# Patient Record
Sex: Male | Born: 1970
Health system: Southern US, Community
[De-identification: ages and names within clinical notes are randomized; demographics above are authoritative.]

## PROBLEM LIST (undated history)

## (undated) ENCOUNTER — Emergency Department (HOSPITAL_COMMUNITY): Admission: EM | Payer: No Typology Code available for payment source | Source: Home / Self Care

## (undated) DIAGNOSIS — F1920 Other psychoactive substance dependence, uncomplicated: Secondary | ICD-10-CM

## (undated) DIAGNOSIS — I10 Essential (primary) hypertension: Secondary | ICD-10-CM

## (undated) DIAGNOSIS — Z8719 Personal history of other diseases of the digestive system: Secondary | ICD-10-CM

## (undated) DIAGNOSIS — K219 Gastro-esophageal reflux disease without esophagitis: Secondary | ICD-10-CM

## (undated) HISTORY — PX: HIP ARTHROPLASTY: SHX981

## (undated) HISTORY — PX: APPENDECTOMY: SHX54

## (undated) SURGERY — CLOSED REDUCTION, HIP
Anesthesia: General | Laterality: Right

---

## 2002-03-02 ENCOUNTER — Emergency Department (HOSPITAL_COMMUNITY): Admission: EM | Admit: 2002-03-02 | Discharge: 2002-03-02 | Payer: Self-pay | Admitting: Emergency Medicine

## 2002-03-30 ENCOUNTER — Emergency Department (HOSPITAL_COMMUNITY): Admission: EM | Admit: 2002-03-30 | Discharge: 2002-03-30 | Payer: Self-pay | Admitting: Emergency Medicine

## 2003-01-08 ENCOUNTER — Inpatient Hospital Stay (HOSPITAL_COMMUNITY): Admission: EM | Admit: 2003-01-08 | Discharge: 2003-01-12 | Payer: Self-pay

## 2003-01-14 ENCOUNTER — Emergency Department (HOSPITAL_COMMUNITY): Admission: EM | Admit: 2003-01-14 | Discharge: 2003-01-14 | Payer: Self-pay

## 2003-01-15 ENCOUNTER — Emergency Department (HOSPITAL_COMMUNITY): Admission: EM | Admit: 2003-01-15 | Discharge: 2003-01-15 | Payer: Self-pay | Admitting: *Deleted

## 2003-01-16 ENCOUNTER — Emergency Department (HOSPITAL_COMMUNITY): Admission: EM | Admit: 2003-01-16 | Discharge: 2003-01-16 | Payer: Self-pay | Admitting: Emergency Medicine

## 2003-01-22 ENCOUNTER — Emergency Department (HOSPITAL_COMMUNITY): Admission: EM | Admit: 2003-01-22 | Discharge: 2003-01-22 | Payer: Self-pay | Admitting: Emergency Medicine

## 2003-02-07 ENCOUNTER — Emergency Department (HOSPITAL_COMMUNITY): Admission: EM | Admit: 2003-02-07 | Discharge: 2003-02-07 | Payer: Self-pay | Admitting: Emergency Medicine

## 2003-03-10 HISTORY — PX: JOINT REPLACEMENT: SHX530

## 2003-03-29 ENCOUNTER — Emergency Department (HOSPITAL_COMMUNITY): Admission: EM | Admit: 2003-03-29 | Discharge: 2003-03-29 | Payer: Self-pay

## 2003-04-01 ENCOUNTER — Emergency Department (HOSPITAL_COMMUNITY): Admission: EM | Admit: 2003-04-01 | Discharge: 2003-04-01 | Payer: Self-pay | Admitting: *Deleted

## 2003-04-07 ENCOUNTER — Emergency Department (HOSPITAL_COMMUNITY): Admission: EM | Admit: 2003-04-07 | Discharge: 2003-04-07 | Payer: Self-pay | Admitting: Emergency Medicine

## 2003-12-05 ENCOUNTER — Inpatient Hospital Stay (HOSPITAL_COMMUNITY): Admission: RE | Admit: 2003-12-05 | Discharge: 2003-12-08 | Payer: Self-pay | Admitting: Orthopedic Surgery

## 2004-01-01 ENCOUNTER — Emergency Department (HOSPITAL_COMMUNITY): Admission: EM | Admit: 2004-01-01 | Discharge: 2004-01-01 | Payer: Self-pay | Admitting: Emergency Medicine

## 2004-01-04 ENCOUNTER — Emergency Department (HOSPITAL_COMMUNITY): Admission: EM | Admit: 2004-01-04 | Discharge: 2004-01-04 | Payer: Self-pay | Admitting: Family Medicine

## 2004-01-08 ENCOUNTER — Emergency Department (HOSPITAL_COMMUNITY): Admission: EM | Admit: 2004-01-08 | Discharge: 2004-01-08 | Payer: Self-pay | Admitting: Emergency Medicine

## 2004-01-15 ENCOUNTER — Ambulatory Visit: Payer: Self-pay | Admitting: Sports Medicine

## 2004-02-12 ENCOUNTER — Ambulatory Visit: Payer: Self-pay | Admitting: Family Medicine

## 2004-02-19 ENCOUNTER — Ambulatory Visit: Payer: Self-pay | Admitting: Sports Medicine

## 2004-03-14 ENCOUNTER — Encounter: Admission: RE | Admit: 2004-03-14 | Discharge: 2004-03-14 | Payer: Self-pay | Admitting: Orthopedic Surgery

## 2004-05-11 ENCOUNTER — Emergency Department (HOSPITAL_COMMUNITY): Admission: EM | Admit: 2004-05-11 | Discharge: 2004-05-11 | Payer: Self-pay | Admitting: Family Medicine

## 2004-05-28 ENCOUNTER — Ambulatory Visit: Payer: Self-pay | Admitting: Sports Medicine

## 2004-06-04 ENCOUNTER — Encounter: Admission: RE | Admit: 2004-06-04 | Discharge: 2004-06-04 | Payer: Self-pay | Admitting: Orthopedic Surgery

## 2004-06-17 ENCOUNTER — Encounter
Admission: RE | Admit: 2004-06-17 | Discharge: 2004-09-15 | Payer: Self-pay | Admitting: Physical Medicine & Rehabilitation

## 2004-06-18 ENCOUNTER — Ambulatory Visit: Payer: Self-pay | Admitting: Physical Medicine & Rehabilitation

## 2004-08-14 ENCOUNTER — Ambulatory Visit: Payer: Self-pay | Admitting: Physical Medicine & Rehabilitation

## 2004-08-20 ENCOUNTER — Ambulatory Visit: Payer: Self-pay | Admitting: Sports Medicine

## 2004-09-03 ENCOUNTER — Encounter: Admission: RE | Admit: 2004-09-03 | Discharge: 2004-09-03 | Payer: Self-pay | Admitting: Orthopedic Surgery

## 2004-09-16 ENCOUNTER — Ambulatory Visit: Payer: Self-pay | Admitting: Physical Medicine & Rehabilitation

## 2004-09-16 ENCOUNTER — Encounter
Admission: RE | Admit: 2004-09-16 | Discharge: 2004-12-15 | Payer: Self-pay | Admitting: Physical Medicine & Rehabilitation

## 2004-09-30 ENCOUNTER — Inpatient Hospital Stay (HOSPITAL_COMMUNITY): Admission: RE | Admit: 2004-09-30 | Discharge: 2004-10-02 | Payer: Self-pay | Admitting: Orthopedic Surgery

## 2004-10-19 ENCOUNTER — Encounter: Admission: RE | Admit: 2004-10-19 | Discharge: 2004-10-19 | Payer: Self-pay | Admitting: Orthopedic Surgery

## 2004-10-30 ENCOUNTER — Ambulatory Visit: Payer: Self-pay | Admitting: Physical Medicine & Rehabilitation

## 2004-12-18 ENCOUNTER — Encounter
Admission: RE | Admit: 2004-12-18 | Discharge: 2005-03-18 | Payer: Self-pay | Admitting: Physical Medicine & Rehabilitation

## 2004-12-18 ENCOUNTER — Ambulatory Visit: Payer: Self-pay | Admitting: Physical Medicine & Rehabilitation

## 2005-03-19 ENCOUNTER — Ambulatory Visit: Payer: Self-pay | Admitting: Physical Medicine & Rehabilitation

## 2005-03-19 ENCOUNTER — Encounter
Admission: RE | Admit: 2005-03-19 | Discharge: 2005-06-17 | Payer: Self-pay | Admitting: Physical Medicine & Rehabilitation

## 2005-06-09 ENCOUNTER — Encounter
Admission: RE | Admit: 2005-06-09 | Discharge: 2005-09-07 | Payer: Self-pay | Admitting: Physical Medicine & Rehabilitation

## 2005-06-17 ENCOUNTER — Ambulatory Visit: Payer: Self-pay | Admitting: Family Medicine

## 2005-06-17 ENCOUNTER — Emergency Department (HOSPITAL_COMMUNITY): Admission: EM | Admit: 2005-06-17 | Discharge: 2005-06-17 | Payer: Self-pay | Admitting: Emergency Medicine

## 2005-06-23 ENCOUNTER — Ambulatory Visit: Payer: Self-pay | Admitting: Physical Medicine & Rehabilitation

## 2005-06-23 ENCOUNTER — Ambulatory Visit: Payer: Self-pay | Admitting: Sports Medicine

## 2005-09-18 ENCOUNTER — Ambulatory Visit: Payer: Self-pay | Admitting: Physical Medicine & Rehabilitation

## 2005-09-18 ENCOUNTER — Encounter
Admission: RE | Admit: 2005-09-18 | Discharge: 2005-12-17 | Payer: Self-pay | Admitting: Physical Medicine & Rehabilitation

## 2005-12-11 ENCOUNTER — Ambulatory Visit: Payer: Self-pay | Admitting: Physical Medicine & Rehabilitation

## 2006-03-05 ENCOUNTER — Encounter
Admission: RE | Admit: 2006-03-05 | Discharge: 2006-06-03 | Payer: Self-pay | Admitting: Physical Medicine & Rehabilitation

## 2006-03-05 ENCOUNTER — Ambulatory Visit: Payer: Self-pay | Admitting: Physical Medicine & Rehabilitation

## 2006-05-06 DIAGNOSIS — K219 Gastro-esophageal reflux disease without esophagitis: Secondary | ICD-10-CM | POA: Insufficient documentation

## 2006-05-07 ENCOUNTER — Ambulatory Visit: Payer: Self-pay | Admitting: Family Medicine

## 2006-05-27 ENCOUNTER — Ambulatory Visit: Payer: Self-pay | Admitting: Physical Medicine & Rehabilitation

## 2006-05-27 ENCOUNTER — Encounter
Admission: RE | Admit: 2006-05-27 | Discharge: 2006-08-25 | Payer: Self-pay | Admitting: Physical Medicine & Rehabilitation

## 2006-07-28 ENCOUNTER — Telehealth: Payer: Self-pay | Admitting: *Deleted

## 2006-08-11 ENCOUNTER — Observation Stay (HOSPITAL_COMMUNITY): Admission: EM | Admit: 2006-08-11 | Discharge: 2006-08-12 | Payer: Self-pay | Admitting: Emergency Medicine

## 2006-10-27 ENCOUNTER — Encounter
Admission: RE | Admit: 2006-10-27 | Discharge: 2006-12-07 | Payer: Self-pay | Admitting: Physical Medicine & Rehabilitation

## 2006-10-27 ENCOUNTER — Ambulatory Visit: Payer: Self-pay | Admitting: Physical Medicine & Rehabilitation

## 2007-02-01 ENCOUNTER — Inpatient Hospital Stay (HOSPITAL_COMMUNITY): Admission: EM | Admit: 2007-02-01 | Discharge: 2007-02-01 | Payer: Self-pay | Admitting: Emergency Medicine

## 2007-03-16 ENCOUNTER — Ambulatory Visit: Payer: Self-pay | Admitting: Physical Medicine & Rehabilitation

## 2007-03-16 ENCOUNTER — Encounter
Admission: RE | Admit: 2007-03-16 | Discharge: 2007-05-23 | Payer: Self-pay | Admitting: Physical Medicine & Rehabilitation

## 2007-03-26 ENCOUNTER — Encounter (INDEPENDENT_AMBULATORY_CARE_PROVIDER_SITE_OTHER): Payer: Self-pay | Admitting: Family Medicine

## 2007-04-20 ENCOUNTER — Telehealth (INDEPENDENT_AMBULATORY_CARE_PROVIDER_SITE_OTHER): Payer: Self-pay | Admitting: Family Medicine

## 2007-04-21 ENCOUNTER — Telehealth: Payer: Self-pay | Admitting: *Deleted

## 2007-04-27 ENCOUNTER — Encounter: Payer: Self-pay | Admitting: *Deleted

## 2007-04-29 ENCOUNTER — Inpatient Hospital Stay (HOSPITAL_COMMUNITY): Admission: EM | Admit: 2007-04-29 | Discharge: 2007-05-07 | Payer: Self-pay | Admitting: Emergency Medicine

## 2007-04-30 ENCOUNTER — Ambulatory Visit: Payer: Self-pay | Admitting: Infectious Disease

## 2007-07-12 ENCOUNTER — Encounter
Admission: RE | Admit: 2007-07-12 | Discharge: 2007-07-15 | Payer: Self-pay | Admitting: Physical Medicine & Rehabilitation

## 2007-07-15 ENCOUNTER — Ambulatory Visit: Payer: Self-pay | Admitting: Physical Medicine & Rehabilitation

## 2007-10-06 ENCOUNTER — Encounter
Admission: RE | Admit: 2007-10-06 | Discharge: 2007-10-10 | Payer: Self-pay | Admitting: Physical Medicine & Rehabilitation

## 2007-10-10 ENCOUNTER — Ambulatory Visit: Payer: Self-pay | Admitting: Physical Medicine & Rehabilitation

## 2007-12-29 ENCOUNTER — Encounter
Admission: RE | Admit: 2007-12-29 | Discharge: 2008-01-02 | Payer: Self-pay | Admitting: Physical Medicine & Rehabilitation

## 2008-01-02 ENCOUNTER — Ambulatory Visit: Payer: Self-pay | Admitting: Physical Medicine & Rehabilitation

## 2008-04-03 ENCOUNTER — Encounter
Admission: RE | Admit: 2008-04-03 | Discharge: 2008-04-18 | Payer: Self-pay | Admitting: Physical Medicine & Rehabilitation

## 2008-04-17 ENCOUNTER — Encounter
Admission: RE | Admit: 2008-04-17 | Discharge: 2008-04-20 | Payer: Self-pay | Admitting: Physical Medicine & Rehabilitation

## 2008-04-20 ENCOUNTER — Ambulatory Visit: Payer: Self-pay | Admitting: Physical Medicine & Rehabilitation

## 2008-07-22 ENCOUNTER — Emergency Department (HOSPITAL_COMMUNITY): Admission: EM | Admit: 2008-07-22 | Discharge: 2008-07-22 | Payer: Self-pay | Admitting: Emergency Medicine

## 2009-07-26 ENCOUNTER — Emergency Department (HOSPITAL_COMMUNITY): Admission: EM | Admit: 2009-07-26 | Discharge: 2009-07-26 | Payer: Self-pay | Admitting: Emergency Medicine

## 2010-04-01 ENCOUNTER — Ambulatory Visit (HOSPITAL_COMMUNITY)
Admission: RE | Admit: 2010-04-01 | Discharge: 2010-04-01 | Payer: Medicare Other | Source: Home / Self Care | Attending: Pulmonary Disease | Admitting: Pulmonary Disease

## 2010-04-08 NOTE — Progress Notes (Signed)
Summary: AFTER HOURS - Requesting BP med refill   Medications Added CATAPRES-TTS-3 0.3 MG/24HR PTWK (CLONIDINE HCL) 1 tablet a day for blood pressure - MUST MAKE AND KEEP APPT WITH DR. Jennette Kettle FOR REFILLS.       Prescriptions: CATAPRES-TTS-3 0.3 MG/24HR PTWK (CLONIDINE HCL) 1 tablet a day for blood pressure - MUST MAKE AND KEEP APPT WITH DR. Jennette Kettle FOR REFILLS.  #5 x 0   Entered and Authorized by:   Drue Dun MD   Signed by:   Drue Dun MD on 04/20/2007   Method used:   Electronically sent to ...       [facility] [address] [phone]*       [id number]       [place], [place]  [address]       Ph: [phone]       Fax: [phone]   RxID:   [id number]    Phone Note Call from Patient Call back at Home Phone [phone]   Caller: Patient Summary of Call: Patient calls at 5:30 pm requesting refill on his clonidine.   States he called the office ealier today, but it was not filled and he has been out since yesterday (do not see documentation of this call in chart).  In review of chart, see that patient has not been seen for at least 1 year.  His medication was refilled once after hours already by Dr. Sandria Manly on 1/17 at which time he was instructed he must make an appointment with Dr. Jennette Kettle prior to any further refills.  Patient acts surprised by this at first and insists he has been seen, then after I informed him I have his chart in front of me, he admits that he remembers Dr. Sandria Manly doing this, and cannot recall his last appt or a future scheduled appt.  He has not yet made an appointment according to [facility].  He is very demanding and persistant and states that he can feel his BP increasing already as he has not had any medication since yesterday.  Given potential for rebound HTN, advised patient I will given him 5 pills, but that he MUST call the office tomorrow and make and appointment with Dr. Jennette Kettle as well has have her approve additional refills beyond the 5  pills I am giving.  Also advised the patient that it is practice policy that we do not give refills after hours and that it is inappropriate for him to continue calling after hours to make such requests.  Patient agrees to call the office during regular hours tomorrow to schedule an appt and request further refills from Dr. Jennette Kettle.  Will forward this message to her for review. Initial call taken by: Drue Dun MD,  April 20, 2007 5:51 PM    New/Updated Medications: CATAPRES-TTS-3 0.3 MG/24HR PTWK (CLONIDINE HCL) 1 tablet a day for blood pressure - MUST MAKE AND KEEP APPT WITH DR. Jennette Kettle FOR REFILLS.   Appended Document: AFTER HOURS - Requesting BP med refill  Patient called back to resident lounge number stating he needs the pills rather than the patch.  Called pharmacy to clarify patient may have tablets instead of patch, but only #5 with NO REFILLS.
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
## 2010-04-08 NOTE — Progress Notes (Signed)
Summary: Referral to new doctor  Phone Note Call from Patient Call back at Home Phone [phone]   Summary of Call: pt is wanting a referral to switch to another dr - pt states he isn't being helped the way he would like to be Initial call taken by: Haydee Salter,  Jul 28, 2006 11:58 AM  Follow-up for Phone Call        called pt. pt was very angry. does not like dr.bassett, because she has not helped him at all. he has problems sleeping. needs to [place] a different doctor, [place] [address]. pt has medicaid. i told pt, that i can not just refer him to a different doctor. if he wants his records, he needs to sign a release of information and we will fax his records. pt was very loud and i told him that i don't appreciate him yelling at me. i told him, that i would fwd. his request to our head nurse Follow-up by: Arlyss Repress CMA,,  Jul 28, 2006 3:26 PM  Additional Follow-up for Phone Call Additional follow up Details #1::        Spoke with patient and explained that Dr. Cleophas Dunker would be leaving in July therefore he would be automatically reassigend to another MD.  Patient agreed to stay at [facility].  He then called back the next day and left a message that he had been in contact with a MD in [place] who has agreed to prescribe him the medicaiton that Dr. Cleophas Dunker was refusing to write refills for (Xanax).  I called him back and told him this was okay with [place] and that we would photocopy his medical record for him to take to his new MD.  I asked him to call me back to let me know when he would be dropping by to pick it up. I have not yet heard back from him. Additional Follow-up by: Dennison Nancy RN,  Aug 01, 2006 8:15 PM

## 2010-04-08 NOTE — Miscellaneous (Signed)
Summary: OV/DNKA  Clinical Lists Changes 

## 2010-04-08 NOTE — Miscellaneous (Signed)
Summary: out of meds  Medications Added CATAPRES-TTS-3 0.3 MG/24HR PTWK (CLONIDINE HCL) 1 tablet a day for blood, must make appt with PCP Dr. Jennette Kettle prior to any further refills.       Clinical Lists Changes  Medications: Added new medication of CATAPRES-TTS-3 0.3 MG/24HR PTWK (CLONIDINE HCL) 1 tablet a day for blood, must make appt with PCP Dr. Jennette Kettle prior to any further refills. - Signed Rx of CATAPRES-TTS-3 0.3 MG/24HR PTWK (CLONIDINE HCL) 1 tablet a day for blood, must make appt with PCP Dr. Jennette Kettle prior to any further refills.;  #30 x 0;  Signed;  Entered by: Wilhemina Bonito  MD;  Authorized by: Wilhemina Bonito  MD;  Method used: Electronic   Pt called stating that his Blood pressure medication as well as other medications are now missing.  Pt states he came home from work, and noticed that his top drawer where he keeps his meds, was open, he is missing $500, his Catapress 0.3mg , and other medications that he can't remember by name.  Pt is calling for a refill of his BP medication because he knows that he needs to take it every day.  Explained to pt that he needs to make an appt to see his new pcp, (Dr. Jennette Kettle), prior to any further refills.  Pt was formerly a pt of Dr. Hoy Register and has not been seen since she was at the practice.  Pt agrees with the plan.  [facility] Prescriptions: CATAPRES-TTS-3 0.3 MG/24HR PTWK (CLONIDINE HCL) 1 tablet a day for blood, must make appt with PCP Dr. Jennette Kettle prior to any further refills.  #30 x 0   Entered and Authorized by:   Wilhemina Bonito  MD   Signed by:   Wilhemina Bonito  MD on 03/26/2007   Method used:   Electronically sent to ...       [facility] [address] [phone]*       [address]       [place], [place]  [address]       Ph: [phone]       Fax: [phone]   RxID:   [id number]

## 2010-04-09 ENCOUNTER — Encounter: Payer: Self-pay | Admitting: Pulmonary Disease

## 2010-04-27 ENCOUNTER — Encounter: Payer: Self-pay | Admitting: *Deleted

## 2010-05-04 ENCOUNTER — Emergency Department (HOSPITAL_COMMUNITY)
Admission: EM | Admit: 2010-05-04 | Discharge: 2010-05-04 | Disposition: A | Discharge status: Home / Self Care | Payer: Medicare HMO | Attending: Emergency Medicine | Admitting: Emergency Medicine

## 2010-05-04 ENCOUNTER — Emergency Department (HOSPITAL_COMMUNITY): Payer: Medicare HMO

## 2010-05-04 DIAGNOSIS — IMO0002 Reserved for concepts with insufficient information to code with codable children: Secondary | ICD-10-CM | POA: Insufficient documentation

## 2010-05-04 DIAGNOSIS — Z96649 Presence of unspecified artificial hip joint: Secondary | ICD-10-CM | POA: Insufficient documentation

## 2010-05-04 DIAGNOSIS — S20229A Contusion of unspecified back wall of thorax, initial encounter: Secondary | ICD-10-CM | POA: Insufficient documentation

## 2010-05-04 DIAGNOSIS — Z96659 Presence of unspecified artificial knee joint: Secondary | ICD-10-CM | POA: Insufficient documentation

## 2010-05-04 DIAGNOSIS — Y92009 Unspecified place in unspecified non-institutional (private) residence as the place of occurrence of the external cause: Secondary | ICD-10-CM | POA: Insufficient documentation

## 2010-05-04 DIAGNOSIS — I10 Essential (primary) hypertension: Secondary | ICD-10-CM | POA: Insufficient documentation

## 2010-05-04 DIAGNOSIS — S6390XA Sprain of unspecified part of unspecified wrist and hand, initial encounter: Secondary | ICD-10-CM | POA: Insufficient documentation

## 2010-05-04 DIAGNOSIS — M533 Sacrococcygeal disorders, not elsewhere classified: Secondary | ICD-10-CM | POA: Insufficient documentation

## 2010-05-04 DIAGNOSIS — W108XXA Fall (on) (from) other stairs and steps, initial encounter: Secondary | ICD-10-CM | POA: Insufficient documentation

## 2010-05-14 ENCOUNTER — Emergency Department (HOSPITAL_COMMUNITY)
Admission: EM | Admit: 2010-05-14 | Discharge: 2010-05-14 | Disposition: A | Discharge status: Home / Self Care | Payer: Medicare Other | Attending: Emergency Medicine | Admitting: Emergency Medicine

## 2010-05-14 ENCOUNTER — Emergency Department (HOSPITAL_COMMUNITY): Payer: Medicare Other

## 2010-05-14 DIAGNOSIS — S82009A Unspecified fracture of unspecified patella, initial encounter for closed fracture: Secondary | ICD-10-CM | POA: Insufficient documentation

## 2010-05-14 DIAGNOSIS — I10 Essential (primary) hypertension: Secondary | ICD-10-CM | POA: Insufficient documentation

## 2010-05-14 DIAGNOSIS — M25569 Pain in unspecified knee: Secondary | ICD-10-CM | POA: Insufficient documentation

## 2010-05-19 ENCOUNTER — Other Ambulatory Visit: Payer: Self-pay | Admitting: Family Medicine

## 2010-05-19 ENCOUNTER — Ambulatory Visit
Admission: RE | Admit: 2010-05-19 | Discharge: 2010-05-19 | Disposition: A | Discharge status: Home / Self Care | Payer: Medicare Other | Source: Ambulatory Visit | Attending: Family Medicine | Admitting: Family Medicine

## 2010-05-19 DIAGNOSIS — R609 Edema, unspecified: Secondary | ICD-10-CM
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
## 2010-07-22 NOTE — Op Note (Signed)
NAME:  Todd [name], Todd [name] NO.:  [id number]   MEDICAL RECORD NO.:  [id number]          PATIENT TYPE:  OBV   LOCATION:  0114                         FACILITY:  [facility]   PHYSICIAN:  Mark C. Ophelia Charter, M.D.    DATE OF BIRTH:  January 03, 1971   DATE OF PROCEDURE:  08/11/2006  DATE OF DISCHARGE:                               OPERATIVE REPORT   PRE AND POSTOPERATIVE DIAGNOSIS:  Status post right total hip  arthroplasty revision dislocation.   PROCEDURE:  Closed reduction under anesthesia.   SURGEON:  Annell Greening, MD.   This 40 year old male was sitting on a toilet clipping his toenails had  his knee flexed to his chest, internally rotated, he was trying to clip  small toenails and suddenly felt sharp pain in his hip when he  dislocated.  This hip was maximally flexed with knee to chest internally  rotated.  X-rays in the emergency room demonstrated closed hip  dislocation.  He has a long stem with a lateral strut graft with cables.  No evidence of subsidence or fracture.   PROCEDURE:  At induction of general anesthesia and succinylcholine with  the patient being masked, I got up on the table, put with his leg in 90-  90 position, pulled, did not feel any particular type of clunk.  Internal, external rotation gently was performed.  The leg was then  brought out and distracted some and no clunk was noted.  Leg was brought  all the way out to full extension.  It was noted that the leg lengths  were equal.  Hip easily internally externally rotated.  There was no  shortening.  It was apparent that it probably easily slid in as it was  being placed in 90-90 position.  He was flexed up 90 with some external  rotation a little posterior pressure, hip appeared stable at this point.  It was brought back out to full extension and an x-ray was taken.  X-ray  showed the hip was reduced.  He is placed in knee immobilizer which he  will keep on at all times x 6 weeks unless otherwise instructed  by Dr.  August Saucer.  He will be discharged home office follow-up one week with Dr.  August Saucer.      Mark C. Ophelia Charter, M.D.  Electronically Signed     MCY/MEDQ  D:  08/11/2006  T:  08/12/2006  Job:  [id number]

## 2010-07-22 NOTE — Op Note (Signed)
NAME:  Todd [name], Todd [name] NO.:  [id number]   MEDICAL RECORD NO.:  [id number]          PATIENT TYPE:  INP   LOCATION:  0098                         FACILITY:  [facility]   PHYSICIAN:  Mark C. Ophelia Charter, M.D.    DATE OF BIRTH:  1970/12/22   DATE OF PROCEDURE:  02/01/2007  DATE OF DISCHARGE:  02/01/2007                               OPERATIVE REPORT   PREOPERATIVE DIAGNOSES:  Right closed total hip arthroplasty  dislocation.   POSTOPERATIVE DIAGNOSES:  Right closed total hip arthroplasty  dislocation.   PROCEDURE:  Closed reduction under anesthesia.   SURGEON:  Annell Greening, MD.   ANESTHESIA:  General mask.   ESTIMATED BLOOD LOSS:  None.   This 40 year old male who had total hip arthroplasty done by Dr. Turner Daniels  later had distal bone graft done by Dr. August Saucer and ___dislocated_______  in the past when he was clipping his toenails,  spray this ankle and has  his leg up on a table, stuck a mirror down so he could look at the  lateral ecchymosis of his ankle with flexion at the hip and some  adduction and dislocated the hip once again.  He was n.p.o. since last  night.   PROCEDURE:  After induction of general anesthesia with a muscle  relaxant, the patient is being positioned__________  and with hip in the  90/90 position, patient spine, counter traction applied to the pelvis,  gentle distraction and the hip popped back into position.  There was  stability at 90/90. The hip was brought out into extension, knee  immobilizer applied and a portable x-ray confirmed that the hip was  located.  The patient was never intubated. He was taken to the recovery  room. He will have ankle ASO applied to his ankle for as ankle sprain  and office follow-up in 1 week. The patient was not admitted.      Mark C. Ophelia Charter, M.D.  Electronically Signed     MCY/MEDQ  D:  02/01/2007  T:  02/02/2007  Job:  [id number]

## 2010-07-22 NOTE — Assessment & Plan Note (Signed)
HISTORY OF PRESENT ILLNESS:  [name] returns to clinic today for  followup evaluation.  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  He did suffer a recent hip  dislocation February 01, 2007.  That was placed back into the socket by  Dr. Ophelia Charter in the emergency room.  His primary orthopaedic surgeon Dr.  August Saucer is still considering repeat surgery to try to avoid these repeated  hip dislocations.  He has had two in 2008 with the last one being noted  above.   The patient reports that when he did have his hip dislocation he was  placed on Percocet 10/325 and was using up to four per day.  He reports  that was helping more like his hydrocodone was when he was initially  starting the hydrocodone.  He felt that he gets less benefit from the  hydrocodone the more that he takes it.  He would like to try the  Percocet in place of the hydrocodone if at all possible.   MEDICATIONS:  1. Hydrocodone 10/325 one tablet q.i.d. p.r.n.  2. Toprol XL 25 mg daily.  3. Ultram 50 mg 1-2 tablets p.o. t.i.d. p.r.n. (used occasionally.)  4. Prevacid p.r.n.   REVIEW OF SYSTEMS:  Noncontributory.   PHYSICAL EXAMINATION:  GENERAL APPEARANCE:  Well-appearing, fit, adult  male in mild acute discomfort.  VITAL SIGNS:  Blood pressure 140/64, pulse 61, respiratory rate 18, O2  saturation 98% on room air.  MUSCULOSKELETAL:  He has 5/5 strength throughout the bilateral upper  extremities and 4+/5 strength in the right lower extremity and 5-/5  strength in the left lower extremity.   IMPRESSION:  1. Persistent right thigh pain, status post mid femur fracture with      subsequent bone grafting.  2. History of right total hip replacement with removal of metal      shavings from the right knee August 2006.  3. Repeated hip dislocations per patient.   In the office today, we did decide to switch him to the Percocet.  He  has had a recent refill on the hydrocodone and will  continue to use that  five times per day until he runs out approximately April 08, 2007.  He  then can get the Percocet filled 10/325 one tablet q.i.d. p.r.n. in  place of the hydrocodone since he will be out at that point.  No refills  on the Ultram is necessary in the office today.  Will plan on seeing the  patient in follow up in approximately three to four months time with  refills prior to that appointment as necessary.  He and the surgeon  continue to discuss possible repeat hip surgery.           ______________________________  Ellwood Dense, M.D.     DC/MedQ  D:  03/17/2007 11:47:34  T:  03/17/2007 12:24:06  Job #:  [id number]
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
## 2010-07-22 NOTE — Assessment & Plan Note (Signed)
Todd [name] returns to the clinic today for followup evaluation.  He  reports that he continues to get good benefit from the Ultram and  Percocet used rotating throughout the day.  He does need refills on each  of those as of January 16, 2008.  He continues to do a lot work out of  town up in [place] and in [place] on a periodic basis.  He is back in  town for today's appointment.   MEDICATIONS:  1. Percocet 10/325 one tablet q.i.d. p.r.n.  2. Toprol-XL 50 mg b.i.d.  3. Ultram 50 mg 1-2 tablets p.o. t.i.d. p.r.n.   REVIEW OF SYSTEMS:  Positive for high blood sugar.   PHYSICAL EXAMINATION:  GENERAL:  Well-appearing, middle-aged adult male  in mild-to-no acute discomfort.  VITAL SIGNS:  Blood pressure 136/88 with a pulse of 67, respiratory rate  18, and O2 saturation 100% on room air.  EXTREMITIES:  He ambulates without any assistive device.  He has 5-/5  strength throughout the bilateral upper and lower extremities.   IMPRESSION:  1. Persistent right thigh pain, status post right femur fracture with      subsequent bone grafting.  2. History of right total hip replacement with removal of metal      shavings from the right knee in August 2006.  3. Repeat hip dislocation per the patient.   In the office today, we did refill the patient's Ultram and Percocet as  of January 16, 2008.  We will plan on seeing the patient in followup in  approximately 3 months' time with refills prior to that appointment as  necessary.           ______________________________  Ellwood Dense, M.D.     DC/MedQ  D:  01/02/2008 12:24:34  T:  01/02/2008 22:58:07  Job #:  [id number]
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
## 2010-07-22 NOTE — Assessment & Plan Note (Signed)
Todd [name] returns to clinic today for followup evaluation.  He reports  that he is getting good relief from the Percocet and Ultram use  periodically.  He does need a refill on his Percocet, but has a refill  on the Ultram still remaining.  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  He is unable to work with just one secondary to the economic decline.   MEDICATIONS:  1. Percocet 10/325 one tablet q.i.d. p.r.n.  2. Toprol-XL 50 mg b.i.d.  3. Ultram 50 mg 1-2 tablets p.o. t.i.d. p.r.n.  4. Prevacid p.r.n.   REVIEW OF SYSTEMS:  Positive for low blood sugar.   PHYSICAL EXAMINATION:  GENERAL:  Well-appearing, middle-aged adult male  in mild acute discomfort.  VITAL SIGNS:  Blood pressure 149/80 with a pulse of 67, respiratory rate  22, and O2 saturation 97% on room air.  MUSCULOSKELETAL:  He ambulates without any assist device.  He has 5-/5  strength throughout the bilateral upper and lower extremities.   IMPRESSION:  1. Persistent right thigh pain, status post right femur fracture with      subsequent bone grafting.  2. History of right total hip replacement with removal of metal      shavings from the right knee in August 2006.  3. Repeated hip disk location per the patient.   In the office today, we did refill the patient's Percocet as of October 23, 2007.  No refill on the Ultram is necessary at this time.  We will  plan on seeing him in followup at approximately 3-4 months' time with  refills prior to that appointment as necessary.           ______________________________  Ellwood Dense, M.D.     DC/MedQ  D:  10/10/2007 14:05:16  T:  10/11/2007 05:48:30  Job #:  [id number]
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
## 2010-07-25 NOTE — Assessment & Plan Note (Signed)
INTERVAL HISTORY:  Mr. Kittel returns to clinic today for followup  evaluation.  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  That requires him to get up on a  ladder and be above the ground relying on the strength of his legs.  He has  noted that when he is doing that work along with some general labor that he  occasionally has to do at the work site that he has some increased pain in  the right leg.  He has been restarted on the hydrocodone and is generally  taking approximately three tablets per day.  He is not noting much  improvement with the Ultram that he had been using previously when he was  not working outside the home.  He would like to stop the Ultram and consider  an increase on this hydrocodone.   The patient has been released by Dr. August Saucer with followup on an as-needed  basis.  The patient continues to use his bone stimulator on his right leg  approximately an hour or two in the morning and an hour or two in the  evening.   MEDICATIONS:  1.  Hydrocodone 10/325 one tablet t.i.d. p.r.n. (approximately three per      day).  2.  Toprol XL 25 mg daily.  3.  Ultram 50 mg one to three tablets p.o. t.i.d. p.r.n.   REVIEW OF SYSTEMS:  Noncontributory.   PHYSICAL EXAMINATION:  Well-appearing adult male in mild acute discomfort.  Blood pressure 140/78 with a pulse of 75, respiratory rate 16, and O2  saturation 98% on room air.  He has 5/5 strength throughout the bilateral  upper extremities and 5-/5 strength throughout the right lower extremity.  Left lower extremity strength was 5/5.   IMPRESSION:  1.  Persistent right thigh pain, status post mid femur fracture and      subsequent bone grafting.  2.  History of recent right total hip replacement without difficulty.  3.  Status post removal of metal shavings from the right knee August 2006.   In the office today, we did not refill his Ultram as he does not feel he is  getting any benefit.  We have allowed him to go up on hydrocodone to one  tablet q.i.d.  He has sufficient supply at the present as he just had that  refilled on March 12, 2005.  We will plan on refilling the hydrocodone at  a q.i.d. dosing as he gets closer to the end of that prescription.  We will  plan on seeing the patient in followup in approximately 3 months' time with  refill of medication prior to that appointment as necessary.           ______________________________  Ellwood Dense, M.D.     DC/MedQ  D:  03/20/2005 10:10:06  T:  03/20/2005 21:43:25  Job #:  [id number]

## 2010-07-25 NOTE — Op Note (Signed)
NAME:  JONCARLOS, ATKISON NO.:  [id number]   MEDICAL RECORD NO.:  [id number]          PATIENT TYPE:  INP   LOCATION:  5012                         FACILITY:  [facility]   PHYSICIAN:  Burnard Bunting, M.D.    DATE OF BIRTH:  08/02/1970   DATE OF PROCEDURE:  09/30/2004  DATE OF DISCHARGE:                                 OPERATIVE REPORT   PREOPERATIVE DIAGNOSIS:  Right femoral shaft fracture nonunion.   POSTOPERATIVE DIAGNOSIS:  Right femoral shaft fracture nonunion.   PROCEDURE:  Right femoral shaft nonunion takedown with Symphony bone  grafting and cable grafting.   SURGEON:  Burnard Bunting, MD.   ASSISTANT:  Vanita Panda. Magnus Ivan, MD.   ESTIMATED BLOOD LOSS:  250.   DRAINS:  Hemovac x1.   CULTURES OBTAINED:  One at the nonunion site.   PROCEDURE IN DETAIL:  The patient was brought to the operating room where a  general endotracheal anesthesia was induced.  The patient was placed in the  lateral decubitus position with the left axilla and the left peroneal never  well padded.  The right leg, femur, hip, and foot were prepped with DuraPrep  solution and draped in a sterile manner, and Ioban was used to cover the  operative field.  A lateral approach to the femur was utilized, and the skin  and subcutaneous tissues were sharply divided, the fascia lata was divided,  the vastus lateralis was then mobilized anteriorly, and using a combination  of the electrocautery and blunt dissection, the muscle was elevated off of  the nonunion site.  One bleeding vein in the crossing of the intermuscular  septum required vein ligation.  This was ligated across the fascia with  clips.  Using an osteotome and curettes, the fibrous tissue within the  nonunion site was debrided.  Bleeding bony surfaces were obtained.  Micromotion was detected at the nonunion site.  At this time, the incision  was thoroughly irrigated, the Symphony bone graft was placed at the nonunion  site, the  femoral cortical strut graft was placed with two cables proximal  and two cables distal to the nonunion site.  The cables were tightened.  Fluoroscopic guidance was used to confirm good placement of the cable and  grafting.  At this time, after placement of the bone graft, a platelet-rich  plasma was sprayed on the bone site.  A Hemovac drain was placed but not  connected.  The fascia lata was then reapproximated using a #1 Vicryl  suture.  The skin was reapproximated using interrupted inverted 0 Vicryl  suture and 2-0 Vicryl sutures and skin staples, and a pervious dressing was  placed.  The patient tolerated the procedure well without any immediate  complication.      GSD/MEDQ  D:  09/30/2004  T:  09/30/2004  Job:  [id number]
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
## 2010-07-25 NOTE — Assessment & Plan Note (Signed)
HISTORY OF PRESENT ILLNESS:  [name] returns to the clinic today for  follow up evaluation.  He reports that overall he has been doing reasonably  well.  He was seen by Dr. August Saucer just this week and released back to work as  of December 17, 2004.  He has actually returned to work as of this morning.  He reports that Dr. August Saucer has told him that the bone has healed completely on  the lateral aspect of his right femur, but there is still some sign of the  old fracture present from the medial aspect.  He has asked the patient to  use a bone stimulator on that area twice a day.  The patient has also had  metal shavings removed from his right knee approximately November 03, 2004.  He is still having some pain there and was told that there is nothing else  that the Orthopedist can do for that.  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   The patient has been using his Ultram 50 mg two tablets three times per day.  He did not need a refill in the office today.  He has run out of his  hydrocodone and asked about a refill on that to be used on an as needed  basis.   MEDICATIONS:  1.  Hydrocodone 10/325 one tablet q.i.d. p.r.n. (not using at present).  2.  Toprol XL 25 mg daily.  3.  Ultram 50 mg two tablet p.o. t.i.d. p.r.n.   REVIEW OF SYSTEMS:  Noncontributory.   PHYSICAL EXAMINATION:  GENERAL APPEARANCE:  Well-appearing, fit, adult male  in mild acute discomfort involving his right knee.  VITAL SIGNS:  Blood pressure 136/81, pulse 61, respirations 16, O2  saturation 96% on room air.  EXTREMITIES:  He has 5-/5 strength throughout the bilateral upper and lower  extremities.  Bulk and tone were normal.  Reflexes were 2+ and symmetrical.   On examination of his right knee, he had mild crepitus, though improved with  range of motion.   IMPRESSION:  1.  Persistent right thigh pain, status post mid femur fracture and      subsequent bone grafting.  2.  History of recent  right total hip replacement without difficulty.  3.  Status post removal of metal shavings from right knee joint November 03, 2004.   PLAN:  In the office today, we did refill his hydrocodone at 10/325 one  tablet p.o. t.i.d. p.r.n.  He has sufficient Ultram at this time.  We will  plan on seeing him in follow up in approximately three months time.  He has  been told that he could follow up with Dr. August Saucer on an as needed basis at  this point.           ______________________________  Ellwood Dense, M.D.     DC/MedQ  D:  12/19/2004 10:14:15  T:  12/19/2004 11:39:59  Job #:  [id number]

## 2010-07-25 NOTE — Assessment & Plan Note (Signed)
HISTORY OF PRESENT ILLNESS:  Todd [name] returns to the clinic today for  follow up evaluation.  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  He reports that his employer allows him  to make adjustments secondary to his persistent knee pain.  He is due for  follow up CT scan with Dr. August Saucer and follow up office visit in early July.   The patient has been using his Hydrocodone approximately 0-3 tablets per  day.  He tries to use the Tramadol when he is working so that he is not  lethargic.  He used the Tramadol usually 6 tablets per day.  He needs  refills on both in the office today.   MEDICATIONS:  1.  Hydrocodone 10/325 one tablet t.i.d. p.r.n.  2.  Ultram 50 mg 1-2 tablets p.o. t.i.d. p.r.n.  3.  Toprol XL 25 mg daily.   PHYSICAL EXAMINATION:  GENERAL APPEARANCE:  Well-appearing, fit, adult male  in mild acute discomfort.  VITAL SIGNS:  Blood pressure 162/95 with pulse 79, respirations 16, O2  saturation 97% on room air.  NEUROLOGICAL:  There is 5-/5 strength throughout the left lower extremity  and 4/5 strength throughout the right lower extremity.  Upper extremity  strength was 5/5 throughout.  Bulk and tone were normal.  Reflexes 2+ and  symmetric.   IMPRESSION:  1.  Persistent chronic right thigh pain status post slow healing mid femur      fracture.  2.  History of recent right total hip replacement without difficulty.   PLAN:  In the office today, we did refill the patient's hydrocodone and  Ultram medications.  We have asked for a repeat urine drug screen on him in  the office today as his urine drug screen from Jul 18, 2004, came back  positive for marijuana.  He reports that he has quit marijuana at this  point.  We will see what the urine drug screen looks like today.  We will  plan on seeing him in follow up in the second week of July 2006.       DC/MedQ  D:  08/15/2004 11:46:35  T:  08/15/2004 15:25:01  Job #:  [id number]
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
## 2010-07-25 NOTE — Assessment & Plan Note (Signed)
MEDICAL RECORD NUMBER:  [id number]   DATE OF BIRTH:  Sep 24, 1970   DATE OF EVALUATION:  Jul 18, 2004   INTERVAL HISTORY:  Todd [name] returns to clinic today for followup  evaluation.  He is a 40 year old adult male referred to this office by Dr.  August Saucer for evaluation and treatment of chronic right thigh pain after a mid-  femur fracture.  The patient reports that he is doing much better recently.  He continues to use his bone stimulator twice a day for approximately 20  minutes each and that has helped substantially.  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  He is using his  tramadol 50 mg one to two tablets p.o. t.i.d. p.r.n. and also uses  hydrocodone 10/325 one tablet t.i.d. p.r.n.  He reports that he would like  to decrease that down to twice a day on the hydrocodone and see how he  responds.  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  He is due for followup with Dr. August Saucer in July 2006.  He  presently is using only a single point cane for ambulation.   MEDICATIONS:  1.  Hydrocodone 10/325 one tablet t.i.d. p.r.n.  2.  Ultram 50 mg one to two tablets p.o. t.i.d. p.r.n.  3.  Toprol XL 25 mg once daily.   PHYSICAL EXAMINATION:  Well-appearing fit adult male in mild to no acute  discomfort.  Blood pressure 164/93 with a pulse of 97, respiratory rate 16  and O2 saturation 98% on room air.  He has 5/5 strength throughout the  bilateral upper extremities.  Right lower extremity strength was generally  4+/5.  He does have a limp on the right side during ambulation with or  without the single point cane.   IMPRESSION:  1.  Persistent chronic right thigh pain, status post slow healing mid-femur      fracture.  2.  History of recent right total hip  replacement without difficulty.   In the office today, we did refill his hydrocodone 10/325 but decreased it  down to one tablet p.o. b.i.d. p.r.n. a total of (#60) were prescribed.  No  refill on the tramadol is necessary at this time.  He will call in for a  refill there.  We will plan on seeing the patient in followup in  approximately 4-5 weeks' time.      DC/MedQ  D:  07/18/2004 14:12:04  T:  07/19/2004 22:55:28  Job #:  [id number]
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
## 2010-07-25 NOTE — Assessment & Plan Note (Signed)
REASON FOR VISIT:  Mr. Primm returns to the clinic today for followup  evaluation.  He has switched jobs.  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  He is very busy  between these two jobs.  He continues to get relief from his hydrocodone  use, approximately 4 tablets per day.  He does need a refill on that in the  office today.  He has a sufficient supply of Ultram at this time and will  call in for a refill.   MEDICATIONS:  1. Hydrocodone 10/325 one tablet q.i.d. p.r.n. (approximately #4 per day).  2. Toprol XL 25 mg daily.  3. Ultram 50 mg one to two tablets p.o. t.i.d. p.r.n.   REVIEW OF SYSTEMS:  Noncontributory.   PHYSICAL EXAMINATION:  GENERAL APPEARANCE:  Well-appearing, fit, well-  nourished adult male in mild to no acute discomfort.  VITAL SIGNS:  Blood pressure 143/77, pulse 63, respiratory rate 17 and  oxygen saturation 97% on room air.  MUSCULOSKELETAL:  He has 5/5 strength throughout the bilateral upper and  lower extremities.  He ambulates without any assistive devices.  He  complains of most of the pain being in his right thigh and mid femur region.   IMPRESSION:  1. Persistent right thigh pain, status post mid femur fracture with      subsequent bone grafting.  2. History of recent right total hip replacement.  3. Status post removal of metal shavings from the right knee, August 2006.   PLAN:  In the office today we did refill the patient's hydrocodone.  He will  be calling in for a refill on his Ultram.  We will plan on seeing him in  followup in approximately 3 months time with refills of medications prior to  that appointment as necessary.           ______________________________  Ellwood Dense, M.D.     DC/MedQ  D:  12/14/2005 10:15:38  T:  12/15/2005 10:05:35  Job #:  [id number]

## 2010-07-25 NOTE — Assessment & Plan Note (Signed)
DATE OF EVALUATION:  September 17, 2004.   MEDICAL RECORD NUMBER:  [id number].   [name] returns to the clinic today for followup evaluation.  He is  reporting that he has been back to see Dr. August Saucer last week.  Apparently a CT  scan showed no substantial improvement despite using the bone stimulator for  his right lower extremity.  He and Dr. August Saucer apparently have come to the  conclusion that there has been some healing over of the prior fracture, but  no fusion.  The patient reports that Dr. August Saucer is interested in going back in  and doing bone graft surgery possibly in late July or early August of 2006.  The patient reports that he does take his hydrocodone approximately twice to  three times per day.  He is also working both a full-time and part-time job  at the present time.  He needs a refill of both his hydrocodone and his  Ultram at this point.   MEDICATIONS:  1.  Hydrocodone 10/325 mg one tablet t.i.d. p.r.n.  2.  Ultram 50 mg one to two tablets p.o. t.i.d. p.r.n.  3.  Toprol XL 25 mg daily.   PHYSICAL EXAMINATION:  GENERAL APPEARANCE:  A well-appearing, fit, adult  male in mild acute discomfort.  VITAL SIGNS:  Blood pressure 135/87 with a pulse of 81, respiratory rate 16  and O2 saturation 98% on room air.  EXTREMITIES:  He has 5-/5 strength throughout the left lower extremity and  4/5 throughout the right lower extremity.  Upper extremity strength was 5/5.   IMPRESSION:  1.  Persistent chronic right thigh pain, status post slow healing mid femur      fracture.  2.  History of recent right total hip replacement without difficulty.   In the office today, we did refill the patient's Ultram and hydrocodone.  We  plan on seeing the patient in early September of 2006 after his anticipated  surgery in early August of 2006.  We have asked the patient to have Dr. August Saucer  contact us if other pain medicines are ordered by him after his planned  surgery.       DC/MedQ  D:  09/17/2004  13:09:02  T:  09/17/2004 16:05:34  Job #:  [id number]

## 2010-07-25 NOTE — Assessment & Plan Note (Signed)
[name] returns to the clinic today for follow up evaluation. He reports  that he has had recent bone grafting with Dr. August Saucer, September 30, 2004. He was to  stay on crutches for 1 month and that makes today his 10-month anniversary.  He has stopped crutches today. He also reports that Dr. August Saucer plans an  arthroscopic surgery for his right knee for a possible bone chip removal.  That is scheduled for the next month or so.   At the time of his surgery the patient was treated with a morphine drip and  then placed on OxyContin by Dr. August Saucer. He was on that for about 2 weeks and  then has resumed taking his Hydrocodone which is the medication that we have  him on. Dr. August Saucer apparently gave him a total of #40 tablets as of October 22, 2004, and the patient has been taking one tablet x4 daily. He has been using  that medication but has not been using the Ultram and actually his prior  prescription for the Hydrocodone was x3 per day. There is an increase from  the x3 to x4 daily but he is not using any Ultram at this point.  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 He is not sure  when he will be able to return to that given that he has an upcoming knee  surgery for the chip as mentioned above.   MEDICATIONS:  1.  Hydrocodone 10/325 one tablet q.i.d. p.r.n.  2.  Toprol XL 25 mg daily.   REVIEW OF SYSTEMS:  Noncontributory.   PHYSICAL EXAMINATION:  Well-appearing, fit adult male in mild acute  discomfort. He has a well-healed scar on his right lateral thigh where  recent bone grafting was done. Blood pressure was 129/102 with a pulse of  80, respiratory rate 16 and O2 saturation 99% on room air.   He has 5 minus/5 strength throughout the left lower extremity and 4/5  strength throughout the right lower extremity. Upper extremity strength was  5/5.   IMPRESSION:  1.  Persistent right thigh pain, status post  mid femur fracture and      subsequent bone grafting.  2.  History of recent right total hip replacement without difficulty.   In the office today we did refill his Hydrocodone 10/325 one tablet p.o.  q.i.d. p.r.n. a  total of #120 with no refills. Will plan on seeing him in follow up in  approximately 4 to 6 weeks after his planned arthroscopic surgery in the  next month or so.           ______________________________  Ellwood Dense, M.D.     DC/MedQ  D:  10/31/2004 14:43:18  T:  10/31/2004 20:55:05  Job #:  [id number]

## 2010-07-25 NOTE — Discharge Summary (Signed)
NAME:  Todd [name], Todd [name] NO.:  [id number]   MEDICAL RECORD NO.:  [id number]          PATIENT TYPE:  INP   LOCATION:  1304                         FACILITY:  [facility]   PHYSICIAN:  Michaelyn Barter, M.D. DATE OF BIRTH:  November 16, 1970   DATE OF ADMISSION:  04/29/2007  DATE OF DISCHARGE:  05/07/2007                               DISCHARGE SUMMARY   PRIMARY CARE DOCTOR:  Unassigned.   FINAL DIAGNOSES:  1. Stevens-Johnson syndrome.  2. Hypertension.  3. Constipation.   CONSULTATIONS:  1. Dermatology.  2. Infectious disease, Dr. Paulette Blanch Dam.  3. Dermatology, Dr. Donzetta Starch.   HISTORY OF PRESENT ILLNESS:  Todd [name] is a 40 year old gentleman who  had recently been switched from his usual antihypertensive medication of  Toprol XL about four months previously.  He had recently been taking  clonidine and approximately a week ago he ran out of his clonidine.  Approximately four days later he woke up with a swollen tongue and a  progressing rash involving both of his lower extremities and his  genitals.  [facility].  His clonidine was  discontinued.  He was started on a Medrol tapering Dosepak.  His  symptoms, however, worsened and he decided to come to the hospital for  further evaluation.   PAST MEDICAL HISTORY:  Please see that dictated by Dr. Isidor Holts.   HOSPITAL COURSE:  1. Stevens-Johnson syndrome:  IV Solu-Medrol was provided to the      patient as well as Pepcid.  Because of the pain, the patient was      placed on a Dilaudid PCA pump.  Dermatology was consulted.  Dr.      Yetta Barre saw the patient.  He indicated that the patient was suffering      from erythema multiform likely secondary to clonidine.  He also      indicated that the patient had appeared to respond well to the IV      steroids and stated that a relatively small surface area of      involvement was noted.  In this situation, patients typically do      best with a  slow prednisone taper over four weeks.  After Solu-      Medrol was discontinued, he recommended that the topical care      continue with regards to the patient's lips in addition to Vaseline      gauze.  The skin lesions appeared to be resolving, therefore he      indicated that no additional specific care needed to take place.      Infectious disease was also consulted.  Dr. Daiva Eves saw the      patient.  He also indicated that the patient's condition appeared      to be improving.  He recommended that no prophylactic antibiotics      be given but stated that he would have a low threshold to start      them if he gets worse.  No further recommendations from infectious  disease were given.   Over the course of his hospitalization, the patient indicated that he  was feeling significantly better.  The areas of skin involvement over  his lips and his lower extremities became less painful and the skin  began to clear.  Likewise, the patient's genitals, in particular his  penis, also show some improvement with regard to skin lesions and by the  day of discharge, the patient indicated that he felt significantly  better.  He requested be discharged home.   On the date of discharge, the patient's temperature was 98.1, his heart  rate 62, respirations 18, blood pressure 136/85, O2 saturations 96% on 2  liters.   DISCHARGE MEDICATIONS:  The patient's medications at the time of  discharge consisted of:  1. Pepcid 20 mg q.12 hours.  2. Senokot-S two tablets b.i.d.  3. Percocet 10/325 mg p.o. q.6 hours p.r.n.  4. Toprol XL 50 mg p.o. daily.  5. Prednisone.  The patient was given a tapering dose of prednisone.      Michaelyn Barter, M.D.  Electronically Signed     OR/MEDQ  D:  06/09/2007  T:  06/09/2007  Job:  [id number]

## 2010-07-25 NOTE — Assessment & Plan Note (Signed)
Todd [name] returns to clinic today for followup evaluation.  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  He does need a refill on his  hydrocodone and Ultram in the office in today.   MEDICATIONS:  1. Hydrocodone 10/325 one tablet q.i.d. p.r.n.  2. Toprol XL 25 mg daily.  3. Ultram 50 mg one to two tablets p.o. t.i.d. p.r.n.  4. Prevacid p.r.n.   REVIEW OF SYSTEMS:  Noncontributory.   PHYSICAL EXAMINATION:  Well-appearing thin adult male in mild to no  acute discomfort.  Blood pressure 131/77 with a pulse of 67, respiratory  rate 16, and O2 saturation 97% on room air.  He has 5/5 strength  throughout.  He ambulates without any assistive device.   IMPRESSION:  1. Persistent right thigh pain, status post mid femur fracture with      subsequent bone grafting.  2. History of recent right total hip replacement.  3. Status post removal of metal shavings from the right knee in August      2006.   In the office today we did refill the patient's Ultram and hydrocodone.  Will plan on seeing the patient in followup in approximately 5 months'  time with refills prior to that appointment as necessary.           ______________________________  Ellwood Dense, M.D.     DC/MedQ  D:  06/02/2006 11:14:52  T:  06/02/2006 11:41:21  Job #:  [id number]

## 2010-07-25 NOTE — Assessment & Plan Note (Signed)
DATE OF VISIT:  June 24, 2005.   SUBJECTIVE:  [name] returns to clinic today for follow up evaluation.  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  He continues to get good relief with hydrocodone and Ultram.  He tends to  use the Ultram when he is not working but tends to use the hydrocodone if he  needs increased pain relief.  He does need a refill on both of those in the  office today.   MEDICATIONS:  1.  Hydrocodone 10/325 one tablet q.i.d. p.r.n. (approximately 4 tablets per      day).  2.  Toprol XL 25 mg.  3.  Ultram 50 mg one to two tablets p.o. t.i.d. p.r.n.   REVIEW OF SYSTEMS:  Noncontributory.   PHYSICAL EXAMINATION:  GENERAL APPEARANCE:  Well-appearing, well-nourished,  adult male in no acute discomfort.  VITAL SIGNS:  Blood pressure 161/78 with pulse of 88, respiratory rate 16,  oxygen saturation 97% on room air.  MUSCULOSKELETAL:  He has 5/5 strength throughout the bilateral upper and  lower extremities.   IMPRESSION:  1.  Persistent right thigh pain, status post mid femur fracture and      subsequent bone grafting.  2.  History of recent right total hip replacement without difficulty.  3.  Status post removal of metal shavings from the right knee, August 2006.   PLAN:  In the office today we did refill the patient's Ultram and  hydrocodone.  He will continue to use those only as absolutely necessary.  He continues to be compliant with all prescription medicine use.  Will plan  to see the patient in follow up in approximately three months time.           ______________________________  Ellwood Dense, M.D.     DC/MedQ  D:  06/24/2005 14:17:33  T:  06/25/2005 10:06:47  Job #:  [id number]
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
## 2010-09-21 ENCOUNTER — Emergency Department (HOSPITAL_COMMUNITY)
Admission: EM | Admit: 2010-09-21 | Discharge: 2010-09-21 | Disposition: A | Discharge status: Home / Self Care | Payer: Medicare HMO | Attending: Emergency Medicine | Admitting: Emergency Medicine

## 2010-09-21 DIAGNOSIS — G8929 Other chronic pain: Secondary | ICD-10-CM | POA: Insufficient documentation

## 2010-09-21 DIAGNOSIS — M549 Dorsalgia, unspecified: Secondary | ICD-10-CM | POA: Insufficient documentation

## 2010-09-21 DIAGNOSIS — I10 Essential (primary) hypertension: Secondary | ICD-10-CM | POA: Insufficient documentation

## 2010-11-28 LAB — CBC
HCT: 35.2 — ABNORMAL LOW
HCT: 35.6 — ABNORMAL LOW
HCT: 37.1 — ABNORMAL LOW
HCT: 38.4 — ABNORMAL LOW
Hemoglobin: 12.2 — ABNORMAL LOW
Hemoglobin: 12.2 — ABNORMAL LOW
Hemoglobin: 12.7 — ABNORMAL LOW
Hemoglobin: 13.2
MCHC: 34.3
MCHC: 34.3
MCHC: 34.4
MCHC: 34.8
MCV: 88.6
MCV: 89.5
MCV: 89.8
MCV: 90.7
Platelets: 300
Platelets: 306
Platelets: 310
Platelets: 335
RBC: 3.96 — ABNORMAL LOW
RBC: 3.97 — ABNORMAL LOW
RBC: 4.09 — ABNORMAL LOW
RBC: 4.28
RDW: 12.8
RDW: 13
RDW: 13
RDW: 13
WBC: 10.7 — ABNORMAL HIGH
WBC: 11.5 — ABNORMAL HIGH
WBC: 5.5
WBC: 8.1

## 2010-11-28 LAB — COMPREHENSIVE METABOLIC PANEL
ALT: 15
AST: 11
Albumin: 3.7
Alkaline Phosphatase: 60
BUN: 12
CO2: 28
Calcium: 9
Chloride: 107
Creatinine, Ser: 0.8
GFR calc Af Amer: >60
GFR calc non Af Amer: >60
Glucose, Bld: 97
Potassium: 4.7
Sodium: 141
Total Bilirubin: 0.7
Total Protein: 6.8

## 2010-11-28 LAB — BASIC METABOLIC PANEL
BUN: 7
BUN: 9
CO2: 26
CO2: 28
Calcium: 8.2 — ABNORMAL LOW
Calcium: 8.8
Chloride: 102
Chloride: 98
Creatinine, Ser: 0.69
Creatinine, Ser: 0.75
GFR calc Af Amer: >60
GFR calc Af Amer: >60
GFR calc non Af Amer: >60
GFR calc non Af Amer: >60
Glucose, Bld: 145 — ABNORMAL HIGH
Glucose, Bld: 176 — ABNORMAL HIGH
Potassium: 3.6
Potassium: 4.6
Sodium: 129 — ABNORMAL LOW
Sodium: 135

## 2010-11-28 LAB — DIFFERENTIAL
Basophils Absolute: 0
Basophils Relative: 0
Eosinophils Absolute: 0
Eosinophils Relative: 1
Lymphocytes Relative: 15
Lymphs Abs: 0.8
Monocytes Absolute: 0.8
Monocytes Relative: 14 — ABNORMAL HIGH
Neutro Abs: 3.8
Neutrophils Relative %: 70

## 2010-11-28 LAB — PROTIME-INR
INR: 1.1
Prothrombin Time: 14.2

## 2010-11-28 LAB — MYCOPLASMA PNEUMONIAE ANTIBODY, IGM
Mycoplasma pneumo IgM: 113 Units/mL (ref <770)
Mycoplasma pneumo IgM: 184 Units/mL (ref <770)

## 2010-11-28 LAB — CULTURE, BLOOD (ROUTINE X 2)
Culture: NO GROWTH
Culture: NO GROWTH

## 2010-11-28 LAB — RPR: RPR Ser Ql: NONREACTIVE

## 2010-11-28 LAB — HSV(HERPES SMPLX)ABS-I+II(IGG+IGM)-BLD
Herpes Simplex Vrs I + II Ab, IgG: 0.2 IV
Herpes Simplex Vrs I&II-IgM Ab (EIA): NOT DETECTED

## 2010-11-28 LAB — APTT: aPTT: 26

## 2010-11-28 LAB — HIV ANTIBODY (ROUTINE TESTING W REFLEX): HIV: NONREACTIVE

## 2010-12-16 LAB — DIFFERENTIAL
Basophils Absolute: 0
Basophils Relative: 0
Eosinophils Absolute: 0.3
Eosinophils Relative: 7 — ABNORMAL HIGH
Lymphocytes Relative: 22
Lymphs Abs: 0.9
Monocytes Absolute: 0.7
Monocytes Relative: 16 — ABNORMAL HIGH
Neutro Abs: 2.4
Neutrophils Relative %: 56

## 2010-12-16 LAB — CBC
HCT: 32.6 — ABNORMAL LOW
Hemoglobin: 11.3 — ABNORMAL LOW
MCHC: 34.7
MCV: 88.9
Platelets: 236
RBC: 3.66 — ABNORMAL LOW
RDW: 13.5
WBC: 4.3

## 2010-12-16 LAB — BASIC METABOLIC PANEL
BUN: 18
CO2: 28
Calcium: 8.6
Chloride: 106
Creatinine, Ser: 0.53
GFR calc Af Amer: >60
GFR calc non Af Amer: >60
Glucose, Bld: 113 — ABNORMAL HIGH
Potassium: 4.3
Sodium: 137

## 2010-12-17 ENCOUNTER — Observation Stay (HOSPITAL_COMMUNITY)
Admission: EM | Admit: 2010-12-17 | Discharge: 2010-12-18 | Disposition: A | Discharge status: Home / Self Care | Payer: Medicare Other | Attending: Specialist | Admitting: Specialist

## 2010-12-17 ENCOUNTER — Emergency Department (HOSPITAL_COMMUNITY): Payer: Medicare Other

## 2010-12-17 DIAGNOSIS — Z96649 Presence of unspecified artificial hip joint: Secondary | ICD-10-CM | POA: Insufficient documentation

## 2010-12-17 DIAGNOSIS — X500XXA Overexertion from strenuous movement or load, initial encounter: Secondary | ICD-10-CM | POA: Insufficient documentation

## 2010-12-17 DIAGNOSIS — T84029A Dislocation of unspecified internal joint prosthesis, initial encounter: Principal | ICD-10-CM | POA: Insufficient documentation

## 2010-12-17 DIAGNOSIS — Y92009 Unspecified place in unspecified non-institutional (private) residence as the place of occurrence of the external cause: Secondary | ICD-10-CM | POA: Insufficient documentation

## 2010-12-17 LAB — DIFFERENTIAL
Band Neutrophils: 0 % (ref 0–10)
Basophils Absolute: 0 10*3/uL (ref 0.0–0.1)
Basophils Relative: 0 % (ref 0–1)
Blasts: 0 %
Eosinophils Absolute: 0.1 10*3/uL (ref 0.0–0.7)
Eosinophils Relative: 1 % (ref 0–5)
Lymphocytes Relative: 12 % (ref 12–46)
Lymphs Abs: 1.4 10*3/uL (ref 0.7–4.0)
Metamyelocytes Relative: 0 %
Monocytes Absolute: 0.7 10*3/uL (ref 0.1–1.0)
Monocytes Relative: 6 % (ref 3–12)
Myelocytes: 0 %
Neutro Abs: 9.6 10*3/uL — ABNORMAL HIGH (ref 1.7–7.7)
Neutrophils Relative %: 81 % — ABNORMAL HIGH (ref 43–77)
Promyelocytes Absolute: 0 %
nRBC: 0 /100 WBC

## 2010-12-17 LAB — COMPREHENSIVE METABOLIC PANEL
ALT: 30 U/L (ref 0–53)
AST: 27 U/L (ref 0–37)
Albumin: 4.1 g/dL (ref 3.5–5.2)
Alkaline Phosphatase: 83 U/L (ref 39–117)
BUN: 19 mg/dL (ref 6–23)
CO2: 30 mEq/L (ref 19–32)
Calcium: 9.5 mg/dL (ref 8.4–10.5)
Chloride: 103 mEq/L (ref 96–112)
Creatinine, Ser: 0.88 mg/dL (ref 0.50–1.35)
GFR calc Af Amer: >90 mL/min (ref >90)
GFR calc non Af Amer: >90 mL/min (ref >90)
Glucose, Bld: 107 mg/dL — ABNORMAL HIGH (ref 70–99)
Potassium: 4.1 mEq/L (ref 3.5–5.1)
Sodium: 139 mEq/L (ref 135–145)
Total Bilirubin: 0.3 mg/dL (ref 0.3–1.2)
Total Protein: 7.5 g/dL (ref 6.0–8.3)

## 2010-12-17 LAB — CBC
HCT: 40.4 % (ref 39.0–52.0)
Hemoglobin: 13.6 g/dL (ref 13.0–17.0)
MCH: 28.7 pg (ref 26.0–34.0)
MCHC: 33.7 g/dL (ref 30.0–36.0)
MCV: 85.2 fL (ref 78.0–100.0)
Platelets: 295 10*3/uL (ref 150–400)
RBC: 4.74 MIL/uL (ref 4.22–5.81)
RDW: 13.9 % (ref 11.5–15.5)
WBC: 11.8 10*3/uL — ABNORMAL HIGH (ref 4.0–10.5)

## 2010-12-19 NOTE — Op Note (Signed)
NAME:  Todd [name], LOCKRIDGE NO.:  [id number]  MEDICAL RECORD NO.:  [id number]  LOCATION:  5007                         FACILITY:  [facility]  PHYSICIAN:  Kerrin Champagne, M.D.   DATE OF BIRTH:  1971/02/16  DATE OF PROCEDURE:  12/17/2010 DATE OF DISCHARGE:                              OPERATIVE REPORT   PREOPERATIVE DIAGNOSIS:  Right total hip replacement, posterior dislocation.  POSTOPERATIVE DIAGNOSIS:  Right total hip replacement, posterior dislocation.  PROCEDURE:  Right dislocated total hip replacement, close reduction under general anesthetic.  SURGEON:  Kerrin Champagne, MD  ASSISTANT:  None.  ANESTHESIA:  General via oral esophageal obturator, Dr. Bedelia Person.  FINDINGS:  Right posterior superior hip dislocation.  SPECIMENS:  None.  ESTIMATED BLOOD LOSS:  0 mL.  COMPLICATIONS:  None.  The patient returned to the PACU in good condition.  HISTORY OF PRESENT ILLNESS:  A 40 year old male who has undergone previous relocation of the dislocated right total hip replacement x3 by Dr. August Saucer and Dr. Ophelia Charter.  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  He had injuries in 2004, underwent hip surgery by Dr. Turner Daniels in 2005.  Eventually he was the different doctor to take care of him, Dr. August Saucer assumed his care.  He has had a relocation of his right total hip arthroplasty 3 times now and returns now for 4th dislocations right total hip.  Initial dislocation occurred while he was in the [facility] and was treated at [facility]  The patient was seen this evening after having been sitting in a chair and flexing his right hip in order to place the socks on his right leg.  The patient understands the risks and benefits of the procedure.  He has signed informed consent.  Discussion regarding possible need  for open relocation if close reduction is not successful.  He has signed both the consent as well as the preoperative blood transfusion if necessary permit.  DESCRIPTION OF PROCEDURE:  The patient was seen in the preoperative holding area, had marking of the right hip with a right anterior lateral thigh with an X in my initials.  Received standard preoperative antibiotics of Ancef 1 g.  He was then transported to the OR after discussion and all the questions were answered.  The patient was transferred to the OR room #2 at [facility] was used for this procedure. Transported via stretcher and he then underwent induction of general anesthesia while on his stretcher and standard time-out protocol was carried out identifying the patient, site of the procedure, any blood loss expected, length and duration of the procedure.  With the induction of anesthesia and the placement of the obturator then after okay with anesthesia, then standing on a foot stool over the right side of this patient's stretcher with a stretcher lock then the right hip was brought into 90/90 position with abduction.  Counter traction was placed over this patient's right anterior-superior iliac spine, holding the pelvis to the stretcher and then traction was placed on  the femur with the knee flexed and the hip was able to be relocated with audible palpable clunk. The hip was then brought into extension.  Foot noted to be in correct degree of external rotation.  The leg length is slightly longer on the right side compared with the left perhaps a half inch to three quarters of an inch.  The right leg was then placed into a knee immobilizer with the posterior stays removed, well padded.  A plain AP radiograph of the pelvis was then obtained documenting a relocation of the right total hip arthroplasty in good position alignment.  The patient then was reactivated and extubated, returned to the recovery room in  satisfactory condition.  All instrument and sponge counts were correct. Postoperative care of this patient will be admitted overnight observation, discharge in the morning under 23-hour observation.  At the end of procedure, all instrument and sponge counts were correct.  This was a closed procedure.  Complications none.  The patient was returned to PACU in satisfactory condition.     Kerrin Champagne, M.D.     JEN/MEDQ  D:  12/17/2010  T:  12/18/2010  Job:  [id number]  Electronically Signed by Vira Browns M.D. on 12/19/2010 05:44:15 PM

## 2010-12-25 LAB — DIFFERENTIAL
Basophils Absolute: 0
Basophils Relative: 0
Eosinophils Absolute: 0.1
Eosinophils Relative: 2
Lymphocytes Relative: 12
Lymphs Abs: 0.7
Monocytes Absolute: 0.6
Monocytes Relative: 10
Neutro Abs: 4.4
Neutrophils Relative %: 75

## 2010-12-25 LAB — CBC
HCT: 36.3 — ABNORMAL LOW
Hemoglobin: 12.4 — ABNORMAL LOW
MCHC: 34.1
MCV: 89.7
Platelets: 287
RBC: 4.05 — ABNORMAL LOW
RDW: 13.1
WBC: 5.9

## 2010-12-25 LAB — BASIC METABOLIC PANEL
BUN: 14
CO2: 27
Calcium: 8.9
Chloride: 110
Creatinine, Ser: 0.75
GFR calc Af Amer: >60
GFR calc non Af Amer: >60
Glucose, Bld: 105 — ABNORMAL HIGH
Potassium: 4.1
Sodium: 138

## 2011-05-19 DIAGNOSIS — G4700 Insomnia, unspecified: Secondary | ICD-10-CM | POA: Diagnosis not present

## 2011-05-19 DIAGNOSIS — E785 Hyperlipidemia, unspecified: Secondary | ICD-10-CM | POA: Diagnosis not present

## 2011-05-19 DIAGNOSIS — I10 Essential (primary) hypertension: Secondary | ICD-10-CM | POA: Diagnosis not present

## 2011-05-19 DIAGNOSIS — M25559 Pain in unspecified hip: Secondary | ICD-10-CM | POA: Diagnosis not present

## 2011-08-11 DIAGNOSIS — G4700 Insomnia, unspecified: Secondary | ICD-10-CM | POA: Diagnosis not present

## 2011-08-11 DIAGNOSIS — M25559 Pain in unspecified hip: Secondary | ICD-10-CM | POA: Diagnosis not present

## 2011-08-11 DIAGNOSIS — I10 Essential (primary) hypertension: Secondary | ICD-10-CM | POA: Diagnosis not present

## 2011-08-11 DIAGNOSIS — E785 Hyperlipidemia, unspecified: Secondary | ICD-10-CM | POA: Diagnosis not present

## 2012-01-22 DIAGNOSIS — F341 Dysthymic disorder: Secondary | ICD-10-CM | POA: Diagnosis not present

## 2012-01-22 DIAGNOSIS — I10 Essential (primary) hypertension: Secondary | ICD-10-CM | POA: Diagnosis not present

## 2012-01-22 DIAGNOSIS — Z125 Encounter for screening for malignant neoplasm of prostate: Secondary | ICD-10-CM | POA: Diagnosis not present

## 2012-01-22 DIAGNOSIS — K219 Gastro-esophageal reflux disease without esophagitis: Secondary | ICD-10-CM | POA: Diagnosis not present

## 2012-01-22 DIAGNOSIS — D649 Anemia, unspecified: Secondary | ICD-10-CM | POA: Diagnosis not present

## 2012-01-22 DIAGNOSIS — E785 Hyperlipidemia, unspecified: Secondary | ICD-10-CM | POA: Diagnosis not present

## 2012-01-22 DIAGNOSIS — R5381 Other malaise: Secondary | ICD-10-CM | POA: Diagnosis not present

## 2012-01-22 DIAGNOSIS — M25559 Pain in unspecified hip: Secondary | ICD-10-CM | POA: Diagnosis not present

## 2012-01-22 DIAGNOSIS — R946 Abnormal results of thyroid function studies: Secondary | ICD-10-CM | POA: Diagnosis not present

## 2012-01-22 DIAGNOSIS — R5383 Other fatigue: Secondary | ICD-10-CM | POA: Diagnosis not present

## 2012-02-03 DIAGNOSIS — K921 Melena: Secondary | ICD-10-CM | POA: Diagnosis not present

## 2012-02-03 DIAGNOSIS — K5900 Constipation, unspecified: Secondary | ICD-10-CM | POA: Diagnosis not present

## 2012-02-03 DIAGNOSIS — D509 Iron deficiency anemia, unspecified: Secondary | ICD-10-CM | POA: Diagnosis not present

## 2012-02-15 ENCOUNTER — Encounter (HOSPITAL_COMMUNITY): Payer: Self-pay | Admitting: Pharmacy Technician

## 2012-02-15 ENCOUNTER — Encounter (HOSPITAL_COMMUNITY): Payer: Self-pay | Admitting: *Deleted

## 2012-02-15 NOTE — Pre-Procedure Instructions (Signed)
Your procedure is scheduled on:Wednesday, February 17, 2012 Report to [facility] Admitting WU:[id number] Call this number if you have problems morning of your procedure:[phone]  Follow all bowel prep instructions per your doctor's orders.  Do not eat or drink anything after midnight the night before your procedure. You may brush your teeth, rinse out your mouth, but no water, no food, no chewing gum, no mints, no candies, no chewing tobacco.     Take these medicines the morning of your procedure with A SIP OF WATER:Catapress, Metoprolol and Priolsec   Please make arrangements for a responsible person to drive you home after the procedure. You cannot go home by cab/taxi. We recommend you have someone with you at home the first 24 hours after your procedure. Driver for procedure is Marine scientist  LEAVE ALL VALUABLES, JEWELRY, BILLFOLD AT HOME.  NO DENTURES, CONTACT LENSES ALLOWED IN THE ENDOSCOPY ROOM.   YOU MAY WEAR DEODORANT, PLEASE REMOVE ALL JEWELRY, WATCHES RINGS, BODY PIERCINGS AND LEAVE AT HOME.   WOMEN: NO MAKE-UP, LOTIONS PERFUMES

## 2012-02-17 ENCOUNTER — Encounter (HOSPITAL_COMMUNITY): Payer: Self-pay | Admitting: Anesthesiology

## 2012-02-17 ENCOUNTER — Encounter (HOSPITAL_COMMUNITY)
Admission: RE | Disposition: A | Discharge status: Home / Self Care | Payer: Self-pay | Source: Ambulatory Visit | Attending: Gastroenterology

## 2012-02-17 ENCOUNTER — Ambulatory Visit (HOSPITAL_COMMUNITY): Payer: Medicare Other | Admitting: Anesthesiology

## 2012-02-17 ENCOUNTER — Encounter (HOSPITAL_COMMUNITY): Payer: Self-pay | Admitting: Gastroenterology

## 2012-02-17 ENCOUNTER — Ambulatory Visit (HOSPITAL_COMMUNITY)
Admission: RE | Admit: 2012-02-17 | Discharge: 2012-02-17 | Disposition: A | Discharge status: Home / Self Care | Payer: Medicare Other | Source: Ambulatory Visit | Attending: Gastroenterology | Admitting: Gastroenterology

## 2012-02-17 DIAGNOSIS — K449 Diaphragmatic hernia without obstruction or gangrene: Secondary | ICD-10-CM | POA: Diagnosis not present

## 2012-02-17 DIAGNOSIS — K921 Melena: Secondary | ICD-10-CM | POA: Diagnosis not present

## 2012-02-17 DIAGNOSIS — I10 Essential (primary) hypertension: Secondary | ICD-10-CM | POA: Diagnosis not present

## 2012-02-17 DIAGNOSIS — K440 Diaphragmatic hernia with obstruction, without gangrene: Secondary | ICD-10-CM | POA: Diagnosis not present

## 2012-02-17 DIAGNOSIS — K219 Gastro-esophageal reflux disease without esophagitis: Secondary | ICD-10-CM | POA: Diagnosis not present

## 2012-02-17 DIAGNOSIS — D509 Iron deficiency anemia, unspecified: Secondary | ICD-10-CM | POA: Diagnosis not present

## 2012-02-17 DIAGNOSIS — K5900 Constipation, unspecified: Secondary | ICD-10-CM | POA: Diagnosis not present

## 2012-02-17 DIAGNOSIS — K648 Other hemorrhoids: Secondary | ICD-10-CM | POA: Insufficient documentation

## 2012-02-17 DIAGNOSIS — D649 Anemia, unspecified: Secondary | ICD-10-CM | POA: Diagnosis not present

## 2012-02-17 HISTORY — PX: COLONOSCOPY WITH PROPOFOL: SHX5780

## 2012-02-17 HISTORY — DX: Gastro-esophageal reflux disease without esophagitis: K21.9

## 2012-02-17 HISTORY — DX: Essential (primary) hypertension: I10

## 2012-02-17 HISTORY — DX: Personal history of other diseases of the digestive system: Z87.19

## 2012-02-17 HISTORY — PX: ESOPHAGOGASTRODUODENOSCOPY (EGD) WITH PROPOFOL: SHX5813

## 2012-02-17 SURGERY — ESOPHAGOGASTRODUODENOSCOPY (EGD) WITH PROPOFOL
Anesthesia: Monitor Anesthesia Care

## 2012-02-17 MED ORDER — SODIUM CHLORIDE 0.9 % IV SOLN: INTRAVENOUS | Status: DC

## 2012-02-17 MED ORDER — BUTAMBEN-TETRACAINE-BENZOCAINE 2-2-14 % EX AERO
INHALATION_SPRAY | CUTANEOUS | Status: DC | PRN
  Administered 2012-02-17: 2 via TOPICAL

## 2012-02-17 MED ORDER — LACTATED RINGERS IV SOLN
INTRAVENOUS | Status: DC
  Administered 2012-02-17: 1000 mL via INTRAVENOUS

## 2012-02-17 MED ORDER — MIDAZOLAM HCL 5 MG/5ML IJ SOLN
INTRAMUSCULAR | Status: DC | PRN
  Administered 2012-02-17: 2 mg via INTRAVENOUS

## 2012-02-17 MED ORDER — PROPOFOL 10 MG/ML IV EMUL
INTRAVENOUS | Status: DC | PRN
  Administered 2012-02-17: 100 ug/kg/min via INTRAVENOUS

## 2012-02-17 MED ORDER — FENTANYL CITRATE 0.05 MG/ML IJ SOLN
INTRAMUSCULAR | Status: DC | PRN
  Administered 2012-02-17: 100 ug via INTRAVENOUS

## 2012-02-17 MED ORDER — KETAMINE HCL 10 MG/ML IJ SOLN
INTRAMUSCULAR | Status: DC | PRN
  Administered 2012-02-17: 10 mg via INTRAVENOUS
  Administered 2012-02-17: 20 mg via INTRAVENOUS

## 2012-02-17 SURGICAL SUPPLY — 24 items

## 2012-02-17 NOTE — Transfer of Care (Signed)
Immediate Anesthesia Transfer of Care Note  Patient: Todd [name]  Procedure(s) Performed: Procedure(s) (LRB) with comments: ESOPHAGOGASTRODUODENOSCOPY (EGD) WITH PROPOFOL (N/A) COLONOSCOPY WITH PROPOFOL (N/A)  Patient Location: PACU  Anesthesia Type:MAC  Level of Consciousness: awake, alert  and oriented  Airway & Oxygen Therapy: Patient Spontanous Breathing and Patient connected to nasal cannula oxygen  Post-op Assessment: Report given to PACU RN and Post -op Vital signs reviewed and stable  Post vital signs: Reviewed and stable  Complications: No apparent anesthesia complications

## 2012-02-17 NOTE — Op Note (Signed)
[facility] [address] [place], [address]   COLONOSCOPY PROCEDURE REPORT  PATIENT: Todd [name], Todd [name]  MR#: [id number] BIRTHDATE: 10/03/1970 , 41  yrs. old GENDER: Male ENDOSCOPIST: Willis Modena, MD REFERRED UU:[id number] Cliffton Asters, M.D. PROCEDURE DATE:  02/17/2012 PROCEDURE:   Colonoscopy, diagnostic ASA CLASS:   Class II INDICATIONS:iron-deficiency anemia, constipation, blood in stool. MEDICATIONS: MAC sedation, administered by CRNA  DESCRIPTION OF PROCEDURE:   After the risks benefits and alternatives of the procedure were thoroughly explained, informed consent was obtained.  A digital rectal exam revealed no abnormalities of the rectum.   The Pentax Ped Colon [id number] endoscope was introduced through the anus and advanced to the cecum, which was identified by both the appendix and ileocecal valve. No adverse events experienced.   The quality of the prep was good.  The instrument was then slowly withdrawn as the colon was fully examined.   Findings:  Digital rectal exam was normal.  Prep quality fair in ascending colon (diminutive or subtle lesions could have been missed ), but good elsewhere.  No polyps, masses, vascular ectasias, or inflammatory changes were seen.  Retroflexed views of rectum showed internal hemorrhoids, otherwise normal. Withdrawal time was about 10 minutes     .  The scope was withdrawn and the procedure completed.  ENDOSCOPIC IMPRESSION:     As above.  Suspect internal hemorrhoids are source of intermittent hematochezia.  No source of iron-deficiency anemia was identified.  RECOMMENDATIONS:     1.  Watch for potential complications of procedure. 2.  High fiber diet. 3.  Topical therapies (e.g., Preparation-H) as needed for hemorrhoidal bleeding. 4.  Restart oral iron therapy. 5.  If anemia persists despite oral iron repletion, consider CT enterography abd/pelvis versus capsule endoscopy. 6.  Follow-up with [facility] in 2-3  months.  eSigned:  Willis Modena, MD 02/17/2012 2:44 PM   cc:

## 2012-02-17 NOTE — H&P (Signed)
Patient interval history reviewed.  Patient examined again.  There has been no change from documented H/P dated 02/03/12 (scanned into chart from our office) except as documented above.  Assessment:  1.  Iron-deficiency anemia. 2.  Constipation. 3.  Blood in stool.  Plan:  1.  Upper endoscopy (EGD). 2.  Risks (bleeding, infection, bowel perforation that could require surgery, sedation-related changes in cardiopulmonary systems), benefits (identification and possible treatment of source of symptoms, exclusion of certain causes of symptoms), and alternatives (watchful waiting, radiographic imaging studies, empiric medical treatment) of upper endoscopy (EGD) were explained to patient/girlfriend in detail and he wishes to proceed. 3.  Colonoscopy. 4.  Risks (bleeding, infection, bowel perforation that could require surgery, sedation-related changes in cardiopulmonary systems), benefits (identification and possible treatment of source of symptoms, exclusion of certain causes of symptoms), and alternatives (watchful waiting, radiographic imaging studies, empiric medical treatment) of colonoscopy were explained to patient/girlfriend in detail and he wishes to proceed.

## 2012-02-17 NOTE — Anesthesia Preprocedure Evaluation (Signed)
Anesthesia Evaluation  Patient identified by MRN, date of birth, ID band Patient awake    Reviewed: Allergy & Precautions, H&P , NPO status , Patient's Chart, lab work & pertinent test results  Airway Mallampati: II TM Distance: >3 FB Neck ROM: Full    Dental No notable dental hx.    Pulmonary neg pulmonary ROS,  breath sounds clear to auscultation  Pulmonary exam normal       Cardiovascular hypertension, Pt. on medications negative cardio ROS  Rhythm:Regular Rate:Normal     Neuro/Psych negative neurological ROS  negative psych ROS   GI/Hepatic negative GI ROS, Neg liver ROS, hiatal hernia, GERD-  Medicated and Controlled,  Endo/Other  negative endocrine ROS  Renal/GU negative Renal ROS  negative genitourinary   Musculoskeletal negative musculoskeletal ROS (+)   Abdominal   Peds negative pediatric ROS (+)  Hematology negative hematology ROS (+)   Anesthesia Other Findings   Reproductive/Obstetrics negative OB ROS                           Anesthesia Physical Anesthesia Plan  ASA: II  Anesthesia Plan: MAC   Post-op Pain Management:    Induction:   Airway Management Planned: Simple Face Mask and Nasal Cannula  Additional Equipment:   Intra-op Plan:   Post-operative Plan:   Informed Consent: I have reviewed the patients History and Physical, chart, labs and discussed the procedure including the risks, benefits and alternatives for the proposed anesthesia with the patient or authorized representative who has indicated his/her understanding and acceptance.   Dental advisory given  Plan Discussed with: CRNA  Anesthesia Plan Comments:         Anesthesia Quick Evaluation

## 2012-02-17 NOTE — Op Note (Signed)
[facility] [address] [place], [address]   ENDOSCOPY PROCEDURE REPORT  PATIENT: Todd [name], Todd [name]  MR#: [id number] BIRTHDATE: 1970/03/20 , 41  yrs. old GENDER: Male ENDOSCOPIST: Willis Modena, MD REFERRED BY:  Laurann Montana, M.D. PROCEDURE DATE:  02/17/2012 PROCEDURE:  EGD w/ biopsy ASA CLASS:     Class II INDICATIONS:  iron-deficiency anemia, blood in stool. MEDICATIONS: MAC sedation, administered by CRNA and Cetacaine spray x 2 TOPICAL ANESTHETIC: Cetacaine Spray  DESCRIPTION OF PROCEDURE: After the risks benefits and alternatives of the procedure were thoroughly explained, informed consent was obtained.  The Pentax Gastroscope [id number] endoscope was introduced through the mouth and advanced to the second portion of the duodenum. Without limitations.  The instrument was slowly withdrawn as the mucosa was fully examined.    Findings:  Paraesophageal-appearing hernia noted.  Otherwise normal stomach, pylorus, and duodenum to the second portion.  Random biopsies of normal-appearing duodenum were taken with cold forceps to evaluate for celiac disease.           The scope was then withdrawn from the patient and the procedure completed.  ENDOSCOPIC IMPRESSION:     As above.  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  RECOMMENDATIONS:     1.  Watch for potential complications of procedure. 2.  Await biopsy results. 3.  Proceed with colonoscopy today.  eSigned:  Willis Modena, MD 02/17/2012 2:38 PM   CC:

## 2012-02-17 NOTE — OR Nursing (Signed)
Pt positioned for endo procedure. In LLD position, pillow placed in between legs as pt is s/p RT hip replacement.  Angelique Blonder, RN

## 2012-02-17 NOTE — Anesthesia Postprocedure Evaluation (Signed)
  Anesthesia Post-op Note  Patient: Todd [name]  Procedure(s) Performed: Procedure(s) (LRB): ESOPHAGOGASTRODUODENOSCOPY (EGD) WITH PROPOFOL (N/A) COLONOSCOPY WITH PROPOFOL (N/A)  Patient Location: PACU  Anesthesia Type: MAC  Level of Consciousness: awake and alert   Airway and Oxygen Therapy: Patient Spontanous Breathing  Post-op Pain: mild  Post-op Assessment: Post-op Vital signs reviewed, Patient's Cardiovascular Status Stable, Respiratory Function Stable, Patent Airway and No signs of Nausea or vomiting  Last Vitals:  Filed Vitals:   02/17/12 1504  BP: 124/82  Pulse:   Temp:   Resp: 13    Post-op Vital Signs: stable   Complications: No apparent anesthesia complications

## 2012-02-18 ENCOUNTER — Encounter (HOSPITAL_COMMUNITY): Payer: Self-pay | Admitting: Gastroenterology

## 2012-02-22 DIAGNOSIS — Z23 Encounter for immunization: Secondary | ICD-10-CM | POA: Diagnosis not present

## 2012-02-22 DIAGNOSIS — M25559 Pain in unspecified hip: Secondary | ICD-10-CM | POA: Diagnosis not present

## 2012-02-22 DIAGNOSIS — D509 Iron deficiency anemia, unspecified: Secondary | ICD-10-CM | POA: Diagnosis not present

## 2012-02-22 DIAGNOSIS — F411 Generalized anxiety disorder: Secondary | ICD-10-CM | POA: Diagnosis not present

## 2012-04-13 DIAGNOSIS — M25559 Pain in unspecified hip: Secondary | ICD-10-CM | POA: Diagnosis not present

## 2012-04-13 DIAGNOSIS — Z471 Aftercare following joint replacement surgery: Secondary | ICD-10-CM | POA: Diagnosis not present

## 2012-04-13 DIAGNOSIS — Z96649 Presence of unspecified artificial hip joint: Secondary | ICD-10-CM | POA: Diagnosis not present

## 2012-04-13 DIAGNOSIS — M948X9 Other specified disorders of cartilage, unspecified sites: Secondary | ICD-10-CM | POA: Diagnosis not present

## 2012-04-13 DIAGNOSIS — M1610 Unilateral primary osteoarthritis, unspecified hip: Secondary | ICD-10-CM | POA: Diagnosis not present

## 2012-04-14 DIAGNOSIS — F341 Dysthymic disorder: Secondary | ICD-10-CM | POA: Diagnosis not present

## 2012-04-14 DIAGNOSIS — M25559 Pain in unspecified hip: Secondary | ICD-10-CM | POA: Diagnosis not present

## 2012-04-14 DIAGNOSIS — D509 Iron deficiency anemia, unspecified: Secondary | ICD-10-CM | POA: Diagnosis not present

## 2012-04-14 DIAGNOSIS — G479 Sleep disorder, unspecified: Secondary | ICD-10-CM | POA: Diagnosis not present

## 2012-04-19 DIAGNOSIS — T8489XA Other specified complication of internal orthopedic prosthetic devices, implants and grafts, initial encounter: Secondary | ICD-10-CM | POA: Diagnosis not present

## 2012-04-20 DIAGNOSIS — T8489XA Other specified complication of internal orthopedic prosthetic devices, implants and grafts, initial encounter: Secondary | ICD-10-CM | POA: Diagnosis not present

## 2012-05-12 DIAGNOSIS — F341 Dysthymic disorder: Secondary | ICD-10-CM | POA: Diagnosis not present

## 2012-05-12 DIAGNOSIS — M25559 Pain in unspecified hip: Secondary | ICD-10-CM | POA: Diagnosis not present

## 2012-08-05 DIAGNOSIS — Z79899 Other long term (current) drug therapy: Secondary | ICD-10-CM | POA: Diagnosis not present

## 2012-08-05 DIAGNOSIS — G894 Chronic pain syndrome: Secondary | ICD-10-CM | POA: Diagnosis not present

## 2012-08-05 DIAGNOSIS — D72829 Elevated white blood cell count, unspecified: Secondary | ICD-10-CM | POA: Diagnosis not present

## 2012-08-05 DIAGNOSIS — I10 Essential (primary) hypertension: Secondary | ICD-10-CM | POA: Diagnosis not present

## 2012-08-05 DIAGNOSIS — F411 Generalized anxiety disorder: Secondary | ICD-10-CM | POA: Diagnosis not present

## 2012-08-05 DIAGNOSIS — D509 Iron deficiency anemia, unspecified: Secondary | ICD-10-CM | POA: Diagnosis not present

## 2012-08-05 DIAGNOSIS — Z23 Encounter for immunization: Secondary | ICD-10-CM | POA: Diagnosis not present

## 2012-08-05 DIAGNOSIS — E785 Hyperlipidemia, unspecified: Secondary | ICD-10-CM | POA: Diagnosis not present

## 2012-10-26 DIAGNOSIS — F341 Dysthymic disorder: Secondary | ICD-10-CM | POA: Diagnosis not present

## 2012-10-26 DIAGNOSIS — I10 Essential (primary) hypertension: Secondary | ICD-10-CM | POA: Diagnosis not present

## 2012-10-26 DIAGNOSIS — D509 Iron deficiency anemia, unspecified: Secondary | ICD-10-CM | POA: Diagnosis not present

## 2012-10-26 DIAGNOSIS — M25559 Pain in unspecified hip: Secondary | ICD-10-CM | POA: Diagnosis not present

## 2013-01-25 DIAGNOSIS — M25559 Pain in unspecified hip: Secondary | ICD-10-CM | POA: Diagnosis not present

## 2013-01-25 DIAGNOSIS — I10 Essential (primary) hypertension: Secondary | ICD-10-CM | POA: Diagnosis not present

## 2013-01-25 DIAGNOSIS — E785 Hyperlipidemia, unspecified: Secondary | ICD-10-CM | POA: Diagnosis not present

## 2013-01-25 DIAGNOSIS — F341 Dysthymic disorder: Secondary | ICD-10-CM | POA: Diagnosis not present

## 2013-01-25 DIAGNOSIS — D509 Iron deficiency anemia, unspecified: Secondary | ICD-10-CM | POA: Diagnosis not present

## 2013-04-26 DIAGNOSIS — D509 Iron deficiency anemia, unspecified: Secondary | ICD-10-CM | POA: Diagnosis not present

## 2013-04-26 DIAGNOSIS — E785 Hyperlipidemia, unspecified: Secondary | ICD-10-CM | POA: Diagnosis not present

## 2013-04-26 DIAGNOSIS — I10 Essential (primary) hypertension: Secondary | ICD-10-CM | POA: Diagnosis not present

## 2013-04-26 DIAGNOSIS — F411 Generalized anxiety disorder: Secondary | ICD-10-CM | POA: Diagnosis not present

## 2013-04-26 DIAGNOSIS — M25559 Pain in unspecified hip: Secondary | ICD-10-CM | POA: Diagnosis not present

## 2013-04-26 DIAGNOSIS — N529 Male erectile dysfunction, unspecified: Secondary | ICD-10-CM | POA: Diagnosis not present

## 2013-04-26 DIAGNOSIS — R7989 Other specified abnormal findings of blood chemistry: Secondary | ICD-10-CM | POA: Diagnosis not present

## 2013-07-25 DIAGNOSIS — N529 Male erectile dysfunction, unspecified: Secondary | ICD-10-CM | POA: Diagnosis not present

## 2013-07-25 DIAGNOSIS — M25559 Pain in unspecified hip: Secondary | ICD-10-CM | POA: Diagnosis not present

## 2013-07-25 DIAGNOSIS — I10 Essential (primary) hypertension: Secondary | ICD-10-CM | POA: Diagnosis not present

## 2013-07-25 DIAGNOSIS — Z96649 Presence of unspecified artificial hip joint: Secondary | ICD-10-CM | POA: Diagnosis not present

## 2013-07-25 DIAGNOSIS — Z125 Encounter for screening for malignant neoplasm of prostate: Secondary | ICD-10-CM | POA: Diagnosis not present

## 2013-07-25 DIAGNOSIS — E785 Hyperlipidemia, unspecified: Secondary | ICD-10-CM | POA: Diagnosis not present

## 2013-07-25 DIAGNOSIS — F411 Generalized anxiety disorder: Secondary | ICD-10-CM | POA: Diagnosis not present

## 2013-07-25 DIAGNOSIS — D509 Iron deficiency anemia, unspecified: Secondary | ICD-10-CM | POA: Diagnosis not present

## 2013-09-14 DIAGNOSIS — M25559 Pain in unspecified hip: Secondary | ICD-10-CM | POA: Diagnosis not present

## 2013-09-14 DIAGNOSIS — Z96649 Presence of unspecified artificial hip joint: Secondary | ICD-10-CM | POA: Diagnosis not present

## 2013-09-14 DIAGNOSIS — M6250 Muscle wasting and atrophy, not elsewhere classified, unspecified site: Secondary | ICD-10-CM | POA: Diagnosis not present

## 2013-09-14 DIAGNOSIS — M87059 Idiopathic aseptic necrosis of unspecified femur: Secondary | ICD-10-CM | POA: Diagnosis not present

## 2013-09-19 ENCOUNTER — Encounter (HOSPITAL_COMMUNITY)
Admission: EM | Disposition: A | Discharge status: Home / Self Care | Payer: Self-pay | Source: Home / Self Care | Attending: Internal Medicine

## 2013-09-19 ENCOUNTER — Inpatient Hospital Stay (HOSPITAL_COMMUNITY)
Admission: EM | Admit: 2013-09-19 | Discharge: 2013-09-21 | DRG: 378 | Disposition: A | Discharge status: Home / Self Care | Payer: Medicare Other | Attending: Internal Medicine | Admitting: Internal Medicine

## 2013-09-19 ENCOUNTER — Emergency Department (HOSPITAL_COMMUNITY): Payer: Medicare Other | Admitting: Certified Registered Nurse Anesthetist

## 2013-09-19 ENCOUNTER — Encounter (HOSPITAL_COMMUNITY): Payer: Medicare Other | Admitting: Certified Registered Nurse Anesthetist

## 2013-09-19 ENCOUNTER — Encounter (HOSPITAL_COMMUNITY): Payer: Self-pay | Admitting: Emergency Medicine

## 2013-09-19 DIAGNOSIS — D62 Acute posthemorrhagic anemia: Secondary | ICD-10-CM | POA: Diagnosis not present

## 2013-09-19 DIAGNOSIS — I10 Essential (primary) hypertension: Secondary | ICD-10-CM | POA: Diagnosis not present

## 2013-09-19 DIAGNOSIS — K449 Diaphragmatic hernia without obstruction or gangrene: Secondary | ICD-10-CM | POA: Diagnosis present

## 2013-09-19 DIAGNOSIS — K219 Gastro-esophageal reflux disease without esophagitis: Secondary | ICD-10-CM | POA: Diagnosis present

## 2013-09-19 DIAGNOSIS — I498 Other specified cardiac arrhythmias: Secondary | ICD-10-CM

## 2013-09-19 DIAGNOSIS — R000 Tachycardia, unspecified: Secondary | ICD-10-CM | POA: Diagnosis present

## 2013-09-19 DIAGNOSIS — D509 Iron deficiency anemia, unspecified: Secondary | ICD-10-CM | POA: Diagnosis present

## 2013-09-19 DIAGNOSIS — K922 Gastrointestinal hemorrhage, unspecified: Secondary | ICD-10-CM | POA: Diagnosis not present

## 2013-09-19 DIAGNOSIS — R112 Nausea with vomiting, unspecified: Secondary | ICD-10-CM | POA: Diagnosis not present

## 2013-09-19 DIAGNOSIS — E860 Dehydration: Secondary | ICD-10-CM | POA: Diagnosis not present

## 2013-09-19 DIAGNOSIS — Z96649 Presence of unspecified artificial hip joint: Secondary | ICD-10-CM | POA: Diagnosis not present

## 2013-09-19 DIAGNOSIS — K2961 Other gastritis with bleeding: Secondary | ICD-10-CM | POA: Diagnosis not present

## 2013-09-19 DIAGNOSIS — K2960 Other gastritis without bleeding: Secondary | ICD-10-CM | POA: Diagnosis not present

## 2013-09-19 DIAGNOSIS — K2970 Gastritis, unspecified, without bleeding: Secondary | ICD-10-CM | POA: Diagnosis not present

## 2013-09-19 DIAGNOSIS — K2990 Gastroduodenitis, unspecified, without bleeding: Secondary | ICD-10-CM | POA: Diagnosis not present

## 2013-09-19 DIAGNOSIS — K921 Melena: Secondary | ICD-10-CM | POA: Diagnosis not present

## 2013-09-19 DIAGNOSIS — K920 Hematemesis: Secondary | ICD-10-CM | POA: Diagnosis not present

## 2013-09-19 HISTORY — PX: ESOPHAGOGASTRODUODENOSCOPY: SHX5428

## 2013-09-19 LAB — CBC
HCT: 32.7 % — ABNORMAL LOW (ref 39.0–52.0)
Hemoglobin: 10.2 g/dL — ABNORMAL LOW (ref 13.0–17.0)
MCH: 25.5 pg — ABNORMAL LOW (ref 26.0–34.0)
MCHC: 31.2 g/dL (ref 30.0–36.0)
MCV: 81.8 fL (ref 78.0–100.0)
Platelets: 460 10*3/uL — ABNORMAL HIGH (ref 150–400)
RBC: 4 MIL/uL — ABNORMAL LOW (ref 4.22–5.81)
RDW: 16.5 % — ABNORMAL HIGH (ref 11.5–15.5)
WBC: 10.1 10*3/uL (ref 4.0–10.5)

## 2013-09-19 LAB — I-STAT CHEM 8, ED
BUN: 44 mg/dL — ABNORMAL HIGH (ref 6–23)
Calcium, Ion: 1.16 mmol/L (ref 1.12–1.23)
Chloride: 107 mEq/L (ref 96–112)
Creatinine, Ser: 0.9 mg/dL (ref 0.50–1.35)
Glucose, Bld: 133 mg/dL — ABNORMAL HIGH (ref 70–99)
HCT: 35 % — ABNORMAL LOW (ref 39.0–52.0)
Hemoglobin: 11.9 g/dL — ABNORMAL LOW (ref 13.0–17.0)
Potassium: 4.8 mEq/L (ref 3.7–5.3)
Sodium: 137 mEq/L (ref 137–147)
TCO2: 20 mmol/L (ref 0–100)

## 2013-09-19 LAB — HEMOGLOBIN AND HEMATOCRIT, BLOOD
HCT: 27.1 % — ABNORMAL LOW (ref 39.0–52.0)
HCT: 24.8 % — ABNORMAL LOW (ref 39.0–52.0)
Hemoglobin: 8.4 g/dL — ABNORMAL LOW (ref 13.0–17.0)
Hemoglobin: 7.8 g/dL — ABNORMAL LOW (ref 13.0–17.0)

## 2013-09-19 LAB — COMPREHENSIVE METABOLIC PANEL
ALT: 18 U/L (ref 0–53)
AST: 13 U/L (ref 0–37)
Albumin: 3.7 g/dL (ref 3.5–5.2)
Alkaline Phosphatase: 92 U/L (ref 39–117)
Anion gap: 17 — ABNORMAL HIGH (ref 5–15)
BUN: 45 mg/dL — ABNORMAL HIGH (ref 6–23)
CO2: 20 mEq/L (ref 19–32)
Calcium: 8.9 mg/dL (ref 8.4–10.5)
Chloride: 101 mEq/L (ref 96–112)
Creatinine, Ser: 0.72 mg/dL (ref 0.50–1.35)
GFR calc Af Amer: >90 mL/min (ref >90)
GFR calc non Af Amer: >90 mL/min (ref >90)
Glucose, Bld: 137 mg/dL — ABNORMAL HIGH (ref 70–99)
Potassium: 4.9 mEq/L (ref 3.7–5.3)
Sodium: 138 mEq/L (ref 137–147)
Total Bilirubin: 0.4 mg/dL (ref 0.3–1.2)
Total Protein: 7.1 g/dL (ref 6.0–8.3)

## 2013-09-19 LAB — POC OCCULT BLOOD, ED: Fecal Occult Bld: POSITIVE — AB

## 2013-09-19 LAB — ABO/RH: ABO/RH(D): A POS

## 2013-09-19 LAB — PROTIME-INR
INR: 0.95 (ref 0.00–1.49)
Prothrombin Time: 12.7 seconds (ref 11.6–15.2)

## 2013-09-19 LAB — PREPARE RBC (CROSSMATCH)

## 2013-09-19 SURGERY — EGD (ESOPHAGOGASTRODUODENOSCOPY)
Anesthesia: General | Laterality: Left

## 2013-09-19 MED ORDER — SODIUM CHLORIDE 0.9 % IV SOLN: INTRAVENOUS | Status: DC

## 2013-09-19 MED ORDER — ONDANSETRON HCL 4 MG/2ML IJ SOLN: 4.0000 mg | Freq: Once | INTRAMUSCULAR | Status: DC | PRN

## 2013-09-19 MED ORDER — SUCCINYLCHOLINE CHLORIDE 20 MG/ML IJ SOLN
INTRAMUSCULAR | Status: DC | PRN
Start: 2013-09-19 — End: 2013-09-19
  Administered 2013-09-19: 120 mg via INTRAVENOUS

## 2013-09-19 MED ORDER — FENTANYL CITRATE 0.05 MG/ML IJ SOLN
100.0000 ug | Freq: Once | INTRAMUSCULAR | Status: AC
Start: 2013-09-19 — End: 2013-09-19
  Administered 2013-09-19: 100 ug via INTRAVENOUS
  Filled 2013-09-19: qty 2

## 2013-09-19 MED ORDER — LIDOCAINE HCL 4 % MT SOLN
OROMUCOSAL | Status: DC | PRN
  Administered 2013-09-19: 4 mL via TOPICAL

## 2013-09-19 MED ORDER — ONDANSETRON HCL 4 MG/2ML IJ SOLN
INTRAMUSCULAR | Status: DC | PRN
  Administered 2013-09-19: 4 mg via INTRAVENOUS

## 2013-09-19 MED ORDER — HYDROCODONE-ACETAMINOPHEN 10-325 MG PO TABS
1.0000 | ORAL_TABLET | Freq: Four times a day (QID) | ORAL | Status: DC | PRN
  Administered 2013-09-19 – 2013-09-20 (×5): 1 via ORAL
  Filled 2013-09-19 (×5): qty 1

## 2013-09-19 MED ORDER — ONDANSETRON HCL 4 MG PO TABS: 4.0000 mg | ORAL_TABLET | Freq: Four times a day (QID) | ORAL | Status: DC | PRN

## 2013-09-19 MED ORDER — SODIUM CHLORIDE 0.9 % IV BOLUS (SEPSIS)
2000.0000 mL | Freq: Once | INTRAVENOUS | Status: AC
  Administered 2013-09-19: 2000 mL via INTRAVENOUS

## 2013-09-19 MED ORDER — PANTOPRAZOLE SODIUM 40 MG IV SOLR
40.0000 mg | Freq: Two times a day (BID) | INTRAVENOUS | Status: DC
  Administered 2013-09-19 – 2013-09-21 (×5): 40 mg via INTRAVENOUS
  Filled 2013-09-19 (×6): qty 40

## 2013-09-19 MED ORDER — LACTATED RINGERS IV SOLN
INTRAVENOUS | Status: DC | PRN
  Administered 2013-09-19: 13:00:00 via INTRAVENOUS

## 2013-09-19 MED ORDER — SODIUM CHLORIDE 0.9 % IV BOLUS (SEPSIS): 1000.0000 mL | Freq: Once | INTRAVENOUS | Status: DC

## 2013-09-19 MED ORDER — METOPROLOL TARTRATE 25 MG PO TABS
25.0000 mg | ORAL_TABLET | Freq: Two times a day (BID) | ORAL | Status: DC
  Administered 2013-09-19: 25 mg via ORAL
  Filled 2013-09-19 (×3): qty 1

## 2013-09-19 MED ORDER — FENTANYL CITRATE 0.05 MG/ML IJ SOLN
INTRAMUSCULAR | Status: AC
  Filled 2013-09-19: qty 4

## 2013-09-19 MED ORDER — SODIUM CHLORIDE 0.9 % IJ SOLN
3.0000 mL | Freq: Two times a day (BID) | INTRAMUSCULAR | Status: DC
  Administered 2013-09-19 – 2013-09-21 (×4): 3 mL via INTRAVENOUS

## 2013-09-19 MED ORDER — ONDANSETRON HCL 4 MG/2ML IJ SOLN: 4.0000 mg | Freq: Four times a day (QID) | INTRAMUSCULAR | Status: DC | PRN

## 2013-09-19 MED ORDER — ACETAMINOPHEN 325 MG PO TABS: 650.0000 mg | ORAL_TABLET | Freq: Four times a day (QID) | ORAL | Status: DC | PRN

## 2013-09-19 MED ORDER — PROPOFOL 10 MG/ML IV BOLUS
INTRAVENOUS | Status: DC | PRN
  Administered 2013-09-19: 200 mg via INTRAVENOUS

## 2013-09-19 MED ORDER — ALPRAZOLAM 0.5 MG PO TABS
0.5000 mg | ORAL_TABLET | Freq: Once | ORAL | Status: AC
  Administered 2013-09-20: 0.5 mg via ORAL
  Filled 2013-09-19: qty 1

## 2013-09-19 MED ORDER — DIPHENHYDRAMINE HCL 50 MG/ML IJ SOLN
INTRAMUSCULAR | Status: AC
  Filled 2013-09-19: qty 1

## 2013-09-19 MED ORDER — ONDANSETRON HCL 4 MG/2ML IJ SOLN
4.0000 mg | Freq: Once | INTRAMUSCULAR | Status: AC
  Administered 2013-09-19: 4 mg via INTRAVENOUS
  Filled 2013-09-19: qty 2

## 2013-09-19 MED ORDER — HYDROMORPHONE HCL PF 1 MG/ML IJ SOLN
0.2500 mg | INTRAMUSCULAR | Status: DC | PRN
Start: 2013-09-19 — End: 2013-09-19

## 2013-09-19 MED ORDER — PHENYLEPHRINE HCL 10 MG/ML IJ SOLN
INTRAMUSCULAR | Status: DC | PRN
  Administered 2013-09-19 (×2): 80 ug via INTRAVENOUS

## 2013-09-19 MED ORDER — PANTOPRAZOLE SODIUM 40 MG IV SOLR
40.0000 mg | Freq: Once | INTRAVENOUS | Status: AC
  Administered 2013-09-19: 40 mg via INTRAVENOUS
  Filled 2013-09-19: qty 40

## 2013-09-19 MED ORDER — MIDAZOLAM HCL 5 MG/ML IJ SOLN
INTRAMUSCULAR | Status: AC
  Filled 2013-09-19: qty 3

## 2013-09-19 MED ORDER — FENTANYL CITRATE 0.05 MG/ML IJ SOLN
INTRAMUSCULAR | Status: DC | PRN
  Administered 2013-09-19: 50 ug via INTRAVENOUS

## 2013-09-19 MED ORDER — PANTOPRAZOLE SODIUM 40 MG IV SOLR: 40.0000 mg | Freq: Two times a day (BID) | INTRAVENOUS | Status: DC

## 2013-09-19 MED ORDER — ACETAMINOPHEN 650 MG RE SUPP: 650.0000 mg | Freq: Four times a day (QID) | RECTAL | Status: DC | PRN

## 2013-09-19 NOTE — ED Provider Notes (Addendum)
CSN: [id number]     Arrival date & time 09/19/13  1124 History   First MD Initiated Contact with Patient 09/19/13 1126     No chief complaint on file.    (Consider location/radiation/quality/duration/timing/severity/associated sxs/prior Treatment) HPI 43 year old male presents with multiple episodes of vomiting black vomit and 2 episodes of black stools. Patient states he's had some upper abdominal discomfort/nausea as well. He went to his primary doctor sent him to the ER for evaluation. The patient denies any liver disease, significant alcohol use, or use of blood thinners. He had an EGD and colonoscopy in 2013 for anemia that showed a hiatal hernia and likely internal hemorrhoids. The patient states he's also having chronic right leg pain that is unchanged from normal. He was unable to take his normal 10 mg hydrocodone this morning. Rates that pain as a 6/10.  Past Medical History  Diagnosis Date  . Hypertension   . GERD (gastroesophageal reflux disease)   . H/O hiatal hernia    Past Surgical History  Procedure Laterality Date  . Appendectomy      as child  . Joint replacement  2005    Right Hip  . Esophagogastroduodenoscopy (egd) with propofol  02/17/2012    Procedure: ESOPHAGOGASTRODUODENOSCOPY (EGD) WITH PROPOFOL;  Surgeon: Willis ModenaWilliam Outlaw, MD;  Location: [facility] ENDOSCOPY;  Service: Endoscopy;  Laterality: N/A;  . Colonoscopy with propofol  02/17/2012    Procedure: COLONOSCOPY WITH PROPOFOL;  Surgeon: Willis ModenaWilliam Outlaw, MD;  Location: [facility] ENDOSCOPY;  Service: Endoscopy;  Laterality: N/A;   No family history on file. History  Substance Use Topics  . Smoking status: Never Smoker   . Smokeless tobacco: Never Used  . Alcohol Use: No    Review of Systems  Gastrointestinal: Positive for nausea, vomiting, abdominal pain and diarrhea.       Black stools  Musculoskeletal:       Chronic right leg pain  Skin: Positive for pallor.      Allergies  Review of patient's allergies  indicates no known allergies.  Home Medications   Prior to Admission medications   Medication Sig Start Date End Date Taking? Authorizing Provider  ferrous gluconate (FERGON) 325 MG tablet Take 325 mg by mouth daily with breakfast.    Historical Provider, MD  HYDROcodone-acetaminophen (NORCO) 10-325 MG per tablet Take 1 tablet by mouth every 6 (six) hours as needed. For pain    Historical Provider, MD  loratadine (CLARITIN) 10 MG tablet Take 10 mg by mouth daily.    Historical Provider, MD  metoprolol (LOPRESSOR) 50 MG tablet Take 50 mg by mouth 2 (two) times daily.    Historical Provider, MD  omeprazole (PRILOSEC) 40 MG capsule Take 40 mg by mouth daily.    Historical Provider, MD  simvastatin (ZOCOR) 40 MG tablet Take 40 mg by mouth every evening.    Historical Provider, MD   BP 128/75  Pulse 146  Temp(Src) 98.6 F (37 C) (Oral)  Resp 25  SpO2 97% Physical Exam  Nursing note and vitals reviewed. Constitutional: He is oriented to person, place, and time. He appears well-developed and well-nourished.  HENT:  Head: Normocephalic and atraumatic.  Right Ear: External ear normal.  Left Ear: External ear normal.  Nose: Nose normal.  Eyes: Right eye exhibits no discharge. Left eye exhibits no discharge.  Neck: Neck supple.  Cardiovascular: Regular rhythm, normal heart sounds and intact distal pulses.  Tachycardia present.   Pulmonary/Chest: Effort normal and breath sounds normal.  Abdominal: Soft. There is no  tenderness.  Genitourinary: Rectal exam shows no external hemorrhoid. Guaiac positive stool (black stool that is heme-positive).  Musculoskeletal: He exhibits no edema.  Neurological: He is alert and oriented to person, place, and time.  Skin: Skin is warm and dry. There is pallor.    ED Course  Procedures (including critical care time) Labs Review Labs Reviewed  CBC - Abnormal; Notable for the following:    RBC 4.00 (*)    Hemoglobin 10.2 (*)    HCT 32.7 (*)    MCH 25.5  (*)    RDW 16.5 (*)    Platelets 460 (*)    All other components within normal limits  COMPREHENSIVE METABOLIC PANEL - Abnormal; Notable for the following:    Glucose, Bld 137 (*)    BUN 45 (*)    Anion gap 17 (*)    All other components within normal limits  I-STAT CHEM 8, ED - Abnormal; Notable for the following:    BUN 44 (*)    Glucose, Bld 133 (*)    Hemoglobin 11.9 (*)    HCT 35.0 (*)    All other components within normal limits  POC OCCULT BLOOD, ED - Abnormal; Notable for the following:    Fecal Occult Bld POSITIVE (*)    All other components within normal limits  PROTIME-INR  TYPE AND SCREEN  PREPARE RBC (CROSSMATCH)    Imaging Review No results found.   EKG Interpretation   Date/Time:  Tuesday September 19 2013 12:03:38 EDT Ventricular Rate:  138 PR Interval:  127 QRS Duration: 72 QT Interval:  295 QTC Calculation: 447 R Axis:   68 Text Interpretation:  Sinus tachycardia Ventricular premature complex  Aberrant complex besides tachycardia the EKG is unchanged from 2005  Confirmed by Avrey Flanagin  MD, Kasper Mudrick ([id number]) on 09/19/2013 12:29:15 PM      Angiocath insertion Performed by: Pricilla Loveless T  Consent: Verbal consent obtained. Risks and benefits: risks, benefits and alternatives were discussed Time out: Immediately prior to procedure a "time out" was called to verify the correct patient, procedure, equipment, support staff and site/side marked as required.  Preparation: Patient was prepped and draped in the usual sterile fashion.  Vein Location: right basilic  Ultrasound Guided  Gauge: 18  Normal blood return and flush without difficulty Patient tolerance: Patient tolerated the procedure well with no immediate complications.    Angiocath insertion Performed by: Pricilla Loveless T  Consent: Verbal consent obtained. Risks and benefits: risks, benefits and alternatives were discussed Time out: Immediately prior to procedure a "time out" was called to  verify the correct patient, procedure, equipment, support staff and site/side marked as required.  Preparation: Patient was prepped and draped in the usual sterile fashion.  Vein Location: left basilic  Ultrasound Guided  Gauge: 18  Normal blood return and flush without difficulty Patient tolerance: Patient tolerated the procedure well with no immediate complications.    CRITICAL CARE Performed by: Pricilla Loveless T   Total critical care time: 30 minutes  Critical care time was exclusive of separately billable procedures and treating other patients.  Critical care was necessary to treat or prevent imminent or life-threatening deterioration.  Critical care was time spent personally by me on the following activities: development of treatment plan with patient and/or surrogate as well as nursing, discussions with consultants, evaluation of patient's response to treatment, examination of patient, obtaining history from patient or surrogate, ordering and performing treatments and interventions, ordering and review of laboratory studies, ordering and review  of radiographic studies, pulse oximetry and re-evaluation of patient's condition.  MDM   Final diagnoses:  Acute GI bleeding    Patient has no hypotension here but does have significant tachycardia with a heart rate in the 140s. He was given IV fluids and 2 ultrasound IVs were put in by myself. The patient appears quite pale and likely has a significant GI bleed. At this point his hemoglobin is 10 is likely falsely elevated based on his dehydration. However his heart rate is improving and he has no hypotension so wait on transfusion at this time. Discussed with Dr. Dulce Sellar GI who will taken to the endoscopy suite to rule out gastric ulcer. He is stable for this transfer at this time. I believe he is stable for the step down. I've contacted the hospitalist who will evaluate the patient in the endoscopy suite for admission. The patient was  also given protonix and Zofran. He was given pain medicine for his chronic pain.    Audree Camel, MD 09/19/13 1550  Audree Camel, MD 09/19/13 [phone]

## 2013-09-19 NOTE — Consult Note (Signed)
Date: 09/19/2013               Patient Name:  Todd [name] MRN: [id number]  DOB: January 07, 1971 Age / Sex: 43 y.o., male   PCP: Cala Bradford, MD         Requesting Physician: Dr. Audree Camel, MD    Consulting Reason:  GI bleed     Chief Complaint: abdominal pain, hemetemesis, melena  History of Present Illness: Todd [name] is a 43 year old man with history of GERD, paraesophageal hernia presents with abdominal pain, hematemesis, melena x 1 day. He woke up with the abdominal pain this morning. It was periumbilical, dull, cramping, constant, 4-5/10 at worst. He had emesis x 3 that was black liquid. Denies bright red blood. He had BM x 2 with black liquid, no bright red blood. Denies similar episode in past. Had pizza for dinner last night. Denies NSAID or blood thinner use. Last colonoscopy 02/17/2012 demonstrated internal hemorrhoids. Last EGD 02/17/2012 demonstrated paraesophageal hernia.  Meds: Current Facility-Administered Medications  Medication Dose Route Frequency Provider Last Rate Last Dose  . sodium chloride 0.9 % bolus 1,000 mL  1,000 mL Intravenous Once Audree Camel, MD       Current Outpatient Prescriptions  Medication Sig Dispense Refill  . ALPRAZolam (XANAX) 1 MG tablet Take 1 mg by mouth at bedtime as needed for anxiety. *may repeat in 3-4 hrs if needed*      . atorvastatin (LIPITOR) 40 MG tablet Take 40 mg by mouth daily.      . Fe Fum-FA-B Cmp-C-Zn-Mg-Mn-Cu (HEMOCYTE-PLUS PO) Take 1 capsule by mouth daily.      Marland Kitchen HYDROcodone-acetaminophen (NORCO) 10-325 MG per tablet Take 1 tablet by mouth every 6 (six) hours as needed. For pain      . loratadine (CLARITIN) 10 MG tablet Take 10 mg by mouth daily as needed for allergies.       . metoprolol (LOPRESSOR) 50 MG tablet Take 50 mg by mouth 2 (two) times daily.      Marland Kitchen omeprazole (PRILOSEC) 40 MG capsule Take 40 mg by mouth daily.        Allergies: Allergies as of 09/19/2013  . (No Known Allergies)   Past Medical  History  Diagnosis Date  . Hypertension   . GERD (gastroesophageal reflux disease)   . H/O hiatal hernia    Past Surgical History  Procedure Laterality Date  . Appendectomy      as child  . Joint replacement  2005    Right Hip  . Esophagogastroduodenoscopy (egd) with propofol  02/17/2012    Procedure: ESOPHAGOGASTRODUODENOSCOPY (EGD) WITH PROPOFOL;  Surgeon: Willis Modena, MD;  Location: [facility];  Service: Endoscopy;  Laterality: N/A;  . Colonoscopy with propofol  02/17/2012    Procedure: COLONOSCOPY WITH PROPOFOL;  Surgeon: Willis Modena, MD;  Location: [facility];  Service: Endoscopy;  Laterality: N/A;   No family history on file. History   Social History  . Marital Status: Married    Spouse Name: N/A    Number of Children: N/A  . Years of Education: N/A   Occupational History  . Not on file.   Social History Main Topics  . Smoking status: Never Smoker   . Smokeless tobacco: Never Used  . Alcohol Use: No  . Drug Use: No  . Sexual Activity:    Other Topics Concern  . Not on file   Social History Narrative  . No narrative on file    Review of Systems:  A comprehensive review of systems was negative. except chronic right leg pain and per HPI.  Physical Exam: Blood pressure 135/65, pulse 130, temperature 98.6 F (37 C), temperature source Oral, resp. rate 29, height 6\' 2"  (1.88 m), weight 260 lb (117.935 kg), SpO2 100.00%. BP 135/96  Pulse 131  Temp(Src) 98.1 F (36.7 C) (Oral)  Resp 19  Ht 6\' 2"  (1.88 m)  Wt 260 lb (117.935 kg)  BMI 33.37 kg/m2  SpO2 99%  General Appearance:    Alert, cooperative, no distress, appears stated age  HEENT:    Normocephalic, without obvious abnormality, atraumatic, EOMI, MMM, anicteric sclera  Neck:   Supple, symmetrical, trachea midline, no adenopathy  Lungs:     Clear to auscultation bilaterally, respirations unlabored  Heart:    tachycardic rate and regular rhythm, S1 and S2 normal, no murmur, rub   or gallop    Abdomen:     Soft, mild diffuse tenderness, bowel sounds active all four quadrants, no masses, no organomegaly  Extremities:   Extremities normal, atraumatic, no cyanosis or edema  Skin:   Skin color, texture, turgor normal, no rashes or lesions  Neurologic:   No gross deficits    Lab results: Basic Metabolic Panel:  Recent Labs  02/72/53 1155 09/19/13 1201  NA 138 137  K 4.9 4.8  CL 101 107  CO2 20  --   GLUCOSE 137* 133*  BUN 45* 44*  CREATININE 0.72 0.90  CALCIUM 8.9  --    Liver Function Tests:  Recent Labs  09/19/13 1155  AST 13  ALT 18  ALKPHOS 92  BILITOT 0.4  PROT 7.1  ALBUMIN 3.7   CBC:  Recent Labs  09/19/13 1155 09/19/13 1201  WBC 10.1  --   HGB 10.2* 11.9*  HCT 32.7* 35.0*  MCV 81.8  --   PLT 460*  --    Coagulation:  Recent Labs  09/19/13 1155  LABPROT 12.7  INR 0.95   Misc. Labs: Fecal occult blood test: positive  Assessment, Plan, & Recommendations by Problem: 43 year old man with history of GERD, paraesophageal hernia presents with abdominal pain, hematemesis, melena x 1 day. Tachycardic. Elevated BUN. Hgb 11.9 on admission, repeat 10.2. Fecal occult blood positive. Concern for upper GI bleed.  -EGD today to evaluate for source of bleed  Patient discussed with attending physician, Dr. Dulce Sellar  Signed: Griffin Basil, MD 09/19/2013, 1:07 PM

## 2013-09-19 NOTE — H&P (View-Only) (Signed)
[facility] Consultation Note  Referring Provider:  Dr. Criss Alvine (Emergency Department) and Critical Care Primary Care Physician:  Cala Bradford, MD Primary Gastroenterologist:  Dr. Willis Modena  Reason for Consultation:  Hematemesis, melena  HPI: Todd [name] is a 43 y.o. male with one-day history of black emesis and melena.  Started abruptly this morning.  No prior bleeding.  Is on iron for chronic anemia, but stools today much different than the darker stools he gets with iron.  No bright red hematemesis or hematochezia.  No abdominal pain.  Denies NSAIDs.  Had endoscopy and colonoscopy in December 2013, showing paraesophageal hernia and internal hemorrhoids, otherwise normal.   Past Medical History  Diagnosis Date  . Hypertension   . GERD (gastroesophageal reflux disease)   . H/O hiatal hernia     Past Surgical History  Procedure Laterality Date  . Appendectomy      as child  . Joint replacement  2005    Right Hip  . Esophagogastroduodenoscopy (egd) with propofol  02/17/2012    Procedure: ESOPHAGOGASTRODUODENOSCOPY (EGD) WITH PROPOFOL;  Surgeon: Willis Modena, MD;  Location: [facility];  Service: Endoscopy;  Laterality: N/A;  . Colonoscopy with propofol  02/17/2012    Procedure: COLONOSCOPY WITH PROPOFOL;  Surgeon: Willis Modena, MD;  Location: [facility];  Service: Endoscopy;  Laterality: N/A;    Prior to Admission medications   Medication Sig Start Date End Date Taking? Authorizing Provider  ALPRAZolam Prudy Feeler) 1 MG tablet Take 1 mg by mouth at bedtime as needed for anxiety. *may repeat in 3-4 hrs if needed*   Yes Historical Provider, MD  atorvastatin (LIPITOR) 40 MG tablet Take 40 mg by mouth daily.   Yes Historical Provider, MD  Fe Fum-FA-B Cmp-C-Zn-Mg-Mn-Cu (HEMOCYTE-PLUS PO) Take 1 capsule by mouth daily.   Yes Historical Provider, MD  HYDROcodone-acetaminophen (NORCO) 10-325 MG per tablet Take 1 tablet by mouth every 6 (six) hours as needed. For pain    Yes Historical Provider, MD  loratadine (CLARITIN) 10 MG tablet Take 10 mg by mouth daily as needed for allergies.    Yes Historical Provider, MD  metoprolol (LOPRESSOR) 50 MG tablet Take 50 mg by mouth 2 (two) times daily.   Yes Historical Provider, MD  omeprazole (PRILOSEC) 40 MG capsule Take 40 mg by mouth daily.   Yes Historical Provider, MD    Current Facility-Administered Medications  Medication Dose Route Frequency Provider Last Rate Last Dose  . sodium chloride 0.9 % bolus 1,000 mL  1,000 mL Intravenous Once Audree Camel, MD       Current Outpatient Prescriptions  Medication Sig Dispense Refill  . ALPRAZolam (XANAX) 1 MG tablet Take 1 mg by mouth at bedtime as needed for anxiety. *may repeat in 3-4 hrs if needed*      . atorvastatin (LIPITOR) 40 MG tablet Take 40 mg by mouth daily.      . Fe Fum-FA-B Cmp-C-Zn-Mg-Mn-Cu (HEMOCYTE-PLUS PO) Take 1 capsule by mouth daily.      Marland Kitchen HYDROcodone-acetaminophen (NORCO) 10-325 MG per tablet Take 1 tablet by mouth every 6 (six) hours as needed. For pain      . loratadine (CLARITIN) 10 MG tablet Take 10 mg by mouth daily as needed for allergies.       . metoprolol (LOPRESSOR) 50 MG tablet Take 50 mg by mouth 2 (two) times daily.      Marland Kitchen omeprazole (PRILOSEC) 40 MG capsule Take 40 mg by mouth daily.        Allergies  as of 09/19/2013  . (No Known Allergies)    No family history on file.  History   Social History  . Marital Status: Married    Spouse Name: N/A    Number of Children: N/A  . Years of Education: N/A   Occupational History  . Not on file.   Social History Main Topics  . Smoking status: Never Smoker   . Smokeless tobacco: Never Used  . Alcohol Use: No  . Drug Use: No  . Sexual Activity:    Other Topics Concern  . Not on file   Social History Narrative  . No narrative on file    Review of Systems: As per HPI, all others negative.  Physical Exam: Vital signs in last 24 hours: Temp:  [98.6 F (37 C)] 98.6  F (37 C) (07/14 1130) Pulse Rate:  [124-149] 126 (07/14 1300) Resp:  [22-39] 26 (07/14 1300) BP: (126-144)/(65-79) 128/75 mmHg (07/14 1300) SpO2:  [98 %-100 %] 100 % (07/14 1300) Weight:  [117.935 kg (260 lb)] 117.935 kg (260 lb) (07/14 1221)   General:   Alert,  Well-developed, well-nourished, pleasant and cooperative in NAD Head:  Normocephalic and atraumatic. Eyes:  Sclera clear, no icterus.   Conjunctiva pink. Ears:  Normal auditory acuity. Nose:  No deformity, discharge,  or lesions. Mouth:  No deformity or lesions.  Oropharynx pink but somewhat dry. Neck:  Supple; no masses or thyromegaly. Lungs:  Clear throughout to auscultation.   No wheezes, crackles, or rhonchi. No acute distress. Heart:  Regular rate and rhythm; no murmurs, clicks, rubs,  or gallops. Abdomen:  Soft, mild protuberant, non-tender, non-distended, No masses, hepatosplenomegaly or hernias noted. Normal bowel sounds, without guarding, and without rebound.     Msk:  Symmetrical without gross deformities. Normal posture. Pulses:  Normal pulses noted. Extremities:  Without clubbing or edema. Neurologic:  Alert and  oriented x4;  grossly normal neurologically. Skin:  Pale-appearing, multiple tattooes, otherwise intact without significant lesions or rashes. Psych:  Alert and cooperative. Normal mood and affect.   Lab Results:  Recent Labs  09/19/13 1155 09/19/13 1201  WBC 10.1  --   HGB 10.2* 11.9*  HCT 32.7* 35.0*  PLT 460*  --    BMET  Recent Labs  09/19/13 1155 09/19/13 1201  NA 138 137  K 4.9 4.8  CL 101 107  CO2 20  --   GLUCOSE 137* 133*  BUN 45* 44*  CREATININE 0.72 0.90  CALCIUM 8.9  --    LFT  Recent Labs  09/19/13 1155  PROT 7.1  ALBUMIN 3.7  AST 13  ALT 18  ALKPHOS 92  BILITOT 0.4   PT/INR  Recent Labs  09/19/13 1155  LABPROT 12.7  INR 0.95    Studies/Results: No results found.  Impression:  1.  Black emesis and melena.  In conjunction with elevated BUN, this  is overall worrisome for upper GI bleed.  Given tachycardia, patient appears to be somewhat volume-depleted, which is being addressed with aggressive administration of intravenous fluids. 2.  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  Plan:  1.  Aggressive intravenous fluids. 2.  Protonix. 3.  Expedited endoscopy today for further evaluation. 4.  Risks (bleeding, infection, bowel perforation that could require surgery, sedation-related changes in cardiopulmonary systems), benefits (identification and possible treatment of source of symptoms, exclusion of certain causes of symptoms), and alternatives (watchful  waiting, radiographic imaging studies, empiric medical treatment) of upper endoscopy (EGD) were explained to patient/family in detail and patient wishes to proceed.   LOS: 0 days   Deavin Forst M  09/19/2013, 1:15 PM

## 2013-09-19 NOTE — Op Note (Signed)
[name] [address] [place], [id number]   ENDOSCOPY PROCEDURE REPORT  PATIENT: Todd [name], Todd D.  MR#: [id number] BIRTHDATE: 02/26/1971 , 42  yrs. old GENDER: Male ENDOSCOPIST: Willis ModenaWilliam Sebastyan Snodgrass, MD REFERRED BY:  [facility] PROCEDURE DATE:  09/19/2013 PROCEDURE:  EGD, diagnostic ASA CLASS:     Class II INDICATIONS:  hematemesis, melena. MEDICATIONS: General endotracheal anesthesia (GETA)  DESCRIPTION OF PROCEDURE: After the risks benefits and alternatives of the procedure were thoroughly explained, informed consent was obtained.  The Pentax Gastroscope [id number] endoscope was introduced through the mouth and advanced to the second portion of the duodenum. Without limitations.  The instrument was slowly withdrawn as the mucosa was fully examined.     Findings:  Normal-appearing esophagus; specifically, no esophagitis, varices, or Mallory-Weiss tear were identified.  Stomach with large amount of food and dark material, especially in the proximal stomach.  Extensive suctioning improved views, but there were still compromised views of the cardia due to pieces of solid food matter. In the proximal stomach, along the lesser curvature of the cardia, a 3-cm long erosion versus ulceration, with some mild oozing, was identified.  There was no adherent clot or active bleeding witnessed.  Remainder of the stomach, pylorus and duodenum to the second portion were normal.              The scope was then withdrawn from the patient and the procedure completed.  ENDOSCOPIC IMPRESSION:     As above.  Linear erosion along proximal portion of lesser curvature, with some mild oozing, noted.  Suspect this is source of bleeding, but pathology in midst of the retained food in the proximal stomach can't be entirely excluded (but is seemingly very unlikely).  No fresh blood seen.  No obviously identifiable old blood could be appreciated, either.  RECOMMENDATIONS:      1.  Watch for potential complications of procedure. 2.  PPI IV bid x 24 hours, then transition to oral PPI thereafter. 3.  Clear liquid diet ok, do not advance today. 4.  If no rebleeding, would not rescope; if does have rebleeding, would repeat endoscopy. 5.  Either way, patient would likely benefit from repeat endoscopy in 2-3 months to assess for healing of his large proximal gastric linear erosion. 6.  [facility] will follow.  eSigned:  Willis ModenaWilliam Meryle Pugmire, MD 09/19/2013 2:36 PM   CC:

## 2013-09-19 NOTE — Anesthesia Postprocedure Evaluation (Signed)
  Anesthesia Post-op Note  Patient: Todd [name]  Procedure(s) Performed: Procedure(s): ESOPHAGOGASTRODUODENOSCOPY (EGD) (Left)  Patient Location: Endoscopy   Anesthesia Type:General  Level of Consciousness: awake, alert  and oriented  Airway and Oxygen Therapy: Patient Spontanous Breathing and Patient connected to nasal cannula oxygen  Post-op Pain: none  Post-op Assessment: Post-op Vital signs reviewed, Patient's Cardiovascular Status Stable, Respiratory Function Stable, Patent Airway and Pain level controlled  Post-op Vital Signs: stable  Last Vitals:  Filed Vitals:   09/19/13 1600  BP: 121/94  Pulse: 108  Temp:   Resp: 16    Complications: No apparent anesthesia complications

## 2013-09-19 NOTE — Interval H&P Note (Signed)
History and Physical Interval Note:  09/19/2013 1:29 PM  Todd [name]  has presented today for surgery, with the diagnosis of hematemesis, melena  The various methods of treatment have been discussed with the patient and family. After consideration of risks, benefits and other options for treatment, the patient has consented to  Procedure(s): ESOPHAGOGASTRODUODENOSCOPY (EGD) (Left) as a surgical intervention .  The patient's history has been reviewed, patient examined, no change in status, stable for surgery.  I have reviewed the patient's chart and labs.  Questions were answered to the patient's satisfaction.     Meridian Scherger M  Assessment:  1.  Hematemesis, melena.  Plan:  1.  Endoscopy for possible hemostatic therapy. 2.  Risks (bleeding, infection, bowel perforation that could require surgery, sedation-related changes in cardiopulmonary systems), benefits (identification and possible treatment of source of symptoms, exclusion of certain causes of symptoms), and alternatives (watchful waiting, radiographic imaging studies, empiric medical treatment) of upper endoscopy (EGD) were explained to patient/family in detail and patient wishes to proceed.

## 2013-09-19 NOTE — Anesthesia Preprocedure Evaluation (Addendum)
Anesthesia Evaluation  Patient identified by MRN, date of birth, ID band Patient awake    Reviewed: Allergy & Precautions, H&P , NPO status , Patient's Chart, lab work & pertinent test results, reviewed documented beta blocker date and time   Airway Mallampati: II TM Distance: >3 FB Neck ROM: Full    Dental  (+) Teeth Intact, Dental Advisory Given   Pulmonary          Cardiovascular hypertension, Pt. on medications     Neuro/Psych    GI/Hepatic hiatal hernia, GERD-  Poorly Controlled,  Endo/Other    Renal/GU      Musculoskeletal   Abdominal   Peds  Hematology   Anesthesia Other Findings GI bleed ORIF HIP / Femur Hx  Reproductive/Obstetrics                          Anesthesia Physical Anesthesia Plan  ASA: II  Anesthesia Plan: General   Post-op Pain Management:    Induction: Intravenous  Airway Management Planned: Oral ETT  Additional Equipment:   Intra-op Plan:   Post-operative Plan: Extubation in OR  Informed Consent: I have reviewed the patients History and Physical, chart, labs and discussed the procedure including the risks, benefits and alternatives for the proposed anesthesia with the patient or authorized representative who has indicated his/her understanding and acceptance.   Dental advisory given  Plan Discussed with: CRNA and Anesthesiologist  Anesthesia Plan Comments:        Anesthesia Quick Evaluation

## 2013-09-19 NOTE — Consult Note (Signed)
[facility] Consultation Note  Referring Provider:  Dr. Criss Alvine (Emergency Department) and Critical Care Primary Care Physician:  Cala Bradford, MD Primary Gastroenterologist:  Dr. Willis Modena  Reason for Consultation:  Hematemesis, melena  HPI: Todd [name] is a 43 y.o. male with one-day history of black emesis and melena.  Started abruptly this morning.  No prior bleeding.  Is on iron for chronic anemia, but stools today much different than the darker stools he gets with iron.  No bright red hematemesis or hematochezia.  No abdominal pain.  Denies NSAIDs.  Had endoscopy and colonoscopy in December 2013, showing paraesophageal hernia and internal hemorrhoids, otherwise normal.   Past Medical History  Diagnosis Date  . Hypertension   . GERD (gastroesophageal reflux disease)   . H/O hiatal hernia     Past Surgical History  Procedure Laterality Date  . Appendectomy      as child  . Joint replacement  2005    Right Hip  . Esophagogastroduodenoscopy (egd) with propofol  02/17/2012    Procedure: ESOPHAGOGASTRODUODENOSCOPY (EGD) WITH PROPOFOL;  Surgeon: Willis Modena, MD;  Location: [facility];  Service: Endoscopy;  Laterality: N/A;  . Colonoscopy with propofol  02/17/2012    Procedure: COLONOSCOPY WITH PROPOFOL;  Surgeon: Willis Modena, MD;  Location: [facility];  Service: Endoscopy;  Laterality: N/A;    Prior to Admission medications   Medication Sig Start Date End Date Taking? Authorizing Provider  ALPRAZolam Prudy Feeler) 1 MG tablet Take 1 mg by mouth at bedtime as needed for anxiety. *may repeat in 3-4 hrs if needed*   Yes Historical Provider, MD  atorvastatin (LIPITOR) 40 MG tablet Take 40 mg by mouth daily.   Yes Historical Provider, MD  Fe Fum-FA-B Cmp-C-Zn-Mg-Mn-Cu (HEMOCYTE-PLUS PO) Take 1 capsule by mouth daily.   Yes Historical Provider, MD  HYDROcodone-acetaminophen (NORCO) 10-325 MG per tablet Take 1 tablet by mouth every 6 (six) hours as needed. For pain    Yes Historical Provider, MD  loratadine (CLARITIN) 10 MG tablet Take 10 mg by mouth daily as needed for allergies.    Yes Historical Provider, MD  metoprolol (LOPRESSOR) 50 MG tablet Take 50 mg by mouth 2 (two) times daily.   Yes Historical Provider, MD  omeprazole (PRILOSEC) 40 MG capsule Take 40 mg by mouth daily.   Yes Historical Provider, MD    Current Facility-Administered Medications  Medication Dose Route Frequency Provider Last Rate Last Dose  . sodium chloride 0.9 % bolus 1,000 mL  1,000 mL Intravenous Once Audree Camel, MD       Current Outpatient Prescriptions  Medication Sig Dispense Refill  . ALPRAZolam (XANAX) 1 MG tablet Take 1 mg by mouth at bedtime as needed for anxiety. *may repeat in 3-4 hrs if needed*      . atorvastatin (LIPITOR) 40 MG tablet Take 40 mg by mouth daily.      . Fe Fum-FA-B Cmp-C-Zn-Mg-Mn-Cu (HEMOCYTE-PLUS PO) Take 1 capsule by mouth daily.      Marland Kitchen HYDROcodone-acetaminophen (NORCO) 10-325 MG per tablet Take 1 tablet by mouth every 6 (six) hours as needed. For pain      . loratadine (CLARITIN) 10 MG tablet Take 10 mg by mouth daily as needed for allergies.       . metoprolol (LOPRESSOR) 50 MG tablet Take 50 mg by mouth 2 (two) times daily.      Marland Kitchen omeprazole (PRILOSEC) 40 MG capsule Take 40 mg by mouth daily.        Allergies  as of 09/19/2013  . (No Known Allergies)    No family history on file.  History   Social History  . Marital Status: Married    Spouse Name: N/A    Number of Children: N/A  . Years of Education: N/A   Occupational History  . Not on file.   Social History Main Topics  . Smoking status: Never Smoker   . Smokeless tobacco: Never Used  . Alcohol Use: No  . Drug Use: No  . Sexual Activity:    Other Topics Concern  . Not on file   Social History Narrative  . No narrative on file    Review of Systems: As per HPI, all others negative.  Physical Exam: Vital signs in last 24 hours: Temp:  [98.6 F (37 C)] 98.6  F (37 C) (07/14 1130) Pulse Rate:  [124-149] 126 (07/14 1300) Resp:  [22-39] 26 (07/14 1300) BP: (126-144)/(65-79) 128/75 mmHg (07/14 1300) SpO2:  [98 %-100 %] 100 % (07/14 1300) Weight:  [117.935 kg (260 lb)] 117.935 kg (260 lb) (07/14 1221)   General:   Alert,  Well-developed, well-nourished, pleasant and cooperative in NAD Head:  Normocephalic and atraumatic. Eyes:  Sclera clear, no icterus.   Conjunctiva pink. Ears:  Normal auditory acuity. Nose:  No deformity, discharge,  or lesions. Mouth:  No deformity or lesions.  Oropharynx pink but somewhat dry. Neck:  Supple; no masses or thyromegaly. Lungs:  Clear throughout to auscultation.   No wheezes, crackles, or rhonchi. No acute distress. Heart:  Regular rate and rhythm; no murmurs, clicks, rubs,  or gallops. Abdomen:  Soft, mild protuberant, non-tender, non-distended, No masses, hepatosplenomegaly or hernias noted. Normal bowel sounds, without guarding, and without rebound.     Msk:  Symmetrical without gross deformities. Normal posture. Pulses:  Normal pulses noted. Extremities:  Without clubbing or edema. Neurologic:  Alert and  oriented x4;  grossly normal neurologically. Skin:  Pale-appearing, multiple tattooes, otherwise intact without significant lesions or rashes. Psych:  Alert and cooperative. Normal mood and affect.   Lab Results:  Recent Labs  09/19/13 1155 09/19/13 1201  WBC 10.1  --   HGB 10.2* 11.9*  HCT 32.7* 35.0*  PLT 460*  --    BMET  Recent Labs  09/19/13 1155 09/19/13 1201  NA 138 137  K 4.9 4.8  CL 101 107  CO2 20  --   GLUCOSE 137* 133*  BUN 45* 44*  CREATININE 0.72 0.90  CALCIUM 8.9  --    LFT  Recent Labs  09/19/13 1155  PROT 7.1  ALBUMIN 3.7  AST 13  ALT 18  ALKPHOS 92  BILITOT 0.4   PT/INR  Recent Labs  09/19/13 1155  LABPROT 12.7  INR 0.95    Studies/Results: No results found.  Impression:  1.  Black emesis and melena.  In conjunction with elevated BUN, this  is overall worrisome for upper GI bleed.  Given tachycardia, patient appears to be somewhat volume-depleted, which is being addressed with aggressive administration of intravenous fluids. 2.  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  Plan:  1.  Aggressive intravenous fluids. 2.  Protonix. 3.  Expedited endoscopy today for further evaluation. 4.  Risks (bleeding, infection, bowel perforation that could require surgery, sedation-related changes in cardiopulmonary systems), benefits (identification and possible treatment of source of symptoms, exclusion of certain causes of symptoms), and alternatives (watchful  waiting, radiographic imaging studies, empiric medical treatment) of upper endoscopy (EGD) were explained to patient/family in detail and patient wishes to proceed.   LOS: 0 days   Delaila Nand M  09/19/2013, 1:15 PM

## 2013-09-19 NOTE — Anesthesia Procedure Notes (Signed)
Procedure Name: Intubation Date/Time: 09/19/2013 1:51 PM Performed by: Reine JustFLOWERS, Maecie Sevcik T Pre-anesthesia Checklist: Patient identified, Emergency Drugs available, Suction available, Patient being monitored and Timeout performed Patient Re-evaluated:Patient Re-evaluated prior to inductionOxygen Delivery Method: Circle system utilized and Simple face mask Preoxygenation: Pre-oxygenation with 100% oxygen Intubation Type: IV induction, Rapid sequence and Cricoid Pressure applied Laryngoscope Size: Miller and 3 Grade View: Grade I Tube type: Oral Tube size: 7.5 mm Number of attempts: 1 Airway Equipment and Method: Patient positioned with wedge pillow and Stylet Placement Confirmation: ETT inserted through vocal cords under direct vision,  positive ETCO2 and breath sounds checked- equal and bilateral Secured at: 23 cm Tube secured with: Tape Dental Injury: Teeth and Oropharynx as per pre-operative assessment

## 2013-09-19 NOTE — ED Notes (Signed)
Patient transported upstairs to endoscopy suite

## 2013-09-19 NOTE — Transfer of Care (Signed)
Immediate Anesthesia Transfer of Care Note  Patient: Todd [name]  Procedure(s) Performed: Procedure(s): ESOPHAGOGASTRODUODENOSCOPY (EGD) (Left)  Patient Location: PACU and Endoscopy Unit  Anesthesia Type:General  Level of Consciousness: awake, alert  and oriented  Airway & Oxygen Therapy: Patient Spontanous Breathing and Patient connected to nasal cannula oxygen  Post-op Assessment: Report given to PACU RN, Post -op Vital signs reviewed and stable and Patient moving all extremities X 4  Post vital signs: Reviewed and stable  Complications: No apparent anesthesia complications

## 2013-09-19 NOTE — H&P (Signed)
History and Physical  COREN RYER NWG:[id number] DOB: 22-Jul-1970 DOA: 09/19/2013   PCP: Cala Bradford, MD   Chief Complaint: Melena, hematemesis  HPI:  43 year old male with a history of iron deficiency anemia, GERD, paraesophageal hernia presents with one-day history of hematemesis, abdominal pain, or melena. The patient has been in his usual state of health. However, he woke up this morning with unrelenting abdominal pain. He subsequently had emesis x3 with black liquid as well as to melanotic stools without any hematochezia. The patient denies any over-the-counter drug use including NSAIDs. On 02/17/2012, the patient had a colonoscopy which revealed internal hemorrhoids. EGD on the same day revealed a paraesophageal hernia. The patient denies any alcohol or illegal drug use.  The patient was seen by Dr. Dulce Sellar in ED, and he was taken emergently to endoscopy. He was intubated for the endoscopy and given propofol. Endoscopy revealed linear erosions in the proximal lesser curvature of the stomach with some mild oozing of blood, but there was no fresh blood. Postoperatively, the patient is hemodynamically stable. Admission was requested for further observation. The patient's hemoglobin was 10.2 in the emergency department. His baseline hemoglobin is 12-13.  In the emergency department, the patient was given 2 L normal saline. He was given 1 L LR during endoscopy. He presently denies any fevers, chills, chest pain, shortness breath, dizziness, vomiting, abdominal pain. Assessment/Plan: Acute Upper GI bleed/Melena -09/19/13 endoscopy showed linear erosions in the proximal lesser curvature with some mild oozing -Continue PPI twice a day -Serial hemoglobins -Clear liquid diet Acute blood loss anemia -Presented with hemoglobin 10.2 -Baseline hemoglobin 12-13 -Serial hemoglobins -I expect some dilution effect from IVF -pt has been typed and crossed -repeat endoscopy if  rebleeds Hypertension -The patient's blood pressure is soft postoperatively in part due to the patient's anesthesia and conscious sedation as well as opioids -Will give half of his home dose of metoprolol tartrate and monitor closely Sinus tachycardia -Due to acute medical condition, volume depletion as missing metoprolol dose Iron deficiency anemia -check TIBC/iron/ferritin      Past Medical History  Diagnosis Date  . Hypertension   . GERD (gastroesophageal reflux disease)   . H/O hiatal hernia    Past Surgical History  Procedure Laterality Date  . Appendectomy      as child  . Joint replacement  2005    Right Hip  . Esophagogastroduodenoscopy (egd) with propofol  02/17/2012    Procedure: ESOPHAGOGASTRODUODENOSCOPY (EGD) WITH PROPOFOL;  Surgeon: Willis Modena, MD;  Location: [facility];  Service: Endoscopy;  Laterality: N/A;  . Colonoscopy with propofol  02/17/2012    Procedure: COLONOSCOPY WITH PROPOFOL;  Surgeon: Willis Modena, MD;  Location: [facility];  Service: Endoscopy;  Laterality: N/A;   Social History:  reports that he has never smoked. He has never used smokeless tobacco. He reports that he does not drink alcohol or use illicit drugs.   History reviewed. No pertinent family history.   No Known Allergies    Prior to Admission medications   Medication Sig Start Date End Date Taking? Authorizing Provider  ALPRAZolam Prudy Feeler) 1 MG tablet Take 1 mg by mouth at bedtime as needed for anxiety. *may repeat in 3-4 hrs if needed*   Yes Historical Provider, MD  atorvastatin (LIPITOR) 40 MG tablet Take 40 mg by mouth daily.   Yes Historical Provider, MD  Fe Fum-FA-B Cmp-C-Zn-Mg-Mn-Cu (HEMOCYTE-PLUS PO) Take 1 capsule by mouth daily.   Yes Historical Provider, MD  HYDROcodone-acetaminophen [facility]) [phone]  MG per tablet Take 1 tablet by mouth every 6 (six) hours as needed. For pain   Yes Historical Provider, MD  loratadine (CLARITIN) 10 MG tablet Take 10 mg by mouth  daily as needed for allergies.    Yes Historical Provider, MD  metoprolol (LOPRESSOR) 50 MG tablet Take 50 mg by mouth 2 (two) times daily.   Yes Historical Provider, MD  omeprazole (PRILOSEC) 40 MG capsule Take 40 mg by mouth daily.   Yes Historical Provider, MD    Review of Systems:  Constitutional:  No weight loss, night sweats, Fevers, chills, fatigue.  Head&Eyes: No headache.  No vision loss.  No eye pain or scotoma ENT:  No Difficulty swallowing,Tooth/dental problems,Sore throat,   Cardio-vascular:  No chest pain, Orthopnea, PND, swelling in lower extremities palpitations  GI:  No   diarrhea, loss of appetite, hematochezia, melena, heartburn, indigestion, Resp:  No shortness of breath with exertion or at rest. No cough. No coughing up of blood .No wheezing.No chest wall deformity  Skin:  no rash or lesions.  GU:  no dysuria, change in color of urine, no urgency or frequency. No flank pain.  Musculoskeletal:  No joint pain or swelling. No decreased range of motion. No back pain.  Psych:  No change in mood or affect. No depression or anxiety. Neurologic: No headache, no dysesthesia, no focal weakness, no vision loss. No syncope  Physical Exam: Filed Vitals:   09/19/13 1500 09/19/13 1510 09/19/13 1520 09/19/13 1530  BP: 117/69 113/65 122/76 135/100  Pulse: 117 103 115 116  Temp:      TempSrc:      Resp: 20 31 18 26   Height:      Weight:      SpO2: 100% 100% 100% 100%   General:  A&O x 3, NAD, nontoxic, pleasant/cooperative Head/Eye: No conjunctival hemorrhage, no icterus, [place]/AT, No nystagmus ENT:  No icterus,  No thrush, good dentition, no pharyngeal exudate Neck:  No masses, no lymphadenpathy, no bruits CV:  RRR, no rub, no gallop, no S3 Lung:  CTAB, good air movement, no wheeze, no rhonchi Abdomen: soft/NT, +BS, nondistended, no peritoneal signs Ext: No cyanosis, No rashes, No petechiae, No lymphangitis, 1+LE edema   Labs on Admission:  Basic Metabolic  Panel:  Recent Labs Lab 09/19/13 1155 09/19/13 1201  NA 138 137  K 4.9 4.8  CL 101 107  CO2 20  --   GLUCOSE 137* 133*  BUN 45* 44*  CREATININE 0.72 0.90  CALCIUM 8.9  --    Liver Function Tests:  Recent Labs Lab 09/19/13 1155  AST 13  ALT 18  ALKPHOS 92  BILITOT 0.4  PROT 7.1  ALBUMIN 3.7   No results found for this basename: LIPASE, AMYLASE,  in the last 168 hours No results found for this basename: AMMONIA,  in the last 168 hours CBC:  Recent Labs Lab 09/19/13 1155 09/19/13 1201  WBC 10.1  --   HGB 10.2* 11.9*  HCT 32.7* 35.0*  MCV 81.8  --   PLT 460*  --    Cardiac Enzymes: No results found for this basename: CKTOTAL, CKMB, CKMBINDEX, TROPONINI,  in the last 168 hours BNP: No components found with this basename: POCBNP,  CBG: No results found for this basename: GLUCAP,  in the last 168 hours  Radiological Exams on Admission: No results found.  EKG: Independently reviewed. Sinus tachycardia, no ST changes     Time spent:60minutes Code Status:   FULL Family Communication: No  Family at bedside   Amor Hyle, DO  [facility] Pager [phone]  If 7PM-7AM, please contact night-coverage [email or web address] Password [facility] 09/19/2013, 3:38 PM

## 2013-09-19 NOTE — ED Notes (Signed)
Per EMS: woke up about 7 this am wasn't feeling too good and started vomiting. 3 episodes of vomiting and 2 episodes of diarreha, both are black.  Pain is center of abdomen, denies history of same, denies trauma, belly is not distended nor tender.    VS: 130/80, HR 110 Spo2: 98% RA, RR 20.  CBG: 141

## 2013-09-20 ENCOUNTER — Encounter (HOSPITAL_COMMUNITY): Payer: Self-pay | Admitting: Gastroenterology

## 2013-09-20 DIAGNOSIS — D62 Acute posthemorrhagic anemia: Secondary | ICD-10-CM | POA: Diagnosis not present

## 2013-09-20 DIAGNOSIS — K219 Gastro-esophageal reflux disease without esophagitis: Secondary | ICD-10-CM

## 2013-09-20 DIAGNOSIS — K921 Melena: Secondary | ICD-10-CM | POA: Diagnosis not present

## 2013-09-20 DIAGNOSIS — K922 Gastrointestinal hemorrhage, unspecified: Secondary | ICD-10-CM | POA: Diagnosis not present

## 2013-09-20 DIAGNOSIS — D509 Iron deficiency anemia, unspecified: Secondary | ICD-10-CM | POA: Diagnosis not present

## 2013-09-20 DIAGNOSIS — K2960 Other gastritis without bleeding: Secondary | ICD-10-CM | POA: Diagnosis not present

## 2013-09-20 DIAGNOSIS — K920 Hematemesis: Secondary | ICD-10-CM | POA: Diagnosis not present

## 2013-09-20 DIAGNOSIS — I10 Essential (primary) hypertension: Secondary | ICD-10-CM | POA: Diagnosis not present

## 2013-09-20 LAB — HEMOGLOBIN AND HEMATOCRIT, BLOOD
HCT: 23 % — ABNORMAL LOW (ref 39.0–52.0)
HCT: 22.9 % — ABNORMAL LOW (ref 39.0–52.0)
HCT: 25.4 % — ABNORMAL LOW (ref 39.0–52.0)
Hemoglobin: 7.4 g/dL — ABNORMAL LOW (ref 13.0–17.0)
Hemoglobin: 7.2 g/dL — ABNORMAL LOW (ref 13.0–17.0)
Hemoglobin: 8.3 g/dL — ABNORMAL LOW (ref 13.0–17.0)

## 2013-09-20 LAB — IRON AND TIBC
Iron: 48 ug/dL (ref 42–135)
Saturation Ratios: 15 % — ABNORMAL LOW (ref 20–55)
TIBC: 323 ug/dL (ref 215–435)
UIBC: 275 ug/dL (ref 125–400)

## 2013-09-20 LAB — PREPARE RBC (CROSSMATCH)

## 2013-09-20 LAB — FERRITIN: Ferritin: 9 ng/mL — ABNORMAL LOW (ref 22–322)

## 2013-09-20 MED ORDER — METOPROLOL TARTRATE 25 MG PO TABS
25.0000 mg | ORAL_TABLET | Freq: Two times a day (BID) | ORAL | Status: DC
  Administered 2013-09-20 – 2013-09-21 (×3): 25 mg via ORAL
  Filled 2013-09-20 (×4): qty 1

## 2013-09-20 MED ORDER — FUROSEMIDE 10 MG/ML IJ SOLN
20.0000 mg | Freq: Once | INTRAMUSCULAR | Status: AC
  Administered 2013-09-20: 20 mg via INTRAVENOUS
  Filled 2013-09-20: qty 2

## 2013-09-20 MED ORDER — ACETAMINOPHEN 325 MG PO TABS
650.0000 mg | ORAL_TABLET | Freq: Once | ORAL | Status: AC
  Administered 2013-09-20: 650 mg via ORAL
  Filled 2013-09-20: qty 2

## 2013-09-20 MED ORDER — ALPRAZOLAM 0.5 MG PO TABS
1.0000 mg | ORAL_TABLET | Freq: Every evening | ORAL | Status: DC | PRN
  Administered 2013-09-20: 1 mg via ORAL
  Filled 2013-09-20: qty 2

## 2013-09-20 MED ORDER — DIPHENHYDRAMINE HCL 50 MG/ML IJ SOLN
25.0000 mg | Freq: Once | INTRAMUSCULAR | Status: AC
  Administered 2013-09-20: 25 mg via INTRAVENOUS
  Filled 2013-09-20: qty 1

## 2013-09-20 NOTE — Progress Notes (Signed)
Subjective: No further hematemesis or melena since admission yesterday afternoon. Tolerating diet. No abdominal pain.  Objective: Vital signs in last 24 hours: Temp:  [97.6 F (36.4 C)-98.8 F (37.1 C)] 97.6 F (36.4 C) (07/15 0427) Pulse Rate:  [88-149] 88 (07/15 0427) Resp:  [15-39] 16 (07/15 0427) BP: (106-144)/(65-100) 106/69 mmHg (07/15 0427) SpO2:  [96 %-100 %] 96 % (07/15 0427) Weight:  [117.935 kg (260 lb)-118.48 kg (261 lb 3.2 oz)] 118.48 kg (261 lb 3.2 oz) (07/14 1620) Weight change:  Last BM Date: 09/19/13  PE: GEN:  Pale-appearing, NAD ABD:  Soft  Lab Results: CBC    Component Value Date/Time   WBC 10.1 09/19/2013 1155   RBC 4.00* 09/19/2013 1155   HGB 7.4* 09/20/2013 0421   HCT 23.0* 09/20/2013 0421   PLT 460* 09/19/2013 1155   MCV 81.8 09/19/2013 1155   MCH 25.5* 09/19/2013 1155   MCHC 31.2 09/19/2013 1155   RDW 16.5* 09/19/2013 1155   LYMPHSABS 1.4 12/17/2010 2026   MONOABS 0.7 12/17/2010 2026   EOSABS 0.1 12/17/2010 2026   BASOSABS 0.0 12/17/2010 2026   Assessment:  1.  Melena and hematemesis, clinically resolved.  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  2.  Anemia, worsened since admission.  Suspect reequilibration +/- dilutional from hydration.  No further bleeding since admission yesterday afternoon.  Plan:  1.  Full liquid diet today, do not advance. 2.  Intravenous PPI today, possibly advance tomorrow. 3.  Follow CBC, consider transfusion if Hgb drops more. 4.  If Hgb continues to drop tomorrow, and/or patient develops overt bleeding again, would need to consider repeat endoscopy. 5.  If Hgb stabilizes and if patient has no further bleeding, consider possible discharge home tomorrow with close GI follow-up. 6.  Avoid NSAIDs. 7.  Will follow.   Freddy JakschOUTLAW,Clerance Umland M 09/20/2013, 9:38 AM

## 2013-09-20 NOTE — Progress Notes (Signed)
Utilization review completed.  

## 2013-09-20 NOTE — Progress Notes (Addendum)
Notified provider on call that pt's blood administration transfusing with normal saline, half of blood bag still full. Blood started at 5pm.  Blood now expired. Blood transfusion stopped.

## 2013-09-20 NOTE — Progress Notes (Signed)
Patient ID: Todd HightJohn D Hiltz, male   DOB: 12/11/1970, 43 y.o.   MRN: [id number]         PATIENT DETAILS Name: Todd [name] Age: 43 y.o. Sex: male Date of Birth: 01/04/1971 Admit Date: 09/19/2013 Admitting Physician Willis ModenaWilliam Outlaw, MD OZD:GUYQI,HKVQQVZPCP:WHITE,CYNTHIA S, MD   Brief History: 43 year old male with a history of iron deficiency anemia, GERD, paraesophageal hernia presented  with one-day history of hematemesis, abdominal pain, and melena.   Subjective: No new complaints today. EGD performed yesterday, no re-bleeding at this time. Continues to feel somewhat tired. Has not had a bowel movement today. Urinates frequently without difficulty.   Denies dizziness, weakness, shortness of breath, chest pain, nausea and abdominal pain.  Assessment/Plan:  Acute Upper GI bleed/Melena  -09/19/13 endoscopy showed linear erosions in the proximal lesser curvature with some mild oozing  -Continue PPI twice daily  -Serial Hgbs  -GI has advanced diet to full liquids  Acute blood loss anemia  -Presented with Hgb 10.2, baseline Hgb 12-13, trending down, most recent this morning at 7.4 -will transfuse 2 units PRBC today -If re-bleeding occurs, repeat endoscopy  Iron Deficiency Anemia -Ferritin low. Continue to monitor Iron, Ferritin, and TIBC  GERD without esophagitis -PPI  Unspecified Essential Hypertension  -The patient's blood pressure is soft postoperatively in part due to the patient's anesthesia and conscious sedation as well as opioids  -Continue to give half of his home dose of metoprolol tartrate and monitor closely   Disposition: Remain inpatient, observe to ensure no re-bleeding occurs and to observe Hgb level   DVT Prophylaxis: SCD's  Code Status: Full code   Family Communication No family present at this time.   Procedures: None at this time   CONSULTS:  GI consulted  Time spent 40 minutes-which includes 50% of the time with face-to-face with patient/ family and coordinating  care related to the above assessment and plan.  MEDICATIONS: Scheduled Meds: . acetaminophen  650 mg Oral Once  . diphenhydrAMINE  25 mg Intravenous Once  . furosemide  20 mg Intravenous Once  . metoprolol  25 mg Oral BID  . pantoprazole (PROTONIX) IV  40 mg Intravenous Q12H  . sodium chloride  3 mL Intravenous Q12H   Continuous Infusions:  PRN Meds:.acetaminophen, acetaminophen, HYDROcodone-acetaminophen, ondansetron (ZOFRAN) IV, ondansetron  Antibiotics: Anti-infectives   None       PHYSICAL EXAM: Vital signs in last 24 hours: Filed Vitals:   09/19/13 1600 09/19/13 1620 09/19/13 2010 09/20/13 0427  BP: 121/94 119/87 115/66 106/69  Pulse: 108 98 109 88  Temp:  98.4 F (36.9 C) 98.5 F (36.9 C) 97.6 F (36.4 C)  TempSrc:  Oral Oral Oral  Resp: 16 18 16 16   Height:  6\' 2"  (1.88 m)    Weight:  118.48 kg (261 lb 3.2 oz)    SpO2: 100% 96% 96% 96%    Weight change:  Filed Weights   09/19/13 1221 09/19/13 1620  Weight: 117.935 kg (260 lb) 118.48 kg (261 lb 3.2 oz)   Body mass index is 33.52 kg/(m^2).   Gen Exam: Awake and alert with clear speech, comfortably lying on bed.  Neck: Supple, No JVD.   ENT: conjunctiva slightly pale, good dentition, no gross blood present in posterior oropharynx.  Chest: B/L Clear, no wheezes or rales; capillary refill <3 seconds CVS: S1 S2 Regular, no murmurs. Abdomen: soft, BS +, non tender, non distended.   Extremities: no edema, lower extremities warm to touch.  Neurologic: Non Focal.  Skin:  No Rash.    Wounds: N/A.    Intake/Output from previous day:  Intake/Output Summary (Last 24 hours) at 09/20/13 0959 Last data filed at 09/20/13 0600  Gross per 24 hour  Intake    940 ml  Output    600 ml  Net    340 ml     LAB RESULTS: CBC  Recent Labs Lab 09/19/13 1155 09/19/13 1201 09/19/13 1700 09/19/13 2207 09/20/13 0421  WBC 10.1  --   --   --   --   HGB 10.2* 11.9* 8.4* 7.8* 7.4*  HCT 32.7* 35.0* 27.1* 24.8* 23.0*    PLT 460*  --   --   --   --   MCV 81.8  --   --   --   --   MCH 25.5*  --   --   --   --   MCHC 31.2  --   --   --   --   RDW 16.5*  --   --   --   --     Chemistries   Recent Labs Lab 09/19/13 1155 09/19/13 1201  NA 138 137  K 4.9 4.8  CL 101 107  CO2 20  --   GLUCOSE 137* 133*  BUN 45* 44*  CREATININE 0.72 0.90  CALCIUM 8.9  --     CBG: No results found for this basename: GLUCAP,  in the last 168 hours  GFR Estimated Creatinine Clearance: 146.2 ml/min (by C-G formula based on Cr of 0.9).  Coagulation profile  Recent Labs Lab 09/19/13 1155  INR 0.95    Cardiac Enzymes No results found for this basename: CK, CKMB, TROPONINI, MYOGLOBIN,  in the last 168 hours  No components found with this basename: POCBNP,  No results found for this basename: DDIMER,  in the last 72 hours No results found for this basename: HGBA1C,  in the last 72 hours No results found for this basename: CHOL, HDL, LDLCALC, TRIG, CHOLHDL, LDLDIRECT,  in the last 72 hours No results found for this basename: TSH, T4TOTAL, FREET3, T3FREE, THYROIDAB,  in the last 72 hours  Recent Labs  09/19/13 1700  FERRITIN 9*  TIBC 323  IRON 48   No results found for this basename: LIPASE, AMYLASE,  in the last 72 hours  Urine Studies No results found for this basename: UACOL, UAPR, USPG, UPH, UTP, UGL, UKET, UBIL, UHGB, UNIT, UROB, ULEU, UEPI, UWBC, URBC, UBAC, CAST, CRYS, UCOM, BILUA,  in the last 72 hours  MICROBIOLOGY: No results found for this or any previous visit (from the past 240 hour(s)).  RADIOLOGY STUDIES/RESULTS: No results found.  Corky Crafts, PA-S [facility] Pager:[phone]  If 7PM-7AM, please contact night-coverage [email or web address] Password [email or web address] 09/20/2013, 9:59 AM   LOS: 1 day   **Disclaimer: This note may have been dictated with voice recognition software. Similar sounding words can inadvertently be transcribed and this note may contain transcription errors  which may not have been corrected upon publication of note.**  Attending Patient was seen, examined,treatment plan was discussed with the Physician extender. I have directly reviewed the clinical findings, lab, imaging studies and management of this patient in detail. I have made the necessary changes to the above noted documentation, and agree with the documentation, as recorded by the Physician extender.  Todd Norfolk MD [facility].

## 2013-09-21 DIAGNOSIS — K2960 Other gastritis without bleeding: Secondary | ICD-10-CM | POA: Diagnosis not present

## 2013-09-21 DIAGNOSIS — I10 Essential (primary) hypertension: Secondary | ICD-10-CM | POA: Diagnosis not present

## 2013-09-21 DIAGNOSIS — K920 Hematemesis: Secondary | ICD-10-CM | POA: Diagnosis not present

## 2013-09-21 DIAGNOSIS — D509 Iron deficiency anemia, unspecified: Secondary | ICD-10-CM | POA: Diagnosis not present

## 2013-09-21 DIAGNOSIS — D62 Acute posthemorrhagic anemia: Secondary | ICD-10-CM | POA: Diagnosis not present

## 2013-09-21 DIAGNOSIS — K921 Melena: Secondary | ICD-10-CM | POA: Diagnosis not present

## 2013-09-21 DIAGNOSIS — K219 Gastro-esophageal reflux disease without esophagitis: Secondary | ICD-10-CM | POA: Diagnosis not present

## 2013-09-21 DIAGNOSIS — K922 Gastrointestinal hemorrhage, unspecified: Secondary | ICD-10-CM | POA: Diagnosis not present

## 2013-09-21 LAB — BASIC METABOLIC PANEL
Anion gap: 11 (ref 5–15)
BUN: 17 mg/dL (ref 6–23)
CO2: 29 mEq/L (ref 19–32)
Calcium: 8.4 mg/dL (ref 8.4–10.5)
Chloride: 103 mEq/L (ref 96–112)
Creatinine, Ser: 0.93 mg/dL (ref 0.50–1.35)
GFR calc Af Amer: >90 mL/min (ref >90)
GFR calc non Af Amer: >90 mL/min (ref >90)
Glucose, Bld: 97 mg/dL (ref 70–99)
Potassium: 4.3 mEq/L (ref 3.7–5.3)
Sodium: 143 mEq/L (ref 137–147)

## 2013-09-21 LAB — TYPE AND SCREEN
ABO/RH(D): A POS
Antibody Screen: NEGATIVE
Unit division: 0
Unit division: 0

## 2013-09-21 LAB — CBC
HCT: 26.4 % — ABNORMAL LOW (ref 39.0–52.0)
Hemoglobin: 8.4 g/dL — ABNORMAL LOW (ref 13.0–17.0)
MCH: 26.3 pg (ref 26.0–34.0)
MCHC: 31.8 g/dL (ref 30.0–36.0)
MCV: 82.5 fL (ref 78.0–100.0)
Platelets: 250 10*3/uL (ref 150–400)
RBC: 3.2 MIL/uL — ABNORMAL LOW (ref 4.22–5.81)
RDW: 16.2 % — ABNORMAL HIGH (ref 11.5–15.5)
WBC: 4.5 10*3/uL (ref 4.0–10.5)

## 2013-09-21 MED ORDER — PANTOPRAZOLE SODIUM 40 MG PO TBEC: 40.0000 mg | DELAYED_RELEASE_TABLET | Freq: Two times a day (BID) | ORAL | Status: DC

## 2013-09-21 MED ORDER — HYDROCODONE-ACETAMINOPHEN 10-325 MG PO TABS
1.0000 | ORAL_TABLET | ORAL | Status: DC | PRN
  Administered 2013-09-21: 1 via ORAL
  Filled 2013-09-21: qty 1

## 2013-09-21 NOTE — Discharge Summary (Signed)
PATIENT DETAILS Name: Todd [name] Age: 43 y.o. Sex: male Date of Birth: 1970-07-21 MRN: [id number]. Admit Date: 09/19/2013 Admitting Physician: Willis Modena, MD WJX:BJYNW,GNFAOZH S, MD  Recommendations for Outpatient Follow-up:  1. Please repeat CBC in 2 weeks  PRIMARY DISCHARGE DIAGNOSIS:  Active Problems:   GASTROESOPHAGEAL REFLUX, NO ESOPHAGITIS   Acute GI bleeding   Acute blood loss anemia   Unspecified essential hypertension   Sinus tachycardia      PAST MEDICAL HISTORY: Past Medical History  Diagnosis Date  . Hypertension   . GERD (gastroesophageal reflux disease)   . H/O hiatal hernia     DISCHARGE MEDICATIONS:   Medication List    STOP taking these medications       omeprazole 40 MG capsule  Commonly known as:  PRILOSEC      TAKE these medications       ALPRAZolam 1 MG tablet  Commonly known as:  XANAX  Take 1 mg by mouth at bedtime as needed for anxiety. *may repeat in 3-4 hrs if needed*     atorvastatin 40 MG tablet  Commonly known as:  LIPITOR  Take 40 mg by mouth daily.     HEMOCYTE-PLUS PO  Take 1 capsule by mouth daily.     HYDROcodone-acetaminophen 10-325 MG per tablet  Commonly known as:  NORCO  Take 1 tablet by mouth every 6 (six) hours as needed. For pain     loratadine 10 MG tablet  Commonly known as:  CLARITIN  Take 10 mg by mouth daily as needed for allergies.     metoprolol 50 MG tablet  Commonly known as:  LOPRESSOR  Take 50 mg by mouth 2 (two) times daily.     pantoprazole 40 MG tablet  Commonly known as:  PROTONIX  Take 1 tablet (40 mg total) by mouth 2 (two) times daily.        ALLERGIES:   Allergies  Allergen Reactions  . Ambien [Zolpidem Tartrate]     sedation  . Clonidine Derivatives     Caused Trudie Buckler syndrome  . Nu-Iron [Polysaccharide Iron Complex] Diarrhea    BRIEF HPI:  See H&P, Labs, Consult and Test reports for all details in brief, patient 43 year old male with a history of iron  deficiency anemia, GERD, paraesophageal hernia presents with one-day history of hematemesis, abdominal pain, or melena.  CONSULTATIONS:   GI  PERTINENT RADIOLOGIC STUDIES: No results found.   PERTINENT LAB RESULTS: CBC:  Recent Labs  09/19/13 1155  09/20/13 2310 09/21/13 0657  WBC 10.1  --   --  4.5  HGB 10.2*  < > 8.3* 8.4*  HCT 32.7*  < > 25.4* 26.4*  PLT 460*  --   --  250  < > = values in this interval not displayed. CMET CMP     Component Value Date/Time   NA 143 09/21/2013 0657   K 4.3 09/21/2013 0657   CL 103 09/21/2013 0657   CO2 29 09/21/2013 0657   GLUCOSE 97 09/21/2013 0657   BUN 17 09/21/2013 0657   CREATININE 0.93 09/21/2013 0657   CALCIUM 8.4 09/21/2013 0657   PROT 7.1 09/19/2013 1155   ALBUMIN 3.7 09/19/2013 1155   AST 13 09/19/2013 1155   ALT 18 09/19/2013 1155   ALKPHOS 92 09/19/2013 1155   BILITOT 0.4 09/19/2013 1155   GFRNONAA >90 09/21/2013 0657   GFRAA >90 09/21/2013 0657    GFR Estimated Creatinine Clearance: 141.5 ml/min (by C-G formula based on Cr  of 0.93). No results found for this basename: LIPASE, AMYLASE,  in the last 72 hours No results found for this basename: CKTOTAL, CKMB, CKMBINDEX, TROPONINI,  in the last 72 hours No components found with this basename: POCBNP,  No results found for this basename: DDIMER,  in the last 72 hours No results found for this basename: HGBA1C,  in the last 72 hours No results found for this basename: CHOL, HDL, LDLCALC, TRIG, CHOLHDL, LDLDIRECT,  in the last 72 hours No results found for this basename: TSH, T4TOTAL, FREET3, T3FREE, THYROIDAB,  in the last 72 hours  Recent Labs  09/19/13 1700  FERRITIN 9*  TIBC 323  IRON 48   Coags:  Recent Labs  09/19/13 1155  INR 0.95   Microbiology: No results found for this or any previous visit (from the past 240 hour(s)).   BRIEF HOSPITAL COURSE:  Acute Upper GI bleed/Melena  -09/19/13 endoscopy showed linear erosions in the proximal lesser curvature with some  mild oozing . No melena since EGD. Diet advanced, tolerated regular diet at discharged. Placed on IV PPI,per GI to continue PPI twice daily  -GI-Dr Dulce Sellarutlaw will follow patient in the office in 2 weeks.  Acute blood loss anemia  -Presented with Hgb 10.2, baseline Hgb 12-13, trended down to 7.2  -Received transfusion 7/15 and is now at 8.4, trending upward but not at baseline   Iron Deficiency Anemia  -Continue with FeSO4   GERD without esophagitis  -Continue PPI twice daily   Unspecified Essential Hypertension  -resume anti-hypertensive's on discharge  TODAY-DAY OF DISCHARGE:  Subjective:   Todd [name] today has no headache,no chest abdominal pain,no new weakness tingling or numbness, feels much better wants to go home today.  Objective:   Blood pressure 113/64, pulse 80, temperature 97.7 F (36.5 C), temperature source Oral, resp. rate 18, height 6\' 2"  (1.88 m), weight 118.48 kg (261 lb 3.2 oz), SpO2 93.00%.  Intake/Output Summary (Last 24 hours) at 09/21/13 1251 Last data filed at 09/20/13 2055  Gross per 24 hour  Intake   1357 ml  Output    800 ml  Net    557 ml   Filed Weights   09/19/13 1221 09/19/13 1620  Weight: 117.935 kg (260 lb) 118.48 kg (261 lb 3.2 oz)    Exam Awake Alert, Oriented *3, No new F.N deficits, Normal affect [place].AT,PERRAL Supple Neck,No JVD, No cervical lymphadenopathy appriciated.  Symmetrical Chest wall movement, Good air movement bilaterally, CTAB RRR,No Gallops,Rubs or new Murmurs, No Parasternal Heave +ve B.Sounds, Abd Soft, Non tender, No organomegaly appriciated, No rebound -guarding or rigidity. No Cyanosis, Clubbing or edema, No new Rash or bruise  DISCHARGE CONDITION: Stable  DISPOSITION: Home  DISCHARGE INSTRUCTIONS:    Activity:  As tolerated   Diet recommendation: Heart Healthy diet      Discharge Instructions   Call MD for:  extreme fatigue    Complete by:  As directed      Diet - low sodium heart healthy    Complete  by:  As directed      Increase activity slowly    Complete by:  As directed            Follow-up Information   Follow up with Cala BradfordWHITE,CYNTHIA S, MD. Schedule an appointment as soon as possible for a visit in 1 week.   Specialty:  Family Medicine   Contact information:   [address], [address] [place] [address] [phone]  Follow up with Freddy Jaksch, MD. Schedule an appointment as soon as possible for a visit in 2 weeks.   Specialty:  Gastroenterology   Contact information:   [address] [place] [address] [phone]      Total Time spent on discharge equals 45 minutes.  SignedJeoffrey Massed 09/21/2013 12:51 PM  **Disclaimer: This note may have been dictated with voice recognition software. Similar sounding words can inadvertently be transcribed and this note may contain transcription errors which may not have been corrected upon publication of note.**

## 2013-09-21 NOTE — Progress Notes (Signed)
Patient ID: Todd HightJohn D Bechtol, male   DOB: 12/18/1970, 43 y.o.   MRN: [id number]         PATIENT DETAILS Name: Todd [name] Age: 43 y.o. Sex: male Date of Birth: 01/19/1971 Admit Date: 09/19/2013 Admitting Physician Willis ModenaWilliam Outlaw, MD WUJ:WJXBJ,YNWGNFAPCP:WHITE,CYNTHIA S, MD  Brief History:  43 year old male with a history of iron deficiency anemia, GERD, paraesophageal hernia was admitted for hematemesis, abdominal pain, melena and anemia.   Subjective:  No new complaints today. No re-bleeding has occurred since EGD. Currently on a full liquid diet, has not had a bowel movement today. Patient complains of pain and is requesting his usual outpatient dosage of Norco q 4 hours prn instead of his current inpatient regimen of vicodin q 6 hours.   Assessment/Plan:  Acute Upper GI bleed/Melena  -09/19/13 endoscopy showed linear erosions in the proximal lesser curvature with some mild oozing . No melena since EGD. -Continue PPI twice daily  -Serial Hgbs  -Advanced to full diet-tolerating-therefore will advance to soft  Acute blood loss anemia  -Presented with Hgb 10.2, baseline Hgb 12-13, trended down to 7.2 -Received transfusion yesterday and is now at 8.4, trending upward but not at baseline -BUN has normalized which suggests bleeding has stopped.  -If re-bleeding occurs, repeat endoscopy   Iron Deficiency Anemia  -Ferritin low. Continue to monitor Iron, Ferritin, and TIBC   GERD without esophagitis  -Continue PPI twice daily  Unspecified Essential Hypertension  -The patient's blood pressure is soft postoperatively in part due to the patient's anesthesia and conscious sedation as well as opioids. However, blood pressure is rising, most recent reading this morning at 113/64. This is likely due to the transfusion he received yesterday. -Continue to give half of his home dose of metoprolol tartrate and monitor closely   Disposition: Discharge today if tolerates diet  DVT Prophylaxis: SCD's  Code  Status: Full code   Family Communication No family present at this time.  Procedures:  None  CONSULTS:  None  MEDICATIONS: Scheduled Meds: . metoprolol  25 mg Oral BID  . pantoprazole (PROTONIX) IV  40 mg Intravenous Q12H  . sodium chloride  3 mL Intravenous Q12H   Continuous Infusions:  PRN Meds:.acetaminophen, acetaminophen, ALPRAZolam, HYDROcodone-acetaminophen, ondansetron (ZOFRAN) IV, ondansetron  Antibiotics: Anti-infectives   None       PHYSICAL EXAM: Vital signs in last 24 hours: Filed Vitals:   09/20/13 1930 09/20/13 2030 09/20/13 2138 09/21/13 0513  BP: 110/57 96/64 98/63  113/64  Pulse: 93 83 88 80  Temp: 97.7 F (36.5 C) 98.3 F (36.8 C) 97.8 F (36.6 C) 97.7 F (36.5 C)  TempSrc: Oral Oral Oral Oral  Resp: 16 14 19 18   Height:      Weight:      SpO2: 98% 94% 96% 93%    Weight change:  Filed Weights   09/19/13 1221 09/19/13 1620  Weight: 117.935 kg (260 lb) 118.48 kg (261 lb 3.2 oz)   Body mass index is 33.52 kg/(m^2).   Gen Exam: Awake and alert with clear speech.   Neck: Supple, No JVD.   Chest: B/L Clear.   CVS: S1 S2 Regular, no murmurs.  Abdomen: soft, BS +, non tender, non distended.  Extremities: no edema, lower extremities warm to touch. Neurologic: Non Focal.   Skin: No Rash.   Wounds: N/A.    Intake/Output from previous day:  Intake/Output Summary (Last 24 hours) at 09/21/13 0907 Last data filed at 09/20/13 2055  Gross per 24 hour  Intake  1357 ml  Output    800 ml  Net    557 ml     LAB RESULTS: CBC  Recent Labs Lab 09/19/13 1155  09/19/13 2207 09/20/13 0421 09/20/13 1000 09/20/13 2310 09/21/13 0657  WBC 10.1  --   --   --   --   --  4.5  HGB 10.2*  < > 7.8* 7.4* 7.2* 8.3* 8.4*  HCT 32.7*  < > 24.8* 23.0* 22.9* 25.4* 26.4*  PLT 460*  --   --   --   --   --  250  MCV 81.8  --   --   --   --   --  82.5  MCH 25.5*  --   --   --   --   --  26.3  MCHC 31.2  --   --   --   --   --  31.8  RDW 16.5*  --   --    --   --   --  16.2*  < > = values in this interval not displayed.  Chemistries   Recent Labs Lab 09/19/13 1155 09/19/13 1201 09/21/13 0657  NA 138 137 143  K 4.9 4.8 4.3  CL 101 107 103  CO2 20  --  29  GLUCOSE 137* 133* 97  BUN 45* 44* 17  CREATININE 0.72 0.90 0.93  CALCIUM 8.9  --  8.4    CBG: No results found for this basename: GLUCAP,  in the last 168 hours  GFR Estimated Creatinine Clearance: 141.5 ml/min (by C-G formula based on Cr of 0.93).  Coagulation profile  Recent Labs Lab 09/19/13 1155  INR 0.95    Cardiac Enzymes No results found for this basename: CK, CKMB, TROPONINI, MYOGLOBIN,  in the last 168 hours  No components found with this basename: POCBNP,  No results found for this basename: DDIMER,  in the last 72 hours No results found for this basename: HGBA1C,  in the last 72 hours No results found for this basename: CHOL, HDL, LDLCALC, TRIG, CHOLHDL, LDLDIRECT,  in the last 72 hours No results found for this basename: TSH, T4TOTAL, FREET3, T3FREE, THYROIDAB,  in the last 72 hours  Recent Labs  09/19/13 1700  FERRITIN 9*  TIBC 323  IRON 48   No results found for this basename: LIPASE, AMYLASE,  in the last 72 hours  Urine Studies No results found for this basename: UACOL, UAPR, USPG, UPH, UTP, UGL, UKET, UBIL, UHGB, UNIT, UROB, ULEU, UEPI, UWBC, URBC, UBAC, CAST, CRYS, UCOM, BILUA,  in the last 72 hours  MICROBIOLOGY: No results found for this or any previous visit (from the past 240 hour(s)).  RADIOLOGY STUDIES/RESULTS: No results found.  Corky Crafts, PA-S  [facility] Pager:[phone]  If 7PM-7AM, please contact night-coverage [email or web address] Password [email or web address] 09/21/2013, 9:07 AM   LOS: 2 days   **Disclaimer: This note may have been dictated with voice recognition software. Similar sounding words can inadvertently be transcribed and this note may contain transcription errors which may not have been corrected upon  publication of note.**  Attending Patient was seen, examined,treatment plan was discussed with the Physician extender. I have directly reviewed the clinical findings, lab, imaging studies and management of this patient in detail. I have made the necessary changes to the above noted documentation, and agree with the documentation, as recorded by the Physician extender.  Windell Norfolk MD [facility].

## 2013-09-21 NOTE — Progress Notes (Signed)
09/21/13 Patient discharged home. He removed both IV sites.Discharge instructs reviewed.

## 2013-09-21 NOTE — Discharge Instructions (Signed)
Follow with Primary MD  Cala BradfordWHITE,CYNTHIA S, MD  and Dr Ula Lingoutlaw-GI MD as instructed your Hospitalist MD  Please get a complete blood count and chemistry panel checked by your Primary MD at your next visit, and again as instructed by your Primary MD.  Bonita QuinYou had Gastrointestinal Bleeding: Please ask your Primary MD to check a complete blood count within one week of discharge or at your next visit.Your endoscopic/colonoscopic biopsies that are pending at the time of discharge, will also need to followed by your Primary MD.  Get Medicines reviewed and adjusted. Please take all your medications with you for your next visit with your Primary MD  Please request your Primary MD to go over all hospital tests and procedure/radiological results at the follow up, please ask your Primary MD to get all Hospital records sent to his/her office.  If you experience worsening of your admission symptoms, develop shortness of breath, life threatening emergency, suicidal or homicidal thoughts you must seek medical attention immediately by calling 911 or calling your MD immediately  if symptoms less severe.  You must read complete instructions/literature along with all the possible adverse reactions/side effects for all the Medicines you take and that have been prescribed to you. Take any new Medicines after you have completely understood and accpet all the possible adverse reactions/side effects.   Do not drive when taking Pain medications.   Do not take more than prescribed Pain, Sleep and Anxiety Medications  Special Instructions: If you have smoked or chewed Tobacco  in the last 2 yrs please stop smoking, stop any regular Alcohol  and or any Recreational drug use.  Wear Seat belts while driving.  Please note  You were cared for by a hospitalist during your hospital stay. Once you are discharged, your primary care physician will handle any further medical issues. Please note that NO REFILLS for any discharge  medications will be authorized once you are discharged, as it is imperative that you return to your primary care physician (or establish a relationship with a primary care physician if you do not have one) for your aftercare needs so that they can reassess your need for medications and monitor your lab values.
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
## 2013-09-21 NOTE — Care Management Note (Signed)
    Page 1 of 1   09/21/2013     4:53:57 PM CARE MANAGEMENT NOTE 09/21/2013  Patient:  Todd [name],Todd [name]   Account Number:  [id number]  Date Initiated:  09/21/2013  Documentation initiated by:  Letha CapeAYLOR,Tianni Escamilla  Subjective/Objective Assessment:   dx gib  admit- lives with spouse.     Action/Plan:   Anticipated DC Date:  09/22/2013   Anticipated DC Plan:  HOME/SELF CARE      DC Planning Services  CM consult      Choice offered to / List presented to:             Status of service:  In process, will continue to follow Medicare Important Message given?   (If response is "NO", the following Medicare IM given date fields will be blank) Date Medicare IM given:   Medicare IM given by:   Date Additional Medicare IM given:   Additional Medicare IM given by:    Discharge Disposition:    Per UR Regulation:  Reviewed for med. necessity/level of care/duration of stay  If discussed at Long Length of Stay Meetings, dates discussed:    Comments:

## 2013-09-21 NOTE — Progress Notes (Signed)
Notified Schorr, NP of hgb 8.3. No new orders given.

## 2013-09-21 NOTE — Progress Notes (Signed)
Subjective: No hematemesis or melena since admission.  Objective: Vital signs in last 24 hours: Temp:  [97.7 F (36.5 C)-98.8 F (37.1 C)] 97.7 F (36.5 C) (07/16 0513) Pulse Rate:  [73-93] 80 (07/16 0513) Resp:  [14-19] 18 (07/16 0513) BP: (91-114)/(57-74) 113/64 mmHg (07/16 0513) SpO2:  [93 %-99 %] 93 % (07/16 0513) Weight change:  Last BM Date: 09/19/13  PE: GEN:  NAD, less pale-appearing  Lab Results: CBC    Component Value Date/Time   WBC 4.5 09/21/2013 0657   RBC 3.20* 09/21/2013 0657   HGB 8.4* 09/21/2013 0657   HCT 26.4* 09/21/2013 0657   PLT 250 09/21/2013 0657   MCV 82.5 09/21/2013 0657   MCH 26.3 09/21/2013 0657   MCHC 31.8 09/21/2013 0657   RDW 16.2* 09/21/2013 0657   LYMPHSABS 1.4 12/17/2010 2026   MONOABS 0.7 12/17/2010 2026   EOSABS 0.1 12/17/2010 2026   BASOSABS 0.0 12/17/2010 2026   CMP     Component Value Date/Time   NA 143 09/21/2013 0657   K 4.3 09/21/2013 0657   CL 103 09/21/2013 0657   CO2 29 09/21/2013 0657   GLUCOSE 97 09/21/2013 0657   BUN 17 09/21/2013 0657   CREATININE 0.93 09/21/2013 0657   CALCIUM 8.4 09/21/2013 0657   PROT 7.1 09/19/2013 1155   ALBUMIN 3.7 09/19/2013 1155   AST 13 09/19/2013 1155   ALT 18 09/19/2013 1155   ALKPHOS 92 09/19/2013 1155   BILITOT 0.4 09/19/2013 1155   GFRNONAA >90 09/21/2013 0657   GFRAA >90 09/21/2013 0657   Assessment:  1. Melena and hematemesis, clinically resolved. 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  2. Anemia, worsened since admission, improved after transfusion. Suspect reequilibration +/- dilutional from hydration. No further bleeding since admission.  Plan:  1.  Protonix 40 mg po bid. 2.  Repeat CBC as outpatient in one week. 3.  Office follow-up with me in 3-4 weeks. 4.  Ok for discharge home from GI perspective; will sign-off; please call with questions.   Freddy JakschOUTLAW,Chevi Lim M 09/21/2013, 12:50 PM

## 2013-09-21 NOTE — Progress Notes (Signed)
Spoke with [facility]ologist[facility], NP. Orders placed for hematocrit and hemoglobin labs, will notify NP of results.

## 2013-10-11 DIAGNOSIS — Z471 Aftercare following joint replacement surgery: Secondary | ICD-10-CM | POA: Diagnosis not present

## 2013-10-11 DIAGNOSIS — Z96649 Presence of unspecified artificial hip joint: Secondary | ICD-10-CM | POA: Diagnosis not present

## 2013-10-20 ENCOUNTER — Encounter (HOSPITAL_COMMUNITY): Payer: Medicare Other | Admitting: Anesthesiology

## 2013-10-20 ENCOUNTER — Emergency Department (HOSPITAL_COMMUNITY): Payer: Medicare Other

## 2013-10-20 ENCOUNTER — Inpatient Hospital Stay (HOSPITAL_COMMUNITY)
Admission: EM | Admit: 2013-10-20 | Discharge: 2013-10-21 | DRG: 559 | Disposition: A | Discharge status: Home / Self Care | Payer: Medicare Other | Attending: Internal Medicine | Admitting: Internal Medicine

## 2013-10-20 ENCOUNTER — Encounter (HOSPITAL_COMMUNITY)
Admission: EM | Disposition: A | Discharge status: Home / Self Care | Payer: Self-pay | Source: Home / Self Care | Attending: Internal Medicine

## 2013-10-20 ENCOUNTER — Inpatient Hospital Stay (HOSPITAL_COMMUNITY): Payer: Medicare Other

## 2013-10-20 ENCOUNTER — Encounter (HOSPITAL_COMMUNITY): Payer: Self-pay | Admitting: Emergency Medicine

## 2013-10-20 ENCOUNTER — Emergency Department (HOSPITAL_COMMUNITY): Payer: Medicare Other | Admitting: Anesthesiology

## 2013-10-20 DIAGNOSIS — K219 Gastro-esophageal reflux disease without esophagitis: Secondary | ICD-10-CM | POA: Diagnosis present

## 2013-10-20 DIAGNOSIS — Z96649 Presence of unspecified artificial hip joint: Secondary | ICD-10-CM | POA: Diagnosis not present

## 2013-10-20 DIAGNOSIS — I517 Cardiomegaly: Secondary | ICD-10-CM | POA: Diagnosis not present

## 2013-10-20 DIAGNOSIS — Z4789 Encounter for other orthopedic aftercare: Secondary | ICD-10-CM | POA: Diagnosis not present

## 2013-10-20 DIAGNOSIS — F411 Generalized anxiety disorder: Secondary | ICD-10-CM | POA: Diagnosis present

## 2013-10-20 DIAGNOSIS — M25559 Pain in unspecified hip: Secondary | ICD-10-CM | POA: Diagnosis not present

## 2013-10-20 DIAGNOSIS — K449 Diaphragmatic hernia without obstruction or gangrene: Secondary | ICD-10-CM

## 2013-10-20 DIAGNOSIS — E785 Hyperlipidemia, unspecified: Secondary | ICD-10-CM | POA: Diagnosis present

## 2013-10-20 DIAGNOSIS — S73006A Unspecified dislocation of unspecified hip, initial encounter: Secondary | ICD-10-CM | POA: Diagnosis present

## 2013-10-20 DIAGNOSIS — T84029D Dislocation of unspecified internal joint prosthesis, subsequent encounter: Secondary | ICD-10-CM

## 2013-10-20 DIAGNOSIS — M259 Joint disorder, unspecified: Secondary | ICD-10-CM | POA: Diagnosis not present

## 2013-10-20 DIAGNOSIS — Z5189 Encounter for other specified aftercare: Secondary | ICD-10-CM | POA: Diagnosis not present

## 2013-10-20 DIAGNOSIS — J9819 Other pulmonary collapse: Secondary | ICD-10-CM | POA: Diagnosis not present

## 2013-10-20 DIAGNOSIS — R4182 Altered mental status, unspecified: Secondary | ICD-10-CM | POA: Diagnosis not present

## 2013-10-20 DIAGNOSIS — J189 Pneumonia, unspecified organism: Secondary | ICD-10-CM | POA: Diagnosis present

## 2013-10-20 DIAGNOSIS — Z79899 Other long term (current) drug therapy: Secondary | ICD-10-CM

## 2013-10-20 DIAGNOSIS — R0602 Shortness of breath: Secondary | ICD-10-CM | POA: Diagnosis not present

## 2013-10-20 DIAGNOSIS — T84029A Dislocation of unspecified internal joint prosthesis, initial encounter: Secondary | ICD-10-CM | POA: Diagnosis present

## 2013-10-20 DIAGNOSIS — Z66 Do not resuscitate: Secondary | ICD-10-CM | POA: Diagnosis present

## 2013-10-20 DIAGNOSIS — K2100 Gastro-esophageal reflux disease with esophagitis, without bleeding: Secondary | ICD-10-CM | POA: Diagnosis not present

## 2013-10-20 DIAGNOSIS — I10 Essential (primary) hypertension: Secondary | ICD-10-CM

## 2013-10-20 DIAGNOSIS — Y831 Surgical operation with implant of artificial internal device as the cause of abnormal reaction of the patient, or of later complication, without mention of misadventure at the time of the procedure: Secondary | ICD-10-CM | POA: Diagnosis present

## 2013-10-20 DIAGNOSIS — T148XXA Other injury of unspecified body region, initial encounter: Secondary | ICD-10-CM | POA: Diagnosis not present

## 2013-10-20 HISTORY — PX: HIP CLOSED REDUCTION: SHX983

## 2013-10-20 LAB — PRO B NATRIURETIC PEPTIDE: Pro B Natriuretic peptide (BNP): 119.6 pg/mL (ref 0–125)

## 2013-10-20 LAB — CBC WITH DIFFERENTIAL/PLATELET
Basophils Absolute: 0 10*3/uL (ref 0.0–0.1)
Basophils Relative: 0 % (ref 0–1)
Eosinophils Absolute: 0.2 10*3/uL (ref 0.0–0.7)
Eosinophils Relative: 2 % (ref 0–5)
HCT: 30.4 % — ABNORMAL LOW (ref 39.0–52.0)
Hemoglobin: 9.3 g/dL — ABNORMAL LOW (ref 13.0–17.0)
Lymphocytes Relative: 7 % — ABNORMAL LOW (ref 12–46)
Lymphs Abs: 0.8 10*3/uL (ref 0.7–4.0)
MCH: 25.1 pg — ABNORMAL LOW (ref 26.0–34.0)
MCHC: 30.6 g/dL (ref 30.0–36.0)
MCV: 82.2 fL (ref 78.0–100.0)
Monocytes Absolute: 1 10*3/uL (ref 0.1–1.0)
Monocytes Relative: 9 % (ref 3–12)
Neutro Abs: 9.3 10*3/uL — ABNORMAL HIGH (ref 1.7–7.7)
Neutrophils Relative %: 82 % — ABNORMAL HIGH (ref 43–77)
Platelets: 316 10*3/uL (ref 150–400)
RBC: 3.7 MIL/uL — ABNORMAL LOW (ref 4.22–5.81)
RDW: 16.4 % — ABNORMAL HIGH (ref 11.5–15.5)
WBC: 11.3 10*3/uL — ABNORMAL HIGH (ref 4.0–10.5)

## 2013-10-20 LAB — URINALYSIS, ROUTINE W REFLEX MICROSCOPIC
Bilirubin Urine: NEGATIVE
Glucose, UA: NEGATIVE mg/dL
Hgb urine dipstick: NEGATIVE
Ketones, ur: NEGATIVE mg/dL
Leukocytes, UA: NEGATIVE
Nitrite: NEGATIVE
Protein, ur: NEGATIVE mg/dL
Specific Gravity, Urine: 1.025 (ref 1.005–1.030)
Urobilinogen, UA: 0.2 mg/dL (ref 0.0–1.0)
pH: 5.5 (ref 5.0–8.0)

## 2013-10-20 LAB — I-STAT TROPONIN, ED: Troponin i, poc: 0 ng/mL (ref 0.00–0.08)

## 2013-10-20 LAB — RAPID URINE DRUG SCREEN, HOSP PERFORMED
Amphetamines: NOT DETECTED
Barbiturates: NOT DETECTED
Benzodiazepines: POSITIVE — AB
Cocaine: NOT DETECTED
Opiates: POSITIVE — AB
Tetrahydrocannabinol: NOT DETECTED

## 2013-10-20 LAB — BASIC METABOLIC PANEL
Anion gap: 10 (ref 5–15)
BUN: 19 mg/dL (ref 6–23)
CO2: 28 mEq/L (ref 19–32)
Calcium: 8.7 mg/dL (ref 8.4–10.5)
Chloride: 103 mEq/L (ref 96–112)
Creatinine, Ser: 1.37 mg/dL — ABNORMAL HIGH (ref 0.50–1.35)
GFR calc Af Amer: 72 mL/min — ABNORMAL LOW (ref >90)
GFR calc non Af Amer: 62 mL/min — ABNORMAL LOW (ref >90)
Glucose, Bld: 126 mg/dL — ABNORMAL HIGH (ref 70–99)
Potassium: 3.8 mEq/L (ref 3.7–5.3)
Sodium: 141 mEq/L (ref 137–147)

## 2013-10-20 LAB — HEPATIC FUNCTION PANEL
ALT: 13 U/L (ref 0–53)
AST: 21 U/L (ref 0–37)
Albumin: 3.7 g/dL (ref 3.5–5.2)
Alkaline Phosphatase: 99 U/L (ref 39–117)
Bilirubin, Direct: <0.2 mg/dL (ref 0.0–0.3)
Total Bilirubin: 0.4 mg/dL (ref 0.3–1.2)
Total Protein: 6.8 g/dL (ref 6.0–8.3)

## 2013-10-20 LAB — ETHANOL: Alcohol, Ethyl (B): <11 mg/dL (ref 0–11)

## 2013-10-20 LAB — I-STAT CG4 LACTIC ACID, ED: Lactic Acid, Venous: 0.7 mmol/L (ref 0.5–2.2)

## 2013-10-20 LAB — STREP PNEUMONIAE URINARY ANTIGEN: Strep Pneumo Urinary Antigen: NEGATIVE

## 2013-10-20 LAB — AMMONIA: Ammonia: 29 umol/L (ref 11–60)

## 2013-10-20 SURGERY — CLOSED REDUCTION, HIP
Anesthesia: General | Site: Hip | Laterality: Right

## 2013-10-20 MED ORDER — PROPOFOL 10 MG/ML IV BOLUS
INTRAVENOUS | Status: DC | PRN
  Administered 2013-10-20: 150 mg via INTRAVENOUS

## 2013-10-20 MED ORDER — DEXTROSE 5 % IV SOLN
500.0000 mg | Freq: Once | INTRAVENOUS | Status: AC
  Administered 2013-10-20: 500 mg via INTRAVENOUS
  Filled 2013-10-20: qty 500

## 2013-10-20 MED ORDER — ATORVASTATIN CALCIUM 40 MG PO TABS
40.0000 mg | ORAL_TABLET | Freq: Every day | ORAL | Status: DC
  Administered 2013-10-20: 40 mg via ORAL
  Filled 2013-10-20 (×2): qty 1

## 2013-10-20 MED ORDER — ONDANSETRON HCL 4 MG/2ML IJ SOLN: 4.0000 mg | Freq: Four times a day (QID) | INTRAMUSCULAR | Status: DC | PRN

## 2013-10-20 MED ORDER — DEXTROSE 5 % IV SOLN
500.0000 mg | INTRAVENOUS | Status: DC
  Filled 2013-10-20: qty 500

## 2013-10-20 MED ORDER — SODIUM CHLORIDE 0.9 % IV SOLN
Freq: Once | INTRAVENOUS | Status: AC
  Administered 2013-10-20: 125 mL/h via INTRAVENOUS

## 2013-10-20 MED ORDER — DEXTROSE 5 % IV SOLN
1.0000 g | Freq: Once | INTRAVENOUS | Status: AC
  Administered 2013-10-20: 1 g via INTRAVENOUS
  Filled 2013-10-20: qty 10

## 2013-10-20 MED ORDER — IOHEXOL 300 MG/ML  SOLN
75.0000 mL | Freq: Once | INTRAMUSCULAR | Status: AC | PRN
  Administered 2013-10-20: 75 mL via INTRAVENOUS

## 2013-10-20 MED ORDER — PANTOPRAZOLE SODIUM 40 MG PO TBEC
40.0000 mg | DELAYED_RELEASE_TABLET | Freq: Two times a day (BID) | ORAL | Status: DC
Start: 2013-10-20 — End: 2013-10-21
  Administered 2013-10-20 – 2013-10-21 (×3): 40 mg via ORAL
  Filled 2013-10-20 (×3): qty 1

## 2013-10-20 MED ORDER — SODIUM CHLORIDE 0.9 % IV SOLN
INTRAVENOUS | Status: DC | PRN
  Administered 2013-10-20: 06:00:00 via INTRAVENOUS

## 2013-10-20 MED ORDER — HYDROMORPHONE HCL PF 1 MG/ML IJ SOLN: 0.2500 mg | INTRAMUSCULAR | Status: DC | PRN

## 2013-10-20 MED ORDER — SUCCINYLCHOLINE CHLORIDE 20 MG/ML IJ SOLN
INTRAMUSCULAR | Status: AC
  Filled 2013-10-20: qty 1

## 2013-10-20 MED ORDER — FENTANYL CITRATE 0.05 MG/ML IJ SOLN
INTRAMUSCULAR | Status: AC
  Filled 2013-10-20: qty 5

## 2013-10-20 MED ORDER — DEXTROSE 5 % IV SOLN
1.0000 g | INTRAVENOUS | Status: DC
  Administered 2013-10-21: 1 g via INTRAVENOUS
  Filled 2013-10-20: qty 10

## 2013-10-20 MED ORDER — SUCCINYLCHOLINE CHLORIDE 20 MG/ML IJ SOLN
INTRAMUSCULAR | Status: DC | PRN
  Administered 2013-10-20: 110 mg via INTRAVENOUS

## 2013-10-20 MED ORDER — PROPOFOL 10 MG/ML IV BOLUS
INTRAVENOUS | Status: AC
  Filled 2013-10-20: qty 20

## 2013-10-20 MED ORDER — SODIUM CHLORIDE 0.9 % IV SOLN
INTRAVENOUS | Status: DC
  Administered 2013-10-20: 1000 mL via INTRAVENOUS
  Administered 2013-10-20: 11:00:00 via INTRAVENOUS

## 2013-10-20 MED ORDER — LIDOCAINE HCL (CARDIAC) 20 MG/ML IV SOLN
INTRAVENOUS | Status: DC | PRN
  Administered 2013-10-20: 60 mg via INTRAVENOUS

## 2013-10-20 MED ORDER — HYDROCODONE-ACETAMINOPHEN 10-325 MG PO TABS
1.0000 | ORAL_TABLET | Freq: Four times a day (QID) | ORAL | Status: DC | PRN
  Administered 2013-10-20 – 2013-10-21 (×3): 1 via ORAL
  Filled 2013-10-20 (×3): qty 1

## 2013-10-20 MED ORDER — ACETAMINOPHEN 325 MG PO TABS: 650.0000 mg | ORAL_TABLET | Freq: Four times a day (QID) | ORAL | Status: DC | PRN

## 2013-10-20 MED ORDER — ALPRAZOLAM 0.5 MG PO TABS: 0.5000 mg | ORAL_TABLET | Freq: Every evening | ORAL | Status: DC | PRN

## 2013-10-20 MED ORDER — LIDOCAINE HCL (CARDIAC) 20 MG/ML IV SOLN
INTRAVENOUS | Status: AC
  Filled 2013-10-20: qty 5

## 2013-10-20 MED ORDER — IOHEXOL 300 MG/ML  SOLN
80.0000 mL | Freq: Once | INTRAMUSCULAR | Status: AC | PRN
  Administered 2013-10-20: 80 mL via INTRAVENOUS

## 2013-10-20 MED ORDER — ENOXAPARIN SODIUM 40 MG/0.4ML ~~LOC~~ SOLN
40.0000 mg | SUBCUTANEOUS | Status: DC
  Filled 2013-10-20 (×2): qty 0.4

## 2013-10-20 MED ORDER — DEXTROSE 5 % IV SOLN
1.0000 g | INTRAVENOUS | Status: DC | PRN
  Administered 2013-10-20: 50 g via INTRAVENOUS

## 2013-10-20 MED ORDER — METOPROLOL TARTRATE 25 MG PO TABS
25.0000 mg | ORAL_TABLET | Freq: Two times a day (BID) | ORAL | Status: DC
  Administered 2013-10-21: 25 mg via ORAL
  Filled 2013-10-20 (×4): qty 1

## 2013-10-20 MED ORDER — MIDAZOLAM HCL 2 MG/2ML IJ SOLN
INTRAMUSCULAR | Status: AC
  Filled 2013-10-20: qty 2

## 2013-10-20 SURGICAL SUPPLY — 2 items
IMMOBILIZER KNEE 20 (SOFTGOODS) ×2
IMMOBILIZER KNEE 20 THIGH 36 (SOFTGOODS) ×1 IMPLANT

## 2013-10-20 NOTE — Consult Note (Addendum)
Reason for Consult:right  THA dislocation Referring Physician: Dr. Sharol Given     ERMD  Todd MENDIZABAL is an 43 y.o. male.  HPI: 43 yo male confused with pulmonary edema and multiple Hx of how his hip may have popped out.   CT chest large hiatal hernia and pneumonia. Xray right hip - dislocation.   Wife per phone states he was in bed when she got home and when he tried to get up he could not . Not sure what he did before he crawled to bed to pop out hip.   Past Medical History  Diagnosis Date  . Hypertension   . GERD (gastroesophageal reflux disease)   . H/O hiatal hernia     Past Surgical History  Procedure Laterality Date  . Appendectomy      as child  . Joint replacement  2005    Right Hip  . Esophagogastroduodenoscopy (egd) with propofol  02/17/2012    Procedure: ESOPHAGOGASTRODUODENOSCOPY (EGD) WITH PROPOFOL;  Surgeon: Arta Silence, MD;  Location: [facility] ENDOSCOPY;  Service: Endoscopy;  Laterality: N/A;  . Colonoscopy with propofol  02/17/2012    Procedure: COLONOSCOPY WITH PROPOFOL;  Surgeon: Arta Silence, MD;  Location: [facility] ENDOSCOPY;  Service: Endoscopy;  Laterality: N/A;  . Esophagogastroduodenoscopy Left 09/19/2013    Procedure: ESOPHAGOGASTRODUODENOSCOPY (EGD);  Surgeon: Arta Silence, MD;  Location: [facility] ENDOSCOPY;  Service: Endoscopy;  Laterality: Left;    History reviewed. No pertinent family history.  Social History:  reports that he has never smoked. He has never used smokeless tobacco. He reports that he does not drink alcohol or use illicit drugs.  Allergies:  Allergies  Allergen Reactions  . Ambien [Zolpidem Tartrate]     sedation  . Clonidine Derivatives     Caused Katherina Right syndrome  . Nu-Iron [Polysaccharide Iron Complex] Diarrhea    Medications: I have reviewed the patient's current medications.  Results for orders placed during the hospital encounter of 10/20/13 (from the past 48 hour(s))  CBC WITH DIFFERENTIAL     Status: Abnormal   Collection Time     10/20/13  1:46 AM      Result Value Ref Range   WBC 11.3 (*) 4.0 - 10.5 K/uL   RBC 3.70 (*) 4.22 - 5.81 MIL/uL   Hemoglobin 9.3 (*) 13.0 - 17.0 g/dL   HCT 30.4 (*) 39.0 - 52.0 %   MCV 82.2  78.0 - 100.0 fL   MCH 25.1 (*) 26.0 - 34.0 pg   MCHC 30.6  30.0 - 36.0 g/dL   RDW 16.4 (*) 11.5 - 15.5 %   Platelets 316  150 - 400 K/uL   Neutrophils Relative % 82 (*) 43 - 77 %   Neutro Abs 9.3 (*) 1.7 - 7.7 K/uL   Lymphocytes Relative 7 (*) 12 - 46 %   Lymphs Abs 0.8  0.7 - 4.0 K/uL   Monocytes Relative 9  3 - 12 %   Monocytes Absolute 1.0  0.1 - 1.0 K/uL   Eosinophils Relative 2  0 - 5 %   Eosinophils Absolute 0.2  0.0 - 0.7 K/uL   Basophils Relative 0  0 - 1 %   Basophils Absolute 0.0  0.0 - 0.1 K/uL  BASIC METABOLIC PANEL     Status: Abnormal   Collection Time    10/20/13  1:46 AM      Result Value Ref Range   Sodium 141  137 - 147 mEq/L   Potassium 3.8  3.7 - 5.3 mEq/L  Chloride 103  96 - 112 mEq/L   CO2 28  19 - 32 mEq/L   Glucose, Bld 126 (*) 70 - 99 mg/dL   BUN 19  6 - 23 mg/dL   Creatinine, Ser 1.37 (*) 0.50 - 1.35 mg/dL   Calcium 8.7  8.4 - 10.5 mg/dL   GFR calc non Af Amer 62 (*) >90 mL/min   GFR calc Af Amer 72 (*) >90 mL/min   Comment: (NOTE)     The eGFR has been calculated using the CKD EPI equation.     This calculation has not been validated in all clinical situations.     eGFR's persistently <90 mL/min signify possible Chronic Kidney     Disease.   Anion gap 10  5 - 15  ETHANOL     Status: None   Collection Time    10/20/13  1:46 AM      Result Value Ref Range   Alcohol, Ethyl (B) <11  0 - 11 mg/dL   Comment:            LOWEST DETECTABLE LIMIT FOR     SERUM ALCOHOL IS 11 mg/dL     FOR MEDICAL PURPOSES ONLY  PRO B NATRIURETIC PEPTIDE     Status: None   Collection Time    10/20/13  1:46 AM      Result Value Ref Range   Pro B Natriuretic peptide (BNP) 119.6  0 - 125 pg/mL  I-STAT CG4 LACTIC ACID, ED     Status: None   Collection Time    10/20/13  2:13 AM       Result Value Ref Range   Lactic Acid, Venous 0.70  0.5 - 2.2 mmol/L  I-STAT TROPOININ, ED     Status: None   Collection Time    10/20/13  2:44 AM      Result Value Ref Range   Troponin i, poc 0.00  0.00 - 0.08 ng/mL   Comment 3            Comment: Due to the release kinetics of cTnI,     a negative result within the first hours     of the onset of symptoms does not rule out     myocardial infarction with certainty.     If myocardial infarction is still suspected,     repeat the test at appropriate intervals.  HEPATIC FUNCTION PANEL     Status: None   Collection Time    10/20/13  4:01 AM      Result Value Ref Range   Total Protein 6.8  6.0 - 8.3 g/dL   Albumin 3.7  3.5 - 5.2 g/dL   AST 21  0 - 37 U/L   ALT 13  0 - 53 U/L   Alkaline Phosphatase 99  39 - 117 U/L   Total Bilirubin 0.4  0.3 - 1.2 mg/dL   Bilirubin, Direct <0.2  0.0 - 0.3 mg/dL   Indirect Bilirubin NOT CALCULATED  0.3 - 0.9 mg/dL  AMMONIA     Status: None   Collection Time    10/20/13  4:01 AM      Result Value Ref Range   Ammonia 29  11 - 60 umol/L  URINALYSIS, ROUTINE W REFLEX MICROSCOPIC     Status: Abnormal   Collection Time    10/20/13  5:35 AM      Result Value Ref Range   Color, Urine AMBER (*) YELLOW   Comment: BIOCHEMICALS MAY  BE AFFECTED BY COLOR   APPearance CLEAR  CLEAR   Specific Gravity, Urine 1.025  1.005 - 1.030   pH 5.5  5.0 - 8.0   Glucose, UA NEGATIVE  NEGATIVE mg/dL   Hgb urine dipstick NEGATIVE  NEGATIVE   Bilirubin Urine NEGATIVE  NEGATIVE   Ketones, ur NEGATIVE  NEGATIVE mg/dL   Protein, ur NEGATIVE  NEGATIVE mg/dL   Urobilinogen, UA 0.2  0.0 - 1.0 mg/dL   Nitrite NEGATIVE  NEGATIVE   Leukocytes, UA NEGATIVE  NEGATIVE   Comment: MICROSCOPIC NOT DONE ON URINES WITH NEGATIVE PROTEIN, BLOOD, LEUKOCYTES, NITRITE, OR GLUCOSE <1000 mg/dL.    Dg Chest 1 View  10/20/2013   CLINICAL DATA:  Right-sided hip pain.  Shortness of breath.  EXAM: CHEST - 1 VIEW  COMPARISON:  Chest x-ray  05/04/2010.  FINDINGS: There is a large mass projecting over the lower mediastinum, new compared to the prior examination. Heart size is moderately enlarged, significantly increased compared to the prior study. Mild diffuse peribronchial cuffing. Indistinct interstitial markings. Cephalization of the pulmonary vasculature. No pleural effusions. The patient is rotated to the left on today's exam, resulting in distortion of the mediastinal contours and reduced diagnostic sensitivity and specificity for mediastinal pathology. Multiple healed right-sided rib fractures again noted.  IMPRESSION: 1. Large mediastinal mass is new compared to the prior examination. There is a possibility that this could simply represent a very large hiatal hernia, however, this is poorly evaluated on this portable chest x-ray. This could be better evaluated with a standing PA and lateral chest radiograph, or contrast-enhanced CT scan if clinically appropriate. 2. Interval development of moderate cardiomegaly, new compared to the prior examination at which time the heart was normal in size. This is associated with some cephalization of the pulmonary vasculature and slight indistinctness of the interstitial markings with some peribronchial cuffing, which could indicate some mild pulmonary edema.   Electronically Signed   By: Vinnie Langton M.D.   On: 10/20/2013 02:36   Dg Hip Complete Right  10/20/2013   CLINICAL DATA:  Right-sided hip pain.  EXAM: RIGHT HIP - COMPLETE 2+ VIEW  COMPARISON:  12/17/2010.  FINDINGS: Status post right hip total arthroplasty. The femoral head is superiorly dislocated relative to the prosthetic acetabular cup. Bony pelvis appears intact. No acute displaced fracture of the visualized portions of the femurs. 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 There is also some sclerosis in the left femoral head, which may suggest areas of avascular necrosis.  IMPRESSION: 1. Superior  dislocation of the right hip prosthesis. 2. 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   Electronically Signed   By: Vinnie Langton M.D.   On: 10/20/2013 02:32   Ct Head Wo Contrast  10/20/2013   CLINICAL DATA:  Altered mental status.  Dizziness.  EXAM: CT HEAD WITHOUT CONTRAST  TECHNIQUE: Contiguous axial images were obtained from the base of the skull through the vertex without intravenous contrast.  COMPARISON:  CT of the head performed 03/29/2003  FINDINGS: There is no evidence of acute infarction, mass lesion, or intra- or extra-axial hemorrhage on CT.  Mild periventricular white matter change likely reflects small vessel ischemic microangiopathy.  The posterior fossa, including the cerebellum, brainstem and fourth ventricle, is within normal limits. The third and lateral ventricles, and basal ganglia are unremarkable in appearance. The cerebral hemispheres are symmetric in appearance, with normal gray-white differentiation. No mass effect or midline shift is seen.  There  is no evidence of fracture; visualized osseous structures are unremarkable in appearance. The orbits are within normal limits. The paranasal sinuses and mastoid air cells are well-aerated. No significant soft tissue abnormalities are seen.  IMPRESSION: 1. No acute intracranial pathology seen on CT. 2. Mild small vessel ischemic microangiopathy.   Electronically Signed   By: Garald Balding M.D.   On: 10/20/2013 04:57    Review of Systems  Unable to perform ROS: mental acuity  Constitutional: Negative for fever.  Neurological:       Pt confused , somulent , needs stimulation to wake up. Reported took vicodin 10 and also xanax. Tried to get up and walk out of ER and stopped due to hip pain.    Blood pressure 129/74, pulse 98, temperature 97.8 F (36.6 C), temperature source Oral, resp. rate 17, height 6' 2" (1.88 m), weight 118.389 kg (261 lb), SpO2 96.00%. Physical Exam  Constitutional:  Patient confused, awake , not  sure how he dislocated hip  HENT:  Head: Normocephalic and atraumatic.  Eyes: Pupils are equal, round, and reactive to light.  Neck: Normal range of motion.  Cardiovascular: Normal rate.   Respiratory: Effort normal. He has wheezes. He has rales.  GI: Soft.  Musculoskeletal:  Right LE short and ER  Neurological:  Confused. Oriented times 2  Skin: Skin is warm and dry.    Assessment/Plan: 4th or 5th time hip dislocation after original THA done by Dr. Mayer Camel . Plan closed reduction of right hip. He will be admitted to hospitalist service for treatement of pneumonia.   Jarrah Babich C 10/20/2013, 6:09 AM

## 2013-10-20 NOTE — ED Provider Notes (Addendum)
CSN: [id number]     Arrival date & time 10/20/13  0104 History   First MD Initiated Contact with Patient 10/20/13 0119     Chief Complaint  Patient presents with  . Hip Injury     (Consider location/radiation/quality/duration/timing/severity/associated sxs/prior Treatment) HPI 43 year old male presents to emergency department from home via EMS with complaint of right hip pain.  Patient is extremely somnolent, difficult to arouse. Patient reported to nursing staff that he was walking up stairs when he felt his hip pop out of place.  To me, patient has given several different ways that his hip popped out of place.  He reports that he was lifting his dog out of the tub when it popped out, he reports that he was sitting on the couch and got to the bathroom when it popped out.  Patient reports he was doing work around the house to help his wife when it popped out.  Patient requires much physical stimulation to keep him awake and on track with his story.  He denies any alcohol, he denies any trauma, he reports taking his Norco as prescribed and Klonopin as needed.  Patient is oriented to self only.  He reports that it is Friday and then he has been out to a concert all night.  He reports that his wife is currently down town out with friends, but also reports that she is at work and he will be calling are seen. Past Medical History  Diagnosis Date  . Hypertension   . GERD (gastroesophageal reflux disease)   . H/O hiatal hernia    Past Surgical History  Procedure Laterality Date  . Appendectomy      as child  . Joint replacement  2005    Right Hip  . Esophagogastroduodenoscopy (egd) with propofol  02/17/2012    Procedure: ESOPHAGOGASTRODUODENOSCOPY (EGD) WITH PROPOFOL;  Surgeon: Willis Modena, MD;  Location: [facility] ENDOSCOPY;  Service: Endoscopy;  Laterality: N/A;  . Colonoscopy with propofol  02/17/2012    Procedure: COLONOSCOPY WITH PROPOFOL;  Surgeon: Willis Modena, MD;  Location: [facility] ENDOSCOPY;   Service: Endoscopy;  Laterality: N/A;  . Esophagogastroduodenoscopy Left 09/19/2013    Procedure: ESOPHAGOGASTRODUODENOSCOPY (EGD);  Surgeon: Willis Modena, MD;  Location: [facility] ENDOSCOPY;  Service: Endoscopy;  Laterality: Left;   History reviewed. No pertinent family history. History  Substance Use Topics  . Smoking status: Never Smoker   . Smokeless tobacco: Never Used  . Alcohol Use: No    Review of Systems  Unable to perform ROS: Mental status change      Allergies  Ambien; Clonidine derivatives; and Nu-iron  Home Medications   Prior to Admission medications   Medication Sig Start Date End Date Taking? Authorizing Provider  ALPRAZolam Prudy Feeler) 1 MG tablet Take 1 mg by mouth at bedtime as needed for anxiety. *may repeat in 3-4 hrs if needed*   Yes Historical Provider, MD  atorvastatin (LIPITOR) 40 MG tablet Take 40 mg by mouth daily.   Yes Historical Provider, MD  Fe Fum-FA-B Cmp-C-Zn-Mg-Mn-Cu (HEMOCYTE-PLUS PO) Take 1 capsule by mouth daily.   Yes Historical Provider, MD  HYDROcodone-acetaminophen (NORCO) 10-325 MG per tablet Take 1 tablet by mouth every 6 (six) hours as needed. For pain   Yes Historical Provider, MD  loratadine (CLARITIN) 10 MG tablet Take 10 mg by mouth daily as needed for allergies.    Yes Historical Provider, MD  metoprolol (LOPRESSOR) 50 MG tablet Take 50 mg by mouth 2 (two) times daily.   Yes Historical  Provider, MD  pantoprazole (PROTONIX) 40 MG tablet Take 40 mg by mouth 2 (two) times daily.   Yes Historical Provider, MD   BP 129/74  Pulse 98  Temp(Src) 97.8 F (36.6 C) (Oral)  Resp 17  Ht 6\' 2"  (1.88 m)  Wt 261 lb (118.389 kg)  BMI 33.50 kg/m2  SpO2 96% Physical Exam  Nursing note and vitals reviewed. Constitutional:  Patient is extremely somnolent and difficult to arouse  HENT:  Right Ear: External ear normal.  Left Ear: External ear normal.  Dry mucous membranes  Eyes:  Pinpoint pupils  Neck: Normal range of motion. Neck supple. No JVD  present. No tracheal deviation present. No thyromegaly present.  Cardiovascular: Normal rate, regular rhythm, normal heart sounds and intact distal pulses.  Exam reveals no gallop and no friction rub.   No murmur heard. Pulmonary/Chest: No stridor.  The patient has sonorous respirations, he has loud extra glottic noises that only clear briefly with coughing.  He has coarse rhonchi throughout both lungs  Abdominal: Soft. Bowel sounds are normal. He exhibits no distension and no mass. There is no tenderness. There is no rebound and no guarding.  Musculoskeletal:  Patient has limited range of motion of his right hip.  He is distally neurovascularly intact  Lymphadenopathy:    He has no cervical adenopathy.  Skin: Skin is dry. No rash noted. No erythema. There is pallor.  Skin is cool and clammy    ED Course  Procedures (including critical care time) Labs Review Labs Reviewed  CBC WITH DIFFERENTIAL - Abnormal; Notable for the following:    WBC 11.3 (*)    RBC 3.70 (*)    Hemoglobin 9.3 (*)    HCT 30.4 (*)    MCH 25.1 (*)    RDW 16.4 (*)    Neutrophils Relative % 82 (*)    Neutro Abs 9.3 (*)    Lymphocytes Relative 7 (*)    All other components within normal limits  BASIC METABOLIC PANEL - Abnormal; Notable for the following:    Glucose, Bld 126 (*)    Creatinine, Ser 1.37 (*)    GFR calc non Af Amer 62 (*)    GFR calc Af Amer 72 (*)    All other components within normal limits  URINALYSIS, ROUTINE W REFLEX MICROSCOPIC - Abnormal; Notable for the following:    Color, Urine AMBER (*)    All other components within normal limits  ETHANOL  PRO B NATRIURETIC PEPTIDE  HEPATIC FUNCTION PANEL  AMMONIA  URINE RAPID DRUG SCREEN (HOSP PERFORMED)  I-STAT CG4 LACTIC ACID, ED  Rosezena SensorI-STAT TROPOININ, ED    Imaging Review Dg Chest 1 View  10/20/2013   CLINICAL DATA:  Right-sided hip pain.  Shortness of breath.  EXAM: CHEST - 1 VIEW  COMPARISON:  Chest x-ray 05/04/2010.  FINDINGS: There is a  large mass projecting over the lower mediastinum, new compared to the prior examination. Heart size is moderately enlarged, significantly increased compared to the prior study. Mild diffuse peribronchial cuffing. Indistinct interstitial markings. Cephalization of the pulmonary vasculature. No pleural effusions. The patient is rotated to the left on today's exam, resulting in distortion of the mediastinal contours and reduced diagnostic sensitivity and specificity for mediastinal pathology. Multiple healed right-sided rib fractures again noted.  IMPRESSION: 1. Large mediastinal mass is new compared to the prior examination. There is a possibility that this could simply represent a very large hiatal hernia, however, this is poorly evaluated on this portable chest  x-ray. This could be better evaluated with a standing PA and lateral chest radiograph, or contrast-enhanced CT scan if clinically appropriate. 2. Interval development of moderate cardiomegaly, new compared to the prior examination at which time the heart was normal in size. This is associated with some cephalization of the pulmonary vasculature and slight indistinctness of the interstitial markings with some peribronchial cuffing, which could indicate some mild pulmonary edema.   Electronically Signed   By: Trudie Reed M.D.   On: 10/20/2013 02:36   Dg Hip Complete Right  10/20/2013   CLINICAL DATA:  Right-sided hip pain.  EXAM: RIGHT HIP - COMPLETE 2+ VIEW  COMPARISON:  12/17/2010.  FINDINGS: Status post right hip total arthroplasty. The femoral head is superiorly dislocated relative to the prosthetic acetabular cup. Bony pelvis appears intact. No acute displaced fracture of the visualized portions of the femurs. 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 There is also some sclerosis in the left femoral head, which may suggest areas of avascular necrosis.  IMPRESSION: 1. 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 2. 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   Electronically Signed   By: Trudie Reed M.D.   On: 10/20/2013 02:32   Ct Head Wo Contrast  10/20/2013   CLINICAL DATA:  Altered mental status.  Dizziness.  EXAM: CT HEAD WITHOUT CONTRAST  TECHNIQUE: Contiguous axial images were obtained from the base of the skull through the vertex without intravenous contrast.  COMPARISON:  CT of the head performed 03/29/2003  FINDINGS: There is no evidence of acute infarction, mass lesion, or intra- or extra-axial hemorrhage on CT.  Mild periventricular white matter change likely reflects small vessel ischemic microangiopathy.  The posterior fossa, including the cerebellum, brainstem and fourth ventricle, is within normal limits. The third and lateral ventricles, and basal ganglia are unremarkable in appearance. The cerebral hemispheres are symmetric in appearance, with normal gray-white differentiation. No mass effect or midline shift is seen.  There is no evidence of fracture; visualized osseous structures are unremarkable in appearance. The orbits are within normal limits. The paranasal sinuses and mastoid air cells are well-aerated. No significant soft tissue abnormalities are seen.  IMPRESSION: 1. No acute intracranial pathology seen on CT. 2. Mild small vessel ischemic microangiopathy.   Electronically Signed   By: Roanna Raider M.D.   On: 10/20/2013 04:57   Ct Chest W Contrast  10/20/2013   CLINICAL DATA:  Congestion and shortness of breath. Abnormal findings on chest radiograph. CT recommended for further evaluation.  EXAM: CT CHEST WITH CONTRAST  TECHNIQUE: Multidetector CT imaging of the chest was performed during intravenous contrast administration.  CONTRAST:  75mL OMNIPAQUE IOHEXOL 300 MG/ML  SOLN  COMPARISON:  Chest radiographs performed earlier today at 2:00 a.m.  FINDINGS: Patchy left lower lobe airspace opacities are concerning for pneumonia. Minimal airspace opacities are seen  within the left upper lobe. Minimal right-sided atelectasis is noted. No pleural effusion or pneumothorax is seen. No masses are identified. No significant pleural effusion or pneumothorax is seen.  A large hiatal hernia is noted, with surrounding fat, explaining the contour visualized on chest radiograph. A small nonspecific node is noted adjacent to the hiatal hernia. Scattered coronary artery calcifications are seen. The mediastinum is otherwise unremarkable. No mediastinal lymphadenopathy is seen. No pericardial effusion is noted.  The abdomen is unremarkable in appearance. No axillary lymphadenopathy is seen. The visualized portions of the liver and spleen are unremarkable. The visualized portions  of the gallbladder, pancreas, adrenal glands and kidneys are within normal limits.  No acute osseous abnormalities are identified.  IMPRESSION: 1. Patchy left lower lobe airspace opacities and minimal left upper lobe airspace opacities are compatible with pneumonia. 2. Minimal right-sided atelectasis noted. 3. Large hiatal hernia noted, explaining the contour visualized on chest radiograph. No mass seen. 4. Scattered coronary artery calcifications seen.   Electronically Signed   By: Roanna Raider M.D.   On: 10/20/2013 06:19     EKG Interpretation None      Date: 10/20/2013  Rate: 96  Rhythm: normal sinus rhythm  QRS Axis: normal  Intervals: normal  ST/T Wave abnormalities: normal  Conduction Disutrbances:none  Narrative Interpretation:   Old EKG Reviewed: none available  CRITICAL CARE Performed by: Olivia Mackie Total critical care time: 60 min Critical care time was exclusive of separately billable procedures and treating other patients. Critical care was necessary to treat or prevent imminent or life-threatening deterioration. Critical care was time spent personally by me on the following activities: development of treatment plan with patient and/or surrogate as well as nursing, discussions  with consultants, evaluation of patient's response to treatment, examination of patient, obtaining history from patient or surrogate, ordering and performing treatments and interventions, ordering and review of laboratory studies, ordering and review of radiographic studies, pulse oximetry and re-evaluation of patient's condition.  MDM   Final diagnoses:  Altered mental status, unspecified altered mental status type  Dislocation of hip prosthesis, subsequent encounter  CAP (community acquired pneumonia)  Hiatal hernia    43 year old male with altered mental status and right hip pain.  Initial x-ray show superior dislocation of the right hip.  One view chest shows mediastinal mass, pulmonary edema, cardiomegaly.  We'll continue workup for altered mental status, we'll need to work to consult and anesthesia assistance with a hip reduction attempts.  Patient is far too somnolent for conscious sedation in the ED.  I'm afraid for further respiratory compromise with aspiration if we attempt to do it in the ED with propofol. 5:08 AM Able to speak with wife after several attempts.  She reports pt woke her in the night because he was unable to go to the bathroom due to right hip pain.  She reports he has h/o dislocation of the hip.  She is unsure of when or how it occurred.  She reports he take seroquel, pain medications, anxiety medications.  She reports he was normal prior to going to bed-no altered mental status.  She denies any breathing difficulties up to this point.  Case discussed with Dr. Ophelia Charter on call for [facility] orthopedics.  He requests admission to hospitalist given patient's altered mental status.  Plan for reduction in the OR.  Will discuss with hospitalist once CT scan results are returned.  Olivia Mackie, MD 10/20/13 0550  6:25 AM Pt now reports he was out all day yesterday watersliding and in the wind.  He is still unsure how his hip came to be out of place.  Still very confused.  CT  chest with large hiatal hernia.  PNA noted on left.  Will start antibiotics for CAP  Olivia Mackie, MD 10/20/13 1610  Olivia Mackie, MD 10/20/13 [phone]

## 2013-10-20 NOTE — ED Notes (Signed)
Security paged to room A8, patient is highly agitated.

## 2013-10-20 NOTE — Progress Notes (Signed)
Orthopedic Tech Progress Note Patient Details:  Todd HightJohn D Recker 09/05/1970 [id number] Patient already has brace on. Patient ID: Todd HightJohn D [name], male   DOB: 09/16/1970, 43 y.o.   MRN: [id number]   [name], [name] 10/20/2013, 10:10 AM

## 2013-10-20 NOTE — H&P (View-Only) (Signed)
Reason for Consult:right  THA dislocation Referring Physician: Dr. Otter     ERMD  Todd [name] is an 42 y.o. male.  HPI: 42 yo male confused with pulmonary edema and multiple Hx of how his hip may have popped out.   CT chest large hiatal hernia and pneumonia. Xray right hip - dislocation.   Wife per phone states he was in bed when she got home and when he tried to get up he could not . Not sure what he did before he crawled to bed to pop out hip.   Past Medical History  Diagnosis Date  . Hypertension   . GERD (gastroesophageal reflux disease)   . H/O hiatal hernia     Past Surgical History  Procedure Laterality Date  . Appendectomy      as child  . Joint replacement  2005    Right Hip  . Esophagogastroduodenoscopy (egd) with propofol  02/17/2012    Procedure: ESOPHAGOGASTRODUODENOSCOPY (EGD) WITH PROPOFOL;  Surgeon: William Outlaw, MD;  Location: [facility];  Service: Endoscopy;  Laterality: N/A;  . Colonoscopy with propofol  02/17/2012    Procedure: COLONOSCOPY WITH PROPOFOL;  Surgeon: William Outlaw, MD;  Location: [facility];  Service: Endoscopy;  Laterality: N/A;  . Esophagogastroduodenoscopy Left 09/19/2013    Procedure: ESOPHAGOGASTRODUODENOSCOPY (EGD);  Surgeon: William Outlaw, MD;  Location: [facility];  Service: Endoscopy;  Laterality: Left;    History reviewed. No pertinent family history.  Social History:  reports that he has never smoked. He has never used smokeless tobacco. He reports that he does not drink alcohol or use illicit drugs.  Allergies:  Allergies  Allergen Reactions  . Ambien [Zolpidem Tartrate]     sedation  . Clonidine Derivatives     Caused Steven Johnson syndrome  . Nu-Iron [Polysaccharide Iron Complex] Diarrhea    Medications: I have reviewed the patient's current medications.  Results for orders placed during the hospital encounter of 10/20/13 (from the past 48 hour(s))  CBC WITH DIFFERENTIAL     Status: Abnormal   Collection Time     10/20/13  1:46 AM      Result Value Ref Range   WBC 11.3 (*) 4.0 - 10.5 K/uL   RBC 3.70 (*) 4.22 - 5.81 MIL/uL   Hemoglobin 9.3 (*) 13.0 - 17.0 g/dL   HCT 30.4 (*) 39.0 - 52.0 %   MCV 82.2  78.0 - 100.0 fL   MCH 25.1 (*) 26.0 - 34.0 pg   MCHC 30.6  30.0 - 36.0 g/dL   RDW 16.4 (*) 11.5 - 15.5 %   Platelets 316  150 - 400 K/uL   Neutrophils Relative % 82 (*) 43 - 77 %   Neutro Abs 9.3 (*) 1.7 - 7.7 K/uL   Lymphocytes Relative 7 (*) 12 - 46 %   Lymphs Abs 0.8  0.7 - 4.0 K/uL   Monocytes Relative 9  3 - 12 %   Monocytes Absolute 1.0  0.1 - 1.0 K/uL   Eosinophils Relative 2  0 - 5 %   Eosinophils Absolute 0.2  0.0 - 0.7 K/uL   Basophils Relative 0  0 - 1 %   Basophils Absolute 0.0  0.0 - 0.1 K/uL  BASIC METABOLIC PANEL     Status: Abnormal   Collection Time    10/20/13  1:46 AM      Result Value Ref Range   Sodium 141  137 - 147 mEq/L   Potassium 3.8  3.7 - 5.3 mEq/L     Chloride 103  96 - 112 mEq/L   CO2 28  19 - 32 mEq/L   Glucose, Bld 126 (*) 70 - 99 mg/dL   BUN 19  6 - 23 mg/dL   Creatinine, Ser 1.37 (*) 0.50 - 1.35 mg/dL   Calcium 8.7  8.4 - 10.5 mg/dL   GFR calc non Af Amer 62 (*) >90 mL/min   GFR calc Af Amer 72 (*) >90 mL/min   Comment: (NOTE)     The eGFR has been calculated using the CKD EPI equation.     This calculation has not been validated in all clinical situations.     eGFR's persistently <90 mL/min signify possible Chronic Kidney     Disease.   Anion gap 10  5 - 15  ETHANOL     Status: None   Collection Time    10/20/13  1:46 AM      Result Value Ref Range   Alcohol, Ethyl (B) <11  0 - 11 mg/dL   Comment:            LOWEST DETECTABLE LIMIT FOR     SERUM ALCOHOL IS 11 mg/dL     FOR MEDICAL PURPOSES ONLY  PRO B NATRIURETIC PEPTIDE     Status: None   Collection Time    10/20/13  1:46 AM      Result Value Ref Range   Pro B Natriuretic peptide (BNP) 119.6  0 - 125 pg/mL  I-STAT CG4 LACTIC ACID, ED     Status: None   Collection Time    10/20/13  2:13 AM       Result Value Ref Range   Lactic Acid, Venous 0.70  0.5 - 2.2 mmol/L  I-STAT TROPOININ, ED     Status: None   Collection Time    10/20/13  2:44 AM      Result Value Ref Range   Troponin i, poc 0.00  0.00 - 0.08 ng/mL   Comment 3            Comment: Due to the release kinetics of cTnI,     a negative result within the first hours     of the onset of symptoms does not rule out     myocardial infarction with certainty.     If myocardial infarction is still suspected,     repeat the test at appropriate intervals.  HEPATIC FUNCTION PANEL     Status: None   Collection Time    10/20/13  4:01 AM      Result Value Ref Range   Total Protein 6.8  6.0 - 8.3 g/dL   Albumin 3.7  3.5 - 5.2 g/dL   AST 21  0 - 37 U/L   ALT 13  0 - 53 U/L   Alkaline Phosphatase 99  39 - 117 U/L   Total Bilirubin 0.4  0.3 - 1.2 mg/dL   Bilirubin, Direct <0.2  0.0 - 0.3 mg/dL   Indirect Bilirubin NOT CALCULATED  0.3 - 0.9 mg/dL  AMMONIA     Status: None   Collection Time    10/20/13  4:01 AM      Result Value Ref Range   Ammonia 29  11 - 60 umol/L  URINALYSIS, ROUTINE W REFLEX MICROSCOPIC     Status: Abnormal   Collection Time    10/20/13  5:35 AM      Result Value Ref Range   Color, Urine AMBER (*) YELLOW   Comment: BIOCHEMICALS MAY   BE AFFECTED BY COLOR   APPearance CLEAR  CLEAR   Specific Gravity, Urine 1.025  1.005 - 1.030   pH 5.5  5.0 - 8.0   Glucose, UA NEGATIVE  NEGATIVE mg/dL   Hgb urine dipstick NEGATIVE  NEGATIVE   Bilirubin Urine NEGATIVE  NEGATIVE   Ketones, ur NEGATIVE  NEGATIVE mg/dL   Protein, ur NEGATIVE  NEGATIVE mg/dL   Urobilinogen, UA 0.2  0.0 - 1.0 mg/dL   Nitrite NEGATIVE  NEGATIVE   Leukocytes, UA NEGATIVE  NEGATIVE   Comment: MICROSCOPIC NOT DONE ON URINES WITH NEGATIVE PROTEIN, BLOOD, LEUKOCYTES, NITRITE, OR GLUCOSE <1000 mg/dL.    Dg Chest 1 View  10/20/2013   CLINICAL DATA:  Right-sided hip pain.  Shortness of breath.  EXAM: CHEST - 1 VIEW  COMPARISON:  Chest x-ray  05/04/2010.  FINDINGS: There is a large mass projecting over the lower mediastinum, new compared to the prior examination. Heart size is moderately enlarged, significantly increased compared to the prior study. Mild diffuse peribronchial cuffing. Indistinct interstitial markings. Cephalization of the pulmonary vasculature. No pleural effusions. The patient is rotated to the left on today's exam, resulting in distortion of the mediastinal contours and reduced diagnostic sensitivity and specificity for mediastinal pathology. Multiple healed right-sided rib fractures again noted.  IMPRESSION: 1. Large mediastinal mass is new compared to the prior examination. There is a possibility that this could simply represent a very large hiatal hernia, however, this is poorly evaluated on this portable chest x-ray. This could be better evaluated with a standing PA and lateral chest radiograph, or contrast-enhanced CT scan if clinically appropriate. 2. Interval development of moderate cardiomegaly, new compared to the prior examination at which time the heart was normal in size. This is associated with some cephalization of the pulmonary vasculature and slight indistinctness of the interstitial markings with some peribronchial cuffing, which could indicate some mild pulmonary edema.   Electronically Signed   By: Daniel  Entrikin M.D.   On: 10/20/2013 02:36   Dg Hip Complete Right  10/20/2013   CLINICAL DATA:  Right-sided hip pain.  EXAM: RIGHT HIP - COMPLETE 2+ VIEW  COMPARISON:  12/17/2010.  FINDINGS: Status post right hip total arthroplasty. The femoral head is superiorly dislocated relative to the prosthetic acetabular cup. Bony pelvis appears intact. No acute displaced fracture of the visualized portions of the femurs. 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 There is also some sclerosis in the left femoral head, which may suggest areas of avascular necrosis.  IMPRESSION: 1. Superior  dislocation of the right hip prosthesis. 2. 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   Electronically Signed   By: Daniel  Entrikin M.D.   On: 10/20/2013 02:32   Ct Head Wo Contrast  10/20/2013   CLINICAL DATA:  Altered mental status.  Dizziness.  EXAM: CT HEAD WITHOUT CONTRAST  TECHNIQUE: Contiguous axial images were obtained from the base of the skull through the vertex without intravenous contrast.  COMPARISON:  CT of the head performed 03/29/2003  FINDINGS: There is no evidence of acute infarction, mass lesion, or intra- or extra-axial hemorrhage on CT.  Mild periventricular white matter change likely reflects small vessel ischemic microangiopathy.  The posterior fossa, including the cerebellum, brainstem and fourth ventricle, is within normal limits. The third and lateral ventricles, and basal ganglia are unremarkable in appearance. The cerebral hemispheres are symmetric in appearance, with normal gray-white differentiation. No mass effect or midline shift is seen.  There   is no evidence of fracture; visualized osseous structures are unremarkable in appearance. The orbits are within normal limits. The paranasal sinuses and mastoid air cells are well-aerated. No significant soft tissue abnormalities are seen.  IMPRESSION: 1. No acute intracranial pathology seen on CT. 2. Mild small vessel ischemic microangiopathy.   Electronically Signed   By: Garald Balding M.D.   On: 10/20/2013 04:57    Review of Systems  Unable to perform ROS: mental acuity  Constitutional: Negative for fever.  Neurological:       Pt confused , somulent , needs stimulation to wake up. Reported took vicodin 10 and also xanax. Tried to get up and walk out of ER and stopped due to hip pain.    Blood pressure 129/74, pulse 98, temperature 97.8 F (36.6 C), temperature source Oral, resp. rate 17, height 6' 2" (1.88 m), weight 118.389 kg (261 lb), SpO2 96.00%. Physical Exam  Constitutional:  Patient confused, awake , not  sure how he dislocated hip  HENT:  Head: Normocephalic and atraumatic.  Eyes: Pupils are equal, round, and reactive to light.  Neck: Normal range of motion.  Cardiovascular: Normal rate.   Respiratory: Effort normal. He has wheezes. He has rales.  GI: Soft.  Musculoskeletal:  Right LE short and ER  Neurological:  Confused. Oriented times 2  Skin: Skin is warm and dry.    Assessment/Plan: 4th or 5th time hip dislocation after original THA done by Dr. Mayer Camel . Plan closed reduction of right hip. He will be admitted to hospitalist service for treatement of pneumonia.   Athea Haley C 10/20/2013, 6:09 AM
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
## 2013-10-20 NOTE — OR Nursing (Signed)
Bag with patient belongings brought to OR with patient by Darcey NoraKaren James, CRNA. reamined with patient on transfer to PACU.

## 2013-10-20 NOTE — Progress Notes (Signed)
Utilization review completed.  

## 2013-10-20 NOTE — Anesthesia Postprocedure Evaluation (Signed)
  Anesthesia Post-op Note  Patient: Todd [name]  Procedure(s) Performed: Procedure(s): CLOSED REDUCTION HIP (Right)  Patient Location: PACU  Anesthesia Type:General  Level of Consciousness: awake  Airway and Oxygen Therapy: Patient Spontanous Breathing  Post-op Pain: mild  Post-op Assessment: Post-op Vital signs reviewed  Post-op Vital Signs: Reviewed  Last Vitals:  Filed Vitals:   10/20/13 0745  BP: 111/53  Pulse:   Temp:   Resp:     Complications: No apparent anesthesia complications

## 2013-10-20 NOTE — Interval H&P Note (Signed)
History and Physical Interval Note:  10/20/2013 6:46 AM  Todd [name]  has presented today for surgery, with the diagnosis of Dislocated right hip  The various methods of treatment have been discussed with the patient and family. After consideration of risks, benefits and other options for treatment, the patient has consented to  Procedure(s): CLOSED REDUCTION HIP (Right) as a surgical intervention .  The patient's history has been reviewed, patient examined, no change in status, stable for surgery.  I have reviewed the patient's chart and labs.  Questions were answered to the patient's satisfaction.  Discussed plan with patients wife by phone. Plan hip reduction and will be admitted to medical service for pneumonia tx after hip reduction   [name]

## 2013-10-20 NOTE — Interval H&P Note (Signed)
History and Physical Interval Note:  10/20/2013 6:46 AM  Caro HightJohn D Molnar  has presented today for surgery, with the diagnosis of Dislocated right hip  The various methods of treatment have been discussed with the patient and family. After consideration of risks, benefits and other options for treatment, the patient has consented to  Procedure(s): CLOSED REDUCTION HIP (Right) as a surgical intervention .  The patient's history has been reviewed, patient examined, no change in status, stable for surgery.  I have reviewed the patient's chart and labs.  Questions were answered to the patient's satisfaction.     Murry Khiev C

## 2013-10-20 NOTE — H&P (Signed)
[facility] History and Physical  Todd [name] ZOX:[id number] DOB: Aug 30, 1970 DOA: 10/20/2013  Referring physician: EDP PCP: Cala Bradford, MD   Chief Complaint: Hip pain  HPI: Todd [name] is a 43 y.o. male with PMh of HTN, GERD, Dyslipidemia, h/o hip replacement and multiple prior dislocations presents to the ER after noticing R hip pain and a popping sensation in his R hip after attempting to walk his dog yesterday, different versions of this were told to EDP. He also has chronic R hip pain and takes 6 vicodin a day for this. When he presented to the ER her was drowsy and lethargic, mentation subsequently improved, was taken to the OR per ORtho for Closed reduction of his HIp. He was also noted to have patchy LLL airspace opacities compatible with pneumonia, he denies any cough, congestion or dyspnea    Review of Systems:  Constitutional:  No weight loss, night sweats, Fevers, chills, fatigue.  HEENT:  No headaches, Difficulty swallowing,Tooth/dental problems,Sore throat,  No sneezing, itching, ear ache, nasal congestion, post nasal drip,  Cardio-vascular:  No chest pain, Orthopnea, PND, swelling in lower extremities, anasarca, dizziness, palpitations  GI:  No heartburn, indigestion, abdominal pain, nausea, vomiting, diarrhea, change in bowel habits, loss of appetite  Resp:  No shortness of breath with exertion or at rest. No excess mucus, no productive cough, No non-productive cough, No coughing up of blood.No change in color of mucus.No wheezing.No chest wall deformity  Skin:  no rash or lesions.  GU:  no dysuria, change in color of urine, no urgency or frequency. No flank pain.  Musculoskeletal:  No joint pain or swelling. No decreased range of motion. No back pain., chronic Hip pain Psych:  No change in mood or affect. No depression or anxiety. No memory loss.   Past Medical History  Diagnosis Date  . Hypertension   . GERD (gastroesophageal reflux disease)     . H/O hiatal hernia    Past Surgical History  Procedure Laterality Date  . Appendectomy      as child  . Joint replacement  2005    Right Hip  . Esophagogastroduodenoscopy (egd) with propofol  02/17/2012    Procedure: ESOPHAGOGASTRODUODENOSCOPY (EGD) WITH PROPOFOL;  Surgeon: Willis Modena, MD;  Location: [facility] ENDOSCOPY;  Service: Endoscopy;  Laterality: N/A;  . Colonoscopy with propofol  02/17/2012    Procedure: COLONOSCOPY WITH PROPOFOL;  Surgeon: Willis Modena, MD;  Location: [facility] ENDOSCOPY;  Service: Endoscopy;  Laterality: N/A;  . Esophagogastroduodenoscopy Left 09/19/2013    Procedure: ESOPHAGOGASTRODUODENOSCOPY (EGD);  Surgeon: Willis Modena, MD;  Location: [facility] ENDOSCOPY;  Service: Endoscopy;  Laterality: Left;   Social History:  reports that he has never smoked. He has never used smokeless tobacco. He reports that he does not drink alcohol or use illicit drugs.  Allergies  Allergen Reactions  . Ambien [Zolpidem Tartrate]     sedation  . Clonidine Derivatives     Caused Trudie Buckler syndrome  . Nu-Iron [Polysaccharide Iron Complex] Diarrhea    History reviewed. No pertinent family history.   Prior to Admission medications   Medication Sig Start Date End Date Taking? Authorizing Provider  ALPRAZolam Prudy Feeler) 1 MG tablet Take 1 mg by mouth at bedtime as needed for anxiety. *may repeat in 3-4 hrs if needed*   Yes Historical Provider, MD  atorvastatin (LIPITOR) 40 MG tablet Take 40 mg by mouth daily.   Yes Historical Provider, MD  Fe Fum-FA-B Cmp-C-Zn-Mg-Mn-Cu (HEMOCYTE-PLUS PO) Take 1 capsule by mouth daily.  Yes Historical Provider, MD  HYDROcodone-acetaminophen (NORCO) 10-325 MG per tablet Take 1 tablet by mouth every 6 (six) hours as needed. For pain   Yes Historical Provider, MD  loratadine (CLARITIN) 10 MG tablet Take 10 mg by mouth daily as needed for allergies.    Yes Historical Provider, MD  metoprolol (LOPRESSOR) 50 MG tablet Take 50 mg by mouth 2 (two) times daily.    Yes Historical Provider, MD  pantoprazole (PROTONIX) 40 MG tablet Take 40 mg by mouth 2 (two) times daily.   Yes Historical Provider, MD   Physical Exam: Filed Vitals:   10/20/13 0845 10/20/13 0900 10/20/13 0915 10/20/13 0959  BP: 110/52  104/76 109/60  Pulse: 94 93 97 95  Temp:    99.6 F (37.6 C)  TempSrc:    Oral  Resp: 27 24 19 18   Height:    6\' 2"  (1.88 m)  Weight:    117.935 kg (260 lb)  SpO2: 96% 95% 94% 92%    Wt Readings from Last 3 Encounters:  10/20/13 117.935 kg (260 lb)  10/20/13 117.935 kg (260 lb)  09/19/13 118.48 kg (261 lb 3.2 oz)    General:  Sleepy, tired, but arousible, oriented x3 Eyes: PERRL, normal lids, irises & conjunctiva ENT: grossly normal  lips & tongue Neck: no LAD, masses or thyromegaly Cardiovascular: RRR, no m/r/g. No LE edema. Respiratory: CTA bilaterally, no w/r/r. Normal respiratory effort. Abdomen: soft, Nt, ND, BS present Skin: no rash or induration seen on limited exam Musculoskeletal: R knee in immobilizer Psychiatric: grossly normal mood and affect, speech fluent and appropriate Neurologic: grossly non-focal.          Labs on Admission:  Basic Metabolic Panel:  Recent Labs Lab 10/20/13 0146  NA 141  K 3.8  CL 103  CO2 28  GLUCOSE 126*  BUN 19  CREATININE 1.37*  CALCIUM 8.7   Liver Function Tests:  Recent Labs Lab 10/20/13 0401  AST 21  ALT 13  ALKPHOS 99  BILITOT 0.4  PROT 6.8  ALBUMIN 3.7   No results found for this basename: LIPASE, AMYLASE,  in the last 168 hours  Recent Labs Lab 10/20/13 0401  AMMONIA 29   CBC:  Recent Labs Lab 10/20/13 0146  WBC 11.3*  NEUTROABS 9.3*  HGB 9.3*  HCT 30.4*  MCV 82.2  PLT 316   Cardiac Enzymes: No results found for this basename: CKTOTAL, CKMB, CKMBINDEX, TROPONINI,  in the last 168 hours  BNP (last 3 results)  Recent Labs  10/20/13 0146  PROBNP 119.6   CBG: No results found for this basename: GLUCAP,  in the last 168 hours  Radiological Exams on  Admission: Dg Chest 1 View  10/20/2013   CLINICAL DATA:  Right-sided hip pain.  Shortness of breath.  EXAM: CHEST - 1 VIEW  COMPARISON:  Chest x-ray 05/04/2010.  FINDINGS: There is a large mass projecting over the lower mediastinum, new compared to the prior examination. Heart size is moderately enlarged, significantly increased compared to the prior study. Mild diffuse peribronchial cuffing. Indistinct interstitial markings. Cephalization of the pulmonary vasculature. No pleural effusions. The patient is rotated to the left on today's exam, resulting in distortion of the mediastinal contours and reduced diagnostic sensitivity and specificity for mediastinal pathology. Multiple healed right-sided rib fractures again noted.  IMPRESSION: 1. Large mediastinal mass is new compared to the prior examination. There is a possibility that this could simply represent a very large hiatal hernia, however, this is poorly evaluated  on this portable chest x-ray. This could be better evaluated with a standing PA and lateral chest radiograph, or contrast-enhanced CT scan if clinically appropriate. 2. Interval development of moderate cardiomegaly, new compared to the prior examination at which time the heart was normal in size. This is associated with some cephalization of the pulmonary vasculature and slight indistinctness of the interstitial markings with some peribronchial cuffing, which could indicate some mild pulmonary edema.   Electronically Signed   By: Trudie Reedaniel  Entrikin M.D.   On: 10/20/2013 02:36   Dg Hip Complete Right  10/20/2013   CLINICAL DATA:  Right-sided hip pain.  EXAM: RIGHT HIP - COMPLETE 2+ VIEW  COMPARISON:  12/17/2010.  FINDINGS: Status post right hip total arthroplasty. The femoral head is superiorly dislocated relative to the prosthetic acetabular cup. Bony pelvis appears intact. No acute displaced fracture of the visualized portions of the femurs. 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 There is also some sclerosis in the left femoral head, which may suggest areas of avascular necrosis.  IMPRESSION: 1. Superior dislocation of the right hip prosthesis. 2. 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   Electronically Signed   By: Trudie Reedaniel  Entrikin M.D.   On: 10/20/2013 02:32   Dg Hip Operative Right  10/20/2013   CLINICAL DATA:  Reduction of right hip replacement dislocation  EXAM: DG OPERATIVE RIGHT HIP  TECHNIQUE: A single spot fluoroscopic AP image of the right hip is submitted.  COMPARISON:  Right hip films of 10/20/2013  FINDINGS: A single portable postreduction view shows the prosthetic femoral head now within the acetabular cup, having reduced the dislocation. No acute fracture is seen. No complicating features are noted.  IMPRESSION: Reduction of dislocation of right total hip replacement components.   Electronically Signed   By: Dwyane DeePaul  Barry M.D.   On: 10/20/2013 08:21   Ct Head Wo Contrast  10/20/2013   CLINICAL DATA:  Altered mental status.  Dizziness.  EXAM: CT HEAD WITHOUT CONTRAST  TECHNIQUE: Contiguous axial images were obtained from the base of the skull through the vertex without intravenous contrast.  COMPARISON:  CT of the head performed 03/29/2003  FINDINGS: There is no evidence of acute infarction, mass lesion, or intra- or extra-axial hemorrhage on CT.  Mild periventricular white matter change likely reflects small vessel ischemic microangiopathy.  The posterior fossa, including the cerebellum, brainstem and fourth ventricle, is within normal limits. The third and lateral ventricles, and basal ganglia are unremarkable in appearance. The cerebral hemispheres are symmetric in appearance, with normal gray-white differentiation. No mass effect or midline shift is seen.  There is no evidence of fracture; visualized osseous structures are unremarkable in appearance. The orbits are within normal limits. The paranasal sinuses and mastoid air cells are  well-aerated. No significant soft tissue abnormalities are seen.  IMPRESSION: 1. No acute intracranial pathology seen on CT. 2. Mild small vessel ischemic microangiopathy.   Electronically Signed   By: Roanna RaiderJeffery  Chang M.D.   On: 10/20/2013 04:57   Ct Chest W Contrast  10/20/2013   CLINICAL DATA:  Congestion and shortness of breath. Abnormal findings on chest radiograph. CT recommended for further evaluation.  EXAM: CT CHEST WITH CONTRAST  TECHNIQUE: Multidetector CT imaging of the chest was performed during intravenous contrast administration.  CONTRAST:  75mL OMNIPAQUE IOHEXOL 300 MG/ML  SOLN  COMPARISON:  Chest radiographs performed earlier today at 2:00 a.m.  FINDINGS: Patchy left lower lobe airspace opacities are concerning for  pneumonia. Minimal airspace opacities are seen within the left upper lobe. Minimal right-sided atelectasis is noted. No pleural effusion or pneumothorax is seen. No masses are identified. No significant pleural effusion or pneumothorax is seen.  A large hiatal hernia is noted, with surrounding fat, explaining the contour visualized on chest radiograph. A small nonspecific node is noted adjacent to the hiatal hernia. Scattered coronary artery calcifications are seen. The mediastinum is otherwise unremarkable. No mediastinal lymphadenopathy is seen. No pericardial effusion is noted.  The abdomen is unremarkable in appearance. No axillary lymphadenopathy is seen. The visualized portions of the liver and spleen are unremarkable. The visualized portions of the gallbladder, pancreas, adrenal glands and kidneys are within normal limits.  No acute osseous abnormalities are identified.  IMPRESSION: 1. Patchy left lower lobe airspace opacities and minimal left upper lobe airspace opacities are compatible with pneumonia. 2. Minimal right-sided atelectasis noted. 3. Large hiatal hernia noted, explaining the contour visualized on chest radiograph. No mass seen. 4. Scattered coronary artery  calcifications seen.   Electronically Signed   By: Roanna Raider M.D.   On: 10/20/2013 06:19   Assessment/Plan   Dislocation of hip prosthesis -s/p Closed Reduction today per Dr.Yates -weight bearing restrictions per ORtho    CAP (community acquired pneumonia) -asymptomatic -empiric Rocephin/Zithromax -could be possibly aspiration induced too -FU Urine legionella/pneumo Ag      Chronic Hip pain -continue home reg      Anxiety -continue xanax    HTN -continue lopressor    GERD -continue PPI  Code Status: DNR DVT Prophylaxis: lovenox Family Communication: none at bedside Disposition Plan: home tomorrow if stable  Time spent:  [facility] Pager [phone]  **Disclaimer: This note may have been dictated with voice recognition software. Similar sounding words can inadvertently be transcribed and this note may contain transcription errors which may not have been corrected upon publication of note.**

## 2013-10-20 NOTE — Anesthesia Preprocedure Evaluation (Addendum)
Anesthesia Evaluation  Patient identified by MRN, date of birth, ID band Patient awake    Reviewed: Allergy & Precautions, H&P , NPO status , Patient's Chart, lab work & pertinent test results, reviewed documented beta blocker date and time   Airway Mallampati: II TM Distance: <3 FB Neck ROM: Full    Dental  (+) Missing   Pulmonary  Denies smoking cigarettes.  Has a loose cough from upper airway/pharynx?  Lips crusty dry.         Cardiovascular hypertension, Pt. on home beta blockers and Pt. on medications  Takes Lopressor in a.m., not today.   Neuro/Psych    GI/Hepatic hiatal hernia, GERD-  Medicated and Controlled,  Endo/Other    Renal/GU      Musculoskeletal   Abdominal   Peds  Hematology   Anesthesia Other Findings Pt took Vicodin at home... Pt's speech is a bit garbled, hard to understand. Full Beard and mustache. Original assessment at 0655  Reproductive/Obstetrics                         Anesthesia Physical Anesthesia Plan  ASA: III  Anesthesia Plan: General   Post-op Pain Management:    Induction: Intravenous, Rapid sequence and Cricoid pressure planned  Airway Management Planned: Oral ETT  Additional Equipment:   Intra-op Plan:   Post-operative Plan: Extubation in OR  Informed Consent: I have reviewed the patients History and Physical, chart, labs and discussed the procedure including the risks, benefits and alternatives for the proposed anesthesia with the patient or authorized representative who has indicated his/her understanding and acceptance.   Dental advisory given  Plan Discussed with: CRNA, Anesthesiologist and Surgeon  Anesthesia Plan Comments:         Anesthesia Quick Evaluation

## 2013-10-20 NOTE — Transfer of Care (Signed)
Immediate Anesthesia Transfer of Care Note  Patient: Todd HightJohn D [name]  Procedure(s) Performed: Procedure(s): CLOSED REDUCTION HIP (Right)  Patient Location: PACU  Anesthesia Type:General  Level of Consciousness: awake, sedated and patient cooperative  Airway & Oxygen Therapy: Patient Spontanous Breathing and Patient connected to nasal cannula oxygen  Post-op Assessment: Report given to PACU RN, Post -op Vital signs reviewed and stable and Patient moving all extremities  Post vital signs: Reviewed and stable  Complications: No apparent anesthesia complications

## 2013-10-20 NOTE — Brief Op Note (Signed)
10/20/2013  7:20 AM  PATIENT:  Todd [name]  43 y.o. male  PRE-OPERATIVE DIAGNOSIS:  Dislocated right hip  POST-OPERATIVE DIAGNOSIS:  Dislocated right hip  PROCEDURE:  Procedure(s): CLOSED REDUCTION HIP (Right)  SURGEON:  Surgeon(s) and Role:    * Eldred MangesMark C Kashus Karlen, MD - Primary  PHYSICIAN ASSISTANT:   ASSISTANTS: none   ANESTHESIA:   general  EBL:     BLOOD ADMINISTERED:none  DRAINS: none   LOCAL MEDICATIONS USED:  NONE  SPECIMEN:  No Specimen  DISPOSITION OF SPECIMEN:  N/A  COUNTS:  YES  TOURNIQUET:  * No tourniquets in log *  DICTATION: .Other Dictation: Dictation Number [id number]  PLAN OF CARE: Admit to inpatient   On hospitalist service for Tx of pneumonia   PATIENT DISPOSITION:  PACU - hemodynamically stable.   Delay start of Pharmacological VTE agent (>24hrs) due to surgical blood loss or risk of bleeding: not applicable

## 2013-10-20 NOTE — Anesthesia Procedure Notes (Signed)
Procedure Name: Intubation Date/Time: 10/20/2013 7:13 AM Performed by: Darcey NoraJAMES, Shanae Luo B Pre-anesthesia Checklist: Patient identified, Emergency Drugs available, Suction available and Patient being monitored Patient Re-evaluated:Patient Re-evaluated prior to inductionOxygen Delivery Method: Circle system utilized Preoxygenation: Pre-oxygenation with 100% oxygen Intubation Type: IV induction, Cricoid Pressure applied and Rapid sequence Laryngoscope Size: Mac and 4 Grade View: Grade II Tube type: Oral Tube size: 8.0 mm Number of attempts: 1 Airway Equipment and Method: Stylet Placement Confirmation: positive ETCO2,  breath sounds checked- equal and bilateral and ETT inserted through vocal cords under direct vision Secured at: 21 (cm at teeth) cm Tube secured with: Tape Dental Injury: Teeth and Oropharynx as per pre-operative assessment

## 2013-10-21 DIAGNOSIS — Z5189 Encounter for other specified aftercare: Secondary | ICD-10-CM

## 2013-10-21 LAB — CBC
HCT: 24.8 % — ABNORMAL LOW (ref 39.0–52.0)
Hemoglobin: 7.8 g/dL — ABNORMAL LOW (ref 13.0–17.0)
MCH: 25.2 pg — ABNORMAL LOW (ref 26.0–34.0)
MCHC: 31.5 g/dL (ref 30.0–36.0)
MCV: 80.3 fL (ref 78.0–100.0)
Platelets: 259 10*3/uL (ref 150–400)
RBC: 3.09 MIL/uL — ABNORMAL LOW (ref 4.22–5.81)
RDW: 16.8 % — ABNORMAL HIGH (ref 11.5–15.5)
WBC: 6.6 10*3/uL (ref 4.0–10.5)

## 2013-10-21 LAB — BASIC METABOLIC PANEL
Anion gap: 11 (ref 5–15)
BUN: 12 mg/dL (ref 6–23)
CO2: 24 mEq/L (ref 19–32)
Calcium: 8.5 mg/dL (ref 8.4–10.5)
Chloride: 106 mEq/L (ref 96–112)
Creatinine, Ser: 0.97 mg/dL (ref 0.50–1.35)
GFR calc Af Amer: >90 mL/min (ref >90)
GFR calc non Af Amer: >90 mL/min (ref >90)
Glucose, Bld: 110 mg/dL — ABNORMAL HIGH (ref 70–99)
Potassium: 4.1 mEq/L (ref 3.7–5.3)
Sodium: 141 mEq/L (ref 137–147)

## 2013-10-21 LAB — LEGIONELLA ANTIGEN, URINE: Legionella Antigen, Urine: NEGATIVE

## 2013-10-21 MED ORDER — LEVOFLOXACIN 750 MG PO TABS
750.0000 mg | ORAL_TABLET | Freq: Every day | ORAL | Status: DC
  Administered 2013-10-21: 750 mg via ORAL
  Filled 2013-10-21: qty 1

## 2013-10-21 MED ORDER — LEVOFLOXACIN 750 MG PO TABS: 750.0000 mg | ORAL_TABLET | Freq: Every day | ORAL | Status: DC

## 2013-10-21 NOTE — Discharge Summary (Signed)
Physician Discharge Summary  Todd [name] DOB: 09/25/1970 DOA: 10/20/2013  PCP: Cala BradfordWHITE,CYNTHIA S, MD  Admit date: 10/20/2013 Discharge date: 10/21/2013  Time spent: >45 minutes   Discharge Diagnoses:  Principal Problem:   Dislocation of hip prosthesis Active Problems:   Unspecified essential hypertension   Altered mental status   CAP (community acquired pneumonia)   Discharge Condition: stable  Diet recommendation: heart healthy  Filed Weights   10/20/13 0124 10/20/13 0959 10/21/13 0520  Weight: 118.389 kg (261 lb) 117.935 kg (260 lb) 116.075 kg (255 lb 14.4 oz)    History of present illness:  Todd HightJohn D Rohrbach is a 43 y.o. male with PMh of HTN, GERD, Dyslipidemia, h/o hip replacement and multiple prior dislocations presents to the ER after noticing R hip pain and a popping sensation in his R hip after attempting to walk his dog yesterday. He also has chronic R hip pain and takes 6 vicodin a day for this.  When he presented to the ER her was drowsy and lethargic, mentation subsequently improved, was taken to the OR per ORtho for Closed reduction of his Hip.  He was also noted to have patchy LLL airspace opacities compatible with pneumonia.  Hospital Course:  Dislocation of hip prosthesis - apparently has occurred many times- closed reduction was done under anaesthesia and he has been able to ambulated today without difficulty.   CAP - will transition to oral Levaquin today and continue for a 7 day course- pt admits to coughing up brown sputum but has no complaints of chest pain or shortness of breath.  - pulse ox on room air is stable at 96%   Procedures: 8/14 Closed reduction under general anesthesia  Consultations:  Ortho- Dr Annell GreeningMark Yates  Discharge Exam: Filed Vitals:   10/21/13 1018  BP: 125/67  Pulse:   Temp:   Resp:     General: *AAO x 3, no distress Cardiovascular: RRR, no murmurs Respiratory: CTA b/l   Discharge Instructions You were cared  for by a hospitalist during your hospital stay. If you have any questions about your discharge medications or the care you received while you were in the hospital after you are discharged, you can call the unit and asked to speak with the hospitalist on call if the hospitalist that took care of you is not available. Once you are discharged, your primary care physician will handle any further medical issues. Please note that NO REFILLS for any discharge medications will be authorized once you are discharged, as it is imperative that you return to your primary care physician (or establish a relationship with a primary care physician if you do not have one) for your aftercare needs so that they can reassess your need for medications and monitor your lab values.      Discharge Instructions   Diet - low sodium heart healthy    Complete by:  As directed      Increase activity slowly    Complete by:  As directed             Medication List         ALPRAZolam 1 MG tablet  Commonly known as:  XANAX  Take 1 mg by mouth at bedtime as needed for anxiety. *may repeat in 3-4 hrs if needed*     atorvastatin 40 MG tablet  Commonly known as:  LIPITOR  Take 40 mg by mouth daily.     HEMOCYTE-PLUS PO  Take 1 capsule by mouth daily.  HYDROcodone-acetaminophen 10-325 MG per tablet  Commonly known as:  NORCO  Take 1 tablet by mouth every 6 (six) hours as needed. For pain     levofloxacin 750 MG tablet  Commonly known as:  LEVAQUIN  Take 1 tablet (750 mg total) by mouth daily.     loratadine 10 MG tablet  Commonly known as:  CLARITIN  Take 10 mg by mouth daily as needed for allergies.     metoprolol 50 MG tablet  Commonly known as:  LOPRESSOR  Take 50 mg by mouth 2 (two) times daily.     pantoprazole 40 MG tablet  Commonly known as:  PROTONIX  Take 40 mg by mouth 2 (two) times daily.       Allergies  Allergen Reactions  . Ambien [Zolpidem Tartrate]     sedation  . Clonidine  Derivatives     Caused Trudie Buckler syndrome  . Nu-Iron [Polysaccharide Iron Complex] Diarrhea   Follow-up Information   Follow up with YATES,MARK C, MD In 2 weeks.   Specialty:  Orthopedic Surgery   Contact information:   [address] [place] [address] [phone]        The results of significant diagnostics from this hospitalization (including imaging, microbiology, ancillary and laboratory) are listed below for reference.    Significant Diagnostic Studies: Dg Chest 1 View  10/20/2013   CLINICAL DATA:  Right-sided hip pain.  Shortness of breath.  EXAM: CHEST - 1 VIEW  COMPARISON:  Chest x-ray 05/04/2010.  FINDINGS: There is a large mass projecting over the lower mediastinum, new compared to the prior examination. Heart size is moderately enlarged, significantly increased compared to the prior study. Mild diffuse peribronchial cuffing. Indistinct interstitial markings. Cephalization of the pulmonary vasculature. No pleural effusions. The patient is rotated to the left on today's exam, resulting in distortion of the mediastinal contours and reduced diagnostic sensitivity and specificity for mediastinal pathology. Multiple healed right-sided rib fractures again noted.  IMPRESSION: 1. Large mediastinal mass is new compared to the prior examination. There is a possibility that this could simply represent a very large hiatal hernia, however, this is poorly evaluated on this portable chest x-ray. This could be better evaluated with a standing PA and lateral chest radiograph, or contrast-enhanced CT scan if clinically appropriate. 2. Interval development of moderate cardiomegaly, new compared to the prior examination at which time the heart was normal in size. This is associated with some cephalization of the pulmonary vasculature and slight indistinctness of the interstitial markings with some peribronchial cuffing, which could indicate some mild pulmonary edema.   Electronically Signed    By: Trudie Reed M.D.   On: 10/20/2013 02:36   Dg Hip Complete Right  10/20/2013   CLINICAL DATA:  Right-sided hip pain.  EXAM: RIGHT HIP - COMPLETE 2+ VIEW  COMPARISON:  12/17/2010.  FINDINGS: Status post right hip total arthroplasty. The femoral head is superiorly dislocated relative to the prosthetic acetabular cup. Bony pelvis appears intact. No acute displaced fracture of the visualized portions of the femurs. 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 There is also some sclerosis in the left femoral head, which may suggest areas of avascular necrosis.  IMPRESSION: 1. Superior dislocation of the right hip prosthesis. 2. 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   Electronically Signed   By: Trudie Reed M.D.   On: 10/20/2013 02:32   Dg Hip Operative Right  10/20/2013   CLINICAL DATA:  Reduction of right hip replacement dislocation  EXAM: DG OPERATIVE RIGHT HIP  TECHNIQUE: A single spot fluoroscopic AP image of the right hip is submitted.  COMPARISON:  Right hip films of 10/20/2013  FINDINGS: A single portable postreduction view shows the prosthetic femoral head now within the acetabular cup, having reduced the dislocation. No acute fracture is seen. No complicating features are noted.  IMPRESSION: Reduction of dislocation of right total hip replacement components.   Electronically Signed   By: Dwyane Dee M.D.   On: 10/20/2013 08:21   Ct Head Wo Contrast  10/20/2013   CLINICAL DATA:  Altered mental status.  Dizziness.  EXAM: CT HEAD WITHOUT CONTRAST  TECHNIQUE: Contiguous axial images were obtained from the base of the skull through the vertex without intravenous contrast.  COMPARISON:  CT of the head performed 03/29/2003  FINDINGS: There is no evidence of acute infarction, mass lesion, or intra- or extra-axial hemorrhage on CT.  Mild periventricular white matter change likely reflects small vessel ischemic microangiopathy.  The posterior fossa,  including the cerebellum, brainstem and fourth ventricle, is within normal limits. The third and lateral ventricles, and basal ganglia are unremarkable in appearance. The cerebral hemispheres are symmetric in appearance, with normal gray-white differentiation. No mass effect or midline shift is seen.  There is no evidence of fracture; visualized osseous structures are unremarkable in appearance. The orbits are within normal limits. The paranasal sinuses and mastoid air cells are well-aerated. No significant soft tissue abnormalities are seen.  IMPRESSION: 1. No acute intracranial pathology seen on CT. 2. Mild small vessel ischemic microangiopathy.   Electronically Signed   By: Roanna Raider M.D.   On: 10/20/2013 04:57   Ct Chest W Contrast  10/20/2013   CLINICAL DATA:  Congestion and shortness of breath. Abnormal findings on chest radiograph. CT recommended for further evaluation.  EXAM: CT CHEST WITH CONTRAST  TECHNIQUE: Multidetector CT imaging of the chest was performed during intravenous contrast administration.  CONTRAST:  75mL OMNIPAQUE IOHEXOL 300 MG/ML  SOLN  COMPARISON:  Chest radiographs performed earlier today at 2:00 a.m.  FINDINGS: Patchy left lower lobe airspace opacities are concerning for pneumonia. Minimal airspace opacities are seen within the left upper lobe. Minimal right-sided atelectasis is noted. No pleural effusion or pneumothorax is seen. No masses are identified. No significant pleural effusion or pneumothorax is seen.  A large hiatal hernia is noted, with surrounding fat, explaining the contour visualized on chest radiograph. A small nonspecific node is noted adjacent to the hiatal hernia. Scattered coronary artery calcifications are seen. The mediastinum is otherwise unremarkable. No mediastinal lymphadenopathy is seen. No pericardial effusion is noted.  The abdomen is unremarkable in appearance. No axillary lymphadenopathy is seen. The visualized portions of the liver and spleen are  unremarkable. The visualized portions of the gallbladder, pancreas, adrenal glands and kidneys are within normal limits.  No acute osseous abnormalities are identified.  IMPRESSION: 1. Patchy left lower lobe airspace opacities and minimal left upper lobe airspace opacities are compatible with pneumonia. 2. Minimal right-sided atelectasis noted. 3. Large hiatal hernia noted, explaining the contour visualized on chest radiograph. No mass seen. 4. Scattered coronary artery calcifications seen.   Electronically Signed   By: Roanna Raider M.D.   On: 10/20/2013 06:19    Microbiology: No results found for this or any previous visit (from the past 240 hour(s)).   Labs: Basic Metabolic Panel:  Recent Labs Lab 10/20/13 0146 10/21/13 0412  NA 141 141  K 3.8 4.1  CL 103 106  CO2 28 24  GLUCOSE 126* 110*  BUN 19 12  CREATININE 1.37* 0.97  CALCIUM 8.7 8.5   Liver Function Tests:  Recent Labs Lab 10/20/13 0401  AST 21  ALT 13  ALKPHOS 99  BILITOT 0.4  PROT 6.8  ALBUMIN 3.7   No results found for this basename: LIPASE, AMYLASE,  in the last 168 hours  Recent Labs Lab 10/20/13 0401  AMMONIA 29   CBC:  Recent Labs Lab 10/20/13 0146 10/21/13 0412  WBC 11.3* 6.6  NEUTROABS 9.3*  --   HGB 9.3* 7.8*  HCT 30.4* 24.8*  MCV 82.2 80.3  PLT 316 259   Cardiac Enzymes: No results found for this basename: CKTOTAL, CKMB, CKMBINDEX, TROPONINI,  in the last 168 hours BNP: BNP (last 3 results)  Recent Labs  10/20/13 0146  PROBNP 119.6   CBG: No results found for this basename: GLUCAP,  in the last 168 hours     Signed:  Calvert Cantor, MD [facility] 10/21/2013, 4:40 PM

## 2013-10-21 NOTE — Op Note (Signed)
NAME:  Todd [name], Todd [name]                 ACCOUNT NO.:  [id number]  MEDICAL RECORD NO.:  [id number]  LOCATION:  [facility]                         FACILITY:  [facility]  PHYSICIAN:  Etoile Looman C. Ophelia CharterYates, M.D.    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  PROCEDURE:  Closed reduction under general anesthesia.  SURGEON:  Annell GreeningMark Jannely Henthorn, MD.  ANESTHESIA:  General endotracheal intubation.  COMPLICATIONS:  None.  BRIEF HISTORY:  A 43 year old male who takes Vicodin 10 four times a day, Xanax 1 mg 3-4 times a day,  was brought in by EMS after his wife came home, found him in bed, unable to ambulate with hip pain.  He had taken a lot of pain medication and Xanax and was incoherent, cannot give a history of how he dislocated his hip, has rhonchi, cough, wheezes. Chest x-ray shows pneumonia, and there was some concern for aspiration pneumonia.  The patient has had at least three dislocations.  Originally procedure was done by Dr. Turner Danielsowan.  He had long S-ROM prosthesis with femoral cables present and had been 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  The patient states he does not drink he told the ER physicians.  However, he also stated that he was planning on going downtown to drink with his friends.  Operative consent was obtained with his wife by phone with the medical assistant due to the patient's confusion and altered mental status.  DESCRIPTION OF PROCEDURE:  After induction of general anesthesia succinylcholine was given, the patient was intubated and with the patient still on the transport bed 90:90 position,  counter traction applied.  Hip was popped in easily with minimal clunk.  Leg lengths were equal.  Good range of motion and single x-ray confirmed the hip was reduced.  The patient was placed in a knee immobilizer.  Will be admitted to the Hospitalist Service for continued treatment of his pneumonia.  He had already gotten his Rocephin IV.     Jese Comella C. Ophelia CharterYates, M.D.     MCY/MEDQ  D:  10/20/2013  T:  10/20/2013  Job:  [id number]

## 2013-10-21 NOTE — Discharge Instructions (Signed)
Please have hemoglobin checked next wk.  Altered Mental Status Altered mental status most often refers to an abnormal change in your responsiveness and awareness. It can affect your speech, thought, mobility, memory, attention span, or alertness. It can range from slight confusion to complete unresponsiveness (coma). Altered mental status can be a sign of a serious underlying medical condition. Rapid evaluation and medical treatment is necessary for patients having an altered mental status. CAUSES   Low blood sugar (hypoglycemia) or diabetes.  Severe loss of body fluids (dehydration) or a body salt (electrolyte) imbalance.  A stroke or other neurologic problem, such as dementia or delirium.  A head injury or tumor.  A drug or alcohol overdose.  Exposure to toxins or poisons.  Depression, anxiety, and stress.  A low oxygen level (hypoxia).  An infection.  Blood loss.  Twitching or shaking (seizure).  Heart problems, such as heart attack or heart rhythm problems (arrhythmias).  A body temperature that is too low or too high (hypothermia or hyperthermia). DIAGNOSIS  A diagnosis is based on your history, symptoms, physical and neurologic examinations, and diagnostic tests. Diagnostic tests may include:  Measurement of your blood pressure, pulse, breathing, and oxygen levels (vital signs).  Blood tests.  Urine tests.  X-ray exams.  A computerized magnetic scan (magnetic resonance imaging, MRI).  A computerized X-ray scan (computed tomography, CT scan). TREATMENT  Treatment will depend on the cause. Treatment may include:  Management of an underlying medical or mental health condition.  Critical care or support in the hospital. HOME CARE INSTRUCTIONS   Only take over-the-counter or prescription medicines for pain, discomfort, or fever as directed by your caregiver.  Manage underlying conditions as directed by your caregiver.  Eat a healthy, well-balanced diet to  maintain strength.  Join a support group or prevention program to cope with the condition or trauma that caused the altered mental status. Ask your caregiver to help choose a program that works for you.  Follow up with your caregiver for further examination, therapy, or testing as directed. SEEK MEDICAL CARE IF:   You feel unwell or have chills.  You or your family notice a change in your behavior or your alertness.  You have trouble following your caregiver's treatment plan.  You have questions or concerns. SEEK IMMEDIATE MEDICAL CARE IF:   You have a rapid heartbeat or have chest pain.  You have difficulty breathing.  You have a fever.  You have a headache with a stiff neck.  You cough up blood.  You have blood in your urine or stool.  You have severe agitation or confusion. MAKE SURE YOU:   Understand these instructions.  Will watch your condition.  Will get help right away if you are not doing well or get worse. Document Released: 08/13/2009 Document Revised: 05/18/2011 Document Reviewed: 08/13/2009 [facility] Patient Information 2015 [employer] [place]. This information is not intended to replace advice given to you by your health care provider. Make sure you discuss any questions you have with your health care provider.

## 2013-10-23 ENCOUNTER — Encounter (HOSPITAL_COMMUNITY): Payer: Self-pay | Admitting: Orthopaedic Surgery

## 2013-10-26 DIAGNOSIS — F411 Generalized anxiety disorder: Secondary | ICD-10-CM | POA: Diagnosis not present

## 2013-10-26 DIAGNOSIS — M25559 Pain in unspecified hip: Secondary | ICD-10-CM | POA: Diagnosis not present

## 2013-10-26 DIAGNOSIS — R21 Rash and other nonspecific skin eruption: Secondary | ICD-10-CM | POA: Diagnosis not present

## 2013-10-26 DIAGNOSIS — D509 Iron deficiency anemia, unspecified: Secondary | ICD-10-CM | POA: Diagnosis not present

## 2013-10-26 DIAGNOSIS — K922 Gastrointestinal hemorrhage, unspecified: Secondary | ICD-10-CM | POA: Diagnosis not present

## 2014-01-02 DIAGNOSIS — K219 Gastro-esophageal reflux disease without esophagitis: Secondary | ICD-10-CM | POA: Diagnosis not present

## 2014-01-02 DIAGNOSIS — I10 Essential (primary) hypertension: Secondary | ICD-10-CM | POA: Diagnosis not present

## 2014-01-02 DIAGNOSIS — D509 Iron deficiency anemia, unspecified: Secondary | ICD-10-CM | POA: Diagnosis not present

## 2014-01-02 DIAGNOSIS — G894 Chronic pain syndrome: Secondary | ICD-10-CM | POA: Diagnosis not present

## 2014-01-02 DIAGNOSIS — F3342 Major depressive disorder, recurrent, in full remission: Secondary | ICD-10-CM | POA: Diagnosis not present

## 2014-01-02 DIAGNOSIS — M25551 Pain in right hip: Secondary | ICD-10-CM | POA: Diagnosis not present

## 2014-01-02 DIAGNOSIS — E785 Hyperlipidemia, unspecified: Secondary | ICD-10-CM | POA: Diagnosis not present

## 2014-01-02 DIAGNOSIS — F419 Anxiety disorder, unspecified: Secondary | ICD-10-CM | POA: Diagnosis not present

## 2014-05-01 DIAGNOSIS — E785 Hyperlipidemia, unspecified: Secondary | ICD-10-CM | POA: Diagnosis not present

## 2014-05-01 DIAGNOSIS — G894 Chronic pain syndrome: Secondary | ICD-10-CM | POA: Diagnosis not present

## 2014-05-01 DIAGNOSIS — M25551 Pain in right hip: Secondary | ICD-10-CM | POA: Diagnosis not present

## 2014-05-01 DIAGNOSIS — F419 Anxiety disorder, unspecified: Secondary | ICD-10-CM | POA: Diagnosis not present

## 2014-05-01 DIAGNOSIS — K219 Gastro-esophageal reflux disease without esophagitis: Secondary | ICD-10-CM | POA: Diagnosis not present

## 2014-05-01 DIAGNOSIS — I10 Essential (primary) hypertension: Secondary | ICD-10-CM | POA: Diagnosis not present

## 2014-05-01 DIAGNOSIS — F3342 Major depressive disorder, recurrent, in full remission: Secondary | ICD-10-CM | POA: Diagnosis not present

## 2014-06-25 ENCOUNTER — Emergency Department (HOSPITAL_COMMUNITY): Payer: Medicare Other

## 2014-06-25 ENCOUNTER — Encounter (HOSPITAL_COMMUNITY): Payer: Self-pay | Admitting: Emergency Medicine

## 2014-06-25 ENCOUNTER — Emergency Department (HOSPITAL_COMMUNITY)
Admission: EM | Admit: 2014-06-25 | Discharge: 2014-06-25 | Discharge status: Against Medical Advice | Payer: Medicare Other | Attending: Emergency Medicine | Admitting: Emergency Medicine

## 2014-06-25 DIAGNOSIS — S29092A Other injury of muscle and tendon of back wall of thorax, initial encounter: Secondary | ICD-10-CM | POA: Insufficient documentation

## 2014-06-25 DIAGNOSIS — M542 Cervicalgia: Secondary | ICD-10-CM | POA: Diagnosis not present

## 2014-06-25 DIAGNOSIS — I10 Essential (primary) hypertension: Secondary | ICD-10-CM | POA: Diagnosis not present

## 2014-06-25 DIAGNOSIS — Y92410 Unspecified street and highway as the place of occurrence of the external cause: Secondary | ICD-10-CM | POA: Diagnosis not present

## 2014-06-25 DIAGNOSIS — Z792 Long term (current) use of antibiotics: Secondary | ICD-10-CM | POA: Diagnosis not present

## 2014-06-25 DIAGNOSIS — Y9389 Activity, other specified: Secondary | ICD-10-CM | POA: Diagnosis not present

## 2014-06-25 DIAGNOSIS — S3992XA Unspecified injury of lower back, initial encounter: Secondary | ICD-10-CM | POA: Diagnosis present

## 2014-06-25 DIAGNOSIS — Z79899 Other long term (current) drug therapy: Secondary | ICD-10-CM | POA: Diagnosis not present

## 2014-06-25 DIAGNOSIS — K219 Gastro-esophageal reflux disease without esophagitis: Secondary | ICD-10-CM | POA: Diagnosis not present

## 2014-06-25 DIAGNOSIS — Y998 Other external cause status: Secondary | ICD-10-CM | POA: Insufficient documentation

## 2014-06-25 DIAGNOSIS — D649 Anemia, unspecified: Secondary | ICD-10-CM | POA: Insufficient documentation

## 2014-06-25 DIAGNOSIS — S299XXA Unspecified injury of thorax, initial encounter: Secondary | ICD-10-CM | POA: Diagnosis not present

## 2014-06-25 DIAGNOSIS — M546 Pain in thoracic spine: Secondary | ICD-10-CM | POA: Diagnosis not present

## 2014-06-25 DIAGNOSIS — M545 Low back pain: Secondary | ICD-10-CM | POA: Diagnosis not present

## 2014-06-25 DIAGNOSIS — S0990XA Unspecified injury of head, initial encounter: Secondary | ICD-10-CM | POA: Diagnosis not present

## 2014-06-25 DIAGNOSIS — G8929 Other chronic pain: Secondary | ICD-10-CM | POA: Insufficient documentation

## 2014-06-25 DIAGNOSIS — T148 Other injury of unspecified body region: Secondary | ICD-10-CM | POA: Diagnosis not present

## 2014-06-25 DIAGNOSIS — S199XXA Unspecified injury of neck, initial encounter: Secondary | ICD-10-CM | POA: Diagnosis not present

## 2014-06-25 LAB — I-STAT CHEM 8, ED
BUN: 12 mg/dL (ref 6–23)
Calcium, Ion: 1.15 mmol/L (ref 1.12–1.23)
Chloride: 102 mmol/L (ref 96–112)
Creatinine, Ser: 1.2 mg/dL (ref 0.50–1.35)
Glucose, Bld: 121 mg/dL — ABNORMAL HIGH (ref 70–99)
HCT: 26 % — ABNORMAL LOW (ref 39.0–52.0)
Hemoglobin: 8.8 g/dL — ABNORMAL LOW (ref 13.0–17.0)
Potassium: 4.1 mmol/L (ref 3.5–5.1)
Sodium: 138 mmol/L (ref 135–145)
TCO2: 23 mmol/L (ref 0–100)

## 2014-06-25 LAB — RAPID URINE DRUG SCREEN, HOSP PERFORMED
Amphetamines: NOT DETECTED
Barbiturates: NOT DETECTED
Benzodiazepines: POSITIVE — AB
Cocaine: NOT DETECTED
Opiates: POSITIVE — AB
Tetrahydrocannabinol: POSITIVE — AB

## 2014-06-25 LAB — URINALYSIS, ROUTINE W REFLEX MICROSCOPIC
Bilirubin Urine: NEGATIVE
Glucose, UA: NEGATIVE mg/dL
Hgb urine dipstick: NEGATIVE
Ketones, ur: NEGATIVE mg/dL
Leukocytes, UA: NEGATIVE
Nitrite: NEGATIVE
Protein, ur: NEGATIVE mg/dL
Specific Gravity, Urine: 1.014 (ref 1.005–1.030)
Urobilinogen, UA: 0.2 mg/dL (ref 0.0–1.0)
pH: 8 (ref 5.0–8.0)

## 2014-06-25 LAB — CBC WITH DIFFERENTIAL/PLATELET
Basophils Absolute: 0 10*3/uL (ref 0.0–0.1)
Basophils Relative: 0 % (ref 0–1)
Eosinophils Absolute: 0.1 10*3/uL (ref 0.0–0.7)
Eosinophils Relative: 1 % (ref 0–5)
HCT: 24.3 % — ABNORMAL LOW (ref 39.0–52.0)
Hemoglobin: 7.3 g/dL — ABNORMAL LOW (ref 13.0–17.0)
Lymphocytes Relative: 4 % — ABNORMAL LOW (ref 12–46)
Lymphs Abs: 0.4 10*3/uL — ABNORMAL LOW (ref 0.7–4.0)
MCH: 22.1 pg — ABNORMAL LOW (ref 26.0–34.0)
MCHC: 30 g/dL (ref 30.0–36.0)
MCV: 73.4 fL — ABNORMAL LOW (ref 78.0–100.0)
Monocytes Absolute: 0.6 10*3/uL (ref 0.1–1.0)
Monocytes Relative: 6 % (ref 3–12)
Neutro Abs: 8.8 10*3/uL — ABNORMAL HIGH (ref 1.7–7.7)
Neutrophils Relative %: 89 % — ABNORMAL HIGH (ref 43–77)
Platelets: 312 10*3/uL (ref 150–400)
RBC: 3.31 MIL/uL — ABNORMAL LOW (ref 4.22–5.81)
RDW: 18.4 % — ABNORMAL HIGH (ref 11.5–15.5)
WBC: 9.9 10*3/uL (ref 4.0–10.5)

## 2014-06-25 LAB — ETHANOL: Alcohol, Ethyl (B): <5 mg/dL (ref 0–9)

## 2014-06-25 MED ORDER — KETOROLAC TROMETHAMINE 60 MG/2ML IM SOLN
60.0000 mg | Freq: Once | INTRAMUSCULAR | Status: AC
  Administered 2014-06-25: 60 mg via INTRAMUSCULAR
  Filled 2014-06-25: qty 2

## 2014-06-25 MED ORDER — SODIUM CHLORIDE 0.9 % IV BOLUS (SEPSIS)
1000.0000 mL | INTRAVENOUS | Status: AC
  Administered 2014-06-25: 1000 mL via INTRAVENOUS

## 2014-06-25 NOTE — ED Notes (Signed)
Pt refused to sign AMA form, or receive any additional care by provider.

## 2014-06-25 NOTE — ED Notes (Signed)
Beth, NP at bedside. 
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
## 2014-06-25 NOTE — ED Notes (Signed)
Pt keys, ticket, and necklace found in room.  Per PA, pt belongings bagged and placed at secretary station to be picked up by family.

## 2014-06-25 NOTE — ED Notes (Addendum)
9 $1 10 th anniversary cash [place] education lottery scratch off tickets 5 $2  10 th anniversary riches [place] education lottery scratch off tickets 4 $5 10 th anniversary spectacular [place] education lottery scratch off tickets 1 $10 anniversary millions [place] education lottery scratch off ticket. Pt bears money/wallet clip with cards, ID, Social Security Card. 1 Fluoxetine bottle with pills in it 1 Quetiapine bottle with 1 pill in it. 1 Amoxicillin pill bottle empty with top missing. Set of car keys [employer] [place] [employer] business card 1 driving ticket 1driver exchange form. 1 cross necklace Security called and informed of pt things. Security has pt valuables.

## 2014-06-25 NOTE — ED Notes (Signed)
This RN in hallway with PA and Tanisha, NT speaking to pt next door.  Pt appeared in doorway, this RN asked "excuse me, sir?  Where are you headed to?".  Pt was fully dressed.  Pt sts "I would like to leave and go talk to my own doctor".  This RN and PA encouraged pt back in to room to discuss situation.  Pt refused to go back in to room and continued to walk towards front door.  Pt sts "I'm a Clinical research associatelawyer.  I know my rights.  I can leave if I want to".  This RN asked pt to demonstrate his IV has been removed.  Pt showed RN arm with no IV in place and dried blood marks.  This RN again requested pt to walk back to room, pt refused and headed toward lobby.  Security, Charity fundraiserN, [place] and NT followed pt to lobby.  Pt continued to walk outside and up hill outside of ER.  [employer] followed pt to parking lot.

## 2014-06-25 NOTE — ED Notes (Signed)
PA Jeff at the bedside. Patient found walking down the hallway with jacket on backwards. Stated, "My girlfriend is outside waiting on me. I have to leave." Pt walked back to the room and placed on the bed. PA went in immediately.
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
## 2014-06-25 NOTE — ED Notes (Signed)
Pt placed on 2L nasal cannula for sats at 88-90%

## 2014-06-25 NOTE — ED Notes (Signed)
Pt is requesting pain medicine before he is willing/able to complete any more of his scans.  Pt sts "it hurts too bad for me to lay still".  NP made aware.  Pain medicine to be ordered

## 2014-06-26 ENCOUNTER — Encounter (HOSPITAL_COMMUNITY): Payer: Self-pay | Admitting: Neurology

## 2014-06-26 ENCOUNTER — Emergency Department (HOSPITAL_COMMUNITY): Payer: Medicare Other

## 2014-06-26 ENCOUNTER — Emergency Department (HOSPITAL_COMMUNITY)
Admission: EM | Admit: 2014-06-26 | Discharge: 2014-06-26 | Disposition: A | Discharge status: Home / Self Care | Payer: Medicare Other | Attending: Emergency Medicine | Admitting: Emergency Medicine

## 2014-06-26 DIAGNOSIS — Y998 Other external cause status: Secondary | ICD-10-CM | POA: Insufficient documentation

## 2014-06-26 DIAGNOSIS — Y92410 Unspecified street and highway as the place of occurrence of the external cause: Secondary | ICD-10-CM | POA: Diagnosis not present

## 2014-06-26 DIAGNOSIS — Z79899 Other long term (current) drug therapy: Secondary | ICD-10-CM | POA: Diagnosis not present

## 2014-06-26 DIAGNOSIS — Y9389 Activity, other specified: Secondary | ICD-10-CM | POA: Insufficient documentation

## 2014-06-26 DIAGNOSIS — K219 Gastro-esophageal reflux disease without esophagitis: Secondary | ICD-10-CM | POA: Diagnosis not present

## 2014-06-26 DIAGNOSIS — S0990XA Unspecified injury of head, initial encounter: Secondary | ICD-10-CM | POA: Diagnosis not present

## 2014-06-26 DIAGNOSIS — S32000A Wedge compression fracture of unspecified lumbar vertebra, initial encounter for closed fracture: Secondary | ICD-10-CM | POA: Diagnosis not present

## 2014-06-26 DIAGNOSIS — I10 Essential (primary) hypertension: Secondary | ICD-10-CM | POA: Diagnosis not present

## 2014-06-26 DIAGNOSIS — S32018A Other fracture of first lumbar vertebra, initial encounter for closed fracture: Secondary | ICD-10-CM | POA: Insufficient documentation

## 2014-06-26 DIAGNOSIS — S3992XA Unspecified injury of lower back, initial encounter: Secondary | ICD-10-CM | POA: Diagnosis present

## 2014-06-26 MED ORDER — CYCLOBENZAPRINE HCL 10 MG PO TABS: 10.0000 mg | ORAL_TABLET | Freq: Two times a day (BID) | ORAL | Status: DC | PRN

## 2014-06-26 MED ORDER — NAPROXEN 500 MG PO TABS: 500.0000 mg | ORAL_TABLET | Freq: Two times a day (BID) | ORAL | Status: DC

## 2014-06-26 MED ORDER — HYDROCODONE-ACETAMINOPHEN 5-325 MG PO TABS: 1.0000 | ORAL_TABLET | ORAL | Status: DC | PRN
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
## 2014-06-26 NOTE — Discharge Instructions (Signed)
Back, Compression Fracture °A compression fracture happens when a force is put upon the length of your spine. Slipping and falling on your bottom are examples of such a force. When this happens, sometimes the force is great enough to compress the building blocks (vertebral bodies) of your spine. Although this causes a lot of pain, this can usually be treated at home, unless your caregiver feels hospitalization is needed for pain control. °Your backbone (spinal column) is made up of 24 main vertebral bodies in addition to the sacrum and coccyx (see illustration). These are held together by tough fibrous tissues (ligaments) and by support of your muscles. Nerve roots pass through the openings between the vertebrae. A sudden wrenching move, injury, or a fall may cause a compression fracture of one of the vertebral bodies. This may result in back pain or spread of pain into the belly (abdomen), the buttocks, and down the leg into the foot. Pain may also be created by muscle spasm alone. °Large studies have been undertaken to determine the best possible course of action to help your back following injury and also to prevent future problems. The recommendations are as follows. °FOLLOWING A COMPRESSION FRACTURE: °Do the following only if advised by your caregiver.  °· If a back brace has been suggested or provided, wear it as directed. °· Do not stop wearing the back brace unless instructed by your caregiver. °· When allowed to return to regular activities, avoid a sedentary lifestyle. Actively exercise. Sporadic weekend binges of tennis, racquetball, or waterskiing may actually aggravate or create problems, especially if you are not in condition for that activity. °· Avoid sports requiring sudden body movements until you are in condition for them. Swimming and walking are safer activities. °· Maintain good posture. °· Avoid obesity. °· If not already done, you should have a DEXA scan. Based on the results, be treated for  osteoporosis. °FOLLOWING ACUTE (SUDDEN) INJURY: °· Only take over-the-counter or prescription medicines for pain, discomfort, or fever as directed by your caregiver. °· Use bed rest for only the most extreme acute episode. Prolonged bed rest may aggravate your condition. Ice used for acute conditions is effective. Use a large plastic bag filled with ice. Wrap it in a towel. This also provides excellent pain relief. This may be continuous. Or use it for 30 minutes every 2 hours during acute phase, then as needed. Heat for 30 minutes prior to activities is helpful. °· As soon as the acute phase (the time when your back is too painful for you to do normal activities) is over, it is important to resume normal activities and work hardening programs. Back injuries can cause potentially marked changes in lifestyle. So it is important to attack these problems aggressively. °· See your caregiver for continued problems. He or she can help or refer you for appropriate exercises, physical therapy, and work hardening if needed. °· If you are given narcotic medications for your condition, for the next 24 hours do not: °¨ Drive. °¨ Operate machinery or power tools. °¨ Sign legal documents. °· Do not drink alcohol, or take sleeping pills or other medications that may interfere with treatment. °If your caregiver has given you a follow-up appointment, it is very important to keep that appointment. Not keeping the appointment could result in a chronic or permanent injury, pain, and disability. If there is any problem keeping the appointment, you must call back to this facility for assistance.  °SEEK IMMEDIATE MEDICAL CARE IF: °· You develop numbness,   tingling, weakness, or problems with the use of your arms or legs. °· You develop severe back pain not relieved with medications. °· You have changes in bowel or bladder control. °· You have increasing pain in any areas of the body. °Document Released: 02/23/2005 Document Revised:  07/10/2013 Document Reviewed: 09/28/2007 °ExitCare® Patient Information ©2015 [employer]. This information is not intended to replace advice given to you by your health care provider. Make sure you discuss any questions you have with your health care provider. ° °
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
## 2014-06-28 ENCOUNTER — Encounter (HOSPITAL_COMMUNITY): Payer: Self-pay | Admitting: *Deleted

## 2014-06-28 ENCOUNTER — Emergency Department (HOSPITAL_COMMUNITY)
Admission: EM | Admit: 2014-06-28 | Discharge: 2014-06-28 | Disposition: A | Discharge status: Home / Self Care | Payer: Medicare Other | Attending: Emergency Medicine | Admitting: Emergency Medicine

## 2014-06-28 DIAGNOSIS — M549 Dorsalgia, unspecified: Secondary | ICD-10-CM | POA: Insufficient documentation

## 2014-06-28 DIAGNOSIS — Z96649 Presence of unspecified artificial hip joint: Secondary | ICD-10-CM | POA: Diagnosis not present

## 2014-06-28 DIAGNOSIS — G894 Chronic pain syndrome: Secondary | ICD-10-CM | POA: Diagnosis not present

## 2014-06-28 DIAGNOSIS — Z9889 Other specified postprocedural states: Secondary | ICD-10-CM | POA: Diagnosis not present

## 2014-06-28 DIAGNOSIS — R7989 Other specified abnormal findings of blood chemistry: Secondary | ICD-10-CM | POA: Diagnosis present

## 2014-06-28 DIAGNOSIS — Z79899 Other long term (current) drug therapy: Secondary | ICD-10-CM | POA: Diagnosis not present

## 2014-06-28 DIAGNOSIS — D649 Anemia, unspecified: Secondary | ICD-10-CM | POA: Diagnosis not present

## 2014-06-28 DIAGNOSIS — K219 Gastro-esophageal reflux disease without esophagitis: Secondary | ICD-10-CM | POA: Diagnosis not present

## 2014-06-28 DIAGNOSIS — D509 Iron deficiency anemia, unspecified: Secondary | ICD-10-CM | POA: Diagnosis not present

## 2014-06-28 DIAGNOSIS — M25551 Pain in right hip: Secondary | ICD-10-CM | POA: Diagnosis not present

## 2014-06-28 DIAGNOSIS — R109 Unspecified abdominal pain: Secondary | ICD-10-CM | POA: Diagnosis not present

## 2014-06-28 DIAGNOSIS — I10 Essential (primary) hypertension: Secondary | ICD-10-CM | POA: Insufficient documentation

## 2014-06-28 DIAGNOSIS — S32000A Wedge compression fracture of unspecified lumbar vertebra, initial encounter for closed fracture: Secondary | ICD-10-CM | POA: Diagnosis not present

## 2014-06-28 LAB — CBC
HCT: 24.1 % — ABNORMAL LOW (ref 39.0–52.0)
Hemoglobin: 7 g/dL — ABNORMAL LOW (ref 13.0–17.0)
MCH: 21.1 pg — ABNORMAL LOW (ref 26.0–34.0)
MCHC: 29 g/dL — ABNORMAL LOW (ref 30.0–36.0)
MCV: 72.6 fL — ABNORMAL LOW (ref 78.0–100.0)
Platelets: 418 10*3/uL — ABNORMAL HIGH (ref 150–400)
RBC: 3.32 MIL/uL — ABNORMAL LOW (ref 4.22–5.81)
RDW: 18.1 % — ABNORMAL HIGH (ref 11.5–15.5)
WBC: 5.4 10*3/uL (ref 4.0–10.5)

## 2014-06-28 LAB — COMPREHENSIVE METABOLIC PANEL
ALT: 21 U/L (ref 0–53)
AST: 24 U/L (ref 0–37)
Albumin: 3.8 g/dL (ref 3.5–5.2)
Alkaline Phosphatase: 107 U/L (ref 39–117)
Anion gap: 11 (ref 5–15)
BUN: 18 mg/dL (ref 6–23)
CO2: 23 mmol/L (ref 19–32)
Calcium: 8.8 mg/dL (ref 8.4–10.5)
Chloride: 105 mmol/L (ref 96–112)
Creatinine, Ser: 1.08 mg/dL (ref 0.50–1.35)
GFR calc Af Amer: >90 mL/min (ref >90)
GFR calc non Af Amer: 82 mL/min — ABNORMAL LOW (ref >90)
Glucose, Bld: 139 mg/dL — ABNORMAL HIGH (ref 70–99)
Potassium: 4 mmol/L (ref 3.5–5.1)
Sodium: 139 mmol/L (ref 135–145)
Total Bilirubin: 0.6 mg/dL (ref 0.3–1.2)
Total Protein: 7.1 g/dL (ref 6.0–8.3)

## 2014-06-28 LAB — TYPE AND SCREEN
ABO/RH(D): A POS
Antibody Screen: POSITIVE
DAT, IgG: NEGATIVE
PT AG Type: NEGATIVE

## 2014-06-28 LAB — RETICULOCYTES
RBC.: 3.03 MIL/uL — ABNORMAL LOW (ref 4.22–5.81)
Retic Count, Absolute: 48.5 10*3/uL (ref 19.0–186.0)
Retic Ct Pct: 1.6 % (ref 0.4–3.1)

## 2014-06-28 LAB — POC OCCULT BLOOD, ED: Fecal Occult Bld: NEGATIVE

## 2014-06-28 MED ORDER — OXYCODONE-ACETAMINOPHEN 5-325 MG PO TABS
2.0000 | ORAL_TABLET | Freq: Once | ORAL | Status: AC
  Administered 2014-06-28: 2 via ORAL
  Filled 2014-06-28: qty 2

## 2014-06-28 NOTE — ED Notes (Signed)
Attempted IV x2, unsuccessful.  

## 2014-06-28 NOTE — ED Notes (Signed)
Dr. Fayrene FearingJames at bedside performing rectal exam

## 2014-06-28 NOTE — ED Notes (Signed)
MD at bedside. 
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
## 2014-06-28 NOTE — Discharge Instructions (Signed)
Increase your iron to 3 times per day Contact the [facility]. [phone], for an outpatient hematology appointment Turn to ER with dizziness, weakness, lightheadedness, fainting, near fainting, chest pain, shortness of breath, other symptoms. Check with any sign of bleeding, blood in your stools, vomiting blood, blood in your urine.  Iron Deficiency Anemia Anemia is a condition in which there are less red blood cells or hemoglobin in the blood than normal. Hemoglobin is the part of red blood cells that carries oxygen. Iron deficiency anemia is anemia caused by too little iron. It is the most common type of anemia. It may leave you tired and short of breath. CAUSES   Lack of iron in the diet.  Poor absorption of iron, as seen with intestinal disorders.  Intestinal bleeding.  Heavy periods. SIGNS AND SYMPTOMS  Mild anemia may not be noticeable. Symptoms may include:  Fatigue.  Headache.  Pale skin.  Weakness.  Tiredness.  Shortness of breath.  Dizziness.  Cold hands and feet.  Fast or irregular heartbeat. DIAGNOSIS  Diagnosis requires a thorough evaluation and physical exam by your health care provider. Blood tests are generally used to confirm iron deficiency anemia. Additional tests may be done to find the underlying cause of your anemia. These may include:  Testing for blood in the stool (fecal occult blood test).  A procedure to see inside the colon and rectum (colonoscopy).  A procedure to see inside the esophagus and stomach (endoscopy). TREATMENT  Iron deficiency anemia is treated by correcting the cause of the deficiency. Treatment may involve:  Adding iron-rich foods to your diet.  Taking iron supplements. Pregnant or breastfeeding women need to take extra iron because their normal diet usually does not provide the required amount.  Taking vitamins. Vitamin C improves the absorption of iron. Your health care provider may recommend that  you take your iron tablets with a glass of orange juice or vitamin C supplement.  Medicines to make heavy menstrual flow lighter.  Surgery. HOME CARE INSTRUCTIONS   Take iron as directed by your health care provider.  If you cannot tolerate taking iron supplements by mouth, talk to your health care provider about taking them through a vein (intravenously) or an injection into a muscle.  For the best iron absorption, iron supplements should be taken on an empty stomach. If you cannot tolerate them on an empty stomach, you may need to take them with food.  Do not drink milk or take antacids at the same time as your iron supplements. Milk and antacids may interfere with the absorption of iron.  Iron supplements can cause constipation. Make sure to include fiber in your diet to prevent constipation. A stool softener may also be recommended.  Take vitamins as directed by your health care provider.  Eat a diet rich in iron. Foods high in iron include liver, lean beef, whole-grain bread, eggs, dried fruit, and dark green leafy vegetables. SEEK IMMEDIATE MEDICAL CARE IF:   You faint. If this happens, do not drive. Call your local emergency services (911 in [place] if no other help is available.  You have chest pain.  You feel nauseous or vomit.  You have severe or increased shortness of breath with activity.  You feel weak.  You have a rapid heartbeat.  You have unexplained sweating.  You become light-headed when getting up from a chair or bed. MAKE SURE YOU:   Understand these instructions.  Will watch your condition.  Will get  help right away if you are not doing well or get worse. Document Released: 02/21/2000 Document Revised: 02/28/2013 Document Reviewed: 10/31/2012 [facility] Patient Information 2015 [place], [place]. This information is not intended to replace advice given to you by your health care provider. Make sure you discuss any questions you have with your health care  provider.

## 2014-06-29 LAB — FOLATE: Folate: >20 ng/mL

## 2014-06-29 LAB — VITAMIN B12: Vitamin B-12: 1032 pg/mL — ABNORMAL HIGH (ref 211–911)
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
## 2014-07-16 ENCOUNTER — Inpatient Hospital Stay (HOSPITAL_COMMUNITY)
Admission: EM | Admit: 2014-07-16 | Discharge: 2014-07-17 | DRG: 812 | Discharge status: Against Medical Advice | Payer: Medicare Other | Attending: Internal Medicine | Admitting: Internal Medicine

## 2014-07-16 ENCOUNTER — Encounter (HOSPITAL_COMMUNITY): Payer: Self-pay | Admitting: Cardiology

## 2014-07-16 DIAGNOSIS — Z96641 Presence of right artificial hip joint: Secondary | ICD-10-CM | POA: Diagnosis present

## 2014-07-16 DIAGNOSIS — D649 Anemia, unspecified: Principal | ICD-10-CM | POA: Diagnosis present

## 2014-07-16 DIAGNOSIS — I10 Essential (primary) hypertension: Secondary | ICD-10-CM | POA: Diagnosis present

## 2014-07-16 DIAGNOSIS — R531 Weakness: Secondary | ICD-10-CM | POA: Diagnosis not present

## 2014-07-16 DIAGNOSIS — K219 Gastro-esophageal reflux disease without esophagitis: Secondary | ICD-10-CM | POA: Diagnosis present

## 2014-07-16 DIAGNOSIS — Z79899 Other long term (current) drug therapy: Secondary | ICD-10-CM | POA: Diagnosis not present

## 2014-07-16 DIAGNOSIS — D6489 Other specified anemias: Secondary | ICD-10-CM

## 2014-07-16 LAB — CBC
HCT: 23.4 % — ABNORMAL LOW (ref 39.0–52.0)
Hemoglobin: 6.7 g/dL — CL (ref 13.0–17.0)
MCH: 21.6 pg — ABNORMAL LOW (ref 26.0–34.0)
MCHC: 28.6 g/dL — ABNORMAL LOW (ref 30.0–36.0)
MCV: 75.5 fL — ABNORMAL LOW (ref 78.0–100.0)
Platelets: 435 10*3/uL — ABNORMAL HIGH (ref 150–400)
RBC: 3.1 MIL/uL — ABNORMAL LOW (ref 4.22–5.81)
RDW: 22 % — ABNORMAL HIGH (ref 11.5–15.5)
WBC: 6.1 10*3/uL (ref 4.0–10.5)

## 2014-07-16 LAB — IRON AND TIBC
Iron: 8 ug/dL — ABNORMAL LOW (ref 45–182)
Saturation Ratios: 2 % — ABNORMAL LOW (ref 17.9–39.5)
TIBC: 392 ug/dL (ref 250–450)
UIBC: 384 ug/dL

## 2014-07-16 LAB — RETICULOCYTES
RBC.: 2.61 MIL/uL — ABNORMAL LOW (ref 4.22–5.81)
Retic Count, Absolute: 88.7 10*3/uL (ref 19.0–186.0)
Retic Ct Pct: 3.4 % — ABNORMAL HIGH (ref 0.4–3.1)

## 2014-07-16 LAB — COMPREHENSIVE METABOLIC PANEL
ALT: 13 U/L — ABNORMAL LOW (ref 17–63)
AST: 22 U/L (ref 15–41)
Albumin: 3.6 g/dL (ref 3.5–5.0)
Alkaline Phosphatase: 117 U/L (ref 38–126)
Anion gap: 7 (ref 5–15)
BUN: 11 mg/dL (ref 6–20)
CO2: 25 mmol/L (ref 22–32)
Calcium: 9 mg/dL (ref 8.9–10.3)
Chloride: 104 mmol/L (ref 101–111)
Creatinine, Ser: 1.11 mg/dL (ref 0.61–1.24)
GFR calc Af Amer: >60 mL/min (ref >60)
GFR calc non Af Amer: >60 mL/min (ref >60)
Glucose, Bld: 127 mg/dL — ABNORMAL HIGH (ref 70–99)
Potassium: 4 mmol/L (ref 3.5–5.1)
Sodium: 136 mmol/L (ref 135–145)
Total Bilirubin: 0.3 mg/dL (ref 0.3–1.2)
Total Protein: 6.7 g/dL (ref 6.5–8.1)

## 2014-07-16 LAB — PREPARE RBC (CROSSMATCH)

## 2014-07-16 LAB — FERRITIN: Ferritin: 11 ng/mL — ABNORMAL LOW (ref 24–336)

## 2014-07-16 LAB — VITAMIN B12: Vitamin B-12: 687 pg/mL (ref 180–914)

## 2014-07-16 LAB — POC OCCULT BLOOD, ED: Fecal Occult Bld: NEGATIVE

## 2014-07-16 LAB — FOLATE: Folate: 42.3 ng/mL (ref >5.9)

## 2014-07-16 MED ORDER — ALPRAZOLAM 0.5 MG PO TABS: 1.0000 mg | ORAL_TABLET | Freq: Every evening | ORAL | Status: DC | PRN

## 2014-07-16 MED ORDER — ACETAMINOPHEN 325 MG PO TABS: 650.0000 mg | ORAL_TABLET | Freq: Four times a day (QID) | ORAL | Status: DC | PRN

## 2014-07-16 MED ORDER — PANTOPRAZOLE SODIUM 40 MG PO TBEC
40.0000 mg | DELAYED_RELEASE_TABLET | Freq: Two times a day (BID) | ORAL | Status: DC
Start: 2014-07-16 — End: 2014-07-17
  Administered 2014-07-16: 40 mg via ORAL
  Filled 2014-07-16: qty 1

## 2014-07-16 MED ORDER — QUETIAPINE FUMARATE 50 MG PO TABS
50.0000 mg | ORAL_TABLET | Freq: Every day | ORAL | Status: DC
  Administered 2014-07-16: 50 mg via ORAL
  Filled 2014-07-16: qty 1

## 2014-07-16 MED ORDER — ALBUTEROL SULFATE (2.5 MG/3ML) 0.083% IN NEBU: 2.5000 mg | INHALATION_SOLUTION | RESPIRATORY_TRACT | Status: DC | PRN

## 2014-07-16 MED ORDER — ONDANSETRON HCL 4 MG/2ML IJ SOLN: 4.0000 mg | Freq: Four times a day (QID) | INTRAMUSCULAR | Status: DC | PRN

## 2014-07-16 MED ORDER — SODIUM CHLORIDE 0.9 % IV BOLUS (SEPSIS)
1000.0000 mL | Freq: Once | INTRAVENOUS | Status: AC
Start: 2014-07-16 — End: 2014-07-16
  Administered 2014-07-16: 1000 mL via INTRAVENOUS

## 2014-07-16 MED ORDER — ALUM & MAG HYDROXIDE-SIMETH 200-200-20 MG/5ML PO SUSP: 30.0000 mL | Freq: Four times a day (QID) | ORAL | Status: DC | PRN

## 2014-07-16 MED ORDER — ONDANSETRON HCL 4 MG PO TABS: 4.0000 mg | ORAL_TABLET | Freq: Four times a day (QID) | ORAL | Status: DC | PRN

## 2014-07-16 MED ORDER — SODIUM CHLORIDE 0.9 % IV SOLN
10.0000 mL/h | Freq: Once | INTRAVENOUS | Status: AC
  Administered 2014-07-16: 10 mL/h via INTRAVENOUS

## 2014-07-16 MED ORDER — HYDROCODONE-ACETAMINOPHEN 5-325 MG PO TABS
1.0000 | ORAL_TABLET | ORAL | Status: DC | PRN
  Administered 2014-07-16 – 2014-07-17 (×2): 2 via ORAL
  Filled 2014-07-16 (×2): qty 2

## 2014-07-16 MED ORDER — ACETAMINOPHEN 650 MG RE SUPP: 650.0000 mg | Freq: Four times a day (QID) | RECTAL | Status: DC | PRN

## 2014-07-16 MED ORDER — ATORVASTATIN CALCIUM 40 MG PO TABS
40.0000 mg | ORAL_TABLET | Freq: Every day | ORAL | Status: DC
  Administered 2014-07-16: 40 mg via ORAL
  Filled 2014-07-16 (×2): qty 1

## 2014-07-16 MED ORDER — QUETIAPINE FUMARATE 200 MG PO TABS
200.0000 mg | ORAL_TABLET | Freq: Every day | ORAL | Status: DC
  Administered 2014-07-16: 200 mg via ORAL
  Filled 2014-07-16 (×2): qty 1

## 2014-07-16 MED ORDER — CYCLOBENZAPRINE HCL 10 MG PO TABS: 10.0000 mg | ORAL_TABLET | Freq: Two times a day (BID) | ORAL | Status: DC | PRN

## 2014-07-16 MED ORDER — METOPROLOL TARTRATE 50 MG PO TABS
50.0000 mg | ORAL_TABLET | Freq: Two times a day (BID) | ORAL | Status: DC
Start: 2014-07-16 — End: 2014-07-17
  Administered 2014-07-16: 50 mg via ORAL
  Filled 2014-07-16 (×3): qty 1

## 2014-07-16 MED ORDER — OXYCODONE HCL 5 MG PO TABS
10.0000 mg | ORAL_TABLET | Freq: Once | ORAL | Status: AC
Start: 2014-07-16 — End: 2014-07-16
  Administered 2014-07-16: 10 mg via ORAL
  Filled 2014-07-16: qty 2
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
## 2014-07-16 NOTE — ED Notes (Signed)
Dr Hyacinth Meekermiller aware of pt hgb

## 2014-07-17 LAB — COMPREHENSIVE METABOLIC PANEL
ALT: 12 U/L — ABNORMAL LOW (ref 17–63)
AST: 18 U/L (ref 15–41)
Albumin: 3.3 g/dL — ABNORMAL LOW (ref 3.5–5.0)
Alkaline Phosphatase: 108 U/L (ref 38–126)
Anion gap: 10 (ref 5–15)
BUN: 11 mg/dL (ref 6–20)
CO2: 21 mmol/L — ABNORMAL LOW (ref 22–32)
Calcium: 8.7 mg/dL — ABNORMAL LOW (ref 8.9–10.3)
Chloride: 107 mmol/L (ref 101–111)
Creatinine, Ser: 0.97 mg/dL (ref 0.61–1.24)
GFR calc Af Amer: >60 mL/min (ref >60)
GFR calc non Af Amer: >60 mL/min (ref >60)
Glucose, Bld: 90 mg/dL (ref 70–99)
Potassium: 3.9 mmol/L (ref 3.5–5.1)
Sodium: 138 mmol/L (ref 135–145)
Total Bilirubin: 1 mg/dL (ref 0.3–1.2)
Total Protein: 6.4 g/dL — ABNORMAL LOW (ref 6.5–8.1)

## 2014-07-17 LAB — CBC
HCT: 27 % — ABNORMAL LOW (ref 39.0–52.0)
Hemoglobin: 8.3 g/dL — ABNORMAL LOW (ref 13.0–17.0)
MCH: 23.2 pg — ABNORMAL LOW (ref 26.0–34.0)
MCHC: 30.7 g/dL (ref 30.0–36.0)
MCV: 75.4 fL — ABNORMAL LOW (ref 78.0–100.0)
Platelets: 414 10*3/uL — ABNORMAL HIGH (ref 150–400)
RBC: 3.58 MIL/uL — ABNORMAL LOW (ref 4.22–5.81)
RDW: 19.7 % — ABNORMAL HIGH (ref 11.5–15.5)
WBC: 5 10*3/uL (ref 4.0–10.5)

## 2014-07-17 MED ORDER — POLYSACCHARIDE IRON COMPLEX 150 MG PO CAPS
150.0000 mg | ORAL_CAPSULE | Freq: Every day | ORAL | Status: DC
  Filled 2014-07-17: qty 1

## 2014-07-17 NOTE — Progress Notes (Addendum)
9:05 AM Patient called RN via room phone expressing to RN that he is "feeling better" and "has an appointment at 11AM with pain doctor" RN explained that MD needs to assess him first and that RN is now heading to progression to find out if he is stable to be discharged. RN also explained that he can sign AMA paperwork if he cannot wait. RN explained consequences and safety issues of leaving against medical advice. Patient states "I can get all of my prescription from my PCP" Patient also states that he will "leave regardless" if MD does not discharge him by 10AM. RN states that she will notify MD.  RN went into patients room to update patient. Patient not seen in room or on unit. MD Vann aware.

## 2014-07-17 NOTE — Care Management Note (Signed)
Case Management Note  Patient Details  Name: Todd [name] Orsino MRN: [id number] Date of Birth: 07/14/1970  Subjective/Objective:       Patient left AMA.             Action/Plan:   Expected Discharge Date:  07/19/14               Expected Discharge Plan:  Home/Self Care  In-House Referral:     Discharge planning Services  CM Consult  Post Acute Care Choice:    Choice offered to:     DME Arranged:    DME Agency:     HH Arranged:    HH Agency:     Status of Service:  Completed, signed off  Medicare Important Message Given:  N/A - LOS <3 / Initial given by admissions Date Medicare IM Given:    Medicare IM give by:    Date Additional Medicare IM Given:    Additional Medicare Important Message give by:     If discussed at Long Length of Stay Meetings, dates discussed:    Additional Comments:  Leone Havenaylor, Jennifermarie Franzen Clinton, RN 07/17/2014, 11:08 AM

## 2014-07-18 DIAGNOSIS — D649 Anemia, unspecified: Secondary | ICD-10-CM | POA: Diagnosis not present

## 2014-07-18 DIAGNOSIS — M25551 Pain in right hip: Secondary | ICD-10-CM | POA: Diagnosis not present

## 2014-07-18 DIAGNOSIS — Z471 Aftercare following joint replacement surgery: Secondary | ICD-10-CM | POA: Diagnosis not present

## 2014-07-18 DIAGNOSIS — Z96641 Presence of right artificial hip joint: Secondary | ICD-10-CM | POA: Diagnosis not present

## 2014-07-18 LAB — TYPE AND SCREEN
ABO/RH(D): A POS
Antibody Screen: POSITIVE
DAT, IgG: NEGATIVE
Donor AG Type: NEGATIVE
Donor AG Type: NEGATIVE
Unit division: 0
Unit division: 0

## 2014-08-08 ENCOUNTER — Telehealth: Payer: Self-pay | Admitting: Oncology

## 2014-08-08 ENCOUNTER — Telehealth: Payer: Self-pay | Admitting: Internal Medicine

## 2014-08-08 NOTE — Telephone Encounter (Signed)
Returned call to pt and scheduled new pt appt with Dr. Clelia CroftShadad.   Shadad 08/30/14 10:30 AM

## 2014-08-08 NOTE — Telephone Encounter (Signed)
Called pt and left message regarding new pt appt.    Dx: Low HGB

## 2014-08-23 DIAGNOSIS — I10 Essential (primary) hypertension: Secondary | ICD-10-CM | POA: Diagnosis not present

## 2014-08-23 DIAGNOSIS — E785 Hyperlipidemia, unspecified: Secondary | ICD-10-CM | POA: Diagnosis not present

## 2014-08-23 DIAGNOSIS — F419 Anxiety disorder, unspecified: Secondary | ICD-10-CM | POA: Diagnosis not present

## 2014-08-23 DIAGNOSIS — D509 Iron deficiency anemia, unspecified: Secondary | ICD-10-CM | POA: Diagnosis not present

## 2014-08-23 DIAGNOSIS — M25551 Pain in right hip: Secondary | ICD-10-CM | POA: Diagnosis not present

## 2014-08-23 DIAGNOSIS — G894 Chronic pain syndrome: Secondary | ICD-10-CM | POA: Diagnosis not present

## 2014-08-23 DIAGNOSIS — F3342 Major depressive disorder, recurrent, in full remission: Secondary | ICD-10-CM | POA: Diagnosis not present

## 2014-08-28 ENCOUNTER — Telehealth: Payer: Self-pay | Admitting: Oncology

## 2014-08-28 NOTE — Telephone Encounter (Signed)
CALLED PATIENT TO CONFIRM NP APPT FOR 06/23 @ 10:30 W/DR. SHADAD

## 2014-08-30 ENCOUNTER — Ambulatory Visit: Payer: Medicare Other

## 2014-08-30 ENCOUNTER — Other Ambulatory Visit: Payer: Medicare Other

## 2014-08-30 ENCOUNTER — Encounter: Payer: Self-pay | Admitting: Oncology

## 2014-08-30 ENCOUNTER — Telehealth: Payer: Self-pay | Admitting: Oncology

## 2014-08-30 ENCOUNTER — Ambulatory Visit (HOSPITAL_BASED_OUTPATIENT_CLINIC_OR_DEPARTMENT_OTHER): Payer: Medicare Other | Admitting: Oncology

## 2014-08-30 VITALS — BP 127/63 | HR 92 | Temp 98.1°F | Resp 18 | Ht 74.0 in | Wt 238.1 lb

## 2014-08-30 DIAGNOSIS — D62 Acute posthemorrhagic anemia: Secondary | ICD-10-CM

## 2014-08-30 DIAGNOSIS — D509 Iron deficiency anemia, unspecified: Secondary | ICD-10-CM

## 2014-08-30 DIAGNOSIS — K219 Gastro-esophageal reflux disease without esophagitis: Secondary | ICD-10-CM | POA: Diagnosis not present

## 2014-08-30 DIAGNOSIS — I10 Essential (primary) hypertension: Secondary | ICD-10-CM

## 2014-08-30 NOTE — Progress Notes (Signed)
Please see consult note.  

## 2014-08-30 NOTE — Progress Notes (Signed)
Checked in new pt with no financial concerns. °

## 2014-08-30 NOTE — Telephone Encounter (Signed)
per pof to sch pt appt-gave pt copy of avs °
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
## 2014-09-12 DIAGNOSIS — M25551 Pain in right hip: Secondary | ICD-10-CM | POA: Diagnosis not present

## 2014-09-12 DIAGNOSIS — S32009A Unspecified fracture of unspecified lumbar vertebra, initial encounter for closed fracture: Secondary | ICD-10-CM | POA: Diagnosis not present

## 2014-09-12 DIAGNOSIS — Z79891 Long term (current) use of opiate analgesic: Secondary | ICD-10-CM | POA: Diagnosis not present

## 2014-09-12 DIAGNOSIS — G894 Chronic pain syndrome: Secondary | ICD-10-CM | POA: Diagnosis not present

## 2014-09-13 ENCOUNTER — Ambulatory Visit
Admission: RE | Admit: 2014-09-13 | Discharge: 2014-09-13 | Disposition: A | Discharge status: Home / Self Care | Payer: Medicare Other | Source: Ambulatory Visit | Attending: Anesthesiology | Admitting: Anesthesiology

## 2014-09-13 ENCOUNTER — Other Ambulatory Visit: Payer: Self-pay | Admitting: Anesthesiology

## 2014-09-13 DIAGNOSIS — S32010A Wedge compression fracture of first lumbar vertebra, initial encounter for closed fracture: Secondary | ICD-10-CM | POA: Diagnosis not present

## 2014-09-18 ENCOUNTER — Emergency Department (HOSPITAL_COMMUNITY): Payer: Medicare Other

## 2014-09-18 ENCOUNTER — Encounter (HOSPITAL_COMMUNITY): Payer: Self-pay

## 2014-09-18 ENCOUNTER — Emergency Department (HOSPITAL_COMMUNITY)
Admission: EM | Admit: 2014-09-18 | Discharge: 2014-09-18 | Disposition: A | Discharge status: Home / Self Care | Payer: Medicare Other | Attending: Emergency Medicine | Admitting: Emergency Medicine

## 2014-09-18 DIAGNOSIS — T148 Other injury of unspecified body region: Secondary | ICD-10-CM | POA: Diagnosis not present

## 2014-09-18 DIAGNOSIS — K219 Gastro-esophageal reflux disease without esophagitis: Secondary | ICD-10-CM | POA: Diagnosis not present

## 2014-09-18 DIAGNOSIS — R410 Disorientation, unspecified: Secondary | ICD-10-CM | POA: Diagnosis not present

## 2014-09-18 DIAGNOSIS — S79911A Unspecified injury of right hip, initial encounter: Secondary | ICD-10-CM | POA: Diagnosis present

## 2014-09-18 DIAGNOSIS — Y998 Other external cause status: Secondary | ICD-10-CM | POA: Insufficient documentation

## 2014-09-18 DIAGNOSIS — T84090A Other mechanical complication of internal right hip prosthesis, initial encounter: Secondary | ICD-10-CM | POA: Diagnosis not present

## 2014-09-18 DIAGNOSIS — Y9389 Activity, other specified: Secondary | ICD-10-CM | POA: Insufficient documentation

## 2014-09-18 DIAGNOSIS — Z79899 Other long term (current) drug therapy: Secondary | ICD-10-CM | POA: Insufficient documentation

## 2014-09-18 DIAGNOSIS — Y92000 Kitchen of unspecified non-institutional (private) residence as  the place of occurrence of the external cause: Secondary | ICD-10-CM | POA: Insufficient documentation

## 2014-09-18 DIAGNOSIS — Z9889 Other specified postprocedural states: Secondary | ICD-10-CM

## 2014-09-18 DIAGNOSIS — R4781 Slurred speech: Secondary | ICD-10-CM | POA: Insufficient documentation

## 2014-09-18 DIAGNOSIS — I10 Essential (primary) hypertension: Secondary | ICD-10-CM | POA: Diagnosis not present

## 2014-09-18 DIAGNOSIS — S73004D Unspecified dislocation of right hip, subsequent encounter: Secondary | ICD-10-CM | POA: Insufficient documentation

## 2014-09-18 DIAGNOSIS — S73001D Unspecified subluxation of right hip, subsequent encounter: Secondary | ICD-10-CM | POA: Diagnosis not present

## 2014-09-18 DIAGNOSIS — W010XXA Fall on same level from slipping, tripping and stumbling without subsequent striking against object, initial encounter: Secondary | ICD-10-CM | POA: Insufficient documentation

## 2014-09-18 DIAGNOSIS — M25559 Pain in unspecified hip: Secondary | ICD-10-CM | POA: Diagnosis not present

## 2014-09-18 MED ORDER — ETOMIDATE 2 MG/ML IV SOLN
INTRAVENOUS | Status: AC | PRN
  Administered 2014-09-18 (×2): 5 mg via INTRAVENOUS

## 2014-09-18 MED ORDER — ETOMIDATE 2 MG/ML IV SOLN
10.0000 mg | Freq: Once | INTRAVENOUS | Status: AC
  Administered 2014-09-18: 10 mg via INTRAVENOUS
  Filled 2014-09-18: qty 10

## 2014-09-18 NOTE — ED Notes (Signed)
Patient transported to X-ray 

## 2014-09-18 NOTE — ED Provider Notes (Addendum)
CSN: [id number]     Arrival date & time 09/18/14  0231 History  This chart was scribed for No att. providers found by Abel Presto, ED Scribe. This patient was seen in room TRABC/TRABC and the patient's care was started at 2:37 AM.    Chief Complaint  Patient presents with  . Hip Injury    LEVEL 5 CAVEAT  The history is provided by the patient and the EMS personnel. The history is limited by the condition of the patient. No language interpreter was used.   HPI Comments: Todd [name] is a 44 y.o. male brought in by ambulance, who presents to the Emergency Department complaining of right hip injury with onset around 8:30 PM. Pt states he was getting up from a couch to retrieve some water from the kitchen and twisted wrong causing him to fall. Fall was not witnessed. Pt mumbling and slurring words in exam room. 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 Pt notes EtOH this evening. He has Rx for Vicodin and Flexeril. Pt sees a pain specialist at [facility]. PT is followed by Dr. August Saucer, orthopedic surgeon.    Past Medical History  Diagnosis Date  . Hypertension   . GERD (gastroesophageal reflux disease)   . H/O hiatal hernia    Past Surgical History  Procedure Laterality Date  . Appendectomy      as child  . Joint replacement  2005    Right Hip  . Esophagogastroduodenoscopy (egd) with propofol  02/17/2012    Procedure: ESOPHAGOGASTRODUODENOSCOPY (EGD) WITH PROPOFOL;  Surgeon: Willis Modena, MD;  Location: [facility] ENDOSCOPY;  Service: Endoscopy;  Laterality: N/A;  . Colonoscopy with propofol  02/17/2012    Procedure: COLONOSCOPY WITH PROPOFOL;  Surgeon: Willis Modena, MD;  Location: [facility] ENDOSCOPY;  Service: Endoscopy;  Laterality: N/A;  . Esophagogastroduodenoscopy Left 09/19/2013    Procedure: ESOPHAGOGASTRODUODENOSCOPY (EGD);  Surgeon: Willis Modena, MD;  Location: [facility] ENDOSCOPY;  Service: Endoscopy;  Laterality: Left;  . Hip closed reduction Right 10/20/2013   Procedure: CLOSED REDUCTION HIP;  Surgeon: Eldred Manges, MD;  Location: [facility] OR;  Service: Orthopedics;  Laterality: Right;   Family History  Problem Relation Age of Onset  . Diabetes Mother    History  Substance Use Topics  . Smoking status: Never Smoker   . Smokeless tobacco: Never Used  . Alcohol Use: Yes     Comment: social    Review of Systems  Unable to perform ROS: Other      Allergies  Ambien; Clonidine derivatives; and Nu-iron  Home Medications   Prior to Admission medications   Medication Sig Start Date End Date Taking? Authorizing Provider  ALPRAZolam Prudy Feeler) 1 MG tablet Take 1 mg by mouth at bedtime as needed for anxiety. *may repeat in 3-4 hrs if needed*    Historical Provider, MD  atorvastatin (LIPITOR) 40 MG tablet Take 40 mg by mouth daily.    Historical Provider, MD  cyclobenzaprine (FLEXERIL) 10 MG tablet Take 1 tablet (10 mg total) by mouth 2 (two) times daily as needed for muscle spasms. 06/26/14   Marlon Pel, PA-C  ferrous fumarate (HEMOCYTE - 106 MG FE) 325 (106 FE) MG TABS tablet Take 1 tablet by mouth 3 (three) times daily.     Historical Provider, MD  FLUoxetine (PROZAC) 40 MG capsule Take 40 mg by mouth daily.    Historical Provider, MD  loratadine (CLARITIN) 10 MG tablet Take 10 mg by mouth daily as needed for allergies.  Historical Provider, MD  Melatonin 10 MG TABS Take 10 mg by mouth daily.    Historical Provider, MD  metoprolol (LOPRESSOR) 50 MG tablet Take 50 mg by mouth 2 (two) times daily.    Historical Provider, MD  Multiple Vitamin (MULTIVITAMIN) tablet Take 1 tablet by mouth daily.    Historical Provider, MD  pantoprazole (PROTONIX) 40 MG tablet Take 40 mg by mouth 2 (two) times daily.    Historical Provider, MD  QUEtiapine (SEROQUEL) 200 MG tablet Take 200 mg by mouth at bedtime.    Historical Provider, MD  sildenafil (REVATIO) 20 MG tablet Take 20 mg by mouth 3 (three) times daily as needed (erectile dysfunction).    Historical  Provider, MD   BP 97/62 mmHg  Pulse 74  Temp(Src) 97.4 F (36.3 C)  Resp 16  SpO2 94% Physical Exam  Constitutional: He is oriented to person, place, and time. He appears well-developed and well-nourished. He appears distressed.  Appears older than stated age  HENT:  Head: Normocephalic and atraumatic.  Nose: Nose normal.  Mouth/Throat: Oropharynx is clear and moist.  Eyes: Conjunctivae and EOM are normal. Pupils are equal, round, and reactive to light.  Neck: Normal range of motion. Neck supple. No JVD present. No tracheal deviation present. No thyromegaly present.  Cardiovascular: Normal rate, regular rhythm, normal heart sounds and intact distal pulses.  Exam reveals no gallop and no friction rub.   No murmur heard. Pulmonary/Chest: Effort normal and breath sounds normal. No stridor. No respiratory distress. He has no wheezes. He has no rales. He exhibits no tenderness.  Abdominal: Soft. Bowel sounds are normal. He exhibits no distension and no mass. There is no tenderness. There is no rebound and no guarding.  Musculoskeletal: He exhibits tenderness. He exhibits no edema.  Shortened and rotated on right  Lymphadenopathy:    He has no cervical adenopathy.  Neurological: He is alert and oriented to person, place, and time. He displays normal reflexes. He exhibits normal muscle tone. Coordination normal.  Skin: Skin is warm and dry. No rash noted. No erythema. No pallor.  Psychiatric: He has a normal mood and affect. His behavior is normal. Judgment and thought content normal.  Nursing note and vitals reviewed.   ED Course  Procedures (including critical care time) DIAGNOSTIC STUDIES: Oxygen Saturation is 93% on room air, normal by my interpretation.    COORDINATION OF CARE: 2:52 AM Discussed treatment plan with patient at beside, the patient agrees with the plan and has no further questions at this time.   Labs Review Labs Reviewed - No data to display  Imaging Review Dg  Hip Unilat With Pelvis 1v Right  09/18/2014   CLINICAL DATA:  Right hip prosthesis with dislocation. Hip surgery in 2004.  EXAM: DG HIP (WITH OR WITHOUT PELVIS) 1V RIGHT  COMPARISON:  10/20/2013  FINDINGS: Right total hip arthroplasty using long-stem femoral component. The distal portion of the component is not included within the field of view. Superolateral and posterior dislocation of the femoral prosthesis with respect to the acetabular cup. No acute fractures demonstrated. Degenerative changes in the left hip.  IMPRESSION: Right hip arthroplasty with dislocation of the femoral prosthesis with respect to the acetabulum.   Electronically Signed   By: Burman Nieves M.D.   On: 09/18/2014 03:45   Dg Hip Port Unilat With Pelvis 1v Right  09/18/2014   CLINICAL DATA:  Postreduction.  EXAM: DG HIP (WITH OR WITHOUT PELVIS) 1V PORT RIGHT  COMPARISON:  09/18/2014  FINDINGS: Right hip arthroplasty. Dislocation of the arthroplasty components on the initial view. Postreduction view #2 demonstrates relocation of the femoral prosthesis without significant residual dislocation. Long-stem femoral component is noted but the distal aspect of the component is not included. Cerclage wires are present. Cortical thickening along the femoral shaft consistent with callus formation from old fracture deformity.  IMPRESSION: Good alignment of right hip arthroplasty noted postreduction.   Electronically Signed   By: Burman NievesWilliam  Stevens M.D.   On: 09/18/2014 05:50     EKG Interpretation None     Reduction of dislocation Date/Time: 7:58 PM Performed by: Olivia MackieTTER,Miyako Oelke M Authorized by: Olivia MackieTTER,Kahlyn Shippey M Consent: Verbal consent obtained. Risks and benefits: risks, benefits and alternatives were discussed Consent given by: patient Required items: required blood products, implants, devices, and special equipment available Time out: Immediately prior to procedure a "time out" was called to verify the correct patient, procedure, equipment,  support staff and site/side marked as required.  Patient sedated:  Yes etomidate  Vitals: Vital signs were monitored during sedation. Patient tolerance: Patient tolerated the procedure well with no immediate complications. Joint: left hip Reduction technique: Darrel Reachaptain Morgan, leg across provider's knee.  Reduction required two attempts for completion.  Pt tolerated well.  MDM   Final diagnoses:  Hip dislocation, right, subsequent encounter    I personally performed the services described in this documentation, which was scribed in my presence. The recorded information has been reviewed and is accurate.  [age] yo male with right hip dislocation, h/o same.  Pt is confused, slurred speech, unclear onset of dislocation.  I have seen patient for same in past, and had more pronounced obtundation.  Plan for xrays, reduction if his sensorium clears.  Xrays obtained.  Pt more clear, and appears capable of understanding consent.    Pt required two dosing of etomidate to get hip reduced.  [facility]    Marisa Severinlga Rodrick Payson, MD 09/18/14 [phone]  Marisa Severinlga Starling Jessie, MD 09/25/14 [id number]  Marisa Severinlga Miyako Oelke, MD 09/25/14 [id number]

## 2014-09-18 NOTE — ED Notes (Signed)
Dr. Otter at the bedside.  

## 2014-09-18 NOTE — ED Notes (Signed)
Pt back from x-ray.

## 2014-09-18 NOTE — ED Notes (Signed)
Pt. Getting agitated that he needs more pain meds. Delay explained to pt. Pts words still slurring and talking about home prescribed meds that he needs right now. Nurse notified.

## 2014-09-18 NOTE — Sedation Documentation (Signed)
After portable xray, showed that hip was not back into place. Marian Sorrowlga MD still at bedside, going to admin more etomidate and consciously sedate again

## 2014-09-18 NOTE — ED Notes (Signed)
Arrived via ems, in pelvic binding sheet to assist in stabilization of hip

## 2014-09-18 NOTE — ED Notes (Signed)
Per [facility], pt from home for a right hip dislocation. Hx of total hip replacement to right side with multiple hip dislocations. Walked into kitchen to get something to drink and slipped and fell. Wife heard him fall but did not witness. 20g to left hand and given 150 mcg fentanyl. Has rx for vicodin and flexeril.

## 2014-10-08 DIAGNOSIS — M25551 Pain in right hip: Secondary | ICD-10-CM | POA: Diagnosis not present

## 2014-10-08 DIAGNOSIS — Z79891 Long term (current) use of opiate analgesic: Secondary | ICD-10-CM | POA: Diagnosis not present

## 2014-10-08 DIAGNOSIS — S32009A Unspecified fracture of unspecified lumbar vertebra, initial encounter for closed fracture: Secondary | ICD-10-CM | POA: Diagnosis not present

## 2014-10-08 DIAGNOSIS — G894 Chronic pain syndrome: Secondary | ICD-10-CM | POA: Diagnosis not present

## 2014-11-05 DIAGNOSIS — S32009A Unspecified fracture of unspecified lumbar vertebra, initial encounter for closed fracture: Secondary | ICD-10-CM | POA: Diagnosis not present

## 2014-11-05 DIAGNOSIS — M25551 Pain in right hip: Secondary | ICD-10-CM | POA: Diagnosis not present

## 2014-11-05 DIAGNOSIS — G894 Chronic pain syndrome: Secondary | ICD-10-CM | POA: Diagnosis not present

## 2014-11-05 DIAGNOSIS — Z79891 Long term (current) use of opiate analgesic: Secondary | ICD-10-CM | POA: Diagnosis not present

## 2014-11-20 ENCOUNTER — Other Ambulatory Visit: Payer: Medicare Other

## 2014-11-23 ENCOUNTER — Ambulatory Visit: Payer: Medicare Other | Admitting: Oncology

## 2014-11-23 DIAGNOSIS — M25551 Pain in right hip: Secondary | ICD-10-CM | POA: Diagnosis not present

## 2014-11-23 DIAGNOSIS — G894 Chronic pain syndrome: Secondary | ICD-10-CM | POA: Diagnosis not present

## 2014-11-23 DIAGNOSIS — S32009A Unspecified fracture of unspecified lumbar vertebra, initial encounter for closed fracture: Secondary | ICD-10-CM | POA: Diagnosis not present

## 2014-11-23 DIAGNOSIS — Z79891 Long term (current) use of opiate analgesic: Secondary | ICD-10-CM | POA: Diagnosis not present

## 2014-11-26 DIAGNOSIS — Z79891 Long term (current) use of opiate analgesic: Secondary | ICD-10-CM | POA: Diagnosis not present

## 2014-11-26 DIAGNOSIS — M25551 Pain in right hip: Secondary | ICD-10-CM | POA: Diagnosis not present

## 2014-11-26 DIAGNOSIS — G894 Chronic pain syndrome: Secondary | ICD-10-CM | POA: Diagnosis not present

## 2014-11-30 ENCOUNTER — Emergency Department (HOSPITAL_COMMUNITY): Payer: Medicare Other

## 2014-11-30 ENCOUNTER — Emergency Department (HOSPITAL_COMMUNITY)
Admission: EM | Admit: 2014-11-30 | Discharge: 2014-11-30 | Disposition: A | Discharge status: Home / Self Care | Payer: Medicare Other | Attending: Emergency Medicine | Admitting: Emergency Medicine

## 2014-11-30 DIAGNOSIS — T84020A Dislocation of internal right hip prosthesis, initial encounter: Secondary | ICD-10-CM | POA: Diagnosis not present

## 2014-11-30 DIAGNOSIS — Z9889 Other specified postprocedural states: Secondary | ICD-10-CM | POA: Insufficient documentation

## 2014-11-30 DIAGNOSIS — Y998 Other external cause status: Secondary | ICD-10-CM | POA: Diagnosis not present

## 2014-11-30 DIAGNOSIS — S79911A Unspecified injury of right hip, initial encounter: Secondary | ICD-10-CM | POA: Diagnosis present

## 2014-11-30 DIAGNOSIS — Y92009 Unspecified place in unspecified non-institutional (private) residence as the place of occurrence of the external cause: Secondary | ICD-10-CM | POA: Diagnosis not present

## 2014-11-30 DIAGNOSIS — K219 Gastro-esophageal reflux disease without esophagitis: Secondary | ICD-10-CM | POA: Insufficient documentation

## 2014-11-30 DIAGNOSIS — Y9301 Activity, walking, marching and hiking: Secondary | ICD-10-CM | POA: Insufficient documentation

## 2014-11-30 DIAGNOSIS — T84029A Dislocation of unspecified internal joint prosthesis, initial encounter: Secondary | ICD-10-CM

## 2014-11-30 DIAGNOSIS — M25551 Pain in right hip: Secondary | ICD-10-CM | POA: Diagnosis not present

## 2014-11-30 DIAGNOSIS — Z4789 Encounter for other orthopedic aftercare: Secondary | ICD-10-CM | POA: Diagnosis not present

## 2014-11-30 DIAGNOSIS — X58XXXA Exposure to other specified factors, initial encounter: Secondary | ICD-10-CM | POA: Insufficient documentation

## 2014-11-30 DIAGNOSIS — I10 Essential (primary) hypertension: Secondary | ICD-10-CM | POA: Diagnosis not present

## 2014-11-30 DIAGNOSIS — S73004A Unspecified dislocation of right hip, initial encounter: Secondary | ICD-10-CM | POA: Diagnosis not present

## 2014-11-30 DIAGNOSIS — I6789 Other cerebrovascular disease: Secondary | ICD-10-CM | POA: Diagnosis not present

## 2014-11-30 DIAGNOSIS — R4781 Slurred speech: Secondary | ICD-10-CM | POA: Diagnosis not present

## 2014-11-30 MED ORDER — ETOMIDATE 2 MG/ML IV SOLN
0.1500 mg/kg | Freq: Once | INTRAVENOUS | Status: AC
  Administered 2014-11-30: 16.66 mg via INTRAVENOUS
  Filled 2014-11-30: qty 10

## 2014-11-30 MED ORDER — ETOMIDATE 2 MG/ML IV SOLN
INTRAVENOUS | Status: AC | PRN
  Administered 2014-11-30: 16.665 mg via INTRAVENOUS

## 2014-11-30 NOTE — Discharge Instructions (Signed)
As discussed, it is important that you follow up as soon as possible with your physician for continued management of your condition. ° °If you develop any new, or concerning changes in your condition, please return to the emergency department immediately. ° °

## 2014-11-30 NOTE — ED Notes (Signed)
Bed: JY78 Expected date: 11/30/14 Expected time: 12:50 AM Means of arrival: Ambulance Comments: 44 yo M  Hip displacement

## 2014-11-30 NOTE — ED Notes (Addendum)
Walking to bathroom at home and right  hip dislocated EMS reports that this has happened many  Times d/t recall on apparatus.

## 2014-11-30 NOTE — Sedation Documentation (Signed)
Hip relocated back in place by Jeraldine Loots, MD

## 2014-11-30 NOTE — Sedation Documentation (Signed)
Knee immobilizer placed on pt per MD by tech.

## 2014-11-30 NOTE — ED Provider Notes (Signed)
CSN: [id number]   Arrival date & time 11/30/14 0109  History  This chart was scribed for Todd Munch, MD by Todd [name], ED Scribe. This patient was seen in room WA18/WA18 and the patient's care was started at 2:09 AM.  Chief Complaint  Patient presents with  . Hip Pain    HPI The history is provided by the patient. No language interpreter was used.   Todd [name] is a 44 y.o. male with history of right hip replacement and recurrent right hip dislocations who presents to the Emergency Department complaining of constant right hip pain with onset tonight after getting out of bed. His dog was in the way causing him to stand awkwardly and heard a "pop". He has had multiple right hip dislocations ("too many to count") secondary to a recall on his implant. Pt denies loss of sensation, fall, and head injury.   Past Medical History  Diagnosis Date  . Hypertension   . GERD (gastroesophageal reflux disease)   . H/O hiatal hernia     Past Surgical History  Procedure Laterality Date  . Appendectomy      as child  . Joint replacement  2005    Right Hip  . Esophagogastroduodenoscopy (egd) with propofol  02/17/2012    Procedure: ESOPHAGOGASTRODUODENOSCOPY (EGD) WITH PROPOFOL;  Surgeon: Willis Modena, MD;  Location: [facility] ENDOSCOPY;  Service: Endoscopy;  Laterality: N/A;  . Colonoscopy with propofol  02/17/2012    Procedure: COLONOSCOPY WITH PROPOFOL;  Surgeon: Willis Modena, MD;  Location: [facility] ENDOSCOPY;  Service: Endoscopy;  Laterality: N/A;  . Esophagogastroduodenoscopy Left 09/19/2013    Procedure: ESOPHAGOGASTRODUODENOSCOPY (EGD);  Surgeon: Willis Modena, MD;  Location: [facility] ENDOSCOPY;  Service: Endoscopy;  Laterality: Left;  . Hip closed reduction Right 10/20/2013    Procedure: CLOSED REDUCTION HIP;  Surgeon: Eldred Manges, MD;  Location: [facility] OR;  Service: Orthopedics;  Laterality: Right;    Family History  Problem Relation Age of Onset  . Diabetes Mother     Social History  Substance  Use Topics  . Smoking status: Never Smoker   . Smokeless tobacco: Never Used  . Alcohol Use: Yes     Comment: social     Review of Systems  Constitutional:       Per HPI, otherwise negative  HENT:       Per HPI, otherwise negative  Respiratory:       Per HPI, otherwise negative  Cardiovascular:       Per HPI, otherwise negative  Gastrointestinal: Negative for vomiting.  Endocrine:       Negative aside from HPI  Genitourinary:       Neg aside from HPI   Musculoskeletal:       Per HPI, otherwise negative  Skin: Negative.   Neurological: Negative for syncope.   Home Medications   Prior to Admission medications   Medication Sig Start Date End Date Taking? Authorizing Provider  pantoprazole (PROTONIX) 40 MG tablet Take 40 mg by mouth 2 (two) times daily.   Yes Historical Provider, MD  QUEtiapine (SEROQUEL) 200 MG tablet Take 200 mg by mouth at bedtime.   Yes Historical Provider, MD  ALPRAZolam Prudy Feeler) 1 MG tablet Take 1 mg by mouth at bedtime as needed for anxiety. *may repeat in 3-4 hrs if needed*    Historical Provider, MD  atorvastatin (LIPITOR) 40 MG tablet Take 40 mg by mouth daily.    Historical Provider, MD  cyclobenzaprine (FLEXERIL) 10 MG tablet Take 1 tablet (10 mg  total) by mouth 2 (two) times daily as needed for muscle spasms. 06/26/14   Marlon Pel, PA-C  ferrous fumarate (HEMOCYTE - 106 MG FE) 325 (106 FE) MG TABS tablet Take 1 tablet by mouth 3 (three) times daily.     Historical Provider, MD  FLUoxetine (PROZAC) 40 MG capsule Take 40 mg by mouth daily.    Historical Provider, MD  loratadine (CLARITIN) 10 MG tablet Take 10 mg by mouth daily as needed for allergies.     Historical Provider, MD  Melatonin 10 MG TABS Take 10 mg by mouth at bedtime as needed (sleep).     Historical Provider, MD  metoprolol (LOPRESSOR) 50 MG tablet Take 50 mg by mouth 2 (two) times daily.    Historical Provider, MD  Multiple Vitamin (MULTIVITAMIN) tablet Take 1 tablet by mouth daily.     Historical Provider, MD  Oxycodone HCl 10 MG TABS Take 10 mg by mouth every 4 (four) hours as needed (pain).    Historical Provider, MD    Allergies  Ambien; Clonidine derivatives; and Nu-iron  Triage Vitals: Pulse 70  Temp(Src) 98.8 F (37.1 C) (Oral)  Resp 16  SpO2 94%  Physical Exam  Constitutional: He is oriented to person, place, and time. He appears well-developed. No distress.  HENT:  Head: Normocephalic and atraumatic.  Eyes: Conjunctivae and EOM are normal.  Cardiovascular: Normal rate and regular rhythm.   Pulmonary/Chest: Effort normal. No stridor. No respiratory distress.  Abdominal: He exhibits no distension.  Musculoskeletal: He exhibits no edema.  Gross deformity of right hip Shortening External rotation at right hip  Neurological: He is alert and oriented to person, place, and time.  Skin: Skin is warm and dry.  Psychiatric: He has a normal mood and affect.  Nursing note and vitals reviewed.   ED Course  Reduction of dislocation Date/Time: 11/30/2014 4:45 AM Performed by: Todd [name] Authorized by: Todd [name] Consent: The procedure was performed in an emergent situation. Verbal consent obtained. Risks and benefits: risks, benefits and alternatives were discussed Consent given by: patient Patient understanding: patient states understanding of the procedure being performed Patient consent: the patient's understanding of the procedure matches consent given Procedure consent: procedure consent matches procedure scheduled Relevant documents: relevant documents present and verified Test results: test results available and properly labeled Site marked: the operative site was marked Imaging studies: imaging studies available Required items: required blood products, implants, devices, and special equipment available Patient identity confirmed: verbally with patient Time out: Immediately prior to procedure a "time out" was called to verify the correct  patient, procedure, equipment, support staff and site/side marked as required. Preparation: Patient was prepped and draped in the usual sterile fashion. Local anesthesia used: no Patient sedated: yes Sedation type: moderate (conscious) sedation Sedatives: etomidate and see MAR for details Sedation start date/time: 11/30/2014 4:30 AM Sedation end date/time: 11/30/2014 4:46 AM Vitals: Vital signs were monitored during sedation. Patient tolerance: Patient tolerated the procedure well with no immediate complications Comments: After the patient was appropriately sedated, with skeletal traction, and internal/external rotation of the hip, the hip was reduced, with an audible sensation. I reviewed the x-rays afterwards, confirming reduction of the hip. Patient had knee immobilizer placed, by myself and nursing staff. This was all well tolerated. Patient awoke without complication.     COORDINATION OF CARE: 2:13 AM Discussed treatment plan which includes right hip XR with pt at bedside and pt agreed to plan. Imaging Review Dg Hip Unilat With Pelvis 2-3 Views  Right  11/30/2014   CLINICAL DATA:  Pain.  Hip dislocation.  EXAM: DG HIP (WITH OR WITHOUT PELVIS) 2-3V RIGHT  COMPARISON:  09/18/2014  FINDINGS: Right hip arthroplasty using long-stem femoral component with cerclage wires. Distal portion of the component is not included within the field of view. There is superior and lateral dislocation of the right femoral prosthesis with respect to the acetabular cup. No acute fractures are identified.  IMPRESSION: Right hip arthroplasty with dislocation.   Electronically Signed   By: Burman Nieves M.D.   On: 11/30/2014 02:58    Reviewed both pre-and postreduction x-rays, agree with the interpretation MDM   Patient with lax right prosthetic hip presents after sustaining hip dislocation. Patient is distally neurovascularly intact. Patient was sedated, with etomidate, and had successful reduction of his  dislocated hip.  I personally performed the services described in this documentation, which was scribed in my presence. The recorded information has been reviewed and is accurate.      Todd Munch, MD 11/30/14 [phone]

## 2014-11-30 NOTE — ED Notes (Signed)
Patient transported to X-ray 

## 2014-12-10 DIAGNOSIS — G4700 Insomnia, unspecified: Secondary | ICD-10-CM | POA: Diagnosis not present

## 2014-12-10 DIAGNOSIS — G894 Chronic pain syndrome: Secondary | ICD-10-CM | POA: Diagnosis not present

## 2014-12-10 DIAGNOSIS — M25551 Pain in right hip: Secondary | ICD-10-CM | POA: Diagnosis not present

## 2014-12-27 DIAGNOSIS — Z23 Encounter for immunization: Secondary | ICD-10-CM | POA: Diagnosis not present

## 2015-01-04 DIAGNOSIS — M25551 Pain in right hip: Secondary | ICD-10-CM | POA: Diagnosis not present

## 2015-01-04 DIAGNOSIS — G894 Chronic pain syndrome: Secondary | ICD-10-CM | POA: Diagnosis not present

## 2015-02-17 ENCOUNTER — Emergency Department (HOSPITAL_COMMUNITY): Payer: Medicare Other

## 2015-02-17 ENCOUNTER — Inpatient Hospital Stay (HOSPITAL_COMMUNITY)
Admission: EM | Admit: 2015-02-17 | Discharge: 2015-02-18 | DRG: 561 | Disposition: A | Discharge status: Home / Self Care | Payer: Medicare Other | Attending: Internal Medicine | Admitting: Internal Medicine

## 2015-02-17 DIAGNOSIS — Z79899 Other long term (current) drug therapy: Secondary | ICD-10-CM

## 2015-02-17 DIAGNOSIS — T148 Other injury of unspecified body region: Secondary | ICD-10-CM | POA: Diagnosis not present

## 2015-02-17 DIAGNOSIS — Z96641 Presence of right artificial hip joint: Secondary | ICD-10-CM | POA: Diagnosis not present

## 2015-02-17 DIAGNOSIS — Z96649 Presence of unspecified artificial hip joint: Secondary | ICD-10-CM

## 2015-02-17 DIAGNOSIS — R400 Somnolence: Secondary | ICD-10-CM | POA: Diagnosis not present

## 2015-02-17 DIAGNOSIS — T50901A Poisoning by unspecified drugs, medicaments and biological substances, accidental (unintentional), initial encounter: Secondary | ICD-10-CM | POA: Diagnosis not present

## 2015-02-17 DIAGNOSIS — T84020A Dislocation of internal right hip prosthesis, initial encounter: Principal | ICD-10-CM | POA: Diagnosis present

## 2015-02-17 DIAGNOSIS — I10 Essential (primary) hypertension: Secondary | ICD-10-CM | POA: Diagnosis not present

## 2015-02-17 DIAGNOSIS — S73004A Unspecified dislocation of right hip, initial encounter: Secondary | ICD-10-CM

## 2015-02-17 DIAGNOSIS — R4182 Altered mental status, unspecified: Secondary | ICD-10-CM | POA: Diagnosis not present

## 2015-02-17 DIAGNOSIS — T84029A Dislocation of unspecified internal joint prosthesis, initial encounter: Secondary | ICD-10-CM

## 2015-02-17 DIAGNOSIS — K449 Diaphragmatic hernia without obstruction or gangrene: Secondary | ICD-10-CM

## 2015-02-17 DIAGNOSIS — K219 Gastro-esophageal reflux disease without esophagitis: Secondary | ICD-10-CM | POA: Diagnosis not present

## 2015-02-17 DIAGNOSIS — Z419 Encounter for procedure for purposes other than remedying health state, unspecified: Secondary | ICD-10-CM

## 2015-02-17 DIAGNOSIS — M25551 Pain in right hip: Secondary | ICD-10-CM | POA: Diagnosis not present

## 2015-02-17 NOTE — ED Notes (Signed)
Bed: WA02 Expected date:  Expected time:  Means of arrival:  Comments: EMS hip dislocation

## 2015-02-17 NOTE — ED Notes (Signed)
Pt via [facility] with recurrent Right Hip dislocation. Pt reaching to grab item off end table, felt a pop. Per EMS shortening with rotation he took his pain meds prior to arrival. Per mother "pt probably takes too much." Denies pain at triage.
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
## 2015-02-18 ENCOUNTER — Encounter (HOSPITAL_COMMUNITY)
Admission: EM | Disposition: A | Discharge status: Home / Self Care | Payer: Self-pay | Source: Home / Self Care | Attending: Internal Medicine

## 2015-02-18 ENCOUNTER — Emergency Department (HOSPITAL_COMMUNITY): Payer: Medicare Other

## 2015-02-18 ENCOUNTER — Observation Stay (HOSPITAL_COMMUNITY): Payer: Medicare Other

## 2015-02-18 ENCOUNTER — Encounter (HOSPITAL_COMMUNITY): Payer: Self-pay | Admitting: Emergency Medicine

## 2015-02-18 ENCOUNTER — Observation Stay (HOSPITAL_COMMUNITY): Payer: Medicare Other | Admitting: Anesthesiology

## 2015-02-18 DIAGNOSIS — T84029S Dislocation of unspecified internal joint prosthesis, sequela: Secondary | ICD-10-CM

## 2015-02-18 DIAGNOSIS — K219 Gastro-esophageal reflux disease without esophagitis: Secondary | ICD-10-CM | POA: Diagnosis present

## 2015-02-18 DIAGNOSIS — Z96641 Presence of right artificial hip joint: Secondary | ICD-10-CM | POA: Diagnosis present

## 2015-02-18 DIAGNOSIS — S73004A Unspecified dislocation of right hip, initial encounter: Secondary | ICD-10-CM | POA: Diagnosis not present

## 2015-02-18 DIAGNOSIS — Z79899 Other long term (current) drug therapy: Secondary | ICD-10-CM | POA: Diagnosis not present

## 2015-02-18 DIAGNOSIS — S73004S Unspecified dislocation of right hip, sequela: Secondary | ICD-10-CM | POA: Diagnosis not present

## 2015-02-18 DIAGNOSIS — M25551 Pain in right hip: Secondary | ICD-10-CM | POA: Diagnosis not present

## 2015-02-18 DIAGNOSIS — I10 Essential (primary) hypertension: Secondary | ICD-10-CM | POA: Diagnosis present

## 2015-02-18 DIAGNOSIS — R4182 Altered mental status, unspecified: Secondary | ICD-10-CM | POA: Diagnosis not present

## 2015-02-18 DIAGNOSIS — T84020A Dislocation of internal right hip prosthesis, initial encounter: Secondary | ICD-10-CM | POA: Diagnosis present

## 2015-02-18 DIAGNOSIS — K449 Diaphragmatic hernia without obstruction or gangrene: Secondary | ICD-10-CM | POA: Diagnosis present

## 2015-02-18 DIAGNOSIS — R400 Somnolence: Secondary | ICD-10-CM | POA: Diagnosis present

## 2015-02-18 HISTORY — PX: HIP CLOSED REDUCTION: SHX983

## 2015-02-18 HISTORY — DX: Dislocation of internal right hip prosthesis, initial encounter: T84.020A

## 2015-02-18 LAB — I-STAT CHEM 8, ED
BUN: 18 mg/dL (ref 6–20)
Calcium, Ion: 1.13 mmol/L (ref 1.12–1.23)
Chloride: 97 mmol/L — ABNORMAL LOW (ref 101–111)
Creatinine, Ser: 1.2 mg/dL (ref 0.61–1.24)
Glucose, Bld: 92 mg/dL (ref 65–99)
HCT: 35 % — ABNORMAL LOW (ref 39.0–52.0)
Hemoglobin: 11.9 g/dL — ABNORMAL LOW (ref 13.0–17.0)
Potassium: 4.6 mmol/L (ref 3.5–5.1)
Sodium: 141 mmol/L (ref 135–145)
TCO2: 30 mmol/L (ref 0–100)

## 2015-02-18 LAB — CREATININE, SERUM
Creatinine, Ser: 1.21 mg/dL (ref 0.61–1.24)
GFR calc Af Amer: >60 mL/min (ref >60)
GFR calc non Af Amer: >60 mL/min (ref >60)

## 2015-02-18 LAB — CBC WITH DIFFERENTIAL/PLATELET
Basophils Absolute: 0 10*3/uL (ref 0.0–0.1)
Basophils Relative: 1 %
Eosinophils Absolute: 0.3 10*3/uL (ref 0.0–0.7)
Eosinophils Relative: 5 %
HCT: 34.7 % — ABNORMAL LOW (ref 39.0–52.0)
Hemoglobin: 10.8 g/dL — ABNORMAL LOW (ref 13.0–17.0)
Lymphocytes Relative: 19 %
Lymphs Abs: 1.1 10*3/uL (ref 0.7–4.0)
MCH: 27.3 pg (ref 26.0–34.0)
MCHC: 31.1 g/dL (ref 30.0–36.0)
MCV: 87.6 fL (ref 78.0–100.0)
Monocytes Absolute: 0.8 10*3/uL (ref 0.1–1.0)
Monocytes Relative: 14 %
Neutro Abs: 3.6 10*3/uL (ref 1.7–7.7)
Neutrophils Relative %: 61 %
Platelets: 299 10*3/uL (ref 150–400)
RBC: 3.96 MIL/uL — ABNORMAL LOW (ref 4.22–5.81)
RDW: 14 % (ref 11.5–15.5)
WBC: 5.8 10*3/uL (ref 4.0–10.5)

## 2015-02-18 LAB — ETHANOL: Alcohol, Ethyl (B): <5 mg/dL (ref <5)

## 2015-02-18 LAB — SURGICAL PCR SCREEN
MRSA, PCR: NEGATIVE
Staphylococcus aureus: NEGATIVE

## 2015-02-18 LAB — I-STAT TROPONIN, ED: Troponin i, poc: 0 ng/mL (ref 0.00–0.08)

## 2015-02-18 LAB — ACETAMINOPHEN LEVEL: Acetaminophen (Tylenol), Serum: <10 ug/mL — ABNORMAL LOW (ref 10–30)

## 2015-02-18 LAB — SALICYLATE LEVEL: Salicylate Lvl: <4 mg/dL (ref 2.8–30.0)

## 2015-02-18 SURGERY — CLOSED REDUCTION, HIP
Anesthesia: General | Laterality: Right

## 2015-02-18 MED ORDER — OXYCODONE HCL 10 MG PO TABS: 10.0000 mg | ORAL_TABLET | ORAL | Status: DC | PRN

## 2015-02-18 MED ORDER — LIDOCAINE HCL (CARDIAC) 20 MG/ML IV SOLN
INTRAVENOUS | Status: AC
  Filled 2015-02-18: qty 5

## 2015-02-18 MED ORDER — HYDROMORPHONE HCL 1 MG/ML IJ SOLN: 1.0000 mg | INTRAMUSCULAR | Status: DC | PRN

## 2015-02-18 MED ORDER — LACTATED RINGERS IV SOLN
INTRAVENOUS | Status: DC | PRN
  Administered 2015-02-18: 17:00:00 via INTRAVENOUS

## 2015-02-18 MED ORDER — FLUOXETINE HCL 20 MG PO CAPS
40.0000 mg | ORAL_CAPSULE | Freq: Every day | ORAL | Status: DC
  Administered 2015-02-18: 40 mg via ORAL
  Filled 2015-02-18: qty 2

## 2015-02-18 MED ORDER — KETOROLAC TROMETHAMINE 30 MG/ML IJ SOLN
30.0000 mg | Freq: Once | INTRAMUSCULAR | Status: AC
  Administered 2015-02-18: 30 mg via INTRAVENOUS
  Filled 2015-02-18: qty 1

## 2015-02-18 MED ORDER — MIDAZOLAM HCL 2 MG/2ML IJ SOLN
INTRAMUSCULAR | Status: AC
  Filled 2015-02-18: qty 2

## 2015-02-18 MED ORDER — ONDANSETRON HCL 4 MG/2ML IJ SOLN: 4.0000 mg | Freq: Four times a day (QID) | INTRAMUSCULAR | Status: DC | PRN

## 2015-02-18 MED ORDER — ACETAMINOPHEN 325 MG PO TABS: 650.0000 mg | ORAL_TABLET | Freq: Four times a day (QID) | ORAL | Status: DC | PRN

## 2015-02-18 MED ORDER — PANTOPRAZOLE SODIUM 40 MG PO TBEC
40.0000 mg | DELAYED_RELEASE_TABLET | Freq: Two times a day (BID) | ORAL | Status: DC
  Administered 2015-02-18: 40 mg via ORAL
  Filled 2015-02-18: qty 1

## 2015-02-18 MED ORDER — HYDROMORPHONE HCL 1 MG/ML IJ SOLN
1.0000 mg | Freq: Once | INTRAMUSCULAR | Status: AC
  Administered 2015-02-18: 1 mg via INTRAVENOUS
  Filled 2015-02-18: qty 1

## 2015-02-18 MED ORDER — FENTANYL CITRATE (PF) 100 MCG/2ML IJ SOLN
INTRAMUSCULAR | Status: AC
  Filled 2015-02-18: qty 2

## 2015-02-18 MED ORDER — LORATADINE 10 MG PO TABS
10.0000 mg | ORAL_TABLET | Freq: Every day | ORAL | Status: DC | PRN
  Filled 2015-02-18: qty 1

## 2015-02-18 MED ORDER — LIDOCAINE HCL (CARDIAC) 20 MG/ML IV SOLN
INTRAVENOUS | Status: DC | PRN
  Administered 2015-02-18: 50 mg via INTRAVENOUS

## 2015-02-18 MED ORDER — LACTATED RINGERS IV SOLN: INTRAVENOUS | Status: DC

## 2015-02-18 MED ORDER — LACTATED RINGERS IV SOLN: INTRAVENOUS | Status: DC | PRN

## 2015-02-18 MED ORDER — HYDROMORPHONE HCL 1 MG/ML IJ SOLN
1.0000 mg | INTRAMUSCULAR | Status: DC | PRN
  Administered 2015-02-18 (×2): 1 mg via INTRAVENOUS
  Administered 2015-02-18: 2 mg via INTRAVENOUS
  Filled 2015-02-18 (×2): qty 2
  Filled 2015-02-18: qty 1

## 2015-02-18 MED ORDER — MIDAZOLAM HCL 5 MG/5ML IJ SOLN
INTRAMUSCULAR | Status: DC | PRN
  Administered 2015-02-18: 2 mg via INTRAVENOUS

## 2015-02-18 MED ORDER — SODIUM CHLORIDE 0.9 % IV SOLN: INTRAVENOUS | Status: DC

## 2015-02-18 MED ORDER — HYDROMORPHONE HCL 1 MG/ML IJ SOLN
1.0000 mg | INTRAMUSCULAR | Status: DC | PRN
  Filled 2015-02-18: qty 1

## 2015-02-18 MED ORDER — ENOXAPARIN SODIUM 40 MG/0.4ML ~~LOC~~ SOLN
40.0000 mg | SUBCUTANEOUS | Status: DC
  Administered 2015-02-18: 40 mg via SUBCUTANEOUS
  Filled 2015-02-18: qty 0.4

## 2015-02-18 MED ORDER — MEPERIDINE HCL 50 MG/ML IJ SOLN: 6.2500 mg | INTRAMUSCULAR | Status: DC | PRN

## 2015-02-18 MED ORDER — METOPROLOL TARTRATE 50 MG PO TABS
50.0000 mg | ORAL_TABLET | Freq: Two times a day (BID) | ORAL | Status: DC
  Administered 2015-02-18: 50 mg via ORAL
  Filled 2015-02-18 (×2): qty 1

## 2015-02-18 MED ORDER — FENTANYL CITRATE (PF) 100 MCG/2ML IJ SOLN
50.0000 ug | Freq: Once | INTRAMUSCULAR | Status: AC
  Administered 2015-02-18: 50 ug via INTRAVENOUS
  Filled 2015-02-18: qty 2

## 2015-02-18 MED ORDER — FENTANYL CITRATE (PF) 100 MCG/2ML IJ SOLN
INTRAMUSCULAR | Status: DC | PRN
  Administered 2015-02-18: 100 ug via INTRAVENOUS

## 2015-02-18 MED ORDER — ONDANSETRON HCL 4 MG/2ML IJ SOLN
INTRAMUSCULAR | Status: AC
  Filled 2015-02-18: qty 2

## 2015-02-18 MED ORDER — PROPOFOL 10 MG/ML IV BOLUS
INTRAVENOUS | Status: AC
  Filled 2015-02-18: qty 20

## 2015-02-18 MED ORDER — QUETIAPINE FUMARATE 200 MG PO TABS
200.0000 mg | ORAL_TABLET | Freq: Every day | ORAL | Status: DC
  Filled 2015-02-18: qty 1

## 2015-02-18 MED ORDER — ONDANSETRON HCL 4 MG/2ML IJ SOLN
INTRAMUSCULAR | Status: DC | PRN
  Administered 2015-02-18: 4 mg via INTRAVENOUS

## 2015-02-18 MED ORDER — ACETAMINOPHEN 650 MG RE SUPP: 650.0000 mg | Freq: Four times a day (QID) | RECTAL | Status: DC | PRN

## 2015-02-18 MED ORDER — ONDANSETRON HCL 4 MG PO TABS: 4.0000 mg | ORAL_TABLET | Freq: Four times a day (QID) | ORAL | Status: DC | PRN

## 2015-02-18 MED ORDER — PROPOFOL 10 MG/ML IV BOLUS
INTRAVENOUS | Status: DC | PRN
  Administered 2015-02-18: 200 mg via INTRAVENOUS

## 2015-02-18 MED ORDER — PROMETHAZINE HCL 25 MG/ML IJ SOLN: 6.2500 mg | INTRAMUSCULAR | Status: DC | PRN

## 2015-02-18 MED ORDER — FENTANYL CITRATE (PF) 100 MCG/2ML IJ SOLN: 25.0000 ug | INTRAMUSCULAR | Status: DC | PRN

## 2015-02-18 NOTE — ED Notes (Signed)
Pt states "I apologize for carrying on, I really didn't understand why this was taking so long with time when all the other times things were different." This RN explained the process and procedures that we were/are taking in order to be most safe in his care. Pt agrees and says thank you.

## 2015-02-18 NOTE — ED Notes (Addendum)
Pt stating he tried and tried but can't squeeze any pee out, I can't remember the last time I had anything to drink today.

## 2015-02-18 NOTE — Discharge Instructions (Signed)
Wear knee immobilizer on your right leg. Use walker.

## 2015-02-18 NOTE — Progress Notes (Signed)
On call hospitalist paged (Dr. Elisabeth Pigeonevine) regarding pain management. Also spoke with Dr. Lajoyce Cornersuda who states he will not be performing procedure on patient. Possible transfer to [facility] to see Dr. Ophelia CharterYates who performed procedure in the past.

## 2015-02-18 NOTE — ED Notes (Signed)
This Consulting civil engineerCharge RN was notified by Misty StanleyLisa NS that the Pt called from his Inpatient room.  Sts he is extremely upset, because he was admitted.

## 2015-02-18 NOTE — Consult Note (Signed)
primary problem:Dislocated right hip   Todd [name] is an 44 y.o. male.  HPI: The patient is a 44 year old male who had a history of right hip arthroplasty many years ago.  He's had subsequent dislocations.  He currently was admitted to the hospitalist service and the bone orthopedics was consulted.  They asked that we see the patient because of his index surgery being done in our office.  We are getting involved with treatment of the right hip at their request.  Past Medical History  Diagnosis Date  . Hypertension   . GERD (gastroesophageal reflux disease)   . H/O hiatal hernia     Past Surgical History  Procedure Laterality Date  . Appendectomy      as child  . Joint replacement  2005    Right Hip  . Esophagogastroduodenoscopy (egd) with propofol  02/17/2012    Procedure: ESOPHAGOGASTRODUODENOSCOPY (EGD) WITH PROPOFOL;  Surgeon: Arta Silence, MD;  Location: [facility] ENDOSCOPY;  Service: Endoscopy;  Laterality: N/A;  . Colonoscopy with propofol  02/17/2012    Procedure: COLONOSCOPY WITH PROPOFOL;  Surgeon: Arta Silence, MD;  Location: [facility] ENDOSCOPY;  Service: Endoscopy;  Laterality: N/A;  . Esophagogastroduodenoscopy Left 09/19/2013    Procedure: ESOPHAGOGASTRODUODENOSCOPY (EGD);  Surgeon: Arta Silence, MD;  Location: [facility] ENDOSCOPY;  Service: Endoscopy;  Laterality: Left;  . Hip closed reduction Right 10/20/2013    Procedure: CLOSED REDUCTION HIP;  Surgeon: Marybelle Killings, MD;  Location: [place];  Service: Orthopedics;  Laterality: Right;    Family History  Problem Relation Age of Onset  . Diabetes Mother     Social History:  reports that he has never smoked. He has never used smokeless tobacco. He reports that he drinks about 2.4 oz of alcohol per week. He reports that he does not use illicit drugs.  Allergies:  Allergies  Allergen Reactions  . Ambien [Zolpidem Tartrate] Other (See Comments)    sedation  . Clonidine Derivatives Other (See Comments)    Caused Katherina Right  syndrome  . Nu-Iron [Polysaccharide Iron Complex] Diarrhea    Medications: I have reviewed the patient's current medications.  Results for orders placed or performed during the hospital encounter of 02/17/15 (from the past 48 hour(s))  CBC with Differential/Platelet     Status: Abnormal   Collection Time: 02/18/15  1:44 AM  Result Value Ref Range   WBC 5.8 4.0 - 10.5 K/uL   RBC 3.96 (L) 4.22 - 5.81 MIL/uL   Hemoglobin 10.8 (L) 13.0 - 17.0 g/dL   HCT 34.7 (L) 39.0 - 52.0 %   MCV 87.6 78.0 - 100.0 fL   MCH 27.3 26.0 - 34.0 pg   MCHC 31.1 30.0 - 36.0 g/dL   RDW 14.0 11.5 - 15.5 %   Platelets 299 150 - 400 K/uL   Neutrophils Relative % 61 %   Neutro Abs 3.6 1.7 - 7.7 K/uL   Lymphocytes Relative 19 %   Lymphs Abs 1.1 0.7 - 4.0 K/uL   Monocytes Relative 14 %   Monocytes Absolute 0.8 0.1 - 1.0 K/uL   Eosinophils Relative 5 %   Eosinophils Absolute 0.3 0.0 - 0.7 K/uL   Basophils Relative 1 %   Basophils Absolute 0.0 0.0 - 0.1 K/uL  Acetaminophen level     Status: Abnormal   Collection Time: 02/18/15  1:44 AM  Result Value Ref Range   Acetaminophen (Tylenol), Serum <10 (L) 10 - 30 ug/mL    Comment:  THERAPEUTIC CONCENTRATIONS VARY SIGNIFICANTLY. A RANGE OF 10-30 ug/mL MAY BE AN EFFECTIVE CONCENTRATION FOR MANY PATIENTS. HOWEVER, SOME ARE BEST TREATED AT CONCENTRATIONS OUTSIDE THIS RANGE. ACETAMINOPHEN CONCENTRATIONS >150 ug/mL AT 4 HOURS AFTER INGESTION AND >50 ug/mL AT 12 HOURS AFTER INGESTION ARE OFTEN ASSOCIATED WITH TOXIC REACTIONS.   Salicylate level     Status: None   Collection Time: 02/18/15  1:44 AM  Result Value Ref Range   Salicylate Lvl <1.6 2.8 - 30.0 mg/dL  Ethanol     Status: None   Collection Time: 02/18/15  1:44 AM  Result Value Ref Range   Alcohol, Ethyl (B) <5 <5 mg/dL    Comment:        LOWEST DETECTABLE LIMIT FOR SERUM ALCOHOL IS 5 mg/dL FOR MEDICAL PURPOSES ONLY   Creatinine, serum     Status: None   Collection Time: 02/18/15  1:44 AM   Result Value Ref Range   Creatinine, Ser 1.21 0.61 - 1.24 mg/dL   GFR calc non Af Amer >60 >60 mL/min   GFR calc Af Amer >60 >60 mL/min    Comment: (NOTE) The eGFR has been calculated using the CKD EPI equation. This calculation has not been validated in all clinical situations. eGFR's persistently <60 mL/min signify possible Chronic Kidney Disease.   I-stat troponin, ED     Status: None   Collection Time: 02/18/15  1:55 AM  Result Value Ref Range   Troponin i, poc 0.00 0.00 - 0.08 ng/mL   Comment 3            Comment: Due to the release kinetics of cTnI, a negative result within the first hours of the onset of symptoms does not rule out myocardial infarction with certainty. If myocardial infarction is still suspected, repeat the test at appropriate intervals.   I-stat chem 8, ed     Status: Abnormal   Collection Time: 02/18/15  1:58 AM  Result Value Ref Range   Sodium 141 135 - 145 mmol/L   Potassium 4.6 3.5 - 5.1 mmol/L   Chloride 97 (L) 101 - 111 mmol/L   BUN 18 6 - 20 mg/dL   Creatinine, Ser 1.20 0.61 - 1.24 mg/dL   Glucose, Bld 92 65 - 99 mg/dL   Calcium, Ion 1.13 1.12 - 1.23 mmol/L   TCO2 30 0 - 100 mmol/L   Hemoglobin 11.9 (L) 13.0 - 17.0 g/dL   HCT 35.0 (L) 39.0 - 52.0 %  Surgical pcr screen     Status: None   Collection Time: 02/18/15  7:02 AM  Result Value Ref Range   MRSA, PCR NEGATIVE NEGATIVE   Staphylococcus aureus NEGATIVE NEGATIVE    Comment:        The Xpert SA Assay (FDA approved for NASAL specimens in patients over 1 years of age), is one component of a comprehensive surveillance program.  Test performance has been validated by [facility] for patients greater than or equal to 61 year old. It is not intended to diagnose infection nor to guide or monitor treatment.     Dg Chest 2 View  02/18/2015  CLINICAL DATA:  Acute onset of altered mental status. Initial encounter. EXAM: CHEST  2 VIEW COMPARISON:  Chest radiograph from 06/25/2014  FINDINGS: The lungs are well-aerated. Minimal bilateral atelectasis is noted. There is no evidence of pleural effusion or pneumothorax. The heart is mildly enlarged. A large hiatal hernia is noted, containing fluid and air. No acute osseous abnormalities are seen. Chronic right-sided rib  deformities are again noted. IMPRESSION: 1. Minimal bilateral atelectasis noted.  Lungs otherwise clear. 2. Mild cardiomegaly. 3. Large hiatal hernia, containing fluid and air. Electronically Signed   By: Garald Balding M.D.   On: 02/18/2015 02:15   Ct Head Wo Contrast  02/18/2015  CLINICAL DATA:  Recurrent right hip dislocations. Altered mental status. Patient has taken pain medications. EXAM: CT HEAD WITHOUT CONTRAST TECHNIQUE: Contiguous axial images were obtained from the base of the skull through the vertex without intravenous contrast. COMPARISON:  06/25/2014 FINDINGS: Ventricles and sulci appear symmetrical. No ventricular dilatation. No mass effect or midline shift. No abnormal extra-axial fluid collections. Gray-white matter junctions are distinct. Basal cisterns are not effaced. No evidence of acute intracranial hemorrhage. No depressed skull fractures. Visualized paranasal sinuses and mastoid air cells are not opacified. IMPRESSION: No acute intracranial abnormalities. Electronically Signed   By: Lucienne Capers M.D.   On: 02/18/2015 02:02   Dg Hip Unilat  With Pelvis 2-3 Views Right  02/17/2015  CLINICAL DATA:  Patient felt a pop while grafting something off of the table tonight. History of recurrent right hip dislocations. EXAM: DG HIP (WITH OR WITHOUT PELVIS) 2-3V RIGHT COMPARISON:  11/30/2014 FINDINGS: Right total hip arthroplasty with long-stem femoral component. The femoral component is incompletely included within the field of view. Cerclage wires are present along the proximal and mid femoral shaft with cortical thickening consistent with old healed fracture deformity. There is new superior and posterior  dislocation of the right femoral prosthesis from the acetabular cup. No acute fractures are demonstrated. Pelvis and visualized left hip appear intact. SI joints and symphysis pubis are not displaced. IMPRESSION: Dislocation of right hip arthroplasty. Electronically Signed   By: Lucienne Capers M.D.   On: 02/17/2015 23:53    ROS  ROS: I have reviewed the patient's review of systems thoroughly and there are no positive responses as relates to the HPI. Blood pressure 123/72, pulse 64, temperature 98.3 F (36.8 C), temperature source Oral, resp. rate 18, height 6' 2"  (1.88 m), weight 105.9 kg (233 lb 7.5 oz), SpO2 95 %. Physical Exam Well-developed well-nourished patient in no acute distress. Alert and oriented x3 HEENT:within normal limits Cardiac: Regular rate and rhythm Pulmonary: Lungs clear to auscultation Abdomen: Soft and nontender.  Normal active bowel sounds  Musculoskeletal: (Right hip: Pain with all range of motion.  Limited range of motion.  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  He currently has a right hip dislocation. .//We feel that given that the patient has been reduced the operating room before and hasHistory of narcotic use that we will need to get him to the operating room for reduction.I have had a prolonged discussion with the patient regarding the risk and benefits of the surgical procedure.  The patient understands the risks include but are not limited to bleeding infection and failure of the surgery to cure the problem and need for further surgery.  The patient understands there is a slight risk of death at the time of surgery.  The patient understands these risks along with the potential benefits and wishes to proceed with surgical intervention.   The patient will be followed in the office in the postoperative period.   Reneisha Stilley,Florencio L 02/18/2015, 10:23 AM

## 2015-02-18 NOTE — Transfer of Care (Signed)
Immediate Anesthesia Transfer of Care Note  Patient: Todd HightJohn D Haug  Procedure(s) Performed: Procedure(s): CLOSED REDUCTION HIP (Right)  Patient Location: PACU  Anesthesia Type:General  Level of Consciousness: awake, alert  and oriented  Airway & Oxygen Therapy: Patient Spontanous Breathing and Patient connected to face mask oxygen  Post-op Assessment: Report given to RN and Post -op Vital signs reviewed and stable  Post vital signs: Reviewed and stable  Last Vitals:  Filed Vitals:   02/18/15 0609 02/18/15 1400  BP:  129/83  Pulse: 64 70  Temp: 36.8 C 37 C  Resp: 18 16    Complications: No apparent anesthesia complications

## 2015-02-18 NOTE — ED Notes (Signed)
Patient transported to CT 

## 2015-02-18 NOTE — ED Notes (Signed)
On assessment patient very lethargic and is slurring speech. States that he took his meds before his arrival. He is also requesting Darvocet. Pt reoriented NAD at triage. Unable to verify how many pills he had taken and denies alcohol use.

## 2015-02-18 NOTE — ED Notes (Signed)
Consulting civil engineerCharge RN at door. [name].   Now hospitalist is at the bedside.

## 2015-02-18 NOTE — ED Notes (Signed)
Pt asking for pain medications. Will notify EDP

## 2015-02-18 NOTE — ED Notes (Signed)
Patient cursing loudly in his room while talking on the phone. Patient is angry that he is not being given pain medications. Patient is also ranting about the physician and [facility]. Patient was asked to please stop swearing as he is creating a disturbance. Patient then said to this nurse "close the god damn door". Door was closed. Patient continued his conversation continuing to speak very loudly and swearing. Charge nurse was notified of patient behaviors.

## 2015-02-18 NOTE — Anesthesia Procedure Notes (Signed)
Procedure Name: LMA Insertion Date/Time: 02/18/2015 5:47 PM Performed by: Enriqueta ShutterWILLIFORD, Naia Ruff D Pre-anesthesia Checklist: Patient identified, Emergency Drugs available, Suction available and Patient being monitored Patient Re-evaluated:Patient Re-evaluated prior to inductionOxygen Delivery Method: Circle System Utilized Preoxygenation: Pre-oxygenation with 100% oxygen Intubation Type: IV induction Ventilation: Mask ventilation without difficulty LMA Size: 5.0 Tube type: Oral Number of attempts: 1 Placement Confirmation: positive ETCO2 and breath sounds checked- equal and bilateral Tube secured with: Tape Dental Injury: Teeth and Oropharynx as per pre-operative assessment

## 2015-02-18 NOTE — ED Notes (Signed)
EDP at bedside discussing plan with patient.

## 2015-02-18 NOTE — Progress Notes (Signed)
Per Dr. Lajoyce Cornersuda, Dr. Luiz BlareGraves will perform reduction on right hip sometime today. Hospitalist Dr. Elisabeth Pigeonevine paged again regarding pain medication order. Patient speaking loudly, talking on the phone in his room.

## 2015-02-18 NOTE — Progress Notes (Signed)
Patient seen and examined at the bedside.   Pt admitted after midnight. Please refer to admission note done 02/18/2015. Pt admitted for dislocation of right hip arthroplasty.  A very upset that he is not getting pain medications. I explained to him orders placed for Dilaudid 1-2 mg every 2 hours as needed. He was initially screaming, yelling and upset that pain meds were not ordered on admission. I explained to him we have placed order now. He calmed down. Ortho to see him later on in consultation. He is upset he has to wait long.  Appreciate RN help.  Manson Passeylma Devine [facility] [phone]

## 2015-02-18 NOTE — ED Notes (Signed)
Urine sample being attempted.

## 2015-02-18 NOTE — ED Notes (Signed)
Pt repositioned. Pt states he does not understand why this is taking so long, I am OxyContin and I need it. I am close to getting crutches and walking to [facility].  This RN will notify the EDP and ask that she speak directly with patient.

## 2015-02-18 NOTE — ED Notes (Signed)
This Consulting civil engineerCharge RN was notified by Misty StanleyLisa NS that the Pt called from his Inpatient room. Sts he is extremely upset, because he was admitted.  Sts he typically has his hip reduced in the ED and is discharged home.  Further, he stated that he wanted to be transferred back to the ED.  When the Pt was informed that being transferred to the ED is not an option, the Pt stated "then I'll walk down there and they'll have to see me."  This Charge RN called the 6E Charge to inform her of the situation.  Sts she is aware and her Director is involved.
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
## 2015-02-18 NOTE — Anesthesia Preprocedure Evaluation (Addendum)
Anesthesia Evaluation  Patient identified by MRN, date of birth, ID band Patient awake    Reviewed: Allergy & Precautions, NPO status , Patient's Chart, lab work & pertinent test results, reviewed documented beta blocker date and time   Airway Mallampati: II   Neck ROM: Full    Dental  (+) Teeth Intact, Poor Dentition   Pulmonary    breath sounds clear to auscultation       Cardiovascular hypertension, Pt. on medications and Pt. on home beta blockers  Rhythm:Regular Rate:Normal     Neuro/Psych negative neurological ROS  negative psych ROS   GI/Hepatic Neg liver ROS, GERD  Medicated,  Endo/Other  negative endocrine ROS  Renal/GU negative Renal ROS  negative genitourinary   Musculoskeletal negative musculoskeletal ROS (+)   Abdominal   Peds negative pediatric ROS (+)  Hematology   Anesthesia Other Findings   Reproductive/Obstetrics negative OB ROS                           Lab Results  Component Value Date   WBC 5.8 02/18/2015   HGB 11.9* 02/18/2015   HCT 35.0* 02/18/2015   MCV 87.6 02/18/2015   PLT 299 02/18/2015   Lab Results  Component Value Date   CREATININE 1.20 02/18/2015   BUN 18 02/18/2015   NA 141 02/18/2015   K 4.6 02/18/2015   CL 97* 02/18/2015   CO2 21* 07/17/2014   EKG: normal sinus rhythm.   Anesthesia Physical Anesthesia Plan  ASA: II  Anesthesia Plan: General   Post-op Pain Management:    Induction: Intravenous  Airway Management Planned: LMA  Additional Equipment:   Intra-op Plan:   Post-operative Plan: Extubation in OR  Informed Consent: I have reviewed the patients History and Physical, chart, labs and discussed the procedure including the risks, benefits and alternatives for the proposed anesthesia with the patient or authorized representative who has indicated his/her understanding and acceptance.   Dental advisory given  Plan Discussed  with: CRNA  Anesthesia Plan Comments:         Anesthesia Quick Evaluation

## 2015-02-18 NOTE — Brief Op Note (Signed)
02/17/2015 - 02/18/2015  5:57 PM  PATIENT:  Todd [name]  44 y.o. male  PRE-OPERATIVE DIAGNOSIS:  dislocated right hip  POST-OPERATIVE DIAGNOSIS:  * No post-op diagnosis entered *  PROCEDURE:  Procedure(s): CLOSED REDUCTION HIP (Right)  SURGEON:  Surgeon(s) and Role:    * Jodi GeraldsJohn Emmalise Huard, MD - Primary  PHYSICIAN ASSISTANT:   ASSISTANTS: bethune   ANESTHESIA:   general  EBL:     BLOOD ADMINISTERED:none  DRAINS: none and Urinary Catheter (Suprapubic)   LOCAL MEDICATIONS USED:  NONE  SPECIMEN:  No Specimen  DISPOSITION OF SPECIMEN:  N/A  COUNTS:  YES  TOURNIQUET:  * No tourniquets in log *  DICTATION: .Other Dictation: Dictation Number [id number]  PLAN OF CARE: Discharge to home after PACU  PATIENT DISPOSITION:  PACU - hemodynamically stable.   Delay start of Pharmacological VTE agent (>24hrs) due to surgical blood loss or risk of bleeding: no

## 2015-02-18 NOTE — ED Notes (Signed)
Per EDP, pt stated to provider hx of drinking prior to arrival in the EDP. Pt explained that this will cause of delay.

## 2015-02-19 ENCOUNTER — Encounter (HOSPITAL_COMMUNITY): Payer: Self-pay | Admitting: Orthopedic Surgery

## 2015-02-19 NOTE — Op Note (Signed)
NAMMarland Kitchen:  Everardo PacificKENNEY, Trayson                 ACCOUNT NO.:  [id number]  MEDICAL RECORD NO.:  [id number]  LOCATION:  1612                         [facility]  PHYSICIAN:  Harvie JuniorJohn L. Seerat Peaden, M.D.   DATE OF BIRTH:  1970-07-02  DATE OF PROCEDURE:  02/18/2015 DATE OF DISCHARGE:  02/18/2015                              OPERATIVE REPORT   PREOPERATIVE DIAGNOSIS:  Dislocated right hip.  POSTOPERATIVE DIAGNOSIS:  Dislocated right hip.  PROCEDURE:  Closed reduction of dislocated right hip in the operating room.  SURGEON:  Harvie JuniorJohn L. Zamoria Boss, M.D.  ASSISTANT:  Marshia LyJames Bethune, P.A.  ANESTHESIA:  General.  BRIEF HISTORY:  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  He tolerated that procedure well.  He has had some difficulty since that time, apparently having multiple dislocations.  He has really not had tremendous followup in our office.  He has been in the emergency room multiple times with dislocations that had been reduced and he has not had additional followup in our office.  [facility] has [facility] by [facility] as well.  He was brought in today by Dr. Aldean BakerMarcus Duda or at least, Dr. Aldean BakerMarcus Duda called me this morning about needing him to be taken care of, someone had admitted to the hospital, it was not clear who, was not me.  I evaluated him this afternoon as a favor to the admitting team and noted him to have a hip dislocation.  He was taken to the operating room for fixation.  DESCRIPTION OF PROCEDURE:  The patient was taken to the operating room and after adequate anesthesia was obtained with general anesthetic, the patient was placed supine on the dropping table.  The right hip was then reduced under anesthesia and postoperative x-ray was taken ensuring that this was in fact reduced.  Once this was done, he was awakened and taken to the recovery room where he will be observed and  ultimately discharged either tonight or first thing in the morning.  I have had a long discussion with the patient today, he needs to follow up with a joint replacement specialist to have evaluation and discussion about the possibility of additional surgical intervention.  He does not appropriate for him to continue to come to the emergency room having had this high number of dislocations.  We will give him minimal amount of pain medication as it is possible that part of this is pain-seeking behavior.  We will see him back in our office in a followup and hopefully, he will keep that appointment.     Harvie JuniorJohn L. Zoriana Oats, M.D.     Ranae PlumberJLG/MEDQ  D:  02/18/2015  T:  02/19/2015  Job:  [id number]

## 2015-02-19 NOTE — Anesthesia Postprocedure Evaluation (Signed)
Anesthesia Post Note  Patient: Todd [name]  Procedure(s) Performed: Procedure(s) (LRB): CLOSED REDUCTION HIP (Right)  Patient location during evaluation: PACU Anesthesia Type: General Level of consciousness: awake and alert Pain management: pain level controlled Vital Signs Assessment: post-procedure vital signs reviewed and stable Respiratory status: spontaneous breathing, nonlabored ventilation, respiratory function stable and patient connected to nasal cannula oxygen Cardiovascular status: blood pressure returned to baseline and stable Postop Assessment: no signs of nausea or vomiting Anesthetic complications: no    Last Vitals:  Filed Vitals:   02/18/15 1830 02/18/15 1845  BP: 123/73 130/70  Pulse: 65 62  Temp:  36.4 C  Resp: 11 12    Last Pain:  Filed Vitals:   02/18/15 1857  PainSc: 0-No pain                 Shelton SilvasKevin D Antrone Walla

## 2015-02-21 ENCOUNTER — Encounter (HOSPITAL_COMMUNITY): Payer: Self-pay | Admitting: Orthopedic Surgery

## 2015-02-22 ENCOUNTER — Emergency Department (HOSPITAL_COMMUNITY): Payer: Medicare Other

## 2015-02-22 ENCOUNTER — Telehealth (HOSPITAL_COMMUNITY): Payer: Self-pay

## 2015-02-22 ENCOUNTER — Emergency Department (HOSPITAL_COMMUNITY)
Admission: EM | Admit: 2015-02-22 | Discharge: 2015-02-22 | Disposition: A | Discharge status: Home / Self Care | Payer: Medicare Other | Attending: Emergency Medicine | Admitting: Emergency Medicine

## 2015-02-22 ENCOUNTER — Encounter (HOSPITAL_COMMUNITY): Payer: Self-pay

## 2015-02-22 DIAGNOSIS — S79911A Unspecified injury of right hip, initial encounter: Secondary | ICD-10-CM | POA: Diagnosis not present

## 2015-02-22 DIAGNOSIS — R52 Pain, unspecified: Secondary | ICD-10-CM | POA: Diagnosis not present

## 2015-02-22 DIAGNOSIS — S73004A Unspecified dislocation of right hip, initial encounter: Secondary | ICD-10-CM

## 2015-02-22 DIAGNOSIS — I10 Essential (primary) hypertension: Secondary | ICD-10-CM | POA: Insufficient documentation

## 2015-02-22 DIAGNOSIS — Z79899 Other long term (current) drug therapy: Secondary | ICD-10-CM | POA: Insufficient documentation

## 2015-02-22 DIAGNOSIS — M25559 Pain in unspecified hip: Secondary | ICD-10-CM | POA: Diagnosis not present

## 2015-02-22 DIAGNOSIS — K219 Gastro-esophageal reflux disease without esophagitis: Secondary | ICD-10-CM | POA: Insufficient documentation

## 2015-02-22 DIAGNOSIS — Y658 Other specified misadventures during surgical and medical care: Secondary | ICD-10-CM | POA: Diagnosis not present

## 2015-02-22 DIAGNOSIS — Z9889 Other specified postprocedural states: Secondary | ICD-10-CM | POA: Diagnosis not present

## 2015-02-22 DIAGNOSIS — T84020A Dislocation of internal right hip prosthesis, initial encounter: Secondary | ICD-10-CM | POA: Insufficient documentation

## 2015-02-22 MED ORDER — KETAMINE HCL 10 MG/ML IJ SOLN
200.0000 mg | Freq: Once | INTRAMUSCULAR | Status: DC
  Filled 2015-02-22: qty 20

## 2015-02-22 MED ORDER — KETAMINE HCL 10 MG/ML IJ SOLN
INTRAMUSCULAR | Status: DC | PRN
  Administered 2015-02-22 (×3): 50 mg via INTRAVENOUS

## 2015-02-22 MED ORDER — SODIUM CHLORIDE 0.9 % IV BOLUS (SEPSIS)
1000.0000 mL | Freq: Once | INTRAVENOUS | Status: AC
  Administered 2015-02-22: 1000 mL via INTRAVENOUS

## 2015-02-22 MED ORDER — HYDROCODONE-ACETAMINOPHEN 5-325 MG PO TABS: 2.0000 | ORAL_TABLET | Freq: Two times a day (BID) | ORAL | Status: DC | PRN

## 2015-02-22 MED ORDER — OXYCODONE HCL 5 MG PO TABS: 5.0000 mg | ORAL_TABLET | Freq: Two times a day (BID) | ORAL | Status: DC | PRN

## 2015-02-22 NOTE — ED Notes (Signed)
Pt verbalized understanding of d/c instructions and has no further questions. Pt d/c home with mother driving.

## 2015-02-22 NOTE — Telephone Encounter (Signed)
Pt calling regarding Rx given to him at ED for Roxicodone 5 mg prescribed by Dr Mora Bellmanni.  Pt reports pharmacy told pt they couldn't fill.  Per Dr Junious Silkni's note script to be destroyed per phamacy reporting pt has rcvd 200 tablets this month and has Rx that will renew 12/27.  Pt informed to return as needed and f/u w/PCP as directed.  Confirmed w/[facility] on [address] that script was destroyed.

## 2015-02-22 NOTE — ED Provider Notes (Addendum)
CSN: [id number]   Arrival date & time 02/22/15 0148  History  By signing my name below, I, Todd [name], attest that this documentation has been prepared under the direction and in the presence of Todd Crumble, MD. Electronically Signed: Bethel [name], ED Scribe. 02/22/2015. 2:38 AM.  Chief Complaint  Patient presents with  . Hip Pain  . Dislocation    HPI The history is provided by the patient. No language interpreter was used.   Brought in by EMS from home, Todd [name] is a 44 y.o. 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 Pt associates his pain with dislocation stating that he has had at least 13 right hip dislocations secondary to recalled hardware in his hip. This morning he stood up after using the bathroom and felt a "pop" in the hip. Pt denies other injury.  Past Medical History  Diagnosis Date  . Hypertension   . GERD (gastroesophageal reflux disease)   . H/O hiatal hernia     Past Surgical History  Procedure Laterality Date  . Appendectomy      as child  . Joint replacement  2005    Right Hip  . Esophagogastroduodenoscopy (egd) with propofol  02/17/2012    Procedure: ESOPHAGOGASTRODUODENOSCOPY (EGD) WITH PROPOFOL;  Surgeon: Willis Modena, MD;  Location: [facility] ENDOSCOPY;  Service: Endoscopy;  Laterality: N/A;  . Colonoscopy with propofol  02/17/2012    Procedure: COLONOSCOPY WITH PROPOFOL;  Surgeon: Willis Modena, MD;  Location: [facility] ENDOSCOPY;  Service: Endoscopy;  Laterality: N/A;  . Esophagogastroduodenoscopy Left 09/19/2013    Procedure: ESOPHAGOGASTRODUODENOSCOPY (EGD);  Surgeon: Willis Modena, MD;  Location: [facility] ENDOSCOPY;  Service: Endoscopy;  Laterality: Left;  . Hip closed reduction Right 10/20/2013    Procedure: CLOSED REDUCTION HIP;  Surgeon: Eldred Manges, MD;  Location: [facility] OR;  Service: Orthopedics;   Laterality: Right;  . Hip closed reduction Right 02/18/2015    Procedure: CLOSED REDUCTION HIP;  Surgeon: Jodi Geralds, MD;  Location: [facility] ORS;  Service: Orthopedics;  Laterality: Right;    Family History  Problem Relation Age of Onset  . Diabetes Mother     Social History  Substance Use Topics  . Smoking status: Never Smoker   . Smokeless tobacco: Never Used  . Alcohol Use: 2.4 oz/week    4 Cans of beer per week     Comment: social     Review of Systems 10 Systems reviewed and all are negative for acute change except as noted in the HPI. Home Medications   Prior to Admission medications   Medication Sig Start Date End Date Taking? Authorizing Provider  ALPRAZolam Prudy Feeler) 1 MG tablet Take 1 mg by mouth at bedtime as needed for anxiety. *may repeat in 3-4 hrs if needed*    Historical Provider, MD  atorvastatin (LIPITOR) 40 MG tablet Take 40 mg by mouth daily.    Historical Provider, MD  ferrous fumarate (HEMOCYTE - 106 MG FE) 325 (106 FE) MG TABS tablet Take 1 tablet by mouth 3 (three) times daily.     Historical Provider, MD  FLUoxetine (PROZAC) 40 MG capsule Take 40 mg by mouth daily.    Historical Provider, MD  loratadine (CLARITIN) 10 MG tablet Take 10 mg by mouth daily as needed for allergies.     Historical Provider, MD  Melatonin 10 MG TABS Take 10 mg by mouth at  bedtime as needed (sleep).     Historical Provider, MD  metoprolol (LOPRESSOR) 50 MG tablet Take 50 mg by mouth 2 (two) times daily.    Historical Provider, MD  Multiple Vitamin (MULTIVITAMIN) tablet Take 1 tablet by mouth daily.    Historical Provider, MD  Oxycodone HCl 10 MG TABS Take 1 tablet (10 mg total) by mouth every 4 (four) hours as needed (for pain.). For severe pain. 02/18/15   Marshia LyJames Bethune, PA-C  pantoprazole (PROTONIX) 40 MG tablet Take 40 mg by mouth 2 (two) times daily.    Historical Provider, MD  QUEtiapine (SEROQUEL) 200 MG tablet Take 200 mg by mouth at bedtime.    Historical Provider, MD  tiZANidine  (ZANAFLEX) 4 MG tablet Take 1 tablet by mouth 3 (three) times daily. 11/25/14   Historical Provider, MD    Allergies  Ambien; Clonidine derivatives; and Nu-iron  Triage Vitals: BP 127/113 mmHg  Pulse 82  Temp(Src) 97.5 F (36.4 C) (Oral)  Resp 16  SpO2 95%  Physical Exam  Constitutional: He is oriented to person, place, and time. Vital signs are normal. He appears well-developed and well-nourished.  Non-toxic appearance. He does not appear ill. No distress.  HENT:  Head: Normocephalic and atraumatic.  Nose: Nose normal.  Mouth/Throat: Oropharynx is clear and moist. No oropharyngeal exudate.  Eyes: Conjunctivae and EOM are normal. Pupils are equal, round, and reactive to light. No scleral icterus.  Neck: Normal range of motion. Neck supple. No tracheal deviation, no edema, no erythema and normal range of motion present. No thyroid mass and no thyromegaly present.  Cardiovascular: Normal rate, regular rhythm, S1 normal, S2 normal, normal heart sounds, intact distal pulses and normal pulses.  Exam reveals no gallop and no friction rub.   No murmur heard. Pulmonary/Chest: Effort normal and breath sounds normal. No respiratory distress. He has no wheezes. He has no rhonchi. He has no rales.  Abdominal: Soft. Normal appearance and bowel sounds are normal. He exhibits no distension, no ascites and no mass. There is no hepatosplenomegaly. There is no tenderness. There is no rebound, no guarding and no CVA tenderness.  Musculoskeletal: He exhibits no edema or tenderness.  RLE  is shortened and internally rotated. Intact pulses in the right foot.   Lymphadenopathy:    He has no cervical adenopathy.  Neurological: He is alert and oriented to person, place, and time. He has normal strength. No cranial nerve deficit or sensory deficit.  Skin: Skin is warm, dry and intact. No petechiae and no rash noted. He is not diaphoretic. No erythema. No pallor.  Psychiatric: He has a normal mood and affect.  His behavior is normal. Judgment normal.  Nursing note and vitals reviewed.   ED Course  Procedures   DIAGNOSTIC STUDIES: Oxygen Saturation is 95% on 2L, adequate  by my interpretation.    COORDINATION OF CARE: 2:18 AM Discussed treatment plan which includes right hip XR with pt at bedside and pt agreed to plan.  Labs Reviewed - No data to display  Imaging Review Dg Hip Unilat With Pelvis 2-3 Views Right  02/22/2015  CLINICAL DATA:  Right hip popped when standing up after using bathroom. Right leg shortening. Initial encounter. EXAM: DG HIP (WITH OR WITHOUT PELVIS) 2-3V RIGHT COMPARISON:  Right hip radiographs performed 02/18/2015 FINDINGS: There is recurrent superior dislocation of the patient's right hip prosthesis. There is no evidence of fracture. There is no evidence of loosening. Cerclage wires are noted along the proximal right femur. The  left hip joint is unremarkable in appearance. The sacroiliac joints are within normal limits. The visualized bowel gas pattern is grossly unremarkable. 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 No evidence of fracture or loosening. Electronically Signed   By: Roanna Raider M.D.   On: 02/22/2015 02:57    I personally reviewed and evaluated these images as a part of my medical decision-making.     MDM   Final diagnoses:  None    Patient presents to the ED for recurrent hip dislocation.  Xrays confirms this.  It was reduced with ketamine sedation.  Will DC home with a knee immobilizer and ortho follow up.  He was retained in the ED until awake and alert.    Upon repeat assessment, patient appears well and in NAD.  VS remain within his normal limits and he is safe for DC.   Procedural sedation Performed by: Todd [name] Consent: Verbal consent obtained. Risks and benefits: risks, benefits and alternatives were discussed Required items: required blood products, implants, devices, and special  equipment available Patient identity confirmed: arm band and provided demographic data Time out: Immediately prior to procedure a "time out" was called to verify the correct patient, procedure, equipment, support staff and site/side marked as required.  Sedation type: moderate (conscious) sedation NPO time confirmed and considedered  Sedatives: KETAMINE   Physician Time at Bedside:  Vitals: Vital signs were monitored during sedation. Cardiac Monitor, pulse oximeter Patient tolerance: Patient tolerated the procedure well with no immediate complications. Comments: Pt with uneventful recovered. Returned to pre-procedural sedation baseline. Normal pulses and sensation postreduction.  Reduction of dislocation Date/Time: 3:42 AM Performed by: Todd [name] Authorized byTomasita [name] Consent: Verbal consent obtained. Risks and benefits: risks, benefits and alternatives were discussed Consent given by: patient Required items: required blood products, implants, devices, and special equipment available Time out: Immediately prior to procedure a "time out" was called to verify the correct patient, procedure, equipment, support staff and site/side marked as required.  Patient sedated: Ketamine  Vitals: Vital signs were monitored during sedation. Patient tolerance: Patient tolerated the procedure well with no immediate complications. Joint: R hip Reduction technique: Darrel Reach     I personally performed the services described in this documentation, which was scribed in my presence. The recorded information has been reviewed and is accurate.     I was called by the pharmacy to report the patient had over 200 tablets of oxycodone filled in the last month. Prescription was canceled, patient is due to get repeat oxycodone prescription from his primary care physician on December 27. I would discourage any further narcotic prescriptions until then.  Todd Crumble, MD 02/22/15 [phone]

## 2015-02-22 NOTE — ED Notes (Signed)
Per EMS, Pt stood up after using the bathroom and felt his right hip pop. Shortening noted to right leg. Pt states that this is the 13th time this has happened. Palpated BP 107 initially. EMS gave 250 of fentanyl in route.

## 2015-02-22 NOTE — Discharge Instructions (Signed)
Hip Dislocation [name], where your knee immobilizer at all times until you see orthopedic surgery. You need to make an appointment within the next 3 days. If any symptoms worsen come back to emergency department immediately. Thank you. A hip dislocation happens when your thigh bone (the ball) separates from your hip bone (the socket). Hip dislocation is an emergency. If you think you have a hip dislocation and cannot move your leg, get help right away. Do not try to move.  Your doctor will put your thigh bone back in the joint (the ball back in the socket). Sometimes this can be done without surgery. Surgery may be needed if blood vessels or nerves are damaged, or if the ball cannot be put back into the socket by hand. HOME CARE  Rest your injured joint. Do not move it.  Avoid the activity that caused your injury.  Put ice on your injured joint for 1 to 2 days or as told by your doctor.  Put ice in a plastic bag.  Place a towel between your skin and the bag.  Leave the ice on for 15 to 20 minutes, every 2 hours while you are awake.  Use crutches or a walker as told by your doctor.  Exercise your hip and leg as told by your doctor.  Only take medicines as told by your doctor. GET HELP RIGHT AWAY IF:  Your pain gets worse, not better.  You feel like your hip has dislocated again. MAKE SURE YOU:  Understand these instructions.  Will watch your condition.  Will get help right away if you are not doing well or get worse.   This information is not intended to replace advice given to you by your health care provider. Make sure you discuss any questions you have with your health care provider.   Document Released: 10/22/2010 Document Revised: 03/16/2014 Document Reviewed: 09/25/2014 Elsevier Interactive Patient Education [employer]2016 Elsevier Inc.

## 2015-02-22 NOTE — ED Notes (Signed)
Dr Mora Bellmanni Agreed to give pt prescription for hydrocodone 5mg , pt give prescription and said "I cannot take anything with tylenol in it. Dr Mora Bellmanni notified and prescription changed to 5MG  oxycodone. Pt is now pleased with prescription and waiting in room for ride.

## 2015-02-22 NOTE — Discharge Summary (Signed)
Patient ID: Todd [name] MRN: [id number] DOB/AGE: 08/21/1970 44 y.o.  Admit date: 02/17/2015 Discharge date: 02/17/2015 Admission Diagnoses:  Active Problems:   GASTROESOPHAGEAL REFLUX, NO ESOPHAGITIS   Essential hypertension   Dislocation of hip prosthesis (HCC)   Hip dislocation, right [facility])   Discharge Diagnoses:  Same  Past Medical History  Diagnosis Date  . Hypertension   . GERD (gastroesophageal reflux disease)   . H/O hiatal hernia     Surgeries: Procedure(s): CLOSED REDUCTION noneright HIP on 02/17/2015 - 02/18/2015   Consultants:    Discharged Condition: Improved  Hospital Course: Todd [name] is an 44 y.o. male who was admitted 02/17/2015 for operative treatment ofa recurrent dislocation of his right total hip replacement.the hip popped out earlier in the day.. Patient has severe unremitting pain that affects sleep, daily activities, and work/hobbies. After pre-op clearance the patient was taken to the operating room on 02/17/2015 - 02/18/2015 and underwent  Procedure(s): CLOSED REDUCTION right HIP.    Patient was given perioperative antibiotics:  Anti-infectives    None       Patient was given sequential compression devices, early ambulation, and chemoprophylaxis to prevent DVT.  Patient benefited maximally from hospital stay and there were no complications.    Recent vital signs: see chart for details   Recent laboratory studies: see chart for details  Discharge Medications:     Medication List    STOP taking these medications        cyclobenzaprine 10 MG tablet  Commonly known as:  FLEXERIL     Oxycodone HCl 10 MG Tabs      TAKE these medications        ALPRAZolam 1 MG tablet  Commonly known as:  XANAX  Take 1 mg by mouth at bedtime as needed for anxiety. *may repeat in 3-4 hrs if needed*     atorvastatin 40 MG tablet  Commonly known as:  LIPITOR  Take 40 mg by mouth daily.     ferrous fumarate 325 (106 FE) MG Tabs tablet  Commonly  known as:  HEMOCYTE - 106 mg FE  Take 1 tablet by mouth 3 (three) times daily.     FLUoxetine 40 MG capsule  Commonly known as:  PROZAC  Take 40 mg by mouth daily.     loratadine 10 MG tablet  Commonly known as:  CLARITIN  Take 10 mg by mouth daily as needed for allergies.     Melatonin 10 MG Tabs  Take 10 mg by mouth at bedtime as needed (sleep).     metoprolol 50 MG tablet  Commonly known as:  LOPRESSOR  Take 50 mg by mouth 2 (two) times daily.     multivitamin tablet  Take 1 tablet by mouth daily.     pantoprazole 40 MG tablet  Commonly known as:  PROTONIX  Take 40 mg by mouth 2 (two) times daily.     QUEtiapine 200 MG tablet  Commonly known as:  SEROQUEL  Take 200 mg by mouth at bedtime.     tiZANidine 4 MG tablet  Commonly known as:  ZANAFLEX  Take 1 tablet by mouth 3 (three) times daily.        Diagnostic Studies: Dg Chest 2 View  02/18/2015  CLINICAL DATA:  Acute onset of altered mental status. Initial encounter. EXAM: CHEST  2 VIEW COMPARISON:  Chest radiograph from 06/25/2014 FINDINGS: The lungs are well-aerated. Minimal bilateral atelectasis is noted. There is no evidence of pleural effusion or pneumothorax.  The heart is mildly enlarged. A large hiatal hernia is noted, containing fluid and air. No acute osseous abnormalities are seen. 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 IMPRESSION: 1. Minimal bilateral atelectasis noted.  Lungs otherwise clear. 2. Mild cardiomegaly. 3. Large hiatal hernia, containing fluid and air. Electronically Signed   By: Roanna RaiderJeffery  Chang M.D.   On: 02/18/2015 02:15   Ct Head Wo Contrast  02/18/2015  CLINICAL DATA:  Recurrent right hip dislocations. Altered mental status. Patient has taken pain medications. EXAM: CT HEAD WITHOUT CONTRAST TECHNIQUE: Contiguous axial images were obtained from the base of the skull through the vertex without intravenous contrast. COMPARISON:  06/25/2014 FINDINGS: Ventricles and sulci appear  symmetrical. No ventricular dilatation. No mass effect or midline shift. No abnormal extra-axial fluid collections. Gray-white matter junctions are distinct. Basal cisterns are not effaced. No evidence of acute intracranial hemorrhage. No depressed skull fractures. Visualized paranasal sinuses and mastoid air cells are not opacified. IMPRESSION: No acute intracranial abnormalities. Electronically Signed   By: Burman NievesWilliam  Stevens M.D.   On: 02/18/2015 02:02   Dg Hip Port Unilat With Pelvis 1v Right  02/22/2015  CLINICAL DATA:  Status post reduction of right hip prosthesis dislocation. Initial encounter. EXAM: DG HIP (WITH OR WITHOUT PELVIS) 1V PORT RIGHT COMPARISON:  Right hip radiographs performed earlier today at 2:20 a.m. FINDINGS: There has been successful reduction of the right hip prosthesis dislocation. No fractures are seen. There is no evidence of loosening. Cerclage wires are again noted along the proximal right femur. The left hip joint is unremarkable in appearance. The visualized soft tissues are grossly unremarkable. IMPRESSION: Successful reduction of right hip prosthesis dislocation. No evidence of fracture or loosening. Electronically Signed   By: Roanna RaiderJeffery  Chang M.D.   On: 02/22/2015 04:05   Dg Hip Port Unilat With Pelvis 1v Right  02/18/2015  CLINICAL DATA:  Postreduction dislocated right hip prosthesis. EXAM: DG HIP (WITH OR WITHOUT PELVIS) 1V PORT RIGHT COMPARISON:  02/17/2015 FINDINGS: Right total hip arthroplasty with long-stem femoral component. Femoral component is not entirely included within the field of view. Cerclage wires are present within the proximal and midshaft region of the femur. Cortical thickening and callus formation consistent with old healed fractures. Since the previous study, the femoral component is relocated within the acetabular cup. No acute fractures are demonstrated. Soft tissues are unremarkable. IMPRESSION: Interval relocation of previously dislocated right hip  arthroplasty. Electronically Signed   By: Burman NievesWilliam  Stevens M.D.   On: 02/18/2015 18:16   Dg Hip Unilat With Pelvis 2-3 Views Right  02/22/2015  CLINICAL DATA:  Right hip popped when standing up after using bathroom. Right leg shortening. Initial encounter. EXAM: DG HIP (WITH OR WITHOUT PELVIS) 2-3V RIGHT COMPARISON:  Right hip radiographs performed 02/18/2015 FINDINGS: There is recurrent superior dislocation of the patient's right hip prosthesis. There is no evidence of fracture. There is no evidence of loosening. Cerclage wires are noted along the proximal right femur. The left hip joint is unremarkable in appearance. The sacroiliac joints are within normal limits. The visualized bowel gas pattern is grossly unremarkable. 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 No evidence of fracture or loosening. Electronically Signed   By: Roanna RaiderJeffery  Chang M.D.   On: 02/22/2015 02:57   Dg Hip Unilat  With Pelvis 2-3 Views Right  02/17/2015  CLINICAL DATA:  Patient felt a pop while grafting something off of the table tonight. History of recurrent right hip dislocations. EXAM: DG HIP (WITH OR  WITHOUT PELVIS) 2-3V RIGHT COMPARISON:  11/30/2014 FINDINGS: Right total hip arthroplasty with long-stem femoral component. The femoral component is incompletely included within the field of view. Cerclage wires are present along the proximal and mid femoral shaft with cortical thickening consistent with old healed fracture deformity. There is new superior and posterior dislocation of the right femoral prosthesis from the acetabular cup. No acute fractures are demonstrated. Pelvis and visualized left hip appear intact. SI joints and symphysis pubis are not displaced. IMPRESSION: Dislocation of right hip arthroplasty. Electronically Signed   By: Burman Nieves M.D.   On: 02/17/2015 23:53    Disposition: 01-Home or Self Care      Discharge Instructions    Discharge patient    Complete  by:  As directed   Home today.     Weight bearing as tolerated    Complete by:  As directed   Wearing knee immobilizer and using a walker.  Laterality:  right  Extremity:  Lower           Follow-up Information    Follow up with Nestor Lewandowsky, MD. Schedule an appointment as soon as possible for a visit in 1 week.   Specialty:  Orthopedic Surgery   Contact information:   [address] [place] [address] [phone]        Signed: Matthew Folks 02/22/2015, 3:37 PM

## 2015-02-22 NOTE — ED Notes (Signed)
Pt verbalized understanding of d/c instructions and has no further questions. Pt stable and NAD.  

## 2015-02-22 NOTE — ED Notes (Signed)
Consent for sedation and consent for reduction signed by pt and this RN at placed at bedside. Crash cart outside room. End tidal c02 placed on patient.

## 2015-02-22 NOTE — ED Notes (Signed)
Pt states that he got a prescription for oxycodone 10mg  on Monday and today is the last day of his prescriptions and states that he is not going to see his PCP until the Monday after Christmas. Dr Mora Bellmanni notified and stated for the pt to call pcp. Pt notified and he stated "I want to speak directly to the dr" Dr Mora Bellmanni notified and is coming to speak with the patient.

## 2015-03-16 ENCOUNTER — Ambulatory Visit (HOSPITAL_COMMUNITY)
Admission: EM | Admit: 2015-03-16 | Discharge: 2015-03-17 | Disposition: A | Discharge status: Home / Self Care | Payer: Medicare Other | Attending: Emergency Medicine | Admitting: Emergency Medicine

## 2015-03-16 ENCOUNTER — Encounter (HOSPITAL_COMMUNITY): Payer: Self-pay | Admitting: Emergency Medicine

## 2015-03-16 ENCOUNTER — Emergency Department (HOSPITAL_COMMUNITY): Payer: Medicare Other

## 2015-03-16 ENCOUNTER — Emergency Department (HOSPITAL_COMMUNITY): Payer: Medicare Other | Admitting: Anesthesiology

## 2015-03-16 ENCOUNTER — Encounter (HOSPITAL_COMMUNITY)
Admission: EM | Disposition: A | Discharge status: Home / Self Care | Payer: Self-pay | Source: Home / Self Care | Attending: Emergency Medicine

## 2015-03-16 DIAGNOSIS — T84498A Other mechanical complication of other internal orthopedic devices, implants and grafts, initial encounter: Secondary | ICD-10-CM | POA: Diagnosis present

## 2015-03-16 DIAGNOSIS — Y792 Prosthetic and other implants, materials and accessory orthopedic devices associated with adverse incidents: Secondary | ICD-10-CM | POA: Insufficient documentation

## 2015-03-16 DIAGNOSIS — T148 Other injury of unspecified body region: Secondary | ICD-10-CM | POA: Diagnosis not present

## 2015-03-16 DIAGNOSIS — Z888 Allergy status to other drugs, medicaments and biological substances status: Secondary | ICD-10-CM | POA: Insufficient documentation

## 2015-03-16 DIAGNOSIS — K219 Gastro-esophageal reflux disease without esophagitis: Secondary | ICD-10-CM | POA: Diagnosis not present

## 2015-03-16 DIAGNOSIS — Z96649 Presence of unspecified artificial hip joint: Secondary | ICD-10-CM

## 2015-03-16 DIAGNOSIS — I10 Essential (primary) hypertension: Secondary | ICD-10-CM | POA: Insufficient documentation

## 2015-03-16 DIAGNOSIS — K449 Diaphragmatic hernia without obstruction or gangrene: Secondary | ICD-10-CM | POA: Diagnosis not present

## 2015-03-16 DIAGNOSIS — T84029A Dislocation of unspecified internal joint prosthesis, initial encounter: Secondary | ICD-10-CM

## 2015-03-16 DIAGNOSIS — J189 Pneumonia, unspecified organism: Secondary | ICD-10-CM | POA: Diagnosis not present

## 2015-03-16 DIAGNOSIS — S73004A Unspecified dislocation of right hip, initial encounter: Secondary | ICD-10-CM | POA: Diagnosis not present

## 2015-03-16 DIAGNOSIS — T84020A Dislocation of internal right hip prosthesis, initial encounter: Secondary | ICD-10-CM | POA: Insufficient documentation

## 2015-03-16 DIAGNOSIS — M24451 Recurrent dislocation, right hip: Secondary | ICD-10-CM | POA: Diagnosis not present

## 2015-03-16 DIAGNOSIS — Z419 Encounter for procedure for purposes other than remedying health state, unspecified: Secondary | ICD-10-CM

## 2015-03-16 HISTORY — PX: HIP CLOSED REDUCTION: SHX983

## 2015-03-16 LAB — CBC WITH DIFFERENTIAL/PLATELET
Basophils Absolute: 0 10*3/uL (ref 0.0–0.1)
Basophils Relative: 0 %
Eosinophils Absolute: 0.2 10*3/uL (ref 0.0–0.7)
Eosinophils Relative: 3 %
HCT: 27.5 % — ABNORMAL LOW (ref 39.0–52.0)
Hemoglobin: 8.4 g/dL — ABNORMAL LOW (ref 13.0–17.0)
Lymphocytes Relative: 11 %
Lymphs Abs: 0.7 10*3/uL (ref 0.7–4.0)
MCH: 24.7 pg — ABNORMAL LOW (ref 26.0–34.0)
MCHC: 30.5 g/dL (ref 30.0–36.0)
MCV: 80.9 fL (ref 78.0–100.0)
Monocytes Absolute: 0.9 10*3/uL (ref 0.1–1.0)
Monocytes Relative: 13 %
Neutro Abs: 5.1 10*3/uL (ref 1.7–7.7)
Neutrophils Relative %: 74 %
Platelets: 306 10*3/uL (ref 150–400)
RBC: 3.4 MIL/uL — ABNORMAL LOW (ref 4.22–5.81)
RDW: 13.7 % (ref 11.5–15.5)
WBC: 6.9 10*3/uL (ref 4.0–10.5)

## 2015-03-16 LAB — I-STAT CHEM 8, ED
BUN: 29 mg/dL — ABNORMAL HIGH (ref 6–20)
Calcium, Ion: 1.23 mmol/L (ref 1.12–1.23)
Chloride: 103 mmol/L (ref 101–111)
Creatinine, Ser: 1.2 mg/dL (ref 0.61–1.24)
Glucose, Bld: 125 mg/dL — ABNORMAL HIGH (ref 65–99)
HCT: 27 % — ABNORMAL LOW (ref 39.0–52.0)
Hemoglobin: 9.2 g/dL — ABNORMAL LOW (ref 13.0–17.0)
Potassium: 4.1 mmol/L (ref 3.5–5.1)
Sodium: 144 mmol/L (ref 135–145)
TCO2: 27 mmol/L (ref 0–100)

## 2015-03-16 LAB — ETHANOL: Alcohol, Ethyl (B): <5 mg/dL (ref <5)

## 2015-03-16 SURGERY — CLOSED REDUCTION, HIP
Anesthesia: General | Site: Hip | Laterality: Right

## 2015-03-16 MED ORDER — METOPROLOL TARTRATE 50 MG PO TABS: 50.0000 mg | ORAL_TABLET | Freq: Two times a day (BID) | ORAL | Status: DC

## 2015-03-16 MED ORDER — ONDANSETRON HCL 4 MG PO TABS: 4.0000 mg | ORAL_TABLET | Freq: Four times a day (QID) | ORAL | Status: DC | PRN

## 2015-03-16 MED ORDER — MELATONIN 3 MG PO TABS
9.0000 mg | ORAL_TABLET | Freq: Every evening | ORAL | Status: DC | PRN
  Filled 2015-03-16: qty 3

## 2015-03-16 MED ORDER — LIDOCAINE HCL (CARDIAC) 20 MG/ML IV SOLN
INTRAVENOUS | Status: AC
  Filled 2015-03-16: qty 5

## 2015-03-16 MED ORDER — PANTOPRAZOLE SODIUM 40 MG PO TBEC
40.0000 mg | DELAYED_RELEASE_TABLET | Freq: Two times a day (BID) | ORAL | Status: DC
  Administered 2015-03-17: 40 mg via ORAL
  Filled 2015-03-16: qty 1

## 2015-03-16 MED ORDER — PROMETHAZINE HCL 25 MG/ML IJ SOLN: 6.2500 mg | INTRAMUSCULAR | Status: DC | PRN

## 2015-03-16 MED ORDER — ATORVASTATIN CALCIUM 40 MG PO TABS: 40.0000 mg | ORAL_TABLET | Freq: Every day | ORAL | Status: DC

## 2015-03-16 MED ORDER — METOCLOPRAMIDE HCL 5 MG/ML IJ SOLN: 5.0000 mg | Freq: Three times a day (TID) | INTRAMUSCULAR | Status: DC | PRN

## 2015-03-16 MED ORDER — SUCCINYLCHOLINE CHLORIDE 20 MG/ML IJ SOLN
INTRAMUSCULAR | Status: DC | PRN
  Administered 2015-03-16: 100 mg via INTRAVENOUS

## 2015-03-16 MED ORDER — FENTANYL CITRATE (PF) 250 MCG/5ML IJ SOLN
INTRAMUSCULAR | Status: DC | PRN
  Administered 2015-03-16: 50 ug via INTRAVENOUS

## 2015-03-16 MED ORDER — METHOCARBAMOL 1000 MG/10ML IJ SOLN
500.0000 mg | Freq: Four times a day (QID) | INTRAVENOUS | Status: DC | PRN
  Filled 2015-03-16: qty 5

## 2015-03-16 MED ORDER — MELATONIN 10 MG PO TABS: 10.0000 mg | ORAL_TABLET | Freq: Every evening | ORAL | Status: DC | PRN

## 2015-03-16 MED ORDER — DIPHENHYDRAMINE HCL 12.5 MG/5ML PO ELIX: 12.5000 mg | ORAL_SOLUTION | ORAL | Status: DC | PRN

## 2015-03-16 MED ORDER — FERROUS FUMARATE 325 (106 FE) MG PO TABS
1.0000 | ORAL_TABLET | Freq: Three times a day (TID) | ORAL | Status: DC
  Filled 2015-03-16 (×4): qty 1

## 2015-03-16 MED ORDER — FENTANYL CITRATE (PF) 250 MCG/5ML IJ SOLN
INTRAMUSCULAR | Status: AC
  Filled 2015-03-16: qty 5

## 2015-03-16 MED ORDER — LIDOCAINE HCL (CARDIAC) 20 MG/ML IV SOLN
INTRAVENOUS | Status: DC | PRN
  Administered 2015-03-16: 80 mg via INTRATRACHEAL

## 2015-03-16 MED ORDER — METHOCARBAMOL 500 MG PO TABS
500.0000 mg | ORAL_TABLET | Freq: Four times a day (QID) | ORAL | Status: DC | PRN
  Administered 2015-03-17: 500 mg via ORAL
  Filled 2015-03-16: qty 1

## 2015-03-16 MED ORDER — MIDAZOLAM HCL 2 MG/2ML IJ SOLN
INTRAMUSCULAR | Status: AC
  Filled 2015-03-16: qty 2

## 2015-03-16 MED ORDER — ALPRAZOLAM 0.5 MG PO TABS: 1.0000 mg | ORAL_TABLET | Freq: Every evening | ORAL | Status: DC | PRN

## 2015-03-16 MED ORDER — ONDANSETRON HCL 4 MG/2ML IJ SOLN: 4.0000 mg | Freq: Four times a day (QID) | INTRAMUSCULAR | Status: DC | PRN

## 2015-03-16 MED ORDER — HYDROMORPHONE HCL 1 MG/ML IJ SOLN: 0.2500 mg | INTRAMUSCULAR | Status: DC | PRN

## 2015-03-16 MED ORDER — PROPOFOL 10 MG/ML IV BOLUS
INTRAVENOUS | Status: DC | PRN
  Administered 2015-03-16: 180 mg via INTRAVENOUS

## 2015-03-16 MED ORDER — PHENYLEPHRINE HCL 10 MG/ML IJ SOLN
INTRAMUSCULAR | Status: DC | PRN
  Administered 2015-03-16 (×2): 80 ug via INTRAVENOUS

## 2015-03-16 MED ORDER — SODIUM CHLORIDE 0.9 % IV SOLN: INTRAVENOUS | Status: DC

## 2015-03-16 MED ORDER — QUETIAPINE FUMARATE 400 MG PO TABS: 200.0000 mg | ORAL_TABLET | Freq: Every day | ORAL | Status: DC

## 2015-03-16 MED ORDER — FLUOXETINE HCL 20 MG PO CAPS: 40.0000 mg | ORAL_CAPSULE | Freq: Every day | ORAL | Status: DC

## 2015-03-16 MED ORDER — TIZANIDINE HCL 4 MG PO TABS
4.0000 mg | ORAL_TABLET | Freq: Three times a day (TID) | ORAL | Status: DC
  Administered 2015-03-17: 4 mg via ORAL
  Filled 2015-03-16: qty 1

## 2015-03-16 MED ORDER — LORATADINE 10 MG PO TABS: 10.0000 mg | ORAL_TABLET | Freq: Every day | ORAL | Status: DC | PRN

## 2015-03-16 MED ORDER — ADULT MULTIVITAMIN W/MINERALS CH: 1.0000 | ORAL_TABLET | Freq: Every day | ORAL | Status: DC

## 2015-03-16 MED ORDER — OXYCODONE HCL 5 MG PO TABS
5.0000 mg | ORAL_TABLET | ORAL | Status: DC | PRN
  Administered 2015-03-17: 5 mg via ORAL
  Administered 2015-03-17: 10 mg via ORAL
  Filled 2015-03-16: qty 1
  Filled 2015-03-16: qty 2

## 2015-03-16 MED ORDER — METOCLOPRAMIDE HCL 5 MG PO TABS: 5.0000 mg | ORAL_TABLET | Freq: Three times a day (TID) | ORAL | Status: DC | PRN

## 2015-03-16 MED ORDER — PROPOFOL 10 MG/ML IV BOLUS
INTRAVENOUS | Status: AC
  Filled 2015-03-16: qty 20

## 2015-03-16 NOTE — ED Notes (Signed)
OR called and ready for pt in short stay 36. This RN taking pt up to OR

## 2015-03-16 NOTE — Anesthesia Procedure Notes (Signed)
Procedure Name: Intubation Date/Time: 03/16/2015 9:17 PM Performed by: Molli HazardGORDON, Christophr Calix M Pre-anesthesia Checklist: Patient identified, Emergency Drugs available, Suction available and Patient being monitored Patient Re-evaluated:Patient Re-evaluated prior to inductionOxygen Delivery Method: Circle system utilized Preoxygenation: Pre-oxygenation with 100% oxygen Intubation Type: IV induction, Rapid sequence and Cricoid Pressure applied Laryngoscope Size: Miller and 2 Grade View: Grade I Tube type: Oral Tube size: 7.5 mm Number of attempts: 1 Airway Equipment and Method: Stylet Placement Confirmation: ETT inserted through vocal cords under direct vision,  positive ETCO2 and breath sounds checked- equal and bilateral Secured at: 23 cm Tube secured with: Tape Dental Injury: Teeth and Oropharynx as per pre-operative assessment

## 2015-03-16 NOTE — ED Provider Notes (Signed)
  Physical Exam  BP 110/65 mmHg  Pulse 79  Temp(Src) 97.7 F (36.5 C)  Resp 16  SpO2 97%  Physical Exam  ED Course  Procedures  MDM Patient presents with dislocated hip prosthesis. History of same. During my examination he would fall asleep while was talking to him. He has a history of chronic narcotic use for chronic pain. Has had sedation issues in the past. About a month ago required admission to the hospital before he could get his hip reduced. I discussed with Dr. Magnus IvanBlackman. Will take to the OR for reduction. Nothing by mouth since 4:00.      Benjiman CoreNathan Benji Poynter, MD 03/16/15 [phone]

## 2015-03-16 NOTE — ED Notes (Signed)
Pt is very sluggish with speech but is alert and oriented x 4,

## 2015-03-16 NOTE — Transfer of Care (Signed)
Immediate Anesthesia Transfer of Care Note  Patient: Todd HightJohn D [name]  Procedure(s) Performed: Procedure(s): CLOSED REDUCTION HIP (Right)  Patient Location: PACU  Anesthesia Type:General  Level of Consciousness: sedated and responds to stimulation  Airway & Oxygen Therapy: Patient connected to face mask oxygen  Post-op Assessment: Report given to RN and Post -op Vital signs reviewed and stable  Post vital signs: Reviewed and stable  Last Vitals:  Filed Vitals:   03/16/15 2222 03/16/15 2237  BP: 110/64 112/65  Pulse: 77 78  Temp:  36.5 C  Resp: 15 14    Complications: No apparent anesthesia complications

## 2015-03-16 NOTE — ED Notes (Signed)
Per EMS:  Pt was putting on a sock on dislocated his right hip.  Pt has a hx of dislocation to this hip multiple times including repair surgeries.  Pt states his hip is "metal on metal".  Pt has multiple home pain medications.  When EMS arrived pt states pain 8/10 but able to fall asleep in truck with pulse ox dropping below 90%.  Pt has slurred speech and falling asleep in room.  Pulse Ox 98%

## 2015-03-16 NOTE — H&P (Signed)
Todd [name] is an 45 y.o. male.   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  Was placed by Dr. Elita Quick years ago and had a recent dislocation again that was reduced by Dr. Luiz Blare.  He presented to the ED again today having moved wrong and once again dislocated his right hip.  Past Medical History  Diagnosis Date  . Hypertension   . GERD (gastroesophageal reflux disease)   . H/O hiatal hernia     Past Surgical History  Procedure Laterality Date  . Appendectomy      as child  . Joint replacement  2005    Right Hip  . Esophagogastroduodenoscopy (egd) with propofol  02/17/2012    Procedure: ESOPHAGOGASTRODUODENOSCOPY (EGD) WITH PROPOFOL;  Surgeon: Willis Modena, MD;  Location: [facility]OSCOPY;  Service: Endoscopy;  Laterality: N/A;  . Colonoscopy with propofol  02/17/2012    Procedure: COLONOSCOPY WITH PROPOFOL;  Surgeon: Willis Modena, MD;  Location: [facility] ENDOSCOPY;  Service: Endoscopy;  Laterality: N/A;  . Esophagogastroduodenoscopy Left 09/19/2013    Procedure: ESOPHAGOGASTRODUODENOSCOPY (EGD);  Surgeon: Willis Modena, MD;  Location: [facility] ENDOSCOPY;  Service: Endoscopy;  Laterality: Left;  . Hip closed reduction Right 10/20/2013    Procedure: CLOSED REDUCTION HIP;  Surgeon: Eldred Manges, MD;  Location: [facility] OR;  Service: Orthopedics;  Laterality: Right;  . Hip closed reduction Right 02/18/2015    Procedure: CLOSED REDUCTION HIP;  Surgeon: Jodi Geralds, MD;  Location: [facility] ORS;  Service: Orthopedics;  Laterality: Right;    Family History  Problem Relation Age of Onset  . Diabetes Mother    Social History:  reports that he has never smoked. He has never used smokeless tobacco. He reports that he drinks about 2.4 oz of alcohol per week. He reports that he does not use illicit drugs.  Allergies:  Allergies  Allergen Reactions  . Ambien [Zolpidem Tartrate] Other (See Comments)    sedation  . Clonidine Derivatives Other (See Comments)   Caused Trudie Buckler syndrome  . Nu-Iron [Polysaccharide Iron Complex] Diarrhea     (Not in a hospital admission)  Results for orders placed or performed during the hospital encounter of 03/16/15 (from the past 48 hour(s))  I-stat chem 8, ed     Status: Abnormal   Collection Time: 03/16/15  7:43 PM  Result Value Ref Range   Sodium 144 135 - 145 mmol/L   Potassium 4.1 3.5 - 5.1 mmol/L   Chloride 103 101 - 111 mmol/L   BUN 29 (H) 6 - 20 mg/dL   Creatinine, Ser 1.61 0.61 - 1.24 mg/dL   Glucose, Bld 096 (H) 65 - 99 mg/dL   Calcium, Ion 0.45 4.09 - 1.23 mmol/L   TCO2 27 0 - 100 mmol/L   Hemoglobin 9.2 (L) 13.0 - 17.0 g/dL   HCT 81.1 (L) 91.4 - 78.2 %  CBC with Differential     Status: Abnormal   Collection Time: 03/16/15  7:50 PM  Result Value Ref Range   WBC 6.9 4.0 - 10.5 K/uL   RBC 3.40 (L) 4.22 - 5.81 MIL/uL   Hemoglobin 8.4 (L) 13.0 - 17.0 g/dL   HCT 95.6 (L) 21.3 - 08.6 %   MCV 80.9 78.0 - 100.0 fL   MCH 24.7 (L) 26.0 - 34.0 pg   MCHC 30.5 30.0 - 36.0 g/dL   RDW 57.8 46.9 - 62.9 %   Platelets 306 150 - 400 K/uL   Neutrophils Relative %  74 %   Neutro Abs 5.1 1.7 - 7.7 K/uL   Lymphocytes Relative 11 %   Lymphs Abs 0.7 0.7 - 4.0 K/uL   Monocytes Relative 13 %   Monocytes Absolute 0.9 0.1 - 1.0 K/uL   Eosinophils Relative 3 %   Eosinophils Absolute 0.2 0.0 - 0.7 K/uL   Basophils Relative 0 %   Basophils Absolute 0.0 0.0 - 0.1 K/uL  Ethanol     Status: None   Collection Time: 03/16/15  7:50 PM  Result Value Ref Range   Alcohol, Ethyl (B) <5 <5 mg/dL    Comment:        LOWEST DETECTABLE LIMIT FOR SERUM ALCOHOL IS 5 mg/dL FOR MEDICAL PURPOSES ONLY    Dg Hip Unilat With Pelvis 2-3 Views Right  03/16/2015  CLINICAL DATA:  Right hip dislocation. EXAM: DG HIP (WITH OR WITHOUT PELVIS) 2-3V RIGHT COMPARISON:  02/22/2015 FINDINGS: There has been a long stem femoral component total right hip arthroplasty. There is a superior lateral and posterior dislocation of the right  humeral prosthetic head on the prosthetic acetabulum. There is superior displacement of the femur with associated varus deformity. There is no evidence of fracture. Osseous mineralization is normal.  Soft tissues are grossly normal. IMPRESSION: Superior lateral dislocation of the right humeral prosthetic head on the prosthetic acetabulum, without evidence of fracture. Electronically Signed   By: Ted Mcalpineobrinka  Dimitrova M.D.   On: 03/16/2015 19:16    Review of Systems  Musculoskeletal: Positive for joint pain.  All other systems reviewed and are negative.   Blood pressure 110/65, pulse 79, temperature 97.7 F (36.5 C), resp. rate 16, SpO2 97 %. Physical Exam  Constitutional: He is oriented to person, place, and time. He appears well-developed and well-nourished.  HENT:  Head: Normocephalic and atraumatic.  Eyes: EOM are normal. Pupils are equal, round, and reactive to light.  Neck: Normal range of motion. Neck supple.  Cardiovascular: Normal rate and regular rhythm.   Respiratory: Effort normal and breath sounds normal.  GI: Soft. Bowel sounds are normal.  Musculoskeletal:       Right hip: He exhibits deformity.  Neurological: He is alert and oriented to person, place, and time.  Skin: Skin is warm and dry.  Psychiatric: He has a normal mood and affect.     Assessment/Plan Right hip prosthetic joint dislocation 1)  To the OR or a closed reduction under anesthesia.  Philisha Weinel Y 03/16/2015, 8:55 PM

## 2015-03-16 NOTE — ED Notes (Signed)
Consulted with Dr Rubin PayorPickering about UDS and whether to in and out cath or not due to pt's inability to urinate, and Dr Rubin PayorPickering stated that it is not necessary to in and out cath.

## 2015-03-16 NOTE — Brief Op Note (Signed)
03/16/2015  9:34 PM  PATIENT:  Todd [name]  45 y.o. male  PRE-OPERATIVE DIAGNOSIS:  dislocated right hip  POST-OPERATIVE DIAGNOSIS:  dislocated right hip  PROCEDURE:  Procedure(s): CLOSED REDUCTION HIP (Right)  SURGEON:  Surgeon(s) and Role:    * Kathryne Hitchhristopher Y Nalda Shackleford, MD - Primary  ANESTHESIA:   general  DICTATION: .Other Dictation: Dictation Number [id number]  PLAN OF CARE: Admit for overnight observation  PATIENT DISPOSITION:  PACU - hemodynamically stable.   Delay start of Pharmacological VTE agent (>24hrs) due to surgical blood loss or risk of bleeding: not applicable

## 2015-03-16 NOTE — Anesthesia Preprocedure Evaluation (Addendum)
Anesthesia Evaluation  Patient identified by MRN, date of birth, ID band Patient awake    Reviewed: Allergy & Precautions, NPO status , Patient's Chart, lab work & pertinent test results  Airway Mallampati: II  TM Distance: >3 FB Neck ROM: Full    Dental   Pulmonary pneumonia,    breath sounds clear to auscultation       Cardiovascular hypertension,  Rhythm:Regular Rate:Normal     Neuro/Psych    GI/Hepatic Neg liver ROS, hiatal hernia, GERD  ,  Endo/Other  negative endocrine ROS  Renal/GU negative Renal ROS     Musculoskeletal   Abdominal   Peds  Hematology   Anesthesia Other Findings   Reproductive/Obstetrics                            Anesthesia Physical Anesthesia Plan  ASA: III  Anesthesia Plan: General   Post-op Pain Management:    Induction: Intravenous, Rapid sequence and Cricoid pressure planned  Airway Management Planned: Oral ETT  Additional Equipment:   Intra-op Plan:   Post-operative Plan: Extubation in OR  Informed Consent: I have reviewed the patients History and Physical, chart, labs and discussed the procedure including the risks, benefits and alternatives for the proposed anesthesia with the patient or authorized representative who has indicated his/her understanding and acceptance.   Dental advisory given  Plan Discussed with: CRNA, Anesthesiologist and Surgeon  Anesthesia Plan Comments:         Anesthesia Quick Evaluation

## 2015-03-16 NOTE — ED Provider Notes (Signed)
CSN: [id number]     Arrival date & time 03/16/15  1824 History   First MD Initiated Contact with Patient 03/16/15 1825     Chief Complaint  Patient presents with  . Hip Pain     (Consider location/radiation/quality/duration/timing/severity/associated sxs/prior Treatment) The history is provided by the patient and medical records.   45 y.o. M with hx of HTN, GERD, right hip replacement with multiple prior hip dislocations, presenting to the ED for right hip pain.  Patient states he was reaching down to put on socks and felt a "pop" in his right hip.  He states he did not fall to the ground.  Denies head injury or LOC.  Patient states he feels that his right hip is dislocated again.  Denies numbness/weakness of right leg.  No other injuries reported.  Last PO intake 1500.  Past Medical History  Diagnosis Date  . Hypertension   . GERD (gastroesophageal reflux disease)   . H/O hiatal hernia    Past Surgical History  Procedure Laterality Date  . Appendectomy      as child  . Joint replacement  2005    Right Hip  . Esophagogastroduodenoscopy (egd) with propofol  02/17/2012    Procedure: ESOPHAGOGASTRODUODENOSCOPY (EGD) WITH PROPOFOL;  Surgeon: Willis Modena, MD;  Location: [facility] ENDOSCOPY;  Service: Endoscopy;  Laterality: N/A;  . Colonoscopy with propofol  02/17/2012    Procedure: COLONOSCOPY WITH PROPOFOL;  Surgeon: Willis Modena, MD;  Location: [facility] ENDOSCOPY;  Service: Endoscopy;  Laterality: N/A;  . Esophagogastroduodenoscopy Left 09/19/2013    Procedure: ESOPHAGOGASTRODUODENOSCOPY (EGD);  Surgeon: Willis Modena, MD;  Location: [facility] ENDOSCOPY;  Service: Endoscopy;  Laterality: Left;  . Hip closed reduction Right 10/20/2013    Procedure: CLOSED REDUCTION HIP;  Surgeon: Eldred Manges, MD;  Location: [facility] OR;  Service: Orthopedics;  Laterality: Right;  . Hip closed reduction Right 02/18/2015    Procedure: CLOSED REDUCTION HIP;  Surgeon: Jodi Geralds, MD;  Location: [facility] ORS;  Service: Orthopedics;   Laterality: Right;   Family History  Problem Relation Age of Onset  . Diabetes Mother    Social History  Substance Use Topics  . Smoking status: Never Smoker   . Smokeless tobacco: Never Used  . Alcohol Use: 2.4 oz/week    4 Cans of beer per week     Comment: social    Review of Systems  Musculoskeletal: Positive for arthralgias.  All other systems reviewed and are negative.     Allergies  Ambien; Clonidine derivatives; and Nu-iron  Home Medications   Prior to Admission medications   Medication Sig Start Date End Date Taking? Authorizing Provider  ALPRAZolam Prudy Feeler) 1 MG tablet Take 1 mg by mouth at bedtime as needed for anxiety. *may repeat in 3-4 hrs if needed*    Historical Provider, MD  atorvastatin (LIPITOR) 40 MG tablet Take 40 mg by mouth daily.    Historical Provider, MD  ferrous fumarate (HEMOCYTE - 106 MG FE) 325 (106 FE) MG TABS tablet Take 1 tablet by mouth 3 (three) times daily.     Historical Provider, MD  FLUoxetine (PROZAC) 40 MG capsule Take 40 mg by mouth daily.    Historical Provider, MD  loratadine (CLARITIN) 10 MG tablet Take 10 mg by mouth daily as needed for allergies.     Historical Provider, MD  Melatonin 10 MG TABS Take 10 mg by mouth at bedtime as needed (sleep).     Historical Provider, MD  metoprolol (LOPRESSOR) 50 MG tablet Take  50 mg by mouth 2 (two) times daily.    Historical Provider, MD  Multiple Vitamin (MULTIVITAMIN) tablet Take 1 tablet by mouth daily.    Historical Provider, MD  oxyCODONE (ROXICODONE) 5 MG immediate release tablet Take 1 tablet (5 mg total) by mouth 2 (two) times daily as needed for severe pain. 02/22/15   [name], MD  pantoprazole (PROTONIX) 40 MG tablet Take 40 mg by mouth 2 (two) times daily.    Historical Provider, MD  QUEtiapine (SEROQUEL) 200 MG tablet Take 200 mg by mouth at bedtime.    Historical Provider, MD  tiZANidine (ZANAFLEX) 4 MG tablet Take 1 tablet by mouth 3 (three) times daily. 11/25/14   Historical  Provider, MD   BP 108/70 mmHg  Pulse 78  Temp(Src) 97.7 F (36.5 C)  Resp 16  SpO2 99%   Physical Exam  Constitutional: He is oriented to person, place, and time. He appears well-developed and well-nourished. No distress.  Appears very drowsy, intermittently falling asleep during exam  HENT:  Head: Normocephalic and atraumatic.  Mouth/Throat: Oropharynx is clear and moist.  Eyes: Conjunctivae and EOM are normal. Pupils are equal, round, and reactive to light.  Neck: Normal range of motion. Neck supple.  Cardiovascular: Normal rate, regular rhythm and normal heart sounds.   Pulmonary/Chest: Effort normal and breath sounds normal. No respiratory distress. He has no wheezes.  Musculoskeletal: Normal range of motion.  Right leg appears shortening and is internally rotated; reports pain of right hip, however no tenderness elicited on exam; leg is NVI  Neurological: He is alert and oriented to person, place, and time.  Skin: Skin is warm and dry. He is not diaphoretic.  Psychiatric: He has a normal mood and affect.  Nursing note and vitals reviewed.   ED Course  Procedures (including critical care time) Labs Review Labs Reviewed  I-STAT CHEM 8, ED - Abnormal; Notable for the following:    BUN 29 (*)    Glucose, Bld 125 (*)    Hemoglobin 9.2 (*)    HCT 27.0 (*)    All other components within normal limits  CBC WITH DIFFERENTIAL/PLATELET  ETHANOL  URINE RAPID DRUG SCREEN, HOSP PERFORMED    Imaging Review Dg Hip Unilat With Pelvis 2-3 Views Right  03/16/2015  CLINICAL DATA:  Right hip dislocation. EXAM: DG HIP (WITH OR WITHOUT PELVIS) 2-3V RIGHT COMPARISON:  02/22/2015 FINDINGS: There has been a long stem femoral component total right hip arthroplasty. There is a superior lateral and posterior dislocation of the right humeral prosthetic head on the prosthetic acetabulum. There is superior displacement of the femur with associated varus deformity. There is no evidence of fracture.  Osseous mineralization is normal.  Soft tissues are grossly normal. IMPRESSION: Superior lateral dislocation of the right humeral prosthetic head on the prosthetic acetabulum, without evidence of fracture. Electronically Signed   By: Ted Mcalpine M.D.   On: 03/16/2015 19:16   I have personally reviewed and evaluated these images and lab results as part of my medical decision-making.   EKG Interpretation None      MDM   Final diagnoses:  Hip dislocation, right, initial encounter [facility])   45 y.o. M here with right hip pain and concern for recurrent dislocation.  He states hx of 13+ of hip dislocations in the past.  Patient appears drowsy and speech is slurred.  Patient is currently on multiple home pain medications as well as xanax, prozac, and seroquel which may be contributing to his drowsiness  as well.    X-ray does confirm superior lateral dislocation of right humeral head.  Patient remains very drowsy and continues falling asleep in the middle of conversation.  He has presented like this several times in the past.  Do not feel it is safe to sedate in his current state.  Will speak with orthopedics regarding reduction as this has been performed in the OR several times in the past.  Lab work also sent in case patient requires admission.  7:52 PM Care signed out to Dr. Rubin PayorPickering for disposition.  Garlon HatchetLisa M Petronella Shuford, PA-C 03/16/15 1952  Benjiman CoreNathan Pickering, MD 03/16/15 [phone]

## 2015-03-16 NOTE — Op Note (Signed)
NAME:  Everardo PacificKENNEY, Corde                 ACCOUNT NO.:  [id number]  MEDICAL RECORD NO.:  [id number]  LOCATION:  [facility]                         FACILITY:  [facility]  PHYSICIAN:  Vanita PandaChristopher Y. Magnus IvanBlackman, M.D.DATE OF BIRTH:  18-Nov-1970  DATE OF PROCEDURE:  03/16/2015 DATE OF DISCHARGE:                              OPERATIVE REPORT   INDICATIONS:  [name] is a 45 year old gentleman who is a patient of [facility] originally and had a hip revision surgery by Dr. Turner Danielsowan many years ago.  He since been a chronic dislocator and most recently dislocated less than a month ago on February 18, 2015.  Dr. Luiz BlareGraves took him to the operating room for closed reduction under anesthesia.  He was at home, he has had putting on his socks.  When his hip dislocated again, he was seen in the ER.  I am actually on-call for undersigned call for that group and I was called to see if I could take care of him in terms of reducing his hip in the operating room.  The ER doctors were not wanting to do this given the amount of sedation he was required in the past and I agree with this being he is a chronic dislocator and likely has some drug-seeking behavior as well.  He has asked about revision surgery at some point according to Dr. Luiz BlareGraves and noted that he was just to follow back up with their office to see Dr. Turner Danielsowan.  So, not sure what was going on from that standpoint, along we know is that his hip is definitely dislocated, x-rays confirmed this. His leg is internally rotated and shortened.  He is having significant amount of pain to warned a reduction.  I have explained the risks and benefits of this and he does wish to proceed.  PROCEDURE DESCRIPTION:  After informed consent was obtained, appropriate right leg was marked.  He was brought to the operating room and placed supine on the operating table.  General anesthesia was obtained.  A time- out was called and he was identified as correct patient and  correct right hip.  We then were able to do some fine manipulation and reduced the hip.  This was confirmed under direct fluoroscopy and we put a hip abduction pillow between his legs having normal leg lengths again.  He was then awakened, extubated and taken to the recovery room in stable condition.     Vanita Pandahristopher Y. Magnus IvanBlackman, M.D.     CYB/MEDQ  D:  03/16/2015  T:  03/16/2015  Job:  [id number]

## 2015-03-16 NOTE — ED Notes (Signed)
This RN in with Debbie NT to assist pt to urinate in urinal.

## 2015-03-17 NOTE — Progress Notes (Signed)
Patient ID: Todd HightJohn D [name], male   DOB: 07/06/1970, 45 y.o.   MRN: [id number] The patient left this morning after removing his IV himself.  Apparently he could not wait for me to get out of surgery this am to come and discharge him.  He actually had me paged thru the answering service to his cell phone directly while I was in surgery and the OR circulator explained that I was in surgery and would be up soon.  He hung up on her and when I came upstairs to see him he was gone.  Nursing on the floor said he removed his IV and said he needed to pick up something and then just left.  He needs to follow-up with Dr. Marchia Meiersowen's office [facility]( [facility]) due to his recurrent hip dislocations.  He is not a primary patient of mine at all.

## 2015-03-17 NOTE — Progress Notes (Signed)
PT Cancellation Note  Patient Details Name: Todd [name] MRN: [id number] DOB: 10/13/1970   Cancelled Treatment:    Reason Eval/Treat Not Completed: Other (comment) (pt left AMA, see MD note)   Dennis BastMartin, Henli Hey Galloway 03/17/2015, 11:31 AM

## 2015-03-17 NOTE — Discharge Summary (Signed)
  The patient left AMA.  He had his right prosthetic hip dislocation reduced in the OR last evening and then he left on his own this am.  He is a patient of [facility] and had his original hip surgery by Dr. Elita Quickowen.

## 2015-03-17 NOTE — Progress Notes (Addendum)
Received phone call from patient twice. He requested to discharge and mentioned no MD follow up with him after surgery so far. While I was paging MD, patient left floor against AMA. Notified on call MD right away. Patient unhooked the IV fluid and removed the IV catheter by himself prior leaving.

## 2015-03-18 ENCOUNTER — Encounter (HOSPITAL_COMMUNITY): Payer: Self-pay | Admitting: Orthopaedic Surgery

## 2015-03-18 NOTE — Anesthesia Postprocedure Evaluation (Signed)
Anesthesia Post Note  Patient: Todd [name]  Procedure(s) Performed: Procedure(s) (LRB): CLOSED REDUCTION HIP (Right)  Patient location during evaluation: PACU Anesthesia Type: General Level of consciousness: awake Pain management: pain level controlled Vital Signs Assessment: post-procedure vital signs reviewed and stable Respiratory status: spontaneous breathing Cardiovascular status: stable Anesthetic complications: no    Last Vitals:  Filed Vitals:   03/17/15 0124 03/17/15 0508  BP: 111/48 103/50  Pulse: 78 78  Temp: 36.9 C 37.1 C  Resp: 14 15    Last Pain:  Filed Vitals:   03/17/15 0943  PainSc: Asleep                 EDWARDS,Anshul Meddings

## 2015-03-23 ENCOUNTER — Emergency Department (HOSPITAL_COMMUNITY): Payer: Medicare Other

## 2015-03-23 ENCOUNTER — Emergency Department (HOSPITAL_COMMUNITY)
Admission: EM | Admit: 2015-03-23 | Discharge: 2015-03-23 | Disposition: A | Discharge status: Home / Self Care | Payer: Medicare Other | Attending: Emergency Medicine | Admitting: Emergency Medicine

## 2015-03-23 ENCOUNTER — Encounter (HOSPITAL_COMMUNITY): Payer: Self-pay | Admitting: Emergency Medicine

## 2015-03-23 DIAGNOSIS — Y9389 Activity, other specified: Secondary | ICD-10-CM | POA: Insufficient documentation

## 2015-03-23 DIAGNOSIS — I10 Essential (primary) hypertension: Secondary | ICD-10-CM | POA: Diagnosis not present

## 2015-03-23 DIAGNOSIS — S73004A Unspecified dislocation of right hip, initial encounter: Secondary | ICD-10-CM

## 2015-03-23 DIAGNOSIS — K219 Gastro-esophageal reflux disease without esophagitis: Secondary | ICD-10-CM | POA: Insufficient documentation

## 2015-03-23 DIAGNOSIS — Z4789 Encounter for other orthopedic aftercare: Secondary | ICD-10-CM | POA: Diagnosis not present

## 2015-03-23 DIAGNOSIS — W01198A Fall on same level from slipping, tripping and stumbling with subsequent striking against other object, initial encounter: Secondary | ICD-10-CM | POA: Insufficient documentation

## 2015-03-23 DIAGNOSIS — Z79899 Other long term (current) drug therapy: Secondary | ICD-10-CM | POA: Insufficient documentation

## 2015-03-23 DIAGNOSIS — Z471 Aftercare following joint replacement surgery: Secondary | ICD-10-CM | POA: Diagnosis not present

## 2015-03-23 DIAGNOSIS — T148 Other injury of unspecified body region: Secondary | ICD-10-CM | POA: Diagnosis not present

## 2015-03-23 DIAGNOSIS — T84020A Dislocation of internal right hip prosthesis, initial encounter: Secondary | ICD-10-CM | POA: Insufficient documentation

## 2015-03-23 DIAGNOSIS — Y792 Prosthetic and other implants, materials and accessory orthopedic devices associated with adverse incidents: Secondary | ICD-10-CM | POA: Insufficient documentation

## 2015-03-23 DIAGNOSIS — Y998 Other external cause status: Secondary | ICD-10-CM | POA: Diagnosis not present

## 2015-03-23 DIAGNOSIS — Y92003 Bedroom of unspecified non-institutional (private) residence as the place of occurrence of the external cause: Secondary | ICD-10-CM | POA: Diagnosis not present

## 2015-03-23 DIAGNOSIS — Z96641 Presence of right artificial hip joint: Secondary | ICD-10-CM | POA: Diagnosis not present

## 2015-03-23 DIAGNOSIS — S79911A Unspecified injury of right hip, initial encounter: Secondary | ICD-10-CM | POA: Diagnosis present

## 2015-03-23 DIAGNOSIS — M79604 Pain in right leg: Secondary | ICD-10-CM | POA: Diagnosis not present

## 2015-03-23 DIAGNOSIS — W19XXXA Unspecified fall, initial encounter: Secondary | ICD-10-CM

## 2015-03-23 DIAGNOSIS — M24351 Pathological dislocation of right hip, not elsewhere classified: Secondary | ICD-10-CM | POA: Diagnosis not present

## 2015-03-23 DIAGNOSIS — S79921A Unspecified injury of right thigh, initial encounter: Secondary | ICD-10-CM | POA: Diagnosis not present

## 2015-03-23 MED ORDER — FENTANYL CITRATE (PF) 100 MCG/2ML IJ SOLN: 100.0000 ug | Freq: Once | INTRAMUSCULAR | Status: DC

## 2015-03-23 MED ORDER — KETAMINE HCL 10 MG/ML IJ SOLN
1.0000 mg/kg | Freq: Once | INTRAMUSCULAR | Status: AC
  Filled 2015-03-23: qty 10.6

## 2015-03-23 MED ORDER — DIAZEPAM 5 MG/ML IJ SOLN
5.0000 mg | Freq: Once | INTRAMUSCULAR | Status: AC
  Administered 2015-03-23: 5 mg via INTRAVENOUS
  Filled 2015-03-23: qty 2

## 2015-03-23 MED ORDER — SODIUM CHLORIDE 0.9 % IV SOLN
INTRAVENOUS | Status: DC
  Administered 2015-03-23: 08:00:00 via INTRAVENOUS

## 2015-03-23 MED ORDER — PROPOFOL 10 MG/ML IV BOLUS
0.5000 mg/kg | Freq: Once | INTRAVENOUS | Status: DC
  Filled 2015-03-23: qty 20

## 2015-03-23 MED ORDER — HYDROMORPHONE HCL 1 MG/ML IJ SOLN
1.0000 mg | Freq: Once | INTRAMUSCULAR | Status: AC
  Administered 2015-03-23: 1 mg via INTRAVENOUS
  Filled 2015-03-23: qty 1

## 2015-03-23 MED ORDER — FENTANYL CITRATE (PF) 100 MCG/2ML IJ SOLN
50.0000 ug | Freq: Once | INTRAMUSCULAR | Status: AC
  Administered 2015-03-23: 50 ug via INTRAVENOUS
  Filled 2015-03-23: qty 2

## 2015-03-23 MED ORDER — KETAMINE HCL 10 MG/ML IJ SOLN
INTRAMUSCULAR | Status: DC | PRN
  Administered 2015-03-23: 20 mg via INTRAVENOUS
  Administered 2015-03-23: 60 mg via INTRAVENOUS

## 2015-03-23 MED ORDER — PROPOFOL 10 MG/ML IV BOLUS
INTRAVENOUS | Status: DC | PRN
  Administered 2015-03-23: 30 mg via INTRAVENOUS

## 2015-03-23 NOTE — ED Provider Notes (Signed)
CSN: [id number]     Arrival date & time 03/23/15  0350 History   First MD Initiated Contact with Patient 03/23/15 [phone]     Chief Complaint  Patient presents with  . Fall  . Leg Pain     (Consider location/radiation/quality/duration/timing/severity/associated sxs/prior Treatment) HPI  Patient states he had a metal hip replacement done and he has been having frequent dislocations. He states tonight he got up in the middle night to get ice cream and when he was walking back to his bedroom he forgot he had been leaving towels and shoes on the floor by his bedroom door for the past several days and he tripped and fell landing on his right hip. He states he feels like his hip is dislocated. He denies any other injury such as hitting his head or loss of consciousness. He states Dr. Turner Daniels did his original hip surgery. He states his hip replacement was complicated later by falling and having a femur fracture. He states he started having right knee pain and he finally went to Dr. August Saucer who did arthroscopic surgery and found there were some shavings in his knee joint that were removed. He states they cannot decide what to do about his hip replacement because of the long rod that he has in his femur.  PCP Dr Esmond Harps Orthopedist Dr Turner Daniels and Dr August Saucer  Past Medical History  Diagnosis Date  . Hypertension   . GERD (gastroesophageal reflux disease)   . H/O hiatal hernia    Past Surgical History  Procedure Laterality Date  . Appendectomy      as child  . Joint replacement  2005    Right Hip  . Esophagogastroduodenoscopy (egd) with propofol  02/17/2012    Procedure: ESOPHAGOGASTRODUODENOSCOPY (EGD) WITH PROPOFOL;  Surgeon: Willis Modena, MD;  Location: [facility] ENDOSCOPY;  Service: Endoscopy;  Laterality: N/A;  . Colonoscopy with propofol  02/17/2012    Procedure: COLONOSCOPY WITH PROPOFOL;  Surgeon: Willis Modena, MD;  Location: [facility] ENDOSCOPY;  Service: Endoscopy;  Laterality: N/A;  .  Esophagogastroduodenoscopy Left 09/19/2013    Procedure: ESOPHAGOGASTRODUODENOSCOPY (EGD);  Surgeon: Willis Modena, MD;  Location: [facility] ENDOSCOPY;  Service: Endoscopy;  Laterality: Left;  . Hip closed reduction Right 10/20/2013    Procedure: CLOSED REDUCTION HIP;  Surgeon: Eldred Manges, MD;  Location: [facility] OR;  Service: Orthopedics;  Laterality: Right;  . Hip closed reduction Right 02/18/2015    Procedure: CLOSED REDUCTION HIP;  Surgeon: Jodi Geralds, MD;  Location: [facility] ORS;  Service: Orthopedics;  Laterality: Right;  . Hip closed reduction Right 03/16/2015    Procedure: CLOSED REDUCTION HIP;  Surgeon: Kathryne Hitch, MD;  Location: [facility] OR;  Service: Orthopedics;  Laterality: Right;   Family History  Problem Relation Age of Onset  . Diabetes Mother    Social History  Substance Use Topics  . Smoking status: Never Smoker   . Smokeless tobacco: Never Used  . Alcohol Use: 2.4 oz/week    4 Cans of beer per week     Comment: social  lives at home Lives with mother  Review of Systems  All other systems reviewed and are negative.     Allergies  Ambien; Clonidine derivatives; and Nu-iron  Home Medications   Prior to Admission medications   Medication Sig Start Date End Date Taking? Authorizing Provider  ALPRAZolam Prudy Feeler) 1 MG tablet Take 1 mg by mouth at bedtime as needed for anxiety. *may repeat in 3-4 hrs if needed*    Historical Provider,  MD  atorvastatin (LIPITOR) 40 MG tablet Take 40 mg by mouth daily.    Historical Provider, MD  ferrous fumarate (HEMOCYTE - 106 MG FE) 325 (106 FE) MG TABS tablet Take 1 tablet by mouth 3 (three) times daily.     Historical Provider, MD  FLUoxetine (PROZAC) 40 MG capsule Take 40 mg by mouth daily.    Historical Provider, MD  loratadine (CLARITIN) 10 MG tablet Take 10 mg by mouth daily as needed for allergies.     Historical Provider, MD  Melatonin 10 MG TABS Take 10 mg by mouth at bedtime as needed (sleep).     Historical Provider, MD  metoprolol  (LOPRESSOR) 50 MG tablet Take 50 mg by mouth 2 (two) times daily.    Historical Provider, MD  Multiple Vitamin (MULTIVITAMIN) tablet Take 1 tablet by mouth daily.    Historical Provider, MD  oxyCODONE (ROXICODONE) 5 MG immediate release tablet Take 1 tablet (5 mg total) by mouth 2 (two) times daily as needed for severe pain. 02/22/15   Tomasita CrumbleAdeleke Oni, MD  pantoprazole (PROTONIX) 40 MG tablet Take 40 mg by mouth 2 (two) times daily.    Historical Provider, MD  QUEtiapine (SEROQUEL) 200 MG tablet Take 200 mg by mouth at bedtime.    Historical Provider, MD  tiZANidine (ZANAFLEX) 4 MG tablet Take 1 tablet by mouth 3 (three) times daily. 11/25/14   Historical Provider, MD   BP 115/68 mmHg  Pulse 51  Temp(Src) 97.7 F (36.5 C) (Oral)  Resp 20  SpO2 100%  Vital signs normal except for bradycardia  Physical Exam  Constitutional: He is oriented to person, place, and time. He appears well-developed and well-nourished.  Non-toxic appearance. He does not appear ill. No distress.  HENT:  Head: Normocephalic and atraumatic.  Right Ear: External ear normal.  Left Ear: External ear normal.  Nose: Nose normal. No mucosal edema or rhinorrhea.  Mouth/Throat: Mucous membranes are normal. No dental abscesses or uvula swelling.  Eyes: Conjunctivae and EOM are normal.  Neck: Normal range of motion and full passive range of motion without pain.  Cardiovascular: Normal rate.   No murmur heard. Pulmonary/Chest: Effort normal. No respiratory distress. He has no rhonchi.  Abdominal: Normal appearance.  Musculoskeletal: Normal range of motion. He exhibits edema and tenderness.  Moves all extremities well except for his right lower extremity. She is noted to have internal rotation and shortening of his right lower extremity. He has strong distal pulses. He has intact sensation..   Neurological: He is alert and oriented to person, place, and time. He has normal strength. No cranial nerve deficit.  Skin: Skin is warm,  dry and intact. No rash noted. No erythema. There is pallor.  Psychiatric: He has a normal mood and affect. His speech is normal and behavior is normal. His mood appears not anxious.  Nursing note and vitals reviewed.   ED Course  Procedures (including critical care time)  Medications  0.9 %  sodium chloride infusion (not administered)  fentaNYL (SUBLIMAZE) injection 50 mcg (50 mcg Intravenous Given 03/23/15 0453)  HYDROmorphone (DILAUDID) injection 1 mg (1 mg Intravenous Given 03/23/15 0612)  diazepam (VALIUM) injection 5 mg (5 mg Intravenous Given 03/23/15 [id number])    Patient was given IV pain medication after reviewing his x-ray. Patient states normally his leg isn't as short as it is now, he feels like this dislocation is the worse when he's had. Review of his chart shows he just was taken to the OR to  relocate his hip on January 7 and December 12.  06:36 Dr Yevette Edwards, [facility], will review his xrays and prior OR reductions, wants the ED to try to reduce the dislocation.   07:00 Dr Juleen China will do his procedural sedation and attempt reduction.    Imaging Review Dg Hip Unilat  With Pelvis 2-3 Views Right  03/23/2015  CLINICAL DATA:  45 year old male with fall and right hip dislocation. History of right hip arthroplasty. EXAM: DG HIP (WITH OR WITHOUT PELVIS) 2-3V RIGHT; RIGHT FEMUR 2 VIEWS COMPARISON:  Radiograph dated 03/16/2015 FINDINGS: There is a right hip arthroplasty. There is recurrent superior and lateral dislocation of the femoral component in relation to the acetabular cup similar to the study dated 03/16/2015. 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 There are chronic changes and an old healed fracture deformity of the mid and distal right femur. No acute fracture identified. 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 No acute fracture. Electronically Signed   By: Elgie Collard M.D.   On: 03/23/2015 05:43   Dg Femur,  Min 2 Views Right  03/23/2015  CLINICAL DATA:  45 year old male with fall and right hip dislocation. History of right hip arthroplasty. EXAM: DG HIP (WITH OR WITHOUT PELVIS) 2-3V RIGHT; RIGHT FEMUR 2 VIEWS COMPARISON:  Radiograph dated 03/16/2015 FINDINGS: There is a right hip arthroplasty. There is recurrent superior and lateral dislocation of the femoral component in relation to the acetabular cup similar to the study dated 03/16/2015. 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 There are chronic changes and an old healed fracture deformity of the mid and distal right femur. No acute fracture identified. 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 No acute fracture. Electronically Signed   By: Elgie Collard M.D.   On: 03/23/2015 05:43   I have personally reviewed and evaluated these images and lab results as part of my medical decision-making.    MDM   Final diagnoses:  Dislocation of right hip, initial encounter (HCC)   Disposition pending  Devoria Albe, MD, Concha Pyo, MD 03/23/15 [phone]

## 2015-03-23 NOTE — ED Provider Notes (Signed)
Assumed care at change of shift. 44yM with dislocated R hip prosthesis. Hx of same, most recently as a week ago. Has been reduced both in ED and in OR. Discussion with orthopedics by previous provider. Will sedate and try to reduce in ED. Will discuss with ortho again if unsuccessful.   Reduced w/o apparent complication. Ortho FU.     Reduction of dislocation Date/Time: 08:00, 03/23/2015 Performed by: Todd [name], Todd [name] Authorized by: Todd [name], Todd [name] Consent: Verbal consent obtained. Risks and benefits: risks, benefits and alternatives were discussed Consent given by: patient Required items: required blood products, implants, devices, and special equipment available Time out: Immediately prior to procedure a "time out" was called to verify the correct patient, procedure, equipment, support staff and site/side marked as required.  Patient sedated: see below  Vitals: Vital signs were monitored during sedation. Patient tolerance: Patient tolerated the procedure well with no immediate complications. Joint: R hip Reduction technique: Flexion, adduction, external rotation, traction    Procedural Sedation: Date/Time: 08:00, 03/23/2015 Performed by: Todd [name], Todd [name] Authorized by: Todd [name], Todd [name]  Preprocedure  Pre-anesthesia/induction confirmation of laterality/correct procedure site including "time-out."  Provider confirms review of the nurses' note, allergies, medications, pertinent labs, PMH, pre-induction vital signs, pulse oximetry, pain level, and ECG (as applicable), and patient condition satisfactory for commencing with order for sedation and procedure.  Medications: ketamine, propofol IV  Patient tolerated procedure and procedural sedation component as expected without apparent immediate complications.  Physician confirms procedural medication orders as administered, patient was assessed by physician post-procedure, and confirms post-sedation plan of care and disposition.  Total time of  sedation/monitoring: 30 minutes  Todd RazorStephen Kaoir Loree, MD 03/23/15 1626

## 2015-03-23 NOTE — ED Notes (Signed)
Pt friend will pick him up and drive him home

## 2015-03-23 NOTE — Discharge Instructions (Signed)
Hip Dislocation °A hip dislocation happens when your thigh bone (the ball) separates from your hip bone (the socket). Hip dislocation is an emergency. If you think you have a hip dislocation and cannot move your leg, get help right away. Do not try to move.  °Your doctor will put your thigh bone back in the joint (the ball back in the socket). Sometimes this can be done without surgery. Surgery may be needed if blood vessels or nerves are damaged, or if the ball cannot be put back into the socket by hand. °HOME CARE °· Rest your injured joint. Do not move it. °· Avoid the activity that caused your injury. °· Put ice on your injured joint for 1 to 2 days or as told by your doctor. °¨ Put ice in a plastic bag. °¨ Place a towel between your skin and the bag. °¨ Leave the ice on for 15 to 20 minutes, every 2 hours while you are awake. °· Use crutches or a walker as told by your doctor. °· Exercise your hip and leg as told by your doctor. °· Only take medicines as told by your doctor. °GET HELP RIGHT AWAY IF: °· Your pain gets worse, not better. °· You feel like your hip has dislocated again. °MAKE SURE YOU: °· Understand these instructions. °· Will watch your condition. °· Will get help right away if you are not doing well or get worse. °  °This information is not intended to replace advice given to you by your health care provider. Make sure you discuss any questions you have with your health care provider. °  °Document Released: 10/22/2010 Document Revised: 03/16/2014 Document Reviewed: 09/25/2014 °Elsevier Interactive Patient Education ©2016 [employer] Inc[employer] ° °

## 2015-03-23 NOTE — ED Notes (Signed)
Dr. Knapp at bedside at this time. 

## 2015-03-23 NOTE — Sedation Documentation (Signed)
MD at bedside to perform sedation and reduction. Pt has signed consent. Ambu bag at bedside. Pt on cardiac monitor and has Co2 detector.

## 2015-03-23 NOTE — Progress Notes (Signed)
Orthopedic Tech Progress Note Patient Details:  Todd HightJohn D [name] 04/06/1970 [id number]  Ortho Devices Type of Ortho Device: Knee Immobilizer Ortho Device/Splint Interventions: Application   [name] 03/23/2015, 9:07 AM

## 2015-03-23 NOTE — Sedation Documentation (Signed)
Pt sedated. Successful reduction

## 2015-03-23 NOTE — ED Notes (Signed)
Patient here via EMS from home with complaint of right upper leg and hip pain. History of significant damage and repair to right upper leg secondary to hip replacement. History of numerous dislocations. Right leg is shortened and rotated laterally. Fall was mechanical, patient reports tripping and falling on pair of shoes.

## 2015-03-23 NOTE — ED Notes (Signed)
Ortho to place knee immobilizer.  

## 2015-03-23 NOTE — ED Notes (Signed)
Patient transported to X-ray 

## 2015-03-26 DIAGNOSIS — F3342 Major depressive disorder, recurrent, in full remission: Secondary | ICD-10-CM | POA: Diagnosis not present

## 2015-03-26 DIAGNOSIS — G4700 Insomnia, unspecified: Secondary | ICD-10-CM | POA: Diagnosis not present

## 2015-03-26 DIAGNOSIS — E785 Hyperlipidemia, unspecified: Secondary | ICD-10-CM | POA: Diagnosis not present

## 2015-03-26 DIAGNOSIS — G894 Chronic pain syndrome: Secondary | ICD-10-CM | POA: Diagnosis not present

## 2015-03-26 DIAGNOSIS — D509 Iron deficiency anemia, unspecified: Secondary | ICD-10-CM | POA: Diagnosis not present

## 2015-03-26 DIAGNOSIS — Z96641 Presence of right artificial hip joint: Secondary | ICD-10-CM | POA: Diagnosis not present

## 2015-03-26 DIAGNOSIS — R5383 Other fatigue: Secondary | ICD-10-CM | POA: Diagnosis not present

## 2015-03-26 DIAGNOSIS — I10 Essential (primary) hypertension: Secondary | ICD-10-CM | POA: Diagnosis not present

## 2015-03-26 DIAGNOSIS — F419 Anxiety disorder, unspecified: Secondary | ICD-10-CM | POA: Diagnosis not present

## 2015-04-01 DIAGNOSIS — E291 Testicular hypofunction: Secondary | ICD-10-CM | POA: Diagnosis not present

## 2015-05-27 DIAGNOSIS — G8929 Other chronic pain: Secondary | ICD-10-CM | POA: Diagnosis not present

## 2015-05-27 DIAGNOSIS — M87852 Other osteonecrosis, left femur: Secondary | ICD-10-CM | POA: Diagnosis not present

## 2015-05-27 DIAGNOSIS — M25551 Pain in right hip: Secondary | ICD-10-CM | POA: Diagnosis not present

## 2015-05-27 DIAGNOSIS — Z79899 Other long term (current) drug therapy: Secondary | ICD-10-CM | POA: Diagnosis not present

## 2015-05-27 DIAGNOSIS — M87051 Idiopathic aseptic necrosis of right femur: Secondary | ICD-10-CM | POA: Diagnosis not present

## 2015-05-27 DIAGNOSIS — Z8781 Personal history of (healed) traumatic fracture: Secondary | ICD-10-CM | POA: Diagnosis not present

## 2015-05-27 DIAGNOSIS — I10 Essential (primary) hypertension: Secondary | ICD-10-CM | POA: Diagnosis not present

## 2015-05-27 DIAGNOSIS — Z96641 Presence of right artificial hip joint: Secondary | ICD-10-CM | POA: Diagnosis not present

## 2015-05-27 DIAGNOSIS — S73004A Unspecified dislocation of right hip, initial encounter: Secondary | ICD-10-CM | POA: Diagnosis not present

## 2015-06-24 DIAGNOSIS — M87052 Idiopathic aseptic necrosis of left femur: Secondary | ICD-10-CM | POA: Diagnosis not present

## 2015-06-24 DIAGNOSIS — M7061 Trochanteric bursitis, right hip: Secondary | ICD-10-CM | POA: Diagnosis not present

## 2015-08-20 DIAGNOSIS — E785 Hyperlipidemia, unspecified: Secondary | ICD-10-CM | POA: Diagnosis not present

## 2015-08-20 DIAGNOSIS — D509 Iron deficiency anemia, unspecified: Secondary | ICD-10-CM | POA: Diagnosis not present

## 2015-08-20 DIAGNOSIS — F39 Unspecified mood [affective] disorder: Secondary | ICD-10-CM | POA: Diagnosis not present

## 2015-08-20 DIAGNOSIS — G894 Chronic pain syndrome: Secondary | ICD-10-CM | POA: Diagnosis not present

## 2015-08-20 DIAGNOSIS — Z79899 Other long term (current) drug therapy: Secondary | ICD-10-CM | POA: Diagnosis not present

## 2015-08-20 DIAGNOSIS — I10 Essential (primary) hypertension: Secondary | ICD-10-CM | POA: Diagnosis not present

## 2015-08-20 DIAGNOSIS — Z96641 Presence of right artificial hip joint: Secondary | ICD-10-CM | POA: Diagnosis not present

## 2015-08-20 DIAGNOSIS — E291 Testicular hypofunction: Secondary | ICD-10-CM | POA: Diagnosis not present

## 2015-09-02 DIAGNOSIS — G8929 Other chronic pain: Secondary | ICD-10-CM | POA: Diagnosis not present

## 2015-09-02 DIAGNOSIS — M25551 Pain in right hip: Secondary | ICD-10-CM | POA: Diagnosis not present

## 2015-09-02 DIAGNOSIS — S73004A Unspecified dislocation of right hip, initial encounter: Secondary | ICD-10-CM | POA: Diagnosis not present

## 2015-10-06 ENCOUNTER — Encounter (HOSPITAL_COMMUNITY): Payer: Self-pay

## 2015-10-06 ENCOUNTER — Emergency Department (HOSPITAL_COMMUNITY): Payer: Medicare Other

## 2015-10-06 ENCOUNTER — Inpatient Hospital Stay (HOSPITAL_COMMUNITY)
Admission: EM | Admit: 2015-10-06 | Discharge: 2015-10-07 | DRG: 918 | Discharge status: Against Medical Advice | Payer: Medicare Other | Attending: Internal Medicine | Admitting: Internal Medicine

## 2015-10-06 DIAGNOSIS — T401X1A Poisoning by heroin, accidental (unintentional), initial encounter: Principal | ICD-10-CM | POA: Diagnosis present

## 2015-10-06 DIAGNOSIS — T401X4A Poisoning by heroin, undetermined, initial encounter: Secondary | ICD-10-CM | POA: Diagnosis not present

## 2015-10-06 DIAGNOSIS — S0990XA Unspecified injury of head, initial encounter: Secondary | ICD-10-CM | POA: Diagnosis not present

## 2015-10-06 DIAGNOSIS — S199XXA Unspecified injury of neck, initial encounter: Secondary | ICD-10-CM | POA: Diagnosis not present

## 2015-10-06 DIAGNOSIS — R0902 Hypoxemia: Secondary | ICD-10-CM | POA: Diagnosis present

## 2015-10-06 DIAGNOSIS — R4182 Altered mental status, unspecified: Secondary | ICD-10-CM | POA: Diagnosis not present

## 2015-10-06 DIAGNOSIS — K219 Gastro-esophageal reflux disease without esophagitis: Secondary | ICD-10-CM | POA: Diagnosis present

## 2015-10-06 DIAGNOSIS — Z79899 Other long term (current) drug therapy: Secondary | ICD-10-CM

## 2015-10-06 DIAGNOSIS — Y92008 Other place in unspecified non-institutional (private) residence as the place of occurrence of the external cause: Secondary | ICD-10-CM

## 2015-10-06 DIAGNOSIS — I10 Essential (primary) hypertension: Secondary | ICD-10-CM | POA: Diagnosis present

## 2015-10-06 DIAGNOSIS — F1190 Opioid use, unspecified, uncomplicated: Secondary | ICD-10-CM | POA: Diagnosis present

## 2015-10-06 HISTORY — DX: Other psychoactive substance dependence, uncomplicated: F19.20

## 2015-10-06 LAB — COMPREHENSIVE METABOLIC PANEL
ALT: 22 U/L (ref 17–63)
AST: 40 U/L (ref 15–41)
Albumin: 3.5 g/dL (ref 3.5–5.0)
Alkaline Phosphatase: 88 U/L (ref 38–126)
Anion gap: 7 (ref 5–15)
BUN: 31 mg/dL — ABNORMAL HIGH (ref 6–20)
CO2: 25 mmol/L (ref 22–32)
Calcium: 8.8 mg/dL — ABNORMAL LOW (ref 8.9–10.3)
Chloride: 110 mmol/L (ref 101–111)
Creatinine, Ser: 1.56 mg/dL — ABNORMAL HIGH (ref 0.61–1.24)
GFR calc Af Amer: >60 mL/min (ref >60)
GFR calc non Af Amer: 52 mL/min — ABNORMAL LOW (ref >60)
Glucose, Bld: 93 mg/dL (ref 65–99)
Potassium: 5.3 mmol/L — ABNORMAL HIGH (ref 3.5–5.1)
Sodium: 142 mmol/L (ref 135–145)
Total Bilirubin: 0.6 mg/dL (ref 0.3–1.2)
Total Protein: 6.1 g/dL — ABNORMAL LOW (ref 6.5–8.1)

## 2015-10-06 LAB — ETHANOL: Alcohol, Ethyl (B): <5 mg/dL (ref <5)

## 2015-10-06 MED ORDER — SODIUM CHLORIDE 0.9 % IV BOLUS (SEPSIS)
1000.0000 mL | Freq: Once | INTRAVENOUS | Status: AC
  Administered 2015-10-06: 1000 mL via INTRAVENOUS

## 2015-10-06 NOTE — ED Triage Notes (Signed)
Pt found in prone position on front porch by wife, LOC, EMS arrival resp 6, fixed pupils 84mm, bagged pt, 2mg  Narcan given, 6L O2, rhonchi bilaterally.  Pt admits to using Heroin usually abuses oxy but was out.

## 2015-10-07 ENCOUNTER — Emergency Department (HOSPITAL_COMMUNITY): Payer: Medicare Other

## 2015-10-07 DIAGNOSIS — I10 Essential (primary) hypertension: Secondary | ICD-10-CM | POA: Diagnosis present

## 2015-10-07 DIAGNOSIS — S199XXA Unspecified injury of neck, initial encounter: Secondary | ICD-10-CM | POA: Diagnosis not present

## 2015-10-07 DIAGNOSIS — R4182 Altered mental status, unspecified: Secondary | ICD-10-CM | POA: Diagnosis present

## 2015-10-07 DIAGNOSIS — S0990XA Unspecified injury of head, initial encounter: Secondary | ICD-10-CM | POA: Diagnosis not present

## 2015-10-07 DIAGNOSIS — F1190 Opioid use, unspecified, uncomplicated: Secondary | ICD-10-CM | POA: Diagnosis present

## 2015-10-07 DIAGNOSIS — R0902 Hypoxemia: Secondary | ICD-10-CM | POA: Diagnosis present

## 2015-10-07 DIAGNOSIS — T401X1A Poisoning by heroin, accidental (unintentional), initial encounter: Secondary | ICD-10-CM | POA: Diagnosis present

## 2015-10-07 DIAGNOSIS — Z79899 Other long term (current) drug therapy: Secondary | ICD-10-CM | POA: Diagnosis not present

## 2015-10-07 DIAGNOSIS — K219 Gastro-esophageal reflux disease without esophagitis: Secondary | ICD-10-CM | POA: Diagnosis present

## 2015-10-07 DIAGNOSIS — Y92008 Other place in unspecified non-institutional (private) residence as the place of occurrence of the external cause: Secondary | ICD-10-CM | POA: Diagnosis not present

## 2015-10-07 LAB — CBC WITH DIFFERENTIAL/PLATELET
Band Neutrophils: 7 %
Eosinophils Relative: 1 %
HCT: 38.2 % — ABNORMAL LOW (ref 39.0–52.0)
Hemoglobin: 12.5 g/dL — ABNORMAL LOW (ref 13.0–17.0)
Lymphocytes Relative: 6 %
MCH: 30.2 pg (ref 26.0–34.0)
MCHC: 32.7 g/dL (ref 30.0–36.0)
MCV: 92.3 fL (ref 78.0–100.0)
Monocytes Relative: 6 %
Neutrophils Relative %: 80 %
Platelets: 313 10*3/uL (ref 150–400)
RBC: 4.14 MIL/uL — ABNORMAL LOW (ref 4.22–5.81)
RDW: 14 % (ref 11.5–15.5)
WBC: 20 10*3/uL — ABNORMAL HIGH (ref 4.0–10.5)

## 2015-10-07 LAB — RAPID URINE DRUG SCREEN, HOSP PERFORMED
Amphetamines: NOT DETECTED
Barbiturates: NOT DETECTED
Benzodiazepines: POSITIVE — AB
Cocaine: NOT DETECTED
Opiates: POSITIVE — AB
Tetrahydrocannabinol: NOT DETECTED

## 2015-10-07 MED ORDER — DEXTROSE 5 % IV SOLN: 500.0000 mg | Freq: Once | INTRAVENOUS | Status: DC

## 2015-10-07 MED ORDER — SODIUM CHLORIDE 0.9 % IV BOLUS (SEPSIS)
1000.0000 mL | Freq: Once | INTRAVENOUS | Status: AC
  Administered 2015-10-07: 1000 mL via INTRAVENOUS

## 2015-10-07 MED ORDER — IPRATROPIUM BROMIDE 0.02 % IN SOLN: 0.5000 mg | Freq: Once | RESPIRATORY_TRACT | Status: DC

## 2015-10-07 MED ORDER — ALBUTEROL SULFATE (2.5 MG/3ML) 0.083% IN NEBU: 5.0000 mg | INHALATION_SOLUTION | Freq: Once | RESPIRATORY_TRACT | Status: DC

## 2015-10-07 MED ORDER — CEFTRIAXONE SODIUM 1 G IJ SOLR: 1.0000 g | Freq: Once | INTRAMUSCULAR | Status: DC

## 2015-10-07 NOTE — ED Provider Notes (Signed)
[facility]-EMERGENCY DEPT Provider Note   CSN: [id number] Arrival date & time: 10/06/15  2217  First Provider Contact:  None       History   Chief Complaint Chief Complaint  Patient presents with  . Drug Overdose    HPI Todd [name] is a 45 y.o. male.  Patient with a PMH drug abuse, GERD, HTN presents via EMS called by wife after patient was found unresponsive on their front porch. Per EMS, patient was found with fixed pupils, unresponsive and given 2 mg Narcan with return of consciousness. Patient, per EMS, admits to heroin use tonight. Here the patient wakes to verbal stimuli, with oxygen saturation 88-91% on 6L. No vomiting per EMS.   The history is provided by the EMS personnel.  Drug Overdose  This is a new problem. The current episode started today.    Past Medical History:  Diagnosis Date  . Drug addiction (HCC)   . GERD (gastroesophageal reflux disease)   . H/O hiatal hernia   . Hypertension     Patient Active Problem List   Diagnosis Date Noted  . Mechanical complication of internal orthopedic device (HCC) 03/16/2015  . Hip dislocation, right (HCC) 02/18/2015  . Anemia 07/16/2014  . Altered mental status 10/20/2013  . Dislocation of hip prosthesis (HCC) 10/20/2013  . CAP (community acquired pneumonia) 10/20/2013  . Acute GI bleeding 09/19/2013  . Acute blood loss anemia 09/19/2013  . Essential hypertension 09/19/2013  . Sinus tachycardia (HCC) 09/19/2013  . GASTROESOPHAGEAL REFLUX, NO ESOPHAGITIS 05/06/2006    Past Surgical History:  Procedure Laterality Date  . APPENDECTOMY     as child  . COLONOSCOPY WITH PROPOFOL  02/17/2012   Procedure: COLONOSCOPY WITH PROPOFOL;  Surgeon: Willis Modena, MD;  Location: [facility] ENDOSCOPY;  Service: Endoscopy;  Laterality: N/A;  . ESOPHAGOGASTRODUODENOSCOPY Left 09/19/2013   Procedure: ESOPHAGOGASTRODUODENOSCOPY (EGD);  Surgeon: Willis Modena, MD;  Location: [facility] ENDOSCOPY;  Service: Endoscopy;  Laterality: Left;  .  ESOPHAGOGASTRODUODENOSCOPY (EGD) WITH PROPOFOL  02/17/2012   Procedure: ESOPHAGOGASTRODUODENOSCOPY (EGD) WITH PROPOFOL;  Surgeon: Willis Modena, MD;  Location: [facility] ENDOSCOPY;  Service: Endoscopy;  Laterality: N/A;  . HIP CLOSED REDUCTION Right 10/20/2013   Procedure: CLOSED REDUCTION HIP;  Surgeon: Eldred Manges, MD;  Location: [facility] OR;  Service: Orthopedics;  Laterality: Right;  . HIP CLOSED REDUCTION Right 02/18/2015   Procedure: CLOSED REDUCTION HIP;  Surgeon: Jodi Geralds, MD;  Location: [facility] ORS;  Service: Orthopedics;  Laterality: Right;  . HIP CLOSED REDUCTION Right 03/16/2015   Procedure: CLOSED REDUCTION HIP;  Surgeon: Kathryne Hitch, MD;  Location: [facility] OR;  Service: Orthopedics;  Laterality: Right;  . JOINT REPLACEMENT  2005   Right Hip       Home Medications    Prior to Admission medications   Medication Sig Start Date End Date Taking? Authorizing Provider  ALPRAZolam Prudy Feeler) 1 MG tablet Take 1 mg by mouth at bedtime as needed for anxiety. *may repeat in 3-4 hrs if needed*    Historical Provider, MD  atorvastatin (LIPITOR) 40 MG tablet Take 40 mg by mouth daily.    Historical Provider, MD  ferrous fumarate (HEMOCYTE - 106 MG FE) 325 (106 FE) MG TABS tablet Take 1 tablet by mouth 3 (three) times daily.     Historical Provider, MD  FLUoxetine (PROZAC) 40 MG capsule Take 40 mg by mouth daily.    Historical Provider, MD  loratadine (CLARITIN) 10 MG tablet Take 10 mg by mouth daily as needed for allergies.  Historical Provider, MD  Melatonin 10 MG TABS Take 10 mg by mouth at bedtime as needed (sleep).     Historical Provider, MD  metoprolol (LOPRESSOR) 50 MG tablet Take 50 mg by mouth 2 (two) times daily.    Historical Provider, MD  Multiple Vitamin (MULTIVITAMIN) tablet Take 1 tablet by mouth daily.    Historical Provider, MD  oxyCODONE (ROXICODONE) 5 MG immediate release tablet Take 1 tablet (5 mg total) by mouth 2 (two) times daily as needed for severe pain. 02/22/15   [name], MD  pantoprazole (PROTONIX) 40 MG tablet Take 40 mg by mouth 2 (two) times daily.    Historical Provider, MD  QUEtiapine (SEROQUEL) 200 MG tablet Take 200 mg by mouth at bedtime.    Historical Provider, MD  tiZANidine (ZANAFLEX) 4 MG tablet Take 1 tablet by mouth 3 (three) times daily. 11/25/14   Historical Provider, MD    Family History Family History  Problem Relation Age of Onset  . Diabetes Mother     Social History Social History  Substance Use Topics  . Smoking status: Never Smoker  . Smokeless tobacco: Never Used  . Alcohol use 2.4 oz/week    4 Cans of beer per week     Comment: social     Allergies   Ambien [zolpidem tartrate]; Clonidine derivatives; and Nu-iron [polysaccharide iron complex]   Review of Systems Review of Systems  Unable to perform ROS: Other (altered mental status after overdose)     Physical Exam Updated Vital Signs BP 101/67   Pulse 93   Temp 97.7 F (36.5 C) (Oral)   Resp 12   SpO2 96%   Physical Exam  Constitutional: He appears well-developed and well-nourished.  HENT:  Mouth/Throat: Oropharynx is clear and moist.  Facial abrasions to right forehead and temple. No hematoma, active bleeding or bony deformity.  Eyes: Lids are normal.  Pupils fixed at approximately 3 mm bilaterally  Neck: Normal range of motion. Neck supple.  Cardiovascular: Tachycardia present.   No murmur heard. Pulmonary/Chest: No respiratory distress. He exhibits no tenderness.  Audible rhonchi on expiration  Abdominal: Soft. He exhibits no distension. There is no tenderness.  Neurological: GCS eye subscore is 3. GCS verbal subscore is 4. GCS motor subscore is 6.  Patient arousable to verbal command. Somnolent, goes back to sleep quickly. Will follow command while awake.   Skin: Skin is warm and dry.     ED Treatments / Results  Labs (all labs ordered are listed, but only abnormal results are displayed) Labs Reviewed  CBC WITH DIFFERENTIAL/PLATELET -  Abnormal; Notable for the following:       Result Value   WBC 20.0 (*)    RBC 4.14 (*)    Hemoglobin 12.5 (*)    HCT 38.2 (*)    All other components within normal limits  COMPREHENSIVE METABOLIC PANEL - Abnormal; Notable for the following:    Potassium 5.3 (*)    BUN 31 (*)    Creatinine, Ser 1.56 (*)    Calcium 8.8 (*)    Total Protein 6.1 (*)    GFR calc non Af Amer 52 (*)    All other components within normal limits  ETHANOL  URINE RAPID DRUG SCREEN, HOSP PERFORMED    EKG  EKG Interpretation  Date/Time:  Sunday October 06 2015 22:28:51 EDT Ventricular Rate:  111 PR Interval:    QRS Duration: 81 QT Interval:  326 QTC Calculation: 443 R Axis:   81  Text Interpretation:  Sinus tachycardia No STEMI.  Confirmed by LONG MD, JOSHUA [phone]) on 10/06/2015 10:34:33 PM       Radiology Dg Chest 2 View  Result Date: 10/06/2015 CLINICAL DATA:  Hypoxia.  Drug overdose today. EXAM: CHEST  2 VIEW COMPARISON:  Radiographs 02/18/2015, chest CT 10/20/2013 FINDINGS: Lung volumes are low. Heart appears prominent size. Retrocardiac paraesophageal/hiatal her again seen. Crowding of bronchovascular structures with bronchovascular prominence. Left basilar opacity is likely compressive atelectasis. No large pleural effusion. No evidence of pneumothorax. Mild chronic appearing wedge deformity in the thoracolumbar junction, grossly unchanged. Remote right rib fractures. IMPRESSION: Hypoventilatory chest with bronchovascular crowding and prominence. Uncertain whether this represents vascular congestion or secondary to technique. Large hiatal hernia again seen. Electronically Signed   By: Rubye Oaks M.D.   On: 10/06/2015 23:28   Procedures Procedures (including critical care time)  Medications Ordered in ED Medications  sodium chloride 0.9 % bolus 1,000 mL (0 mLs Intravenous Paused 10/06/15 2350)     Initial Impression / Assessment and Plan / ED Course  I have reviewed the triage vital signs  and the nursing notes.  Pertinent labs & imaging results that were available during my care of the patient were reviewed by me and considered in my medical decision making (see chart for details).  Clinical Course  23:30 - CXR showing possible vascular congestion. He continues to sleep without complaint. REmains responsive to verbal stimuli.  1:20 - Head and neck CT negative for acute injury. Labs are reassuring. Mildly elevated cr 1.56, BUN 31. O2 sats hovering at 90% on 6L.   3:00 - patient is more awake. Improving over time. Anticipate discharge if he continues to improve.   4:50 - patient is awake, talking, oriented. Oxygen removed and will monitor. He continues to have audible rhoncherous expirations. He is asking for pain medications. Discussed that with overdose that narcotics would be withheld for now.   5:00 - patient desats to 86% off oxygen. Additional fluids provided to improve tachycardia.  5:50 - Patient awake and alert, much improved since arrival. He remains having abnormal breathing/breath sounds, hypoxic off oxygen and tachycardic despite fluids. CXR c/w vascular congestion. No history of CHF, non-smoker. IV abx started. Will admit for further observation and treatment. He again asks for pain medication. Offered Tylenol which patient declined.   6:15 - Patient found by nurse to have removed his IV and monitor cables, and walking out of the department. No response to calling him by name. AMA discharge.   Final Clinical Impressions(s) / ED Diagnoses   Final diagnoses:  None   1. Overdose 2. Hypoxia  New Prescriptions New Prescriptions   No medications on file     Elpidio Anis, PA-C 10/07/15 6045    Maia Plan, MD 10/07/15 1003

## 2015-10-07 NOTE — ED Notes (Signed)
Pt removed his IV and left the unit with his wife. This RN attempted to talk with the patient and his wife, they refused and left the [facility]. Elpidio Anis, PA aware.

## 2015-10-16 DIAGNOSIS — G8929 Other chronic pain: Secondary | ICD-10-CM | POA: Diagnosis not present

## 2015-10-16 DIAGNOSIS — S73004D Unspecified dislocation of right hip, subsequent encounter: Secondary | ICD-10-CM | POA: Diagnosis not present

## 2015-10-16 DIAGNOSIS — Z79899 Other long term (current) drug therapy: Secondary | ICD-10-CM | POA: Diagnosis not present

## 2015-10-16 DIAGNOSIS — T84020D Dislocation of internal right hip prosthesis, subsequent encounter: Secondary | ICD-10-CM | POA: Diagnosis not present

## 2015-10-16 DIAGNOSIS — Z01812 Encounter for preprocedural laboratory examination: Secondary | ICD-10-CM | POA: Diagnosis not present

## 2015-10-16 DIAGNOSIS — Z01818 Encounter for other preprocedural examination: Secondary | ICD-10-CM | POA: Diagnosis not present

## 2015-10-16 DIAGNOSIS — M25551 Pain in right hip: Secondary | ICD-10-CM | POA: Diagnosis not present

## 2015-10-16 DIAGNOSIS — I10 Essential (primary) hypertension: Secondary | ICD-10-CM | POA: Diagnosis not present

## 2015-10-24 DIAGNOSIS — G8929 Other chronic pain: Secondary | ICD-10-CM | POA: Diagnosis not present

## 2015-10-24 DIAGNOSIS — M25551 Pain in right hip: Secondary | ICD-10-CM | POA: Diagnosis not present

## 2015-10-24 DIAGNOSIS — Z96641 Presence of right artificial hip joint: Secondary | ICD-10-CM | POA: Diagnosis not present

## 2015-11-06 DIAGNOSIS — Z5181 Encounter for therapeutic drug level monitoring: Secondary | ICD-10-CM | POA: Diagnosis not present

## 2015-11-06 DIAGNOSIS — Z131 Encounter for screening for diabetes mellitus: Secondary | ICD-10-CM | POA: Diagnosis not present

## 2015-11-06 DIAGNOSIS — F418 Other specified anxiety disorders: Secondary | ICD-10-CM | POA: Diagnosis not present

## 2015-11-06 DIAGNOSIS — E785 Hyperlipidemia, unspecified: Secondary | ICD-10-CM | POA: Diagnosis not present

## 2015-11-06 DIAGNOSIS — G894 Chronic pain syndrome: Secondary | ICD-10-CM | POA: Diagnosis not present

## 2015-11-06 DIAGNOSIS — K047 Periapical abscess without sinus: Secondary | ICD-10-CM | POA: Diagnosis not present

## 2015-11-06 DIAGNOSIS — I10 Essential (primary) hypertension: Secondary | ICD-10-CM | POA: Diagnosis not present

## 2015-11-06 DIAGNOSIS — K219 Gastro-esophageal reflux disease without esophagitis: Secondary | ICD-10-CM | POA: Diagnosis not present

## 2015-11-26 DIAGNOSIS — M545 Low back pain: Secondary | ICD-10-CM | POA: Diagnosis not present

## 2015-11-26 DIAGNOSIS — M25551 Pain in right hip: Secondary | ICD-10-CM | POA: Diagnosis not present

## 2015-11-26 DIAGNOSIS — G8929 Other chronic pain: Secondary | ICD-10-CM | POA: Diagnosis not present

## 2015-12-03 DIAGNOSIS — F418 Other specified anxiety disorders: Secondary | ICD-10-CM | POA: Diagnosis not present

## 2015-12-03 DIAGNOSIS — G894 Chronic pain syndrome: Secondary | ICD-10-CM | POA: Diagnosis not present

## 2015-12-03 DIAGNOSIS — E785 Hyperlipidemia, unspecified: Secondary | ICD-10-CM | POA: Diagnosis not present

## 2015-12-03 DIAGNOSIS — E291 Testicular hypofunction: Secondary | ICD-10-CM | POA: Diagnosis not present

## 2015-12-03 DIAGNOSIS — K219 Gastro-esophageal reflux disease without esophagitis: Secondary | ICD-10-CM | POA: Diagnosis not present

## 2015-12-03 DIAGNOSIS — I10 Essential (primary) hypertension: Secondary | ICD-10-CM | POA: Diagnosis not present

## 2015-12-03 DIAGNOSIS — D509 Iron deficiency anemia, unspecified: Secondary | ICD-10-CM | POA: Diagnosis not present

## 2016-01-01 ENCOUNTER — Encounter (HOSPITAL_COMMUNITY): Payer: Self-pay | Admitting: Emergency Medicine

## 2016-01-01 ENCOUNTER — Emergency Department (HOSPITAL_COMMUNITY)
Admission: EM | Admit: 2016-01-01 | Discharge: 2016-01-01 | Discharge status: Against Medical Advice | Payer: Medicare Other | Attending: Emergency Medicine | Admitting: Emergency Medicine

## 2016-01-01 DIAGNOSIS — T40601A Poisoning by unspecified narcotics, accidental (unintentional), initial encounter: Secondary | ICD-10-CM

## 2016-01-01 DIAGNOSIS — T507X1A Poisoning by analeptics and opioid receptor antagonists, accidental (unintentional), initial encounter: Secondary | ICD-10-CM | POA: Insufficient documentation

## 2016-01-01 DIAGNOSIS — T50904A Poisoning by unspecified drugs, medicaments and biological substances, undetermined, initial encounter: Secondary | ICD-10-CM | POA: Diagnosis not present

## 2016-01-01 DIAGNOSIS — I10 Essential (primary) hypertension: Secondary | ICD-10-CM | POA: Insufficient documentation

## 2016-01-01 DIAGNOSIS — T402X1A Poisoning by other opioids, accidental (unintentional), initial encounter: Secondary | ICD-10-CM | POA: Diagnosis not present

## 2016-01-01 DIAGNOSIS — Z96641 Presence of right artificial hip joint: Secondary | ICD-10-CM | POA: Diagnosis not present

## 2016-01-01 LAB — CBC WITH DIFFERENTIAL/PLATELET
Basophils Absolute: 0 10*3/uL (ref 0.0–0.1)
Basophils Relative: 0 %
Eosinophils Absolute: 0.3 10*3/uL (ref 0.0–0.7)
Eosinophils Relative: 3 %
HCT: 43 % (ref 39.0–52.0)
Hemoglobin: 13.8 g/dL (ref 13.0–17.0)
Lymphocytes Relative: 18 %
Lymphs Abs: 1.8 10*3/uL (ref 0.7–4.0)
MCH: 27.6 pg (ref 26.0–34.0)
MCHC: 32.1 g/dL (ref 30.0–36.0)
MCV: 86 fL (ref 78.0–100.0)
Monocytes Absolute: 1.1 10*3/uL — ABNORMAL HIGH (ref 0.1–1.0)
Monocytes Relative: 12 %
Neutro Abs: 6.6 10*3/uL (ref 1.7–7.7)
Neutrophils Relative %: 67 %
Platelets: 378 10*3/uL (ref 150–400)
RBC: 5 MIL/uL (ref 4.22–5.81)
RDW: 14.9 % (ref 11.5–15.5)
WBC: 9.8 10*3/uL (ref 4.0–10.5)

## 2016-01-01 LAB — BLOOD GAS, ARTERIAL
Acid-Base Excess: 0.2 mmol/L (ref 0.0–2.0)
Bicarbonate: 26.9 mmol/L (ref 20.0–28.0)
Drawn by: 257701
FIO2: 0.21
O2 Saturation: 91 %
Patient temperature: 98.6
pCO2 arterial: 54.8 mmHg — ABNORMAL HIGH (ref 32.0–48.0)
pH, Arterial: 7.312 — ABNORMAL LOW (ref 7.350–7.450)
pO2, Arterial: 67.8 mmHg — ABNORMAL LOW (ref 83.0–108.0)

## 2016-01-01 LAB — COMPREHENSIVE METABOLIC PANEL
ALT: 27 U/L (ref 17–63)
AST: 21 U/L (ref 15–41)
Albumin: 4.4 g/dL (ref 3.5–5.0)
Alkaline Phosphatase: 87 U/L (ref 38–126)
Anion gap: 6 (ref 5–15)
BUN: 8 mg/dL (ref 6–20)
CO2: 28 mmol/L (ref 22–32)
Calcium: 8.7 mg/dL — ABNORMAL LOW (ref 8.9–10.3)
Chloride: 102 mmol/L (ref 101–111)
Creatinine, Ser: 1.42 mg/dL — ABNORMAL HIGH (ref 0.61–1.24)
GFR calc Af Amer: >60 mL/min (ref >60)
GFR calc non Af Amer: 59 mL/min — ABNORMAL LOW (ref >60)
Glucose, Bld: 86 mg/dL (ref 65–99)
Potassium: 3.9 mmol/L (ref 3.5–5.1)
Sodium: 136 mmol/L (ref 135–145)
Total Bilirubin: 0.5 mg/dL (ref 0.3–1.2)
Total Protein: 7.6 g/dL (ref 6.5–8.1)

## 2016-01-01 LAB — SALICYLATE LEVEL: Salicylate Lvl: <7 mg/dL (ref 2.8–30.0)

## 2016-01-01 LAB — I-STAT CHEM 8, ED
BUN: 7 mg/dL (ref 6–20)
Calcium, Ion: 1.1 mmol/L — ABNORMAL LOW (ref 1.15–1.40)
Chloride: 100 mmol/L — ABNORMAL LOW (ref 101–111)
Creatinine, Ser: 1.4 mg/dL — ABNORMAL HIGH (ref 0.61–1.24)
Glucose, Bld: 85 mg/dL (ref 65–99)
HCT: 44 % (ref 39.0–52.0)
Hemoglobin: 15 g/dL (ref 13.0–17.0)
Potassium: 3.9 mmol/L (ref 3.5–5.1)
Sodium: 138 mmol/L (ref 135–145)
TCO2: 27 mmol/L (ref 0–100)

## 2016-01-01 LAB — ETHANOL: Alcohol, Ethyl (B): <5 mg/dL (ref <5)

## 2016-01-01 LAB — ACETAMINOPHEN LEVEL: Acetaminophen (Tylenol), Serum: <10 ug/mL — ABNORMAL LOW (ref 10–30)

## 2016-01-01 LAB — CBG MONITORING, ED: Glucose-Capillary: 96 mg/dL (ref 65–99)

## 2016-01-01 MED ORDER — ONDANSETRON HCL 4 MG/2ML IJ SOLN
4.0000 mg | Freq: Once | INTRAMUSCULAR | Status: AC
  Administered 2016-01-01: 4 mg via INTRAVENOUS
  Filled 2016-01-01: qty 2

## 2016-01-01 MED ORDER — NALOXONE HCL 0.4 MG/ML IJ SOLN
0.4000 mg | Freq: Once | INTRAMUSCULAR | Status: AC
Start: 2016-01-01 — End: 2016-01-01
  Administered 2016-01-01: 0.4 mg via INTRAVENOUS
  Filled 2016-01-01: qty 1

## 2016-01-01 NOTE — ED Notes (Signed)
Bed: [id number] Expected date:  Expected time:  Means of arrival:  Comments: EMS- Oxy OD

## 2016-01-01 NOTE — ED Triage Notes (Signed)
Per EMS, Pt, from a pain clinic, presents after an accidental overdose.  Pt c/o chronic hip pain.  Pain score 7/10.  Pt's wife reports Pt took 10mg  Oxycodone x 3.  Pt sts "my wife is a Engineer, civil (consulting)nurse and she tries to take care of me.  She left me the pills."  EMS reports Pt is prescribed 15mg  Oxycodone but has ran out.  Pt reported that he drove to pain clinic this afternoon.  Pain clinic staff initially called EMS d/t thinking the Pt was having a stroke.

## 2016-01-01 NOTE — ED Notes (Signed)
Respiratory at bedside to obtain ABG.

## 2016-01-01 NOTE — ED Notes (Signed)
Patient dozing off while doctor was in the room speaking with patient and the Pa. Patient having a hard time keeping up with conversation. Patient is not sure what medication he took.

## 2016-01-01 NOTE — ED Provider Notes (Signed)
[facility]-EMERGENCY DEPT Provider Note   CSN: [id number] Arrival date & time: 01/01/16  1809     History   Chief Complaint Chief Complaint  Patient presents with  . Drug Overdose    HPI Todd [name] is a 45 y.o. male bib EMS from his pain clinic for opiate OD. The clinic staff were concerned the patient was having a possible stroke. EMS arrived and found the patient with lethargy and pinpoint pupils. He was easily arousable with verbal stimulus. The patient has had multiple opiate overdoses. The patient admits to takeing his usual dose of 15mg  oxycodone TID but nothing else. He denies suicidal intent.  HPI  Past Medical History:  Diagnosis Date  . Drug addiction (HCC)   . GERD (gastroesophageal reflux disease)   . H/O hiatal hernia   . Hypertension     Patient Active Problem List   Diagnosis Date Noted  . Hypoxia 10/07/2015  . Mechanical complication of internal orthopedic device (HCC) 03/16/2015  . Hip dislocation, right (HCC) 02/18/2015  . Anemia 07/16/2014  . Altered mental status 10/20/2013  . Dislocation of hip prosthesis (HCC) 10/20/2013  . CAP (community acquired pneumonia) 10/20/2013  . Acute GI bleeding 09/19/2013  . Acute blood loss anemia 09/19/2013  . Essential hypertension 09/19/2013  . Sinus tachycardia 09/19/2013  . GASTROESOPHAGEAL REFLUX, NO ESOPHAGITIS 05/06/2006    Past Surgical History:  Procedure Laterality Date  . APPENDECTOMY     as child  . COLONOSCOPY WITH PROPOFOL  02/17/2012   Procedure: COLONOSCOPY WITH PROPOFOL;  Surgeon: Willis Modena, MD;  Location: [facility] ENDOSCOPY;  Service: Endoscopy;  Laterality: N/A;  . ESOPHAGOGASTRODUODENOSCOPY Left 09/19/2013   Procedure: ESOPHAGOGASTRODUODENOSCOPY (EGD);  Surgeon: Willis Modena, MD;  Location: [facility] ENDOSCOPY;  Service: Endoscopy;  Laterality: Left;  . ESOPHAGOGASTRODUODENOSCOPY (EGD) WITH PROPOFOL  02/17/2012   Procedure: ESOPHAGOGASTRODUODENOSCOPY (EGD) WITH PROPOFOL;  Surgeon: Willis Modena, MD;   Location: [facility] ENDOSCOPY;  Service: Endoscopy;  Laterality: N/A;  . HIP CLOSED REDUCTION Right 10/20/2013   Procedure: CLOSED REDUCTION HIP;  Surgeon: Eldred Manges, MD;  Location: [facility] OR;  Service: Orthopedics;  Laterality: Right;  . HIP CLOSED REDUCTION Right 02/18/2015   Procedure: CLOSED REDUCTION HIP;  Surgeon: Jodi Geralds, MD;  Location: [facility] ORS;  Service: Orthopedics;  Laterality: Right;  . HIP CLOSED REDUCTION Right 03/16/2015   Procedure: CLOSED REDUCTION HIP;  Surgeon: Kathryne Hitch, MD;  Location: [facility] OR;  Service: Orthopedics;  Laterality: Right;  . JOINT REPLACEMENT  2005   Right Hip       Home Medications    Prior to Admission medications   Medication Sig Start Date End Date Taking? Authorizing Provider  ALPRAZolam Prudy Feeler) 1 MG tablet Take 1 mg by mouth at bedtime as needed for anxiety. *may repeat in 3-4 hrs if needed*    Historical Provider, MD  atorvastatin (LIPITOR) 40 MG tablet Take 40 mg by mouth daily.    Historical Provider, MD  ferrous fumarate (HEMOCYTE - 106 MG FE) 325 (106 FE) MG TABS tablet Take 1 tablet by mouth 3 (three) times daily.     Historical Provider, MD  FLUoxetine (PROZAC) 40 MG capsule Take 40 mg by mouth daily.    Historical Provider, MD  loratadine (CLARITIN) 10 MG tablet Take 10 mg by mouth daily as needed for allergies.     Historical Provider, MD  Melatonin 10 MG TABS Take 10 mg by mouth at bedtime as needed (sleep).     Historical Provider, MD  metoprolol (LOPRESSOR)  50 MG tablet Take 50 mg by mouth 2 (two) times daily.    Historical Provider, MD  Multiple Vitamin (MULTIVITAMIN) tablet Take 1 tablet by mouth daily.    Historical Provider, MD  oxyCODONE (ROXICODONE) 5 MG immediate release tablet Take 1 tablet (5 mg total) by mouth 2 (two) times daily as needed for severe pain. 02/22/15   Tomasita CrumbleAdeleke Oni, MD  pantoprazole (PROTONIX) 40 MG tablet Take 40 mg by mouth 2 (two) times daily.    Historical Provider, MD  QUEtiapine (SEROQUEL) 200 MG tablet  Take 200 mg by mouth at bedtime.    Historical Provider, MD  tiZANidine (ZANAFLEX) 4 MG tablet Take 1 tablet by mouth 3 (three) times daily. 11/25/14   Historical Provider, MD    Family History Family History  Problem Relation Age of Onset  . Diabetes Mother     Social History Social History  Substance Use Topics  . Smoking status: Never Smoker  . Smokeless tobacco: Never Used  . Alcohol use No     Comment: social      Allergies   Ambien [zolpidem tartrate]; Clonidine derivatives; and Nu-iron [polysaccharide iron complex]   Review of Systems Review of Systems Ten systems reviewed and are negative for acute change, except as noted in the HPI.    Physical Exam Updated Vital Signs BP 101/72   Pulse 68   Temp 98.3 F (36.8 C) (Oral)   Resp 14   Ht 6\' 1"  (1.854 m)   Wt 99.8 kg   SpO2 93%   BMI 29.03 kg/m   Physical Exam  Constitutional: He is oriented to person, place, and time. He appears well-developed and well-nourished. He appears lethargic.  Patient with sonorous respirations and apnea. Patient desturated to 84%. Patient aroused easily when sitting upright. He appears to be opiate intoxicated  HENT:  Head: Normocephalic and atraumatic.  Eyes:  Miotic pupils  Neck: No JVD present.  Cardiovascular: Normal rate and regular rhythm.   Pulmonary/Chest:  Normal breath sounds Episodes of sonorous respiration and apnea easily interrupted by speech or light touch  Abdominal: Soft. Bowel sounds are normal.  Musculoskeletal: Normal range of motion.  Neurological: He is oriented to person, place, and time. He appears lethargic. GCS eye subscore is 3. GCS verbal subscore is 5. GCS motor subscore is 6.  Nursing note and vitals reviewed.    ED Treatments / Results  Labs (all labs ordered are listed, but only abnormal results are displayed) Labs Reviewed  ACETAMINOPHEN LEVEL - Abnormal; Notable for the following:       Result Value   Acetaminophen (Tylenol), Serum <10  (*)    All other components within normal limits  COMPREHENSIVE METABOLIC PANEL - Abnormal; Notable for the following:    Creatinine, Ser 1.42 (*)    Calcium 8.7 (*)    GFR calc non Af Amer 59 (*)    All other components within normal limits  CBC WITH DIFFERENTIAL/PLATELET - Abnormal; Notable for the following:    Monocytes Absolute 1.1 (*)    All other components within normal limits  BLOOD GAS, ARTERIAL - Abnormal; Notable for the following:    pH, Arterial 7.312 (*)    pCO2 arterial 54.8 (*)    pO2, Arterial 67.8 (*)    All other components within normal limits  I-STAT CHEM 8, ED - Abnormal; Notable for the following:    Chloride 100 (*)    Creatinine, Ser 1.40 (*)    Calcium, Ion 1.10 (*)  All other components within normal limits  ETHANOL  SALICYLATE LEVEL  CBG MONITORING, ED    EKG  EKG Interpretation None       Radiology No results found.  Procedures Procedures (including critical care time)  Medications Ordered in ED Medications  naloxone [facility]) injection 0.4 mg (0.4 mg Intravenous Given 01/01/16 1858)  ondansetron (ZOFRAN) injection 4 mg (4 mg Intravenous Given 01/01/16 1856)     Initial Impression / Assessment and Plan / ED Course  I have reviewed the triage vital signs and the nursing notes.  Pertinent labs & imaging results that were available during my care of the patient were reviewed by me and considered in my medical decision making (see chart for details).  Clinical Course    Patient with appearance of opiate intoxication He vehemently denies overuse or use of other substances. The patient was givnen a small dose of narcan and zofran and became very alert.  Patient extremely agitated stating to a friend on the phone that he "has no idea why he would be here" and that "everyone is lying on him." The patient removed all leads and IVs and eloped from the ED. Patient at that time was no longer lethargic or having apneic episodes. Given his  history, I doubt other cause of his appaerance and miotic pupils such as organophosphate poisoning, or pontine hemorrhage.   Final Clinical Impressions(s) / ED Diagnoses   Final diagnoses:  Opiate overdose, accidental or unintentional, initial encounter    New Prescriptions Discharge Medication List as of 01/01/2016  7:38 PM       Arthor Captain, PA-C 01/02/16 1021    Raeford Razor, MD 01/04/16 1756

## 2016-01-01 NOTE — ED Notes (Addendum)
Poison Control notified. Recommended Narcan if airway is being compromised Check acetaminophen, fluids, and cardiac monitoring. Observed for 4 hours after Narcan;observation for 6 hours at least

## 2016-01-01 NOTE — ED Notes (Addendum)
Patient called ride to leave after narcan administration. PA and MD aware that patient wants to leave. PA and MD states patient is not IVC'd and can leave. Patient removed his own IV and walked out of the Emergency Room without any difficulty. Patient breathing normally.

## 2016-01-01 NOTE — ED Notes (Signed)
After pt received narcan he called his friend to come and get him. Pt then took out his IV and walked out, the doctor is aware.

## 2016-01-01 NOTE — ED Notes (Signed)
2 IV attempts unsuccessful. 

## 2016-01-03 ENCOUNTER — Emergency Department (HOSPITAL_COMMUNITY)
Admission: EM | Admit: 2016-01-03 | Discharge: 2016-01-03 | Disposition: A | Discharge status: Home / Self Care | Payer: Medicare Other | Attending: Emergency Medicine | Admitting: Emergency Medicine

## 2016-01-03 DIAGNOSIS — T40601A Poisoning by unspecified narcotics, accidental (unintentional), initial encounter: Secondary | ICD-10-CM | POA: Diagnosis not present

## 2016-01-03 DIAGNOSIS — Z79899 Other long term (current) drug therapy: Secondary | ICD-10-CM | POA: Insufficient documentation

## 2016-01-03 DIAGNOSIS — I10 Essential (primary) hypertension: Secondary | ICD-10-CM | POA: Diagnosis not present

## 2016-01-03 DIAGNOSIS — R402431 Glasgow coma scale score 3-8, in the field [EMT or ambulance]: Secondary | ICD-10-CM | POA: Diagnosis not present

## 2016-01-03 LAB — COMPREHENSIVE METABOLIC PANEL
ALT: 24 U/L (ref 17–63)
AST: 21 U/L (ref 15–41)
Albumin: 3.9 g/dL (ref 3.5–5.0)
Alkaline Phosphatase: 92 U/L (ref 38–126)
Anion gap: 6 (ref 5–15)
BUN: 11 mg/dL (ref 6–20)
CO2: 28 mmol/L (ref 22–32)
Calcium: 8.6 mg/dL — ABNORMAL LOW (ref 8.9–10.3)
Chloride: 105 mmol/L (ref 101–111)
Creatinine, Ser: 0.85 mg/dL (ref 0.61–1.24)
GFR calc Af Amer: >60 mL/min (ref >60)
GFR calc non Af Amer: >60 mL/min (ref >60)
Glucose, Bld: 134 mg/dL — ABNORMAL HIGH (ref 65–99)
Potassium: 4 mmol/L (ref 3.5–5.1)
Sodium: 139 mmol/L (ref 135–145)
Total Bilirubin: 0.5 mg/dL (ref 0.3–1.2)
Total Protein: 6.9 g/dL (ref 6.5–8.1)

## 2016-01-03 LAB — CBC WITH DIFFERENTIAL/PLATELET
Basophils Absolute: 0 10*3/uL (ref 0.0–0.1)
Basophils Relative: 0 %
Eosinophils Absolute: 0.4 10*3/uL (ref 0.0–0.7)
Eosinophils Relative: 4 %
HCT: 39.6 % (ref 39.0–52.0)
Hemoglobin: 13 g/dL (ref 13.0–17.0)
Lymphocytes Relative: 9 %
Lymphs Abs: 1 10*3/uL (ref 0.7–4.0)
MCH: 27.8 pg (ref 26.0–34.0)
MCHC: 32.8 g/dL (ref 30.0–36.0)
MCV: 84.6 fL (ref 78.0–100.0)
Monocytes Absolute: 1 10*3/uL (ref 0.1–1.0)
Monocytes Relative: 9 %
Neutro Abs: 8.6 10*3/uL — ABNORMAL HIGH (ref 1.7–7.7)
Neutrophils Relative %: 78 %
Platelets: 341 10*3/uL (ref 150–400)
RBC: 4.68 MIL/uL (ref 4.22–5.81)
RDW: 15 % (ref 11.5–15.5)
WBC: 11 10*3/uL — ABNORMAL HIGH (ref 4.0–10.5)

## 2016-01-03 LAB — ACETAMINOPHEN LEVEL: Acetaminophen (Tylenol), Serum: <10 ug/mL — ABNORMAL LOW (ref 10–30)

## 2016-01-03 LAB — ETHANOL: Alcohol, Ethyl (B): <5 mg/dL (ref <5)

## 2016-01-03 MED ORDER — NALOXONE HCL 2 MG/2ML IJ SOSY
1.0000 mg | PREFILLED_SYRINGE | Freq: Once | INTRAMUSCULAR | Status: AC
  Administered 2016-01-03: 1 mg via INTRAVENOUS
  Filled 2016-01-03: qty 2

## 2016-01-03 NOTE — Discharge Instructions (Signed)
You have overdosed on your opiates again. This is your second overdose in 2 days. You are not willing to stay for further treatment and her leaving against our advice. Stop taking your opiates because it could kill you.

## 2016-01-03 NOTE — ED Notes (Signed)
Pt ambulates to [facility] w/o assistance
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
## 2016-01-03 NOTE — ED Notes (Signed)
Pt agreed to stay and be observed for 1 hour per Dr Rubin PayorPickering request

## 2016-01-03 NOTE — ED Triage Notes (Signed)
Pt up for discharge, went to discharge, pt has left, Dr Rubin PayorPickering had discussed observation until 1:30 pm.
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
## 2016-01-20 DIAGNOSIS — M47816 Spondylosis without myelopathy or radiculopathy, lumbar region: Secondary | ICD-10-CM | POA: Diagnosis not present

## 2016-01-20 DIAGNOSIS — R0681 Apnea, not elsewhere classified: Secondary | ICD-10-CM | POA: Diagnosis not present

## 2016-01-28 DIAGNOSIS — Z23 Encounter for immunization: Secondary | ICD-10-CM | POA: Diagnosis not present

## 2016-02-10 DIAGNOSIS — T56891D Toxic effect of other metals, accidental (unintentional), subsequent encounter: Secondary | ICD-10-CM | POA: Diagnosis not present

## 2016-02-10 DIAGNOSIS — Z96641 Presence of right artificial hip joint: Secondary | ICD-10-CM | POA: Diagnosis not present

## 2016-02-10 DIAGNOSIS — Z01812 Encounter for preprocedural laboratory examination: Secondary | ICD-10-CM | POA: Diagnosis not present

## 2016-02-10 DIAGNOSIS — M25551 Pain in right hip: Secondary | ICD-10-CM | POA: Diagnosis not present

## 2016-02-10 DIAGNOSIS — T5691XD Toxic effect of unspecified metal, accidental (unintentional), subsequent encounter: Secondary | ICD-10-CM | POA: Diagnosis not present

## 2016-02-10 DIAGNOSIS — M47898 Other spondylosis, sacral and sacrococcygeal region: Secondary | ICD-10-CM | POA: Diagnosis not present

## 2016-02-10 DIAGNOSIS — M87851 Other osteonecrosis, right femur: Secondary | ICD-10-CM | POA: Diagnosis not present

## 2016-02-10 DIAGNOSIS — G8929 Other chronic pain: Secondary | ICD-10-CM | POA: Diagnosis not present

## 2016-02-13 DIAGNOSIS — G894 Chronic pain syndrome: Secondary | ICD-10-CM | POA: Diagnosis not present

## 2016-02-13 DIAGNOSIS — M25552 Pain in left hip: Secondary | ICD-10-CM | POA: Diagnosis not present

## 2016-02-13 DIAGNOSIS — M25551 Pain in right hip: Secondary | ICD-10-CM | POA: Diagnosis not present

## 2016-02-21 DIAGNOSIS — E785 Hyperlipidemia, unspecified: Secondary | ICD-10-CM | POA: Diagnosis not present

## 2016-02-21 DIAGNOSIS — Z5181 Encounter for therapeutic drug level monitoring: Secondary | ICD-10-CM | POA: Diagnosis not present

## 2016-02-21 DIAGNOSIS — D509 Iron deficiency anemia, unspecified: Secondary | ICD-10-CM | POA: Diagnosis not present

## 2016-02-21 DIAGNOSIS — F418 Other specified anxiety disorders: Secondary | ICD-10-CM | POA: Diagnosis not present

## 2016-02-21 DIAGNOSIS — E291 Testicular hypofunction: Secondary | ICD-10-CM | POA: Diagnosis not present

## 2016-02-21 DIAGNOSIS — I10 Essential (primary) hypertension: Secondary | ICD-10-CM | POA: Diagnosis not present

## 2016-02-21 DIAGNOSIS — G894 Chronic pain syndrome: Secondary | ICD-10-CM | POA: Diagnosis not present

## 2016-02-21 DIAGNOSIS — K219 Gastro-esophageal reflux disease without esophagitis: Secondary | ICD-10-CM | POA: Diagnosis not present

## 2016-02-22 DIAGNOSIS — S93401A Sprain of unspecified ligament of right ankle, initial encounter: Secondary | ICD-10-CM | POA: Diagnosis not present

## 2016-02-25 DIAGNOSIS — M5442 Lumbago with sciatica, left side: Secondary | ICD-10-CM | POA: Diagnosis not present

## 2016-02-25 DIAGNOSIS — G894 Chronic pain syndrome: Secondary | ICD-10-CM | POA: Diagnosis not present

## 2016-02-25 DIAGNOSIS — M545 Low back pain: Secondary | ICD-10-CM | POA: Diagnosis not present

## 2016-02-25 DIAGNOSIS — M47816 Spondylosis without myelopathy or radiculopathy, lumbar region: Secondary | ICD-10-CM | POA: Diagnosis not present

## 2016-02-25 DIAGNOSIS — M79661 Pain in right lower leg: Secondary | ICD-10-CM | POA: Diagnosis not present

## 2016-02-25 DIAGNOSIS — M79651 Pain in right thigh: Secondary | ICD-10-CM | POA: Diagnosis not present

## 2016-02-25 DIAGNOSIS — M25551 Pain in right hip: Secondary | ICD-10-CM | POA: Diagnosis not present

## 2016-02-26 DIAGNOSIS — M5442 Lumbago with sciatica, left side: Secondary | ICD-10-CM | POA: Diagnosis not present

## 2016-02-26 DIAGNOSIS — M79651 Pain in right thigh: Secondary | ICD-10-CM | POA: Diagnosis not present

## 2016-02-26 DIAGNOSIS — M79661 Pain in right lower leg: Secondary | ICD-10-CM | POA: Diagnosis not present

## 2016-02-26 DIAGNOSIS — M545 Low back pain: Secondary | ICD-10-CM | POA: Diagnosis not present

## 2016-02-26 DIAGNOSIS — G894 Chronic pain syndrome: Secondary | ICD-10-CM | POA: Diagnosis not present

## 2016-02-26 DIAGNOSIS — M25551 Pain in right hip: Secondary | ICD-10-CM | POA: Diagnosis not present

## 2016-02-26 DIAGNOSIS — M47816 Spondylosis without myelopathy or radiculopathy, lumbar region: Secondary | ICD-10-CM | POA: Diagnosis not present

## 2016-02-26 DIAGNOSIS — Z79891 Long term (current) use of opiate analgesic: Secondary | ICD-10-CM | POA: Diagnosis not present

## 2016-03-03 DIAGNOSIS — G894 Chronic pain syndrome: Secondary | ICD-10-CM | POA: Diagnosis not present

## 2016-03-03 DIAGNOSIS — Z79891 Long term (current) use of opiate analgesic: Secondary | ICD-10-CM | POA: Diagnosis not present

## 2016-03-03 DIAGNOSIS — M545 Low back pain: Secondary | ICD-10-CM | POA: Diagnosis not present

## 2016-03-03 DIAGNOSIS — M79661 Pain in right lower leg: Secondary | ICD-10-CM | POA: Diagnosis not present

## 2016-03-03 DIAGNOSIS — M79651 Pain in right thigh: Secondary | ICD-10-CM | POA: Diagnosis not present

## 2016-03-03 DIAGNOSIS — M25551 Pain in right hip: Secondary | ICD-10-CM | POA: Diagnosis not present

## 2016-03-10 DIAGNOSIS — G43909 Migraine, unspecified, not intractable, without status migrainosus: Secondary | ICD-10-CM | POA: Diagnosis not present

## 2016-03-10 DIAGNOSIS — T84020A Dislocation of internal right hip prosthesis, initial encounter: Secondary | ICD-10-CM | POA: Diagnosis not present

## 2016-03-10 DIAGNOSIS — Z79891 Long term (current) use of opiate analgesic: Secondary | ICD-10-CM | POA: Diagnosis not present

## 2016-03-10 DIAGNOSIS — F329 Major depressive disorder, single episode, unspecified: Secondary | ICD-10-CM | POA: Diagnosis not present

## 2016-03-10 DIAGNOSIS — G8929 Other chronic pain: Secondary | ICD-10-CM | POA: Diagnosis not present

## 2016-03-10 DIAGNOSIS — K219 Gastro-esophageal reflux disease without esophagitis: Secondary | ICD-10-CM | POA: Diagnosis not present

## 2016-03-10 DIAGNOSIS — F419 Anxiety disorder, unspecified: Secondary | ICD-10-CM | POA: Diagnosis not present

## 2016-03-10 DIAGNOSIS — E785 Hyperlipidemia, unspecified: Secondary | ICD-10-CM | POA: Diagnosis not present

## 2016-03-10 DIAGNOSIS — I10 Essential (primary) hypertension: Secondary | ICD-10-CM | POA: Diagnosis not present

## 2016-03-11 ENCOUNTER — Emergency Department (HOSPITAL_COMMUNITY): Payer: Medicare Other

## 2016-03-11 ENCOUNTER — Emergency Department (HOSPITAL_COMMUNITY)
Admission: EM | Admit: 2016-03-11 | Discharge: 2016-03-11 | Discharge status: Against Medical Advice | Payer: Medicare Other | Attending: Emergency Medicine | Admitting: Emergency Medicine

## 2016-03-11 DIAGNOSIS — T40601A Poisoning by unspecified narcotics, accidental (unintentional), initial encounter: Secondary | ICD-10-CM

## 2016-03-11 DIAGNOSIS — R569 Unspecified convulsions: Secondary | ICD-10-CM | POA: Insufficient documentation

## 2016-03-11 DIAGNOSIS — R4182 Altered mental status, unspecified: Secondary | ICD-10-CM | POA: Insufficient documentation

## 2016-03-11 DIAGNOSIS — T402X1A Poisoning by other opioids, accidental (unintentional), initial encounter: Secondary | ICD-10-CM | POA: Insufficient documentation

## 2016-03-11 DIAGNOSIS — Z96641 Presence of right artificial hip joint: Secondary | ICD-10-CM | POA: Diagnosis not present

## 2016-03-11 DIAGNOSIS — J9811 Atelectasis: Secondary | ICD-10-CM | POA: Diagnosis not present

## 2016-03-11 DIAGNOSIS — Z79899 Other long term (current) drug therapy: Secondary | ICD-10-CM | POA: Insufficient documentation

## 2016-03-11 DIAGNOSIS — I10 Essential (primary) hypertension: Secondary | ICD-10-CM | POA: Diagnosis not present

## 2016-03-11 LAB — CBG MONITORING, ED: Glucose-Capillary: 135 mg/dL — ABNORMAL HIGH (ref 65–99)

## 2016-03-11 MED ORDER — SODIUM CHLORIDE 0.9 % IV SOLN
Freq: Once | INTRAVENOUS | Status: AC
  Administered 2016-03-11: 20:00:00 via INTRAVENOUS

## 2016-03-11 MED ORDER — NALOXONE HCL 2 MG/2ML IJ SOSY
2.0000 mg | PREFILLED_SYRINGE | Freq: Once | INTRAMUSCULAR | Status: AC
  Administered 2016-03-11: 2 mg via INTRAVENOUS

## 2016-03-11 NOTE — ED Notes (Signed)
Pt waiting for wife to pick him up. Signed AMA form. Remains A&Ox4. Attempted to recheck vitals, pt reports vitals are not needed at this time

## 2016-03-11 NOTE — ED Notes (Signed)
POCT CBG resulted 135; Joni FearsLui, MD present

## 2016-03-11 NOTE — ED Provider Notes (Signed)
[facility]-EMERGENCY DEPT Provider Note   CSN: [id number] Arrival date & time: 03/11/16  1951     History   Chief Complaint Chief Complaint  Patient presents with  . Seizures  . Altered Mental Status    HPI Todd [name] is a 46 y.o. male.  The history is provided by the patient and the EMS personnel (history from patient given only after receiving narcan).     This is a 46 year old male with a past medical history of narcotic overdose, hypertension, GERD who presents today with altered mental status. Patient reported to have seizure-like activity at home. EMS found him with jerking-like motions unresponsive. They suspected seizure and give the patient approximately 5 mg of diazepam IV. In route patient continued to have jerking motion was given an additional 2.5 mg of midazolam IV. On arrival the patient is unresponsive. He is being bagged by EMS. History is limited initially. Patient was given Narcan 2 mg IV and patient seem began to breathe on his own and workup.  On further discussion the patient states he only takes what is prescribed to him. He states that he took 2 Norco 7-1/2 mg/325 acetaminophen tablets today. Denies taking anything else. Denies any other drug use or IV drug use. He states this was an accident and he thinks he may have "developed a sensitivity" to his medications.   Past Medical History:  Diagnosis Date  . Drug addiction (HCC)   . GERD (gastroesophageal reflux disease)   . H/O hiatal hernia   . Hypertension     Patient Active Problem List   Diagnosis Date Noted  . Hypoxia 10/07/2015  . Mechanical complication of internal orthopedic device (HCC) 03/16/2015  . Hip dislocation, right (HCC) 02/18/2015  . Anemia 07/16/2014  . Altered mental status 10/20/2013  . Dislocation of hip prosthesis (HCC) 10/20/2013  . CAP (community acquired pneumonia) 10/20/2013  . Acute GI bleeding 09/19/2013  . Acute blood loss anemia 09/19/2013  . Essential hypertension  09/19/2013  . Sinus tachycardia 09/19/2013  . GASTROESOPHAGEAL REFLUX, NO ESOPHAGITIS 05/06/2006    Past Surgical History:  Procedure Laterality Date  . APPENDECTOMY     as child  . COLONOSCOPY WITH PROPOFOL  02/17/2012   Procedure: COLONOSCOPY WITH PROPOFOL;  Surgeon: Willis Modena, MD;  Location: [facility] ENDOSCOPY;  Service: Endoscopy;  Laterality: N/A;  . ESOPHAGOGASTRODUODENOSCOPY Left 09/19/2013   Procedure: ESOPHAGOGASTRODUODENOSCOPY (EGD);  Surgeon: Willis Modena, MD;  Location: [facility] ENDOSCOPY;  Service: Endoscopy;  Laterality: Left;  . ESOPHAGOGASTRODUODENOSCOPY (EGD) WITH PROPOFOL  02/17/2012   Procedure: ESOPHAGOGASTRODUODENOSCOPY (EGD) WITH PROPOFOL;  Surgeon: Willis Modena, MD;  Location: [facility] ENDOSCOPY;  Service: Endoscopy;  Laterality: N/A;  . HIP CLOSED REDUCTION Right 10/20/2013   Procedure: CLOSED REDUCTION HIP;  Surgeon: Eldred Manges, MD;  Location: [facility] OR;  Service: Orthopedics;  Laterality: Right;  . HIP CLOSED REDUCTION Right 02/18/2015   Procedure: CLOSED REDUCTION HIP;  Surgeon: Jodi Geralds, MD;  Location: [facility] ORS;  Service: Orthopedics;  Laterality: Right;  . HIP CLOSED REDUCTION Right 03/16/2015   Procedure: CLOSED REDUCTION HIP;  Surgeon: Kathryne Hitch, MD;  Location: [facility] OR;  Service: Orthopedics;  Laterality: Right;  . JOINT REPLACEMENT  2005   Right Hip       Home Medications    Prior to Admission medications   Medication Sig Start Date End Date Taking? Authorizing Provider  ALPRAZolam Prudy Feeler) 1 MG tablet Take 1 mg by mouth at bedtime as needed for anxiety. *may repeat in 3-4 hrs if  needed*    Historical Provider, MD  atorvastatin (LIPITOR) 40 MG tablet Take 40 mg by mouth daily.    Historical Provider, MD  ferrous fumarate (HEMOCYTE - 106 MG FE) 325 (106 FE) MG TABS tablet Take 1 tablet by mouth 3 (three) times daily.     Historical Provider, MD  FLUoxetine (PROZAC) 40 MG capsule Take 40 mg by mouth daily.    Historical Provider, MD  loratadine (CLARITIN)  10 MG tablet Take 10 mg by mouth daily as needed for allergies.     Historical Provider, MD  Melatonin 10 MG TABS Take 10 mg by mouth at bedtime as needed (sleep).     Historical Provider, MD  metoprolol (LOPRESSOR) 50 MG tablet Take 50 mg by mouth 2 (two) times daily.    Historical Provider, MD  Multiple Vitamin (MULTIVITAMIN) tablet Take 1 tablet by mouth daily.    Historical Provider, MD  pantoprazole (PROTONIX) 40 MG tablet Take 40 mg by mouth 2 (two) times daily.    Historical Provider, MD  QUEtiapine (SEROQUEL) 200 MG tablet Take 200 mg by mouth at bedtime.    Historical Provider, MD  tiZANidine (ZANAFLEX) 4 MG tablet Take 1 tablet by mouth 3 (three) times daily. 11/25/14   Historical Provider, MD    Family History Family History  Problem Relation Age of Onset  . Diabetes Mother     Social History Social History  Substance Use Topics  . Smoking status: Never Smoker  . Smokeless tobacco: Never Used  . Alcohol use No     Comment: social      Allergies   Ambien [zolpidem tartrate]; Clonidine derivatives; Nu-iron [polysaccharide iron complex]; and Fentanyl   Review of Systems Review of Systems  Unable to perform ROS: Acuity of condition  Constitutional: Positive for diaphoresis.  Neurological: Positive for seizures (reported by EMS).  Psychiatric/Behavioral: Positive for agitation and confusion. Negative for suicidal ideas (pt denies any suicidal intentions/thoughts today).     Physical Exam Updated Vital Signs BP 104/65   Pulse 79   Temp 98.8 F (37.1 C)   Resp 16   SpO2 96%   Physical Exam  Constitutional:  BVM attached  HENT:  Head: Normocephalic and atraumatic.  Eyes: Conjunctivae are normal.  Pupils are pinpoint but reactive  Neck: Normal range of motion. Neck supple.  Cardiovascular: Normal rate and regular rhythm.   Pulmonary/Chest: He is in respiratory distress (patient only breathing on his own ~4 times a minute). He has no wheezes. He has no rales.    Abdominal: Soft. He exhibits no distension. There is no tenderness.  Musculoskeletal: Normal range of motion. He exhibits no edema.  Neurological: He is unresponsive. GCS eye subscore is 1. GCS verbal subscore is 1. GCS motor subscore is 1.  Skin: Skin is warm. He is diaphoretic.     ED Treatments / Results  Labs (all labs ordered are listed, but only abnormal results are displayed) Labs Reviewed  CBG MONITORING, ED - Abnormal; Notable for the following:       Result Value   Glucose-Capillary 135 (*)    All other components within normal limits  CBC WITH DIFFERENTIAL/PLATELET  COMPREHENSIVE METABOLIC PANEL  ACETAMINOPHEN LEVEL  SALICYLATE LEVEL    EKG  EKG Interpretation None       Radiology Dg Chest Portable 1 View  Result Date: 03/11/2016 CLINICAL DATA:  Allergic drug reaction. EXAM: PORTABLE CHEST 1 VIEW COMPARISON:  10/06/2015 FINDINGS: Stable mild cardiac enlargement and large hiatal hernia.  Bibasilar atelectasis present, left greater than right. Stable healed right-sided rib fractures. There is no evidence of pulmonary edema, airspace consolidation, pneumothorax, nodule or pleural fluid. IMPRESSION: No active disease. Bibasilar atelectasis, left greater than right. Stable cardiomegaly and large hiatal hernia. Electronically Signed   By: Irish Lack M.D.   On: 03/11/2016 20:28    Procedures Procedures (including critical care time)  Medications Ordered in ED Medications  naloxone [facility]) injection 2 mg (2 mg Intravenous Given 03/11/16 1955)  0.9 %  sodium chloride infusion ( Intravenous Stopped 03/11/16 2047)     Initial Impression / Assessment and Plan / ED Course  I have reviewed the triage vital signs and the nursing notes.  Pertinent labs & imaging results that were available during my care of the patient were reviewed by me and considered in my medical decision making (see chart for details).  Clinical Course     Patient presents today with altered  mental status. On arrival the patient was not responsive and spontaneously breathing at approximately 4 times a minute. Patient notably had pinpoint pupils as well. He was given Narcan 2 mg IV and after a minute or so began waking up and became fully awake quickly.  Patient denies any intentions of self harm associated with today's overdose. States he only took 2 of his Norco tablets. This does not seem congruent with the picture taken here but patient insists that he does not use any IV drugs or take any other medications.  Given the jerking episode I plan to proceed with getting a CT of his head to ensure there is no other reason for him to have the episode that he had today. Patient refuses CT. We discussed need to stay for further observation and to finish his workup including the head CT. Patient was able to repeat back that he understood that by threatening conditions could be missed by leaving before the workup is complete. We will discharge him against medical advice.   Wife arrived approximately 1-2 hours after arrival. She will be taking him home. Patient was able to ambulate without difficulty at the time of discharge.  Final Clinical Impressions(s) / ED Diagnoses   Final diagnoses:  Narcotic overdose, accidental or unintentional, initial encounter  Altered mental status, unspecified altered mental status type    New Prescriptions Discharge Medication List as of 03/11/2016  9:41 PM       Madolyn Frieze, MD 03/11/16 2146    Lavera Guise, MD 03/11/16 [phone]

## 2016-03-11 NOTE — ED Notes (Signed)
Gown placed on patient

## 2016-03-11 NOTE — ED Notes (Signed)
Pt A&Ox4 after narcan. Pt reports taking percocet today. NPA removed.

## 2016-03-11 NOTE — ED Triage Notes (Signed)
Pt from home by [facility] c/o grand mal seizures, witnessed, decorticate posturing  x 3. Given 7.5 mg versed enroute. Pupils unreactive and size 2. NPA placed with assisted ventilations with EMS. BP 85/36, pulse 77. Pt breathing 6-8 bpm on arrival. Per EMS, pt was seen by medics last week for a seizure, pt refused transport at that time. Per wife, pt needs a hip replacement and has been taking percocets for pain, Wife had not given pain medication prior to seizures.

## 2016-03-11 NOTE — ED Notes (Signed)
Patient placed on monitor, continuous pulse oximetry and blood pressure cuff; oxygen being maintained by RT; vitals and EKG performed

## 2016-03-11 NOTE — ED Notes (Signed)
Pt removing saline infusion and in process of removing IV when nurse took out IV

## 2016-03-11 NOTE — ED Notes (Signed)
Pt removed self from cardiac monitor

## 2016-03-11 NOTE — ED Notes (Signed)
Pt refusing CT scan; frustrated and yelling at staff regarding not having a phone to call wife. Pt moved to room with phone. Pt called wife requesting she come pick him up. Dr. Verdie MosherLiu aware

## 2016-03-31 DIAGNOSIS — M25551 Pain in right hip: Secondary | ICD-10-CM | POA: Diagnosis not present

## 2016-04-07 DIAGNOSIS — Z472 Encounter for removal of internal fixation device: Secondary | ICD-10-CM | POA: Diagnosis not present

## 2016-04-07 DIAGNOSIS — G43909 Migraine, unspecified, not intractable, without status migrainosus: Secondary | ICD-10-CM | POA: Diagnosis present

## 2016-04-07 DIAGNOSIS — T84020A Dislocation of internal right hip prosthesis, initial encounter: Secondary | ICD-10-CM | POA: Diagnosis not present

## 2016-04-07 DIAGNOSIS — T5691XD Toxic effect of unspecified metal, accidental (unintentional), subsequent encounter: Secondary | ICD-10-CM | POA: Diagnosis not present

## 2016-04-07 DIAGNOSIS — M87052 Idiopathic aseptic necrosis of left femur: Secondary | ICD-10-CM | POA: Diagnosis not present

## 2016-04-07 DIAGNOSIS — Z96641 Presence of right artificial hip joint: Secondary | ICD-10-CM | POA: Diagnosis not present

## 2016-04-07 DIAGNOSIS — G8918 Other acute postprocedural pain: Secondary | ICD-10-CM | POA: Diagnosis not present

## 2016-04-07 DIAGNOSIS — K219 Gastro-esophageal reflux disease without esophagitis: Secondary | ICD-10-CM | POA: Diagnosis present

## 2016-04-07 DIAGNOSIS — S73004D Unspecified dislocation of right hip, subsequent encounter: Secondary | ICD-10-CM | POA: Diagnosis not present

## 2016-04-07 DIAGNOSIS — Z79891 Long term (current) use of opiate analgesic: Secondary | ICD-10-CM | POA: Diagnosis not present

## 2016-04-07 DIAGNOSIS — M25551 Pain in right hip: Secondary | ICD-10-CM | POA: Diagnosis not present

## 2016-04-07 DIAGNOSIS — F419 Anxiety disorder, unspecified: Secondary | ICD-10-CM | POA: Diagnosis present

## 2016-04-07 DIAGNOSIS — I10 Essential (primary) hypertension: Secondary | ICD-10-CM | POA: Diagnosis present

## 2016-04-07 DIAGNOSIS — F329 Major depressive disorder, single episode, unspecified: Secondary | ICD-10-CM | POA: Diagnosis present

## 2016-04-07 DIAGNOSIS — G8929 Other chronic pain: Secondary | ICD-10-CM | POA: Diagnosis not present

## 2016-04-07 DIAGNOSIS — E785 Hyperlipidemia, unspecified: Secondary | ICD-10-CM | POA: Diagnosis present

## 2016-04-20 DIAGNOSIS — Z96641 Presence of right artificial hip joint: Secondary | ICD-10-CM | POA: Diagnosis not present

## 2016-04-20 DIAGNOSIS — Z471 Aftercare following joint replacement surgery: Secondary | ICD-10-CM | POA: Diagnosis not present

## 2016-04-20 DIAGNOSIS — I10 Essential (primary) hypertension: Secondary | ICD-10-CM | POA: Diagnosis not present

## 2016-04-20 DIAGNOSIS — Z885 Allergy status to narcotic agent status: Secondary | ICD-10-CM | POA: Diagnosis not present

## 2016-04-20 DIAGNOSIS — Z888 Allergy status to other drugs, medicaments and biological substances status: Secondary | ICD-10-CM | POA: Diagnosis not present

## 2016-04-20 DIAGNOSIS — S73004D Unspecified dislocation of right hip, subsequent encounter: Secondary | ICD-10-CM | POA: Diagnosis not present

## 2016-05-15 DIAGNOSIS — D509 Iron deficiency anemia, unspecified: Secondary | ICD-10-CM | POA: Diagnosis not present

## 2016-05-15 DIAGNOSIS — I10 Essential (primary) hypertension: Secondary | ICD-10-CM | POA: Diagnosis not present

## 2016-05-15 DIAGNOSIS — G894 Chronic pain syndrome: Secondary | ICD-10-CM | POA: Diagnosis not present

## 2016-05-15 DIAGNOSIS — K219 Gastro-esophageal reflux disease without esophagitis: Secondary | ICD-10-CM | POA: Diagnosis not present

## 2016-05-15 DIAGNOSIS — Z5181 Encounter for therapeutic drug level monitoring: Secondary | ICD-10-CM | POA: Diagnosis not present

## 2016-05-15 DIAGNOSIS — F418 Other specified anxiety disorders: Secondary | ICD-10-CM | POA: Diagnosis not present

## 2016-05-15 DIAGNOSIS — E291 Testicular hypofunction: Secondary | ICD-10-CM | POA: Diagnosis not present

## 2016-05-15 DIAGNOSIS — E785 Hyperlipidemia, unspecified: Secondary | ICD-10-CM | POA: Diagnosis not present

## 2016-05-18 DIAGNOSIS — I10 Essential (primary) hypertension: Secondary | ICD-10-CM | POA: Diagnosis not present

## 2016-05-18 DIAGNOSIS — Z888 Allergy status to other drugs, medicaments and biological substances status: Secondary | ICD-10-CM | POA: Diagnosis not present

## 2016-05-18 DIAGNOSIS — Z7982 Long term (current) use of aspirin: Secondary | ICD-10-CM | POA: Diagnosis not present

## 2016-05-18 DIAGNOSIS — Z471 Aftercare following joint replacement surgery: Secondary | ICD-10-CM | POA: Diagnosis not present

## 2016-05-18 DIAGNOSIS — Z96641 Presence of right artificial hip joint: Secondary | ICD-10-CM | POA: Diagnosis not present

## 2016-06-23 DIAGNOSIS — S73004A Unspecified dislocation of right hip, initial encounter: Secondary | ICD-10-CM | POA: Diagnosis not present

## 2016-06-23 DIAGNOSIS — M25551 Pain in right hip: Secondary | ICD-10-CM | POA: Diagnosis not present

## 2016-06-23 DIAGNOSIS — Z4889 Encounter for other specified surgical aftercare: Secondary | ICD-10-CM | POA: Diagnosis not present

## 2016-06-23 DIAGNOSIS — Z9889 Other specified postprocedural states: Secondary | ICD-10-CM | POA: Diagnosis not present

## 2016-06-23 DIAGNOSIS — S73014A Posterior dislocation of right hip, initial encounter: Secondary | ICD-10-CM | POA: Diagnosis not present

## 2016-06-23 DIAGNOSIS — Z96641 Presence of right artificial hip joint: Secondary | ICD-10-CM | POA: Diagnosis not present

## 2016-06-23 DIAGNOSIS — T84020A Dislocation of internal right hip prosthesis, initial encounter: Secondary | ICD-10-CM | POA: Diagnosis not present

## 2016-07-11 ENCOUNTER — Emergency Department (HOSPITAL_COMMUNITY): Payer: Medicare Other

## 2016-07-11 ENCOUNTER — Emergency Department (HOSPITAL_COMMUNITY)
Admission: EM | Admit: 2016-07-11 | Discharge: 2016-07-11 | Disposition: A | Discharge status: Home / Self Care | Payer: Medicare Other | Attending: Emergency Medicine | Admitting: Emergency Medicine

## 2016-07-11 DIAGNOSIS — Z79899 Other long term (current) drug therapy: Secondary | ICD-10-CM | POA: Insufficient documentation

## 2016-07-11 DIAGNOSIS — S73004A Unspecified dislocation of right hip, initial encounter: Secondary | ICD-10-CM | POA: Diagnosis not present

## 2016-07-11 DIAGNOSIS — Z471 Aftercare following joint replacement surgery: Secondary | ICD-10-CM | POA: Diagnosis not present

## 2016-07-11 DIAGNOSIS — M25551 Pain in right hip: Secondary | ICD-10-CM | POA: Diagnosis not present

## 2016-07-11 DIAGNOSIS — X509XXA Other and unspecified overexertion or strenuous movements or postures, initial encounter: Secondary | ICD-10-CM | POA: Diagnosis not present

## 2016-07-11 DIAGNOSIS — Y999 Unspecified external cause status: Secondary | ICD-10-CM | POA: Insufficient documentation

## 2016-07-11 DIAGNOSIS — Y929 Unspecified place or not applicable: Secondary | ICD-10-CM | POA: Insufficient documentation

## 2016-07-11 DIAGNOSIS — Y9389 Activity, other specified: Secondary | ICD-10-CM | POA: Insufficient documentation

## 2016-07-11 DIAGNOSIS — Z96641 Presence of right artificial hip joint: Secondary | ICD-10-CM | POA: Insufficient documentation

## 2016-07-11 DIAGNOSIS — S73003A Unspecified subluxation of unspecified hip, initial encounter: Secondary | ICD-10-CM | POA: Diagnosis not present

## 2016-07-11 DIAGNOSIS — T84020A Dislocation of internal right hip prosthesis, initial encounter: Secondary | ICD-10-CM | POA: Diagnosis not present

## 2016-07-11 DIAGNOSIS — S79911A Unspecified injury of right hip, initial encounter: Secondary | ICD-10-CM | POA: Diagnosis present

## 2016-07-11 DIAGNOSIS — I10 Essential (primary) hypertension: Secondary | ICD-10-CM | POA: Diagnosis not present

## 2016-07-11 DIAGNOSIS — G8911 Acute pain due to trauma: Secondary | ICD-10-CM | POA: Diagnosis not present

## 2016-07-11 MED ORDER — PROPOFOL 10 MG/ML IV BOLUS
INTRAVENOUS | Status: AC | PRN
  Administered 2016-07-11: 30 mg via INTRAVENOUS
  Administered 2016-07-11: 20 mg via INTRAVENOUS
  Administered 2016-07-11 (×2): 30 mg via INTRAVENOUS

## 2016-07-11 MED ORDER — IBUPROFEN 800 MG PO TABS: 800.0000 mg | ORAL_TABLET | Freq: Three times a day (TID) | ORAL | 0 refills | Status: DC

## 2016-07-11 MED ORDER — KETAMINE HCL-SODIUM CHLORIDE 100-0.9 MG/10ML-% IV SOSY
PREFILLED_SYRINGE | INTRAVENOUS | Status: AC
  Filled 2016-07-11: qty 10

## 2016-07-11 MED ORDER — MIDAZOLAM HCL 2 MG/2ML IJ SOLN
INTRAMUSCULAR | Status: AC
  Filled 2016-07-11: qty 2

## 2016-07-11 MED ORDER — MIDAZOLAM HCL 2 MG/2ML IJ SOLN
INTRAMUSCULAR | Status: AC | PRN
  Administered 2016-07-11 (×2): 2 mg via INTRAVENOUS

## 2016-07-11 MED ORDER — MIDAZOLAM HCL 2 MG/2ML IJ SOLN
2.0000 mg | Freq: Once | INTRAMUSCULAR | Status: DC
  Filled 2016-07-11: qty 2

## 2016-07-11 MED ORDER — PROPOFOL 10 MG/ML IV BOLUS
0.5000 mg/kg | Freq: Once | INTRAVENOUS | Status: DC
  Filled 2016-07-11: qty 20

## 2016-07-11 MED ORDER — HYDROMORPHONE HCL 1 MG/ML IJ SOLN
INTRAMUSCULAR | Status: DC
Start: 2016-07-11 — End: 2016-07-11
  Filled 2016-07-11: qty 1

## 2016-07-11 MED ORDER — HYDROMORPHONE HCL 1 MG/ML IJ SOLN
INTRAMUSCULAR | Status: AC | PRN
  Administered 2016-07-11: 0.5 mg via INTRAVENOUS

## 2016-07-11 MED ORDER — KETAMINE HCL 10 MG/ML IJ SOLN
INTRAMUSCULAR | Status: AC | PRN
  Administered 2016-07-11: 50 mg via INTRAVENOUS

## 2016-07-11 NOTE — Discharge Instructions (Signed)

## 2016-07-11 NOTE — ED Notes (Signed)
Patient to xray.

## 2016-07-11 NOTE — ED Provider Notes (Signed)
[facility] Provider Note   CSN: [id number] Arrival date & time:        History   Chief Complaint Chief Complaint  Patient presents with  . Hip Injury    HPI ANTWUAN ECKLEY is a 46 y.o. male.  HPI  46 year old male who presents to the hospital after having recurrent acute onset of pain to the right hip after he bent over trying to take his shoes off last night. He had his hip replaced approximately 12 years ago, since that time he had multiple dislocations, he had a revision in January 2018 however since that time he has again dislocated his hip once and today is the second time. He felt acute onset of pain which is persistent, severe, worse with range of motion and associated with leg length shortening on the right side. This occurred a short time ago.  Past Medical History:  Diagnosis Date  . Drug addiction (HCC)   . GERD (gastroesophageal reflux disease)   . H/O hiatal hernia   . Hypertension     Patient Active Problem List   Diagnosis Date Noted  . Hypoxia 10/07/2015  . Mechanical complication of internal orthopedic device (HCC) 03/16/2015  . Hip dislocation, right (HCC) 02/18/2015  . Anemia 07/16/2014  . Altered mental status 10/20/2013  . Dislocation of hip prosthesis (HCC) 10/20/2013  . CAP (community acquired pneumonia) 10/20/2013  . Acute GI bleeding 09/19/2013  . Acute blood loss anemia 09/19/2013  . Essential hypertension 09/19/2013  . Sinus tachycardia 09/19/2013  . GASTROESOPHAGEAL REFLUX, NO ESOPHAGITIS 05/06/2006    Past Surgical History:  Procedure Laterality Date  . APPENDECTOMY     as child  . COLONOSCOPY WITH PROPOFOL  02/17/2012   Procedure: COLONOSCOPY WITH PROPOFOL;  Surgeon: Willis Modena, MD;  Location: [facility] ENDOSCOPY;  Service: Endoscopy;  Laterality: N/A;  . ESOPHAGOGASTRODUODENOSCOPY Left 09/19/2013   Procedure: ESOPHAGOGASTRODUODENOSCOPY (EGD);  Surgeon: Willis Modena, MD;  Location: [facility] ENDOSCOPY;  Service: Endoscopy;   Laterality: Left;  . ESOPHAGOGASTRODUODENOSCOPY (EGD) WITH PROPOFOL  02/17/2012   Procedure: ESOPHAGOGASTRODUODENOSCOPY (EGD) WITH PROPOFOL;  Surgeon: Willis Modena, MD;  Location: [facility] ENDOSCOPY;  Service: Endoscopy;  Laterality: N/A;  . HIP CLOSED REDUCTION Right 10/20/2013   Procedure: CLOSED REDUCTION HIP;  Surgeon: Eldred Manges, MD;  Location: [facility] OR;  Service: Orthopedics;  Laterality: Right;  . HIP CLOSED REDUCTION Right 02/18/2015   Procedure: CLOSED REDUCTION HIP;  Surgeon: Jodi Geralds, MD;  Location: [facility] ORS;  Service: Orthopedics;  Laterality: Right;  . HIP CLOSED REDUCTION Right 03/16/2015   Procedure: CLOSED REDUCTION HIP;  Surgeon: Kathryne Hitch, MD;  Location: [facility] OR;  Service: Orthopedics;  Laterality: Right;  . JOINT REPLACEMENT  2005   Right Hip       Home Medications    Prior to Admission medications   Medication Sig Start Date End Date Taking? Authorizing Provider  atorvastatin (LIPITOR) 40 MG tablet Take 40 mg by mouth daily.   Yes [provider]  clotrimazole (LOTRIMIN) 1 % cream Apply 1 application topically 3 (three) times daily.   Yes [provider]  FLUoxetine (PROZAC) 40 MG capsule Take 40 mg by mouth daily.   Yes [provider]  hydrOXYzine (VISTARIL) 25 MG capsule Take 25 mg by mouth daily as needed for anxiety.  06/27/16  Yes [provider]  loratadine (CLARITIN) 10 MG tablet Take 10 mg by mouth daily as needed for allergies.    Yes [provider]  Melatonin 10 MG TABS  Take 10 mg by mouth at bedtime as needed (sleep).    Yes [provider]  metoprolol (LOPRESSOR) 50 MG tablet Take 50 mg by mouth 2 (two) times daily.   Yes [provider]  Multiple Vitamin (MULTIVITAMIN) tablet Take 1 tablet by mouth daily.   Yes [provider]  oxyCODONE (ROXICODONE) 15 MG immediate release tablet Take 15 mg by mouth 3 (three) times daily as needed for pain.  06/17/16  Yes [provider]    OXYCONTIN 20 MG 12 hr tablet Take 20 mg by mouth 2 (two) times daily. 06/19/16  Yes [provider]  pantoprazole (PROTONIX) 40 MG tablet Take 40 mg by mouth 2 (two) times daily.   Yes [provider]  tiZANidine (ZANAFLEX) 4 MG tablet Take 4 mg by mouth 3 (three) times daily.  11/25/14  Yes [provider]  ibuprofen (ADVIL,MOTRIN) 800 MG tablet Take 1 tablet (800 mg total) by mouth 3 (three) times daily. 07/11/16   Eber HongMiller, Gloriajean Okun, MD    Family History Family History  Problem Relation Age of Onset  . Diabetes Mother     Social History Social History  Substance Use Topics  . Smoking status: Never Smoker  . Smokeless tobacco: Never Used  . Alcohol use No     Comment: social      Allergies   Ambien [zolpidem tartrate]; Nu-iron [polysaccharide iron complex]; Fentanyl; and Clonidine derivatives   Review of Systems Review of Systems  Musculoskeletal: Positive for joint swelling.  Skin: Negative for wound.  Neurological: Negative for numbness.     Physical Exam Updated Vital Signs BP 108/86   Pulse 65   Temp 98.6 F (37 C) (Oral)   Resp (!) 9   Ht 6\' 1"  (1.854 m)   Wt 234 lb (106.1 kg)   SpO2 99%   BMI 30.87 kg/m   Physical Exam  Constitutional: He appears well-developed and well-nourished.  HENT:  Head: Normocephalic and atraumatic.  Eyes: Conjunctivae are normal. Right eye exhibits no discharge. Left eye exhibits no discharge.  Pulmonary/Chest: Effort normal. No respiratory distress.  Musculoskeletal: He exhibits edema and deformity.  There is pain with range of motion of the right hip, it is shortened and slightly internally rotated. There is normal pulses at the feet bilaterally. There is bilateral mild edema.  Neurological: He is alert. Coordination normal.  No numbness at the feet bilaterally  Skin: Skin is warm and dry. No rash noted. He is not diaphoretic. No erythema.  Psychiatric: He has a normal mood and affect.  Nursing note and  vitals reviewed.    ED Treatments / Results  Labs (all labs ordered are listed, but only abnormal results are displayed) Labs Reviewed - No data to display  Radiology Dg Hip Unilat W Or Wo Pelvis 2-3 Views Right  Result Date: 07/11/2016 CLINICAL DATA:  Status post right hip reduction. EXAM: DG HIP (WITH OR WITHOUT PELVIS) 2-3V RIGHT COMPARISON:  Radiographs of same day. FINDINGS: There has been successful reduction of previously described dislocation involving right hip prosthesis. No acute fracture is noted. IMPRESSION: Successful reduction of previously described dislocation of right hip arthroplasty. Electronically Signed   By: Lupita RaiderJames  Green Jr, M.D.   On: 07/11/2016 09:58   Dg Hip Unilat W Or Wo Pelvis 2-3 Views Right  Result Date: 07/11/2016 CLINICAL DATA:  Acute right hip pain. EXAM: DG HIP (WITH OR WITHOUT PELVIS) 2-3V RIGHT COMPARISON:  Radiographs of March 23, 2015. FINDINGS: There is superior dislocation  of the right hip prosthesis. No definite fracture is noted. Sclerosis is noted in the left femoral head suggesting avascular necrosis. IMPRESSION: Dislocation of right hip prosthesis. 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 Electronically Signed   By: Lupita Raider, M.D.   On: 07/11/2016 08:26    Procedures Reduction of dislocation Date/Time: 07/11/2016 9:52 AM Performed by: Eber Hong Authorized by: Eber Hong  Consent: Verbal consent obtained. Written consent obtained. Risks and benefits: risks, benefits and alternatives were discussed Consent given by: patient Patient understanding: patient states understanding of the procedure being performed Patient consent: the patient's understanding of the procedure matches consent given Procedure consent: procedure consent matches procedure scheduled Test results: test results available and properly labeled Site marked: the operative site was marked Imaging studies: imaging studies available Required items: required  blood products, implants, devices, and special equipment available Patient identity confirmed: verbally with patient and arm band Time out: Immediately prior to procedure a "time out" was called to verify the correct patient, procedure, equipment, support staff and site/side marked as required. Local anesthesia used: no  Anesthesia: Local anesthesia used: no  Sedation: Patient sedated: yes Sedation type: moderate (conscious) sedation Sedatives: ketamine, propofol and midazolam Analgesia: hydromorphone Vitals: Vital signs were monitored during sedation. Patient tolerance: Patient tolerated the procedure well with no immediate complications Comments: Successful reduction of hip    (including critical care time)  Procedural sedation Performed by: Vida Roller Consent: Verbal consent obtained. Risks and benefits: risks, benefits and alternatives were discussed Required items: required blood products, implants, devices, and special equipment available Patient identity confirmed: arm band and provided demographic data Time out: Immediately prior to procedure a "time out" was called to verify the correct patient, procedure, equipment, support staff and site/side marked as required.  Sedation type: moderate (conscious) sedation NPO time confirmed and considedered  Sedatives: PROPOFOL (100mg ), Versed (4 mg), Ketamine (50mg ), Dilaudid for pain (0.5mg )  Physician Time at Bedside: 35  Vitals: Vital signs were monitored during sedation. Cardiac Monitor, pulse oximeter Patient tolerance: Patient tolerated the procedure well with no immediate complications. Comments: Pt with uneventful recovered. Returned to pre-procedural sedation baseline   Medications Ordered in ED Medications  propofol (DIPRIVAN) 10 mg/mL bolus/IV push 53.1 mg (not administered)  midazolam (VERSED) injection 2 mg (not administered)  midazolam (VERSED) 2 MG/2ML injection (not administered)  HYDROmorphone (DILAUDID)  1 MG/ML injection (not administered)  Ketamine HCl-Sodium Chloride 100-0.9 MG/10ML-% SOSY (not administered)  midazolam (VERSED) injection (2 mg Intravenous Given 07/11/16 0915)  propofol (DIPRIVAN) 10 mg/mL bolus/IV push (30 mg Intravenous Given 07/11/16 0918)  HYDROmorphone (DILAUDID) injection (0.5 mg Intravenous Given 07/11/16 0914)  ketamine (KETALAR) injection (50 mg Intravenous Given 07/11/16 0925)    Initial Impression / Assessment and Plan / ED Course  I have reviewed the triage vital signs and the nursing notes.  Pertinent labs & imaging results that were available during my care of the patient were reviewed by me and considered in my medical decision making (see chart for details).  Postreduction x-ray showed good hip location, radiology to confirm, the patient appears well, has normal leg lengths, knee immobilizer, anticipate discharge to follow-up with orthopedics in the outpatient setting   Clinical hip dislocation, will need x-ray and reduction.  Final Clinical Impressions(s) / ED Diagnoses   Final diagnoses:  Dislocation of right hip, initial encounter [facility])    New Prescriptions New Prescriptions   IBUPROFEN (ADVIL,MOTRIN) 800 MG TABLET    Take 1 tablet (800 mg total)  by mouth 3 (three) times daily.     Eber Hong, MD 07/11/16 [id number]

## 2016-07-11 NOTE — ED Notes (Signed)
Pt states he went to bed around 5 this morning and felt his hip "pop out".  Pt is sleeping and hard to arouse for assessment. Pt states he only took trazadone before he went to bed, has a script for oxycodone but hasn't taken any since yesterday afternoon. Pt answers questions but returns to sleep/snoring immediately after answering

## 2016-07-11 NOTE — ED Triage Notes (Signed)
Per EMS pt has has hip replacement in the past, revised in January of this year. This a.m. Around 5 he was going to bed and felt the hip dislocation. Pain 10/10.

## 2016-07-11 NOTE — Progress Notes (Signed)
Orthopedic Tech Progress Note Patient Details:  Todd HightJohn D [name] 07/19/1970 [id number]  Ortho Devices Type of Ortho Device: Knee Immobilizer Ortho Device/Splint Interventions: Application   Saul FordyceJennifer C Dravon Nott 07/11/2016, 10:04 AM

## 2016-07-11 NOTE — ED Notes (Signed)
Ortho at bedside.

## 2016-07-11 NOTE — ED Notes (Signed)
Consent signed.

## 2016-07-11 NOTE — ED Notes (Signed)
Attempted x1 for second IV access for conscious sedation.

## 2016-08-01 ENCOUNTER — Emergency Department (HOSPITAL_COMMUNITY): Payer: Medicare Other

## 2016-08-01 ENCOUNTER — Encounter (HOSPITAL_COMMUNITY): Payer: Self-pay

## 2016-08-01 ENCOUNTER — Emergency Department (HOSPITAL_COMMUNITY)
Admission: EM | Admit: 2016-08-01 | Discharge: 2016-08-01 | Disposition: A | Discharge status: Home / Self Care | Payer: Medicare Other | Attending: Emergency Medicine | Admitting: Emergency Medicine

## 2016-08-01 DIAGNOSIS — X509XXA Other and unspecified overexertion or strenuous movements or postures, initial encounter: Secondary | ICD-10-CM | POA: Diagnosis not present

## 2016-08-01 DIAGNOSIS — Y939 Activity, unspecified: Secondary | ICD-10-CM | POA: Diagnosis not present

## 2016-08-01 DIAGNOSIS — Y792 Prosthetic and other implants, materials and accessory orthopedic devices associated with adverse incidents: Secondary | ICD-10-CM | POA: Insufficient documentation

## 2016-08-01 DIAGNOSIS — Z79899 Other long term (current) drug therapy: Secondary | ICD-10-CM | POA: Diagnosis not present

## 2016-08-01 DIAGNOSIS — Z96641 Presence of right artificial hip joint: Secondary | ICD-10-CM | POA: Diagnosis not present

## 2016-08-01 DIAGNOSIS — T84020A Dislocation of internal right hip prosthesis, initial encounter: Secondary | ICD-10-CM | POA: Insufficient documentation

## 2016-08-01 DIAGNOSIS — Y929 Unspecified place or not applicable: Secondary | ICD-10-CM | POA: Insufficient documentation

## 2016-08-01 DIAGNOSIS — I10 Essential (primary) hypertension: Secondary | ICD-10-CM | POA: Insufficient documentation

## 2016-08-01 DIAGNOSIS — M25551 Pain in right hip: Secondary | ICD-10-CM | POA: Diagnosis not present

## 2016-08-01 DIAGNOSIS — Z471 Aftercare following joint replacement surgery: Secondary | ICD-10-CM | POA: Diagnosis not present

## 2016-08-01 DIAGNOSIS — Y999 Unspecified external cause status: Secondary | ICD-10-CM | POA: Diagnosis not present

## 2016-08-01 DIAGNOSIS — S73004A Unspecified dislocation of right hip, initial encounter: Secondary | ICD-10-CM

## 2016-08-01 DIAGNOSIS — R52 Pain, unspecified: Secondary | ICD-10-CM | POA: Diagnosis not present

## 2016-08-01 MED ORDER — PROPOFOL 10 MG/ML IV BOLUS
1.0000 mg/kg | Freq: Once | INTRAVENOUS | Status: AC
  Administered 2016-08-01: 60 mg via INTRAVENOUS
  Filled 2016-08-01: qty 20

## 2016-08-01 MED ORDER — HYDROMORPHONE HCL 1 MG/ML IJ SOLN
1.0000 mg | Freq: Once | INTRAMUSCULAR | Status: AC
  Administered 2016-08-01: 1 mg via INTRAVENOUS
  Filled 2016-08-01: qty 1

## 2016-08-01 MED ORDER — FENTANYL CITRATE (PF) 100 MCG/2ML IJ SOLN
INTRAMUSCULAR | Status: AC
  Filled 2016-08-01: qty 2

## 2016-08-01 MED ORDER — FENTANYL CITRATE (PF) 100 MCG/2ML IJ SOLN
50.0000 ug | Freq: Once | INTRAMUSCULAR | Status: AC
  Administered 2016-08-01: 50 ug via INTRAVENOUS

## 2016-08-01 MED ORDER — PROPOFOL 10 MG/ML IV BOLUS
INTRAVENOUS | Status: AC | PRN
  Administered 2016-08-01: 50 mg via INTRAVENOUS
  Administered 2016-08-01 (×2): 30 mg via INTRAVENOUS
  Administered 2016-08-01: 40 mg via INTRAVENOUS

## 2016-08-01 NOTE — ED Triage Notes (Signed)
Pt presents to the ed with complaints of his right hip being dislocated, obviously shortened and rotated.  Patient has a history of same. Was putting a sock on this morning and felt it pop out, pulses strong bilaterally. Received 200 mg of fentanyl in route.

## 2016-08-01 NOTE — Progress Notes (Signed)
Orthopedic Tech Progress Note Patient Details:  Todd HightJohn D [name] 12/23/1970 [id number]  Ortho Devices Type of Ortho Device: Knee Immobilizer Ortho Device/Splint Location: RLE Ortho Device/Splint Interventions: Ordered, Application   [name], [name] 08/01/2016, 7:58 PM

## 2016-08-01 NOTE — ED Notes (Signed)
Portable xr at bedside 

## 2016-08-01 NOTE — ED Provider Notes (Signed)
[facility]-EMERGENCY DEPT Provider Note   CSN: [id number] Arrival date & time: 08/01/16  1745     History   Chief Complaint Chief Complaint  Patient presents with  . hip dislocation    HPI HABEEB PUERTAS is a 46 y.o. male.  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 Rolley Sims, several hip dislocations since that time, presents with concern for right hip reduction while putting on his socks. Patient with history of several hip reductions in the past. Reports he wore his immobilizer for a few days after the last one and is careful about his movements, but today reports he was rushing to go spend time with his sons, got out of shower and put his socks on and felt it pop out. Pain is severe, improved with fentanyl in route, worsened by movement. No numbness, no other injuries or pain. No recent illness.   Past Medical History:  Diagnosis Date  . Drug addiction (HCC)   . GERD (gastroesophageal reflux disease)   . H/O hiatal hernia   . Hypertension     Patient Active Problem List   Diagnosis Date Noted  . Hypoxia 10/07/2015  . Mechanical complication of internal orthopedic device (HCC) 03/16/2015  . Hip dislocation, right (HCC) 02/18/2015  . Anemia 07/16/2014  . Altered mental status 10/20/2013  . Dislocation of hip prosthesis (HCC) 10/20/2013  . CAP (community acquired pneumonia) 10/20/2013  . Acute GI bleeding 09/19/2013  . Acute blood loss anemia 09/19/2013  . Essential hypertension 09/19/2013  . Sinus tachycardia 09/19/2013  . GASTROESOPHAGEAL REFLUX, NO ESOPHAGITIS 05/06/2006    Past Surgical History:  Procedure Laterality Date  . APPENDECTOMY     as child  . COLONOSCOPY WITH PROPOFOL  02/17/2012   Procedure: COLONOSCOPY WITH PROPOFOL;  Surgeon: Willis Modena, MD;  Location: [facility];  Service: Endoscopy;  Laterality: N/A;  .  ESOPHAGOGASTRODUODENOSCOPY Left 09/19/2013   Procedure: ESOPHAGOGASTRODUODENOSCOPY (EGD);  Surgeon: Willis Modena, MD;  Location: [facility] ENDOSCOPY;  Service: Endoscopy;  Laterality: Left;  . ESOPHAGOGASTRODUODENOSCOPY (EGD) WITH PROPOFOL  02/17/2012   Procedure: ESOPHAGOGASTRODUODENOSCOPY (EGD) WITH PROPOFOL;  Surgeon: Willis Modena, MD;  Location: [facility];  Service: Endoscopy;  Laterality: N/A;  . HIP CLOSED REDUCTION Right 10/20/2013   Procedure: CLOSED REDUCTION HIP;  Surgeon: Eldred Manges, MD;  Location: [facility] OR;  Service: Orthopedics;  Laterality: Right;  . HIP CLOSED REDUCTION Right 02/18/2015   Procedure: CLOSED REDUCTION HIP;  Surgeon: Jodi Geralds, MD;  Location: [facility] ORS;  Service: Orthopedics;  Laterality: Right;  . HIP CLOSED REDUCTION Right 03/16/2015   Procedure: CLOSED REDUCTION HIP;  Surgeon: Kathryne Hitch, MD;  Location: [facility] OR;  Service: Orthopedics;  Laterality: Right;  . JOINT REPLACEMENT  2005   Right Hip       Home Medications    Prior to Admission medications   Medication Sig Start Date End Date Taking? Authorizing Provider  atorvastatin (LIPITOR) 40 MG tablet Take 40 mg by mouth daily.    [provider]  clotrimazole (LOTRIMIN) 1 % cream Apply 1 application topically 3 (three) times daily.    [provider]  FLUoxetine (PROZAC) 40 MG capsule Take 40 mg by mouth daily.    [provider]  hydrOXYzine (VISTARIL) 25 MG capsule Take 25 mg by mouth daily as needed for anxiety.  06/27/16   [provider]  ibuprofen (ADVIL,MOTRIN) 800 MG tablet  Take 1 tablet (800 mg total) by mouth 3 (three) times daily. 07/11/16   Eber HongMiller, Brian, MD  loratadine (CLARITIN) 10 MG tablet Take 10 mg by mouth daily as needed for allergies.     [provider]  Melatonin 10 MG TABS Take 10 mg by mouth at bedtime as needed (sleep).     [provider]  metoprolol (LOPRESSOR) 50 MG tablet Take 50 mg by mouth 2 (two) times daily.    [provider]  Multiple Vitamin (MULTIVITAMIN) tablet Take 1 tablet by mouth daily.    [provider]  oxyCODONE (ROXICODONE) 15 MG immediate release tablet Take 15 mg by mouth 3 (three) times daily as needed for pain.  06/17/16   [provider]  OXYCONTIN 20 MG 12 hr tablet Take 20 mg by mouth 2 (two) times daily. 06/19/16   [provider]  pantoprazole (PROTONIX) 40 MG tablet Take 40 mg by mouth 2 (two) times daily.    [provider]  tiZANidine (ZANAFLEX) 4 MG tablet Take 4 mg by mouth 3 (three) times daily.  11/25/14   [provider]    Family History Family History  Problem Relation Age of Onset  . Diabetes Mother     Social History Social History  Substance Use Topics  . Smoking status: Never Smoker  . Smokeless tobacco: Never Used  . Alcohol use No     Comment: social      Allergies   Ambien [zolpidem tartrate]; Nu-iron [polysaccharide iron complex]; Fentanyl; and Clonidine derivatives   Review of Systems Review of Systems  Constitutional: Negative for fever.  HENT: Negative for sore throat.   Eyes: Negative for visual disturbance.  Respiratory: Negative for shortness of breath.   Cardiovascular: Negative for chest pain.  Gastrointestinal: Negative for abdominal pain.  Genitourinary: Negative for difficulty urinating.  Musculoskeletal: Positive for arthralgias and gait problem. Negative for neck stiffness.  Skin: Negative for rash.  Neurological: Negative for syncope and headaches.     Physical Exam Updated Vital Signs BP (!) 156/90   Pulse 61   Temp 98.1 F (36.7 C) (Oral)   Resp 10   Wt 106.1 kg (234 lb)   SpO2 100%   BMI 30.87 kg/m   Physical Exam  Constitutional: He is oriented to person, place, and time. He appears well-developed and well-nourished. No distress.  HENT:  Head: Normocephalic and atraumatic.  Eyes: Conjunctivae and EOM are normal.  Neck: Normal range of motion.  Cardiovascular:  Normal rate, regular rhythm, normal heart sounds and intact distal pulses.  Exam reveals no gallop and no friction rub.   No murmur heard. Pulmonary/Chest: Effort normal and breath sounds normal. No respiratory distress. He has no wheezes. He has no rales.  Abdominal: Soft. He exhibits no distension. There is no tenderness. There is no guarding.  Musculoskeletal: He exhibits no edema.       Right hip: He exhibits decreased range of motion and bony tenderness.       Right ankle: He exhibits normal pulse.  Shortened internally rotated Normal movement of feet, normal sensation  Neurological: He is alert and oriented to person, place, and time.  Skin: Skin is warm and dry. He is not diaphoretic.  Nursing note and vitals reviewed.    ED Treatments / Results  Labs (all labs ordered are listed, but only abnormal results are displayed) Labs Reviewed - No data to display  EKG  EKG Interpretation None  Radiology Dg Hip Daphne W Or [place] Pelvis 1 View Right  Result Date: 08/01/2016 CLINICAL DATA:  Right hip reduction. EXAM: DG HIP (WITH OR WITHOUT PELVIS) 1V PORT RIGHT COMPARISON:  Plain film from earlier same day. FINDINGS: The femoral head component of the right hip prosthesis now appears appropriately positioned relative to the acetabular cup. No osseous fracture or dislocation seen. IMPRESSION: Femoral head component of the right hip prosthesis now appropriately positioned relative to the acetabular cup, status post interval reduction. Electronically Signed   By: Bary Richard M.D.   On: 08/01/2016 19:50   Dg Hip Port Unilat W Or Wo Pelvis 1 View Right  Result Date: 08/01/2016 CLINICAL DATA:  Right hip replacement January 2018. Dislocated right hip. EXAM: DG HIP (WITH OR WITHOUT PELVIS) 1V PORT RIGHT COMPARISON:  None. FINDINGS: Right total hip arthroplasty. No acute fracture. Superior dislocation of the right femoral head relative to the acetabular cup. IMPRESSION: Superior right  femoral head dislocation. Electronically Signed   By: Elige Ko   On: 08/01/2016 18:31    Procedures .Sedation Date/Time: 08/02/2016 12:17 PM Performed by: Alvira Monday Authorized by: Alvira Monday   Consent:    Consent obtained:  Written   Consent given by:  Patient   Risks discussed:  Dysrhythmia, allergic reaction, inadequate sedation, nausea, prolonged hypoxia resulting in organ damage, prolonged sedation necessitating reversal, respiratory compromise necessitating ventilatory assistance and intubation and vomiting Indications:    Procedure performed:  Dislocation reduction   Procedure necessitating sedation performed by:  Physician performing sedation   Intended level of sedation:  Moderate (conscious sedation) Pre-sedation assessment:    ASA classification: class 1 - normal, healthy patient     Neck mobility: normal     Mouth opening:  3 or more finger widths   Thyromental distance:  4 finger widths   Mallampati score:  I - soft palate, uvula, fauces, pillars visible   Pre-sedation assessments completed and reviewed: airway patency, cardiovascular function, hydration status, mental status, nausea/vomiting and pain level     History of difficult intubation: no   Immediate pre-procedure details:    Reviewed: vital signs     Verified: bag valve mask available, emergency equipment available, intubation equipment available, IV patency confirmed, oxygen available and suction available   Procedure details (see MAR for exact dosages):    Sedation start time:  08/01/2016 7:12 PM   Preoxygenation:  Nasal cannula   Sedation:  Propofol   Analgesia:  Fentanyl   Sedation end time:  08/01/2016 7:27 PM   Total sedation time (minutes):  15 Post-procedure details:    Post-sedation assessment completed:  08/01/2016 8:00 PM   Attendance: Constant attendance by certified staff until patient recovered     Recovery: Patient returned to pre-procedure baseline     Post-sedation assessments  completed and reviewed: cardiovascular function, hydration status, mental status and pain level     Specimens recovered:  None   Patient is stable for discharge or admission: no     Patient tolerance:  Tolerated well, no immediate complications   (including critical care time)  Medications Ordered in ED Medications  HYDROmorphone (DILAUDID) injection 1 mg (1 mg Intravenous Given 08/01/16 1812)  propofol (DIPRIVAN) 10 mg/mL bolus/IV push 106.1 mg (60 mg Intravenous Given 08/01/16 1915)  propofol (DIPRIVAN) 10 mg/mL bolus/IV push (50 mg Intravenous Given 08/01/16 1921)  fentaNYL (SUBLIMAZE) injection 50 mcg (50 mcg Intravenous Given 08/01/16 1959)     Initial Impression / Assessment and Plan / ED  Course  I have reviewed the triage vital signs and the nursing notes.  Pertinent labs & imaging results that were available during my care of the patient were reviewed by me and considered in my medical decision making (see chart for details).     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 Rolley Sims, several hip dislocations since that time, presents with concern for right hip reduction while putting on his socks.  XR confirms dislocation. Hip reduced under propofol sedation by me and Audry Pili PA-C.  Patient quickly returned to baseline. Knee immobilizer placed, recommend close Orthopedic follow up. Patient discharged in stable condition with understanding of reasons to return.    Final Clinical Impressions(s) / ED Diagnoses   Final diagnoses:  Closed dislocation of right hip, initial encounter [facility])    New Prescriptions Discharge Medication List as of 08/01/2016  8:04 PM       Alvira Monday, MD 08/02/16 1227

## 2016-08-01 NOTE — ED Provider Notes (Signed)
  Physical Exam  BP (!) 143/81   Pulse 64   Temp 98.1 F (36.7 C) (Oral)   Resp 15   Wt 106.1 kg (234 lb)   SpO2 100%   BMI 30.87 kg/m   Physical Exam  ED Course  Reduction of Hip Dislocation Date/Time: 08/01/2016 7:45 PM Performed by: Audry PiliMOHR, Thorne Wirz Authorized by: Audry PiliMOHR, Keller Bounds  Consent: Verbal consent obtained. Written consent obtained. Risks and benefits: risks, benefits and alternatives were discussed Consent given by: patient Patient understanding: patient states understanding of the procedure being performed Patient consent: the patient's understanding of the procedure matches consent given Procedure consent: procedure consent matches procedure scheduled Relevant documents: relevant documents present and verified Site marked: the operative site was marked Patient identity confirmed: verbally with patient and arm band Time out: Immediately prior to procedure a "time out" was called to verify the correct patient, procedure, equipment, support staff and site/side marked as required.  Sedation: Patient sedated: yes Sedatives: propofol Vitals: Vital signs were monitored during sedation. Patient tolerance: Patient tolerated the procedure well with no immediate complications Comments: Successful reduction        Audry PiliMohr, Adahlia Stembridge, PA-C 08/01/16 1945    Alvira MondaySchlossman, Erin, MD 08/02/16 [phone]

## 2016-08-01 NOTE — ED Notes (Signed)
Port xr at bedside °

## 2016-08-11 DIAGNOSIS — M160 Bilateral primary osteoarthritis of hip: Secondary | ICD-10-CM | POA: Diagnosis not present

## 2016-08-11 DIAGNOSIS — G894 Chronic pain syndrome: Secondary | ICD-10-CM | POA: Diagnosis not present

## 2016-08-27 DIAGNOSIS — M25551 Pain in right hip: Secondary | ICD-10-CM | POA: Diagnosis not present

## 2016-08-27 DIAGNOSIS — R52 Pain, unspecified: Secondary | ICD-10-CM | POA: Diagnosis not present

## 2016-08-27 DIAGNOSIS — Z96641 Presence of right artificial hip joint: Secondary | ICD-10-CM | POA: Diagnosis not present

## 2016-08-27 DIAGNOSIS — S73004A Unspecified dislocation of right hip, initial encounter: Secondary | ICD-10-CM | POA: Diagnosis not present

## 2016-08-27 DIAGNOSIS — T84020A Dislocation of internal right hip prosthesis, initial encounter: Secondary | ICD-10-CM | POA: Diagnosis not present

## 2016-08-27 DIAGNOSIS — S79911A Unspecified injury of right hip, initial encounter: Secondary | ICD-10-CM | POA: Diagnosis not present

## 2016-09-01 DIAGNOSIS — R972 Elevated prostate specific antigen [PSA]: Secondary | ICD-10-CM | POA: Diagnosis not present

## 2016-09-01 DIAGNOSIS — K219 Gastro-esophageal reflux disease without esophagitis: Secondary | ICD-10-CM | POA: Diagnosis not present

## 2016-09-01 DIAGNOSIS — D509 Iron deficiency anemia, unspecified: Secondary | ICD-10-CM | POA: Diagnosis not present

## 2016-09-01 DIAGNOSIS — E291 Testicular hypofunction: Secondary | ICD-10-CM | POA: Diagnosis not present

## 2016-09-01 DIAGNOSIS — E559 Vitamin D deficiency, unspecified: Secondary | ICD-10-CM | POA: Diagnosis not present

## 2016-09-01 DIAGNOSIS — R5383 Other fatigue: Secondary | ICD-10-CM | POA: Diagnosis not present

## 2016-09-01 DIAGNOSIS — G894 Chronic pain syndrome: Secondary | ICD-10-CM | POA: Diagnosis not present

## 2016-09-01 DIAGNOSIS — I10 Essential (primary) hypertension: Secondary | ICD-10-CM | POA: Diagnosis not present

## 2016-09-01 DIAGNOSIS — Z5181 Encounter for therapeutic drug level monitoring: Secondary | ICD-10-CM | POA: Diagnosis not present

## 2016-09-01 DIAGNOSIS — F418 Other specified anxiety disorders: Secondary | ICD-10-CM | POA: Diagnosis not present

## 2016-09-01 DIAGNOSIS — E785 Hyperlipidemia, unspecified: Secondary | ICD-10-CM | POA: Diagnosis not present

## 2016-09-19 ENCOUNTER — Emergency Department (HOSPITAL_COMMUNITY)
Admission: EM | Admit: 2016-09-19 | Discharge: 2016-09-19 | Disposition: A | Discharge status: Home / Self Care | Payer: Medicare Other | Attending: Emergency Medicine | Admitting: Emergency Medicine

## 2016-09-19 ENCOUNTER — Encounter (HOSPITAL_COMMUNITY): Payer: Self-pay | Admitting: Emergency Medicine

## 2016-09-19 ENCOUNTER — Emergency Department (HOSPITAL_COMMUNITY): Payer: Medicare Other

## 2016-09-19 DIAGNOSIS — Y69 Unspecified misadventure during surgical and medical care: Secondary | ICD-10-CM | POA: Insufficient documentation

## 2016-09-19 DIAGNOSIS — Y9389 Activity, other specified: Secondary | ICD-10-CM | POA: Insufficient documentation

## 2016-09-19 DIAGNOSIS — X509XXA Other and unspecified overexertion or strenuous movements or postures, initial encounter: Secondary | ICD-10-CM | POA: Diagnosis not present

## 2016-09-19 DIAGNOSIS — Y929 Unspecified place or not applicable: Secondary | ICD-10-CM | POA: Insufficient documentation

## 2016-09-19 DIAGNOSIS — M25559 Pain in unspecified hip: Secondary | ICD-10-CM

## 2016-09-19 DIAGNOSIS — T84020A Dislocation of internal right hip prosthesis, initial encounter: Secondary | ICD-10-CM | POA: Diagnosis not present

## 2016-09-19 DIAGNOSIS — Z96641 Presence of right artificial hip joint: Secondary | ICD-10-CM | POA: Diagnosis not present

## 2016-09-19 DIAGNOSIS — Y998 Other external cause status: Secondary | ICD-10-CM | POA: Insufficient documentation

## 2016-09-19 DIAGNOSIS — S73004A Unspecified dislocation of right hip, initial encounter: Secondary | ICD-10-CM | POA: Diagnosis not present

## 2016-09-19 DIAGNOSIS — M25551 Pain in right hip: Secondary | ICD-10-CM | POA: Diagnosis not present

## 2016-09-19 DIAGNOSIS — Z79899 Other long term (current) drug therapy: Secondary | ICD-10-CM | POA: Insufficient documentation

## 2016-09-19 DIAGNOSIS — Z791 Long term (current) use of non-steroidal anti-inflammatories (NSAID): Secondary | ICD-10-CM | POA: Insufficient documentation

## 2016-09-19 DIAGNOSIS — I10 Essential (primary) hypertension: Secondary | ICD-10-CM | POA: Insufficient documentation

## 2016-09-19 DIAGNOSIS — S73001A Unspecified subluxation of right hip, initial encounter: Secondary | ICD-10-CM | POA: Diagnosis not present

## 2016-09-19 DIAGNOSIS — T148XXA Other injury of unspecified body region, initial encounter: Secondary | ICD-10-CM | POA: Diagnosis not present

## 2016-09-19 MED ORDER — FENTANYL CITRATE (PF) 100 MCG/2ML IJ SOLN: 50.0000 ug | Freq: Once | INTRAMUSCULAR | Status: DC

## 2016-09-19 MED ORDER — FENTANYL CITRATE (PF) 100 MCG/2ML IJ SOLN
50.0000 ug | Freq: Once | INTRAMUSCULAR | Status: AC
  Administered 2016-09-19: 50 ug via INTRAVENOUS
  Filled 2016-09-19: qty 2

## 2016-09-19 MED ORDER — PROPOFOL 10 MG/ML IV BOLUS
INTRAVENOUS | Status: AC | PRN
  Administered 2016-09-19 (×2): 30 mg via INTRAVENOUS
  Administered 2016-09-19 (×2): 20 mg via INTRAVENOUS

## 2016-09-19 MED ORDER — PROPOFOL 10 MG/ML IV BOLUS
0.5000 mg/kg | Freq: Once | INTRAVENOUS | Status: AC
  Administered 2016-09-19: 100 mg via INTRAVENOUS
  Filled 2016-09-19: qty 20

## 2016-09-19 NOTE — ED Provider Notes (Signed)
[facility]-EMERG[facility]Y DEPT Provider Note   CSN: [id number] Arrival date & time: 09/19/16  1420     History   Chief Complaint No chief complaint on file.   HPI Todd [name] is a 46 y.o. male.  The history is provided by the patient. No language interpreter was used.   [name] is a 46 y.o. male who presents to the Emergency Department complaining of hip dislocation. He has a history of recurrent hip dislocations and had a revision surgery in January. Since his recent revision he has had 5 recurrent dislocations, last one was about a month ago. He was in his routine state of health when he was working on a roof trying to locate a leak when he went to sit down and felt his leg pop out. He reports pain to his right hip, similar to prior dislocations. He denies any additional symptoms. Past Medical History:  Diagnosis Date  . Drug addiction (HCC)   . GERD (gastroesophageal reflux disease)   . H/O hiatal hernia   . Hypertension     Patient Active Problem List   Diagnosis Date Noted  . Hypoxia 10/07/2015  . Mechanical complication of internal orthopedic device (HCC) 03/16/2015  . Hip dislocation, right (HCC) 02/18/2015  . Anemia 07/16/2014  . Altered mental status 10/20/2013  . Dislocation of hip prosthesis (HCC) 10/20/2013  . CAP (community acquired pneumonia) 10/20/2013  . Acute GI bleeding 09/19/2013  . Acute blood loss anemia 09/19/2013  . Essential hypertension 09/19/2013  . Sinus tachycardia 09/19/2013  . GASTROESOPHAGEAL REFLUX, NO ESOPHAGITIS 05/06/2006    Past Surgical History:  Procedure Laterality Date  . APPENDECTOMY     as child  . COLONOSCOPY WITH PROPOFOL  02/17/2012   Procedure: COLONOSCOPY WITH PROPOFOL;  Surgeon: Willis Modena, MD;  Location: [facility] ENDOSCOPY;  Service: Endoscopy;  Laterality: N/A;  . ESOPHAGOGASTRODUODENOSCOPY Left 09/19/2013   Procedure: ESOPHAGOGASTRODUODENOSCOPY (EGD);  Surgeon: Willis Modena, MD;  Location: [facility] ENDOSCOPY;  Service:  Endoscopy;  Laterality: Left;  . ESOPHAGOGASTRODUODENOSCOPY (EGD) WITH PROPOFOL  02/17/2012   Procedure: ESOPHAGOGASTRODUODENOSCOPY (EGD) WITH PROPOFOL;  Surgeon: Willis Modena, MD;  Location: [facility] ENDOSCOPY;  Service: Endoscopy;  Laterality: N/A;  . HIP CLOSED REDUCTION Right 10/20/2013   Procedure: CLOSED REDUCTION HIP;  Surgeon: Eldred Manges, MD;  Location: [facility] OR;  Service: Orthopedics;  Laterality: Right;  . HIP CLOSED REDUCTION Right 02/18/2015   Procedure: CLOSED REDUCTION HIP;  Surgeon: Jodi Geralds, MD;  Location: [facility] ORS;  Service: Orthopedics;  Laterality: Right;  . HIP CLOSED REDUCTION Right 03/16/2015   Procedure: CLOSED REDUCTION HIP;  Surgeon: Kathryne Hitch, MD;  Location: [facility] OR;  Service: Orthopedics;  Laterality: Right;  . JOINT REPLACEMENT  2005   Right Hip       Home Medications    Prior to Admission medications   Medication Sig Start Date End Date Taking? Authorizing Provider  atorvastatin (LIPITOR) 40 MG tablet Take 40 mg by mouth daily.    [provider]  clotrimazole (LOTRIMIN) 1 % cream Apply 1 application topically 3 (three) times daily.    [provider]  FLUoxetine (PROZAC) 40 MG capsule Take 40 mg by mouth daily.    [provider]  hydrOXYzine (VISTARIL) 25 MG capsule Take 25 mg by mouth daily as needed for anxiety.  06/27/16   [provider]  ibuprofen (ADVIL,MOTRIN) 800 MG tablet Take 1 tablet (800 mg total) by mouth 3 (three) times daily. 07/11/16   Eber Hong, MD  loratadine (  CLARITIN) 10 MG tablet Take 10 mg by mouth daily as needed for allergies.     [provider]  Melatonin 10 MG TABS Take 10 mg by mouth at bedtime as needed (sleep).     [provider]  metoprolol (LOPRESSOR) 50 MG tablet Take 50 mg by mouth 2 (two) times daily.    [provider]  Multiple Vitamin (MULTIVITAMIN) tablet Take 1 tablet by mouth daily.    [provider]  oxyCODONE (ROXICODONE) 15 MG immediate  release tablet Take 15 mg by mouth 3 (three) times daily as needed for pain.  06/17/16   [provider]  OXYCONTIN 20 MG 12 hr tablet Take 20 mg by mouth 2 (two) times daily. 06/19/16   [provider]  pantoprazole (PROTONIX) 40 MG tablet Take 40 mg by mouth 2 (two) times daily.    [provider]  tiZANidine (ZANAFLEX) 4 MG tablet Take 4 mg by mouth 3 (three) times daily.  11/25/14   [provider]    Family History Family History  Problem Relation Age of Onset  . Diabetes Mother     Social History Social History  Substance Use Topics  . Smoking status: Never Smoker  . Smokeless tobacco: Never Used  . Alcohol use No     Comment: social      Allergies   Ambien [zolpidem tartrate]; Nu-iron [polysaccharide iron complex]; Fentanyl; and Clonidine derivatives   Review of Systems Review of Systems  All other systems reviewed and are negative.    Physical Exam Updated Vital Signs BP 120/83   Pulse 67   Temp 98.7 F (37.1 C) (Oral)   Resp 17   Ht 6\' 2"  (1.88 m)   Wt 102.5 kg (226 lb)   SpO2 100%   BMI 29.02 kg/m   Physical Exam  Constitutional: He is oriented to person, place, and time. He appears well-developed and well-nourished.  HENT:  Head: Normocephalic and atraumatic.  Cardiovascular: Normal rate and regular rhythm.   No murmur heard. Pulmonary/Chest: Effort normal and breath sounds normal. No respiratory distress.  Abdominal: Soft. There is no tenderness. There is no rebound and no guarding.  Musculoskeletal:  2+ DP pulses bilaterally.  RLE internally rotated and shortened.   Neurological: He is alert and oriented to person, place, and time.  Skin: Skin is warm and dry.  Psychiatric: He has a normal mood and affect. His behavior is normal.  Nursing note and vitals reviewed.    ED Treatments / Results  Labs (all labs ordered are listed, but only abnormal results are displayed) Labs Reviewed - No data to  display  EKG  EKG Interpretation None       Radiology Dg Hip Port Unilat With Pelvis 1v Right  Result Date: 09/19/2016 CLINICAL DATA:  Right hip pain, fall from roof EXAM: DG HIP (WITH OR WITHOUT PELVIS) 1V PORT RIGHT COMPARISON:  08/01/2016 pelvic radiograph FINDINGS: Status post right total hip arthroplasty with partially visualized extended right proximal femoral prosthetic stem. There is superolateral dislocation of the right femoral head prosthesis at the right hip joint. No evidence of hardware fracture. No acute osseous fracture. No suspicious focal osseous lesions. 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 IMPRESSION: 1. Superolateral right hip dislocation. No appreciable hardware or osseous fracture. 2. Left femoral head AVN. Electronically Signed   By: Delbert Phenix M.D.   On: 09/19/2016 14:59    Procedures Procedures (including critical care time)  Medications Ordered in  ED Medications  fentaNYL (SUBLIMAZE) injection 50 mcg (not administered)  propofol (DIPRIVAN) 10 mg/mL bolus/IV push 51.3 mg (100 mg Intravenous Given 09/19/16 1547)  fentaNYL (SUBLIMAZE) injection 50 mcg (50 mcg Intravenous Given 09/19/16 1543)  propofol (DIPRIVAN) 10 mg/mL bolus/IV push (30 mg Intravenous Given 09/19/16 1552)   Procedural sedation Performed by: Tilden FossaElizabeth Klaryssa Fauth Consent: Verbal consent obtained. Risks and benefits: risks, benefits and alternatives were discussed Required items: required blood products, implants, devices, and special equipment available Patient identity confirmed: arm band and provided demographic data Time out: Immediately prior to procedure a "time out" was called to verify the correct patient, procedure, equipment, support staff and site/side marked as required.  Sedation type: moderate (conscious) sedation NPO time confirmed and considedered  Sedatives: PROPOFOL  Physician Time at Bedside: 20 minutes  Vitals: Vital signs were monitored during sedation.  Cardiac Monitor, pulse oximeter Patient tolerance: Patient tolerated the procedure well with no immediate complications. Comments: Pt with uneventful recovered. Returned to pre-procedural sedation baseline  Sedation provided by myself and reduction assisted by PA, Audry Piliyler Mohr   Initial Impression / Assessment and Plan / ED Course  I have reviewed the triage vital signs and the nursing notes.  Pertinent labs & imaging results that were available during my care of the patient were reviewed by me and considered in my medical decision making (see chart for details).     Patient with history of recurrent right hip dislocation E here for recurrent right hip dislocation. He is neurovascularly intact on examination. Hip reduced in the emergency department. Post reduction patient had 2+ DP pulses. Patient refused post reduction films or a knee immobilizer. Recommended further observation given recent sedation and patient refused. He was discharged home in the care of his fiance.  Final Clinical Impressions(s) / ED Diagnoses   Final diagnoses:  Hip pain  Closed dislocation of right hip, initial encounter [facility](HCC)    New Prescriptions Discharge Medication List as of 09/19/2016  4:18 PM       Tilden Fossaees, Eluterio Seymour, MD 09/19/16 1724

## 2016-09-19 NOTE — ED Triage Notes (Signed)
[facility], history of same.  Patient was trying to sit down on a roof when it popped out.  Received 50mcg fentanyl intranasally, 50mcg IV.  Right leg rotated leftward.

## 2016-09-19 NOTE — Sedation Documentation (Signed)
Reduction attempt by Dr Madilyn Hookrees and Joselyn Glassmanyler PA

## 2016-09-19 NOTE — ED Provider Notes (Signed)
  Physical Exam  BP (!) 134/96   Pulse 60   Temp 98.7 F (37.1 C) (Oral)   Resp 15   Ht 6\' 2"  (1.88 m)   Wt 102.5 kg (226 lb)   SpO2 98%   BMI 29.02 kg/m   Physical Exam  ED Course  Reduction of hip dislocation Date/Time: 09/19/2016 3:58 PM Performed by: Audry PiliMOHR, Ellarose Brandi Authorized by: Audry PiliMOHR, Tracee Mccreery  Consent: Verbal consent obtained. Written consent obtained. Risks and benefits: risks, benefits and alternatives were discussed Consent given by: patient Patient understanding: patient states understanding of the procedure being performed Patient consent: the patient's understanding of the procedure matches consent given Procedure consent: procedure consent matches procedure scheduled Relevant documents: relevant documents present and verified Required items: required blood products, implants, devices, and special equipment available Patient identity confirmed: verbally with patient and arm band Time out: Immediately prior to procedure a "time out" was called to verify the correct patient, procedure, equipment, support staff and site/side marked as required.  Sedation: Patient sedated: yes Sedation type: moderate (conscious) sedation Sedatives: propofol Vitals: Vital signs were monitored during sedation. Patient tolerance: Patient tolerated the procedure well with no immediate complications Comments: Successful reduction of right hip dislocation           Audry PiliMohr, Tomie Spizzirri, PA-C 09/19/16 1559    Tilden Fossaees, Elizabeth, MD 09/20/16 [id number]

## 2016-09-19 NOTE — Progress Notes (Signed)
Orthopedic Tech Progress Note Patient Details:  Caro HightJohn D Addison 07/12/1970 [id number]  Patient ID: Caro HightJohn D Pell, male   DOB: 10/31/1970, 46 y.o.   MRN: [id number]   Saul FordyceJennifer C Galdino Hinchman 09/19/2016, 4:29 PMhip reduction.

## 2016-09-19 NOTE — Sedation Documentation (Addendum)
Pt threatening to leave, taking off all monitors, Dr Madilyn Hookees called into room to talk to pt. Pt refusing xray post procedure. Dr Madilyn Hookrees explained risks of leaving at this time

## 2016-09-19 NOTE — Sedation Documentation (Signed)
Reduction successful by Dr Madilyn Hookees and Joselyn Glassmanyler PA

## 2016-09-19 NOTE — Sedation Documentation (Signed)
2nd reduction attempt made by Dr Madilyn Hookees and Joselyn Glassmanyler PA

## 2016-09-19 NOTE — Sedation Documentation (Signed)
Xray called to do portable xray

## 2016-09-25 DIAGNOSIS — T84020A Dislocation of internal right hip prosthesis, initial encounter: Secondary | ICD-10-CM | POA: Diagnosis not present

## 2016-09-25 DIAGNOSIS — M87851 Other osteonecrosis, right femur: Secondary | ICD-10-CM | POA: Diagnosis not present

## 2016-09-25 DIAGNOSIS — S79911A Unspecified injury of right hip, initial encounter: Secondary | ICD-10-CM | POA: Diagnosis not present

## 2016-09-25 DIAGNOSIS — M25551 Pain in right hip: Secondary | ICD-10-CM | POA: Diagnosis not present

## 2016-09-25 DIAGNOSIS — R52 Pain, unspecified: Secondary | ICD-10-CM | POA: Diagnosis not present

## 2016-11-30 DIAGNOSIS — E291 Testicular hypofunction: Secondary | ICD-10-CM | POA: Diagnosis not present

## 2016-11-30 DIAGNOSIS — I10 Essential (primary) hypertension: Secondary | ICD-10-CM | POA: Diagnosis not present

## 2016-11-30 DIAGNOSIS — F418 Other specified anxiety disorders: Secondary | ICD-10-CM | POA: Diagnosis not present

## 2016-11-30 DIAGNOSIS — G894 Chronic pain syndrome: Secondary | ICD-10-CM | POA: Diagnosis not present

## 2016-11-30 DIAGNOSIS — K219 Gastro-esophageal reflux disease without esophagitis: Secondary | ICD-10-CM | POA: Diagnosis not present

## 2016-11-30 DIAGNOSIS — E785 Hyperlipidemia, unspecified: Secondary | ICD-10-CM | POA: Diagnosis not present

## 2016-11-30 DIAGNOSIS — Z23 Encounter for immunization: Secondary | ICD-10-CM | POA: Diagnosis not present

## 2016-11-30 DIAGNOSIS — D509 Iron deficiency anemia, unspecified: Secondary | ICD-10-CM | POA: Diagnosis not present

## 2016-12-09 DIAGNOSIS — Z96643 Presence of artificial hip joint, bilateral: Secondary | ICD-10-CM | POA: Diagnosis not present

## 2016-12-09 DIAGNOSIS — M47816 Spondylosis without myelopathy or radiculopathy, lumbar region: Secondary | ICD-10-CM | POA: Diagnosis not present

## 2016-12-09 DIAGNOSIS — Z79891 Long term (current) use of opiate analgesic: Secondary | ICD-10-CM | POA: Diagnosis not present

## 2017-01-20 DIAGNOSIS — Z79891 Long term (current) use of opiate analgesic: Secondary | ICD-10-CM | POA: Diagnosis not present

## 2017-02-01 ENCOUNTER — Other Ambulatory Visit: Payer: Self-pay

## 2017-02-01 ENCOUNTER — Emergency Department (HOSPITAL_COMMUNITY)
Admission: EM | Admit: 2017-02-01 | Discharge: 2017-02-01 | Discharge status: Against Medical Advice | Payer: Medicare Other | Attending: Emergency Medicine | Admitting: Emergency Medicine

## 2017-02-01 ENCOUNTER — Emergency Department (HOSPITAL_COMMUNITY): Payer: Medicare Other

## 2017-02-01 ENCOUNTER — Encounter (HOSPITAL_COMMUNITY): Payer: Self-pay | Admitting: *Deleted

## 2017-02-01 DIAGNOSIS — Y999 Unspecified external cause status: Secondary | ICD-10-CM | POA: Insufficient documentation

## 2017-02-01 DIAGNOSIS — I10 Essential (primary) hypertension: Secondary | ICD-10-CM | POA: Insufficient documentation

## 2017-02-01 DIAGNOSIS — Z79899 Other long term (current) drug therapy: Secondary | ICD-10-CM | POA: Diagnosis not present

## 2017-02-01 DIAGNOSIS — Z96641 Presence of right artificial hip joint: Secondary | ICD-10-CM | POA: Insufficient documentation

## 2017-02-01 DIAGNOSIS — S73004A Unspecified dislocation of right hip, initial encounter: Secondary | ICD-10-CM | POA: Insufficient documentation

## 2017-02-01 DIAGNOSIS — Y929 Unspecified place or not applicable: Secondary | ICD-10-CM | POA: Diagnosis not present

## 2017-02-01 DIAGNOSIS — R52 Pain, unspecified: Secondary | ICD-10-CM | POA: Diagnosis not present

## 2017-02-01 DIAGNOSIS — T84020A Dislocation of internal right hip prosthesis, initial encounter: Secondary | ICD-10-CM | POA: Diagnosis not present

## 2017-02-01 DIAGNOSIS — Y9389 Activity, other specified: Secondary | ICD-10-CM | POA: Diagnosis not present

## 2017-02-01 DIAGNOSIS — X509XXA Other and unspecified overexertion or strenuous movements or postures, initial encounter: Secondary | ICD-10-CM | POA: Insufficient documentation

## 2017-02-01 DIAGNOSIS — S79911A Unspecified injury of right hip, initial encounter: Secondary | ICD-10-CM | POA: Diagnosis present

## 2017-02-01 DIAGNOSIS — M25551 Pain in right hip: Secondary | ICD-10-CM | POA: Diagnosis not present

## 2017-02-01 MED ORDER — PROPOFOL 10 MG/ML IV BOLUS
INTRAVENOUS | Status: AC | PRN
  Administered 2017-02-01: 100 mg via INTRAVENOUS

## 2017-02-01 MED ORDER — PROPOFOL 10 MG/ML IV BOLUS
1.0000 mg/kg | Freq: Once | INTRAVENOUS | Status: AC
  Administered 2017-02-01: 100 mg via INTRAVENOUS
  Filled 2017-02-01: qty 20

## 2017-02-01 MED ORDER — PROPOFOL 10 MG/ML IV BOLUS
INTRAVENOUS | Status: AC
  Filled 2017-02-01: qty 20

## 2017-02-01 MED ORDER — MORPHINE SULFATE (PF) 4 MG/ML IV SOLN: 4.0000 mg | Freq: Once | INTRAVENOUS | Status: DC

## 2017-02-01 MED ORDER — SODIUM CHLORIDE 0.9 % IV BOLUS (SEPSIS)
1000.0000 mL | Freq: Once | INTRAVENOUS | Status: AC
  Administered 2017-02-01: 1000 mL via INTRAVENOUS

## 2017-02-01 MED ORDER — PROPOFOL 10 MG/ML IV BOLUS
INTRAVENOUS | Status: AC | PRN
  Administered 2017-02-01: 40 mg via INTRAVENOUS

## 2017-02-01 MED ORDER — HYDROMORPHONE HCL 1 MG/ML IJ SOLN: 0.5000 mg | Freq: Once | INTRAMUSCULAR | Status: DC

## 2017-02-01 NOTE — ED Notes (Signed)
Ortho otw 

## 2017-02-01 NOTE — Progress Notes (Signed)
Orthopedic Tech Progress Note Patient Details:  Todd HightJohn D [name] 03/24/1970 [id number]  Ortho Devices Type of Ortho Device: Knee Immobilizer Ortho Device/Splint Interventions: Application   Saul FordyceJennifer C Cherisse Carrell 02/01/2017, 10:29 AM

## 2017-02-01 NOTE — ED Provider Notes (Signed)
.Sedation Date/Time: 02/01/2017 8:42 AM Performed by: Rolan BuccoBelfi, Jodeci Roarty, MD Authorized by: Rolan BuccoBelfi, Dublin Grayer, MD   Consent:    Consent obtained:  Written   Consent given by:  Patient   Risks discussed:  Allergic reaction, dysrhythmia, inadequate sedation, vomiting, prolonged hypoxia resulting in organ damage and respiratory compromise necessitating ventilatory assistance and intubation   Alternatives discussed:  Analgesia without sedation and anxiolysis Universal protocol:    Procedure explained and questions answered to patient or proxy's satisfaction: yes     Relevant documents present and verified: yes     Test results available and properly labeled: yes     Imaging studies available: yes     Immediately prior to procedure a time out was called: yes   Indications:    Procedure performed:  Dislocation reduction   Procedure necessitating sedation performed by:  Different physician   Intended level of sedation:  Moderate (conscious sedation) Pre-sedation assessment:    Time since last food or drink:  Last night   ASA classification: class 2 - patient with mild systemic disease     Neck mobility: normal     Mouth opening:  3 or more finger widths   Thyromental distance:  3 finger widths   Mallampati score:  II - soft palate, uvula, fauces visible   Pre-sedation assessments completed and reviewed: airway patency, cardiovascular function, hydration status, mental status, nausea/vomiting, pain level, respiratory function and temperature     Pre-sedation assessment completed:  02/01/2017 8:44 AM Immediate pre-procedure details:    Reassessment: Patient reassessed immediately prior to procedure     Reviewed: vital signs and NPO status     Verified: bag valve mask available, emergency equipment available, intubation equipment available, IV patency confirmed, oxygen available and suction available   Procedure details (see MAR for exact dosages):    Preoxygenation:  Nasal cannula   Sedation:   Propofol   Analgesia:  Fentanyl   Intra-procedure monitoring:  Blood pressure monitoring, cardiac monitor, continuous capnometry, continuous pulse oximetry, frequent LOC assessments and frequent vital sign checks   Total Provider sedation time (minutes):  16 Post-procedure details:    Post-sedation assessment completed:  02/01/2017 11:00 AM   Attendance: Constant attendance by certified staff until patient recovered     Recovery: Patient returned to pre-procedure baseline     Post-sedation assessments completed and reviewed: airway patency, cardiovascular function, hydration status, mental status, nausea/vomiting, pain level, respiratory function and temperature     Patient is stable for discharge or admission: yes     Patient tolerance:  Tolerated well, no immediate complications Comments:     Pt was fully alert, ambulating after procedure, became very angry, would not wait for repeat imaging.  No vomiting, but would not stay for PO challenge.  No hypoxia.     .Sedation Date/Time: 02/01/2017 8:42 AM Performed by: Rolan BuccoBelfi, Traivon Morrical, MD Authorized by: Rolan BuccoBelfi, Horace Lukas, MD   Consent:    Consent obtained:  Written   Consent given by:  Patient   Risks discussed:  Allergic reaction, dysrhythmia, inadequate sedation, vomiting, prolonged hypoxia resulting in organ damage and respiratory compromise necessitating ventilatory assistance and intubation   Alternatives discussed:  Analgesia without sedation and anxiolysis Universal protocol:    Procedure explained and questions answered to patient or proxy's satisfaction: yes     Relevant documents present and verified: yes     Test results available and properly labeled: yes     Imaging studies available: yes     Immediately prior to procedure a  time out was called: yes   Indications:    Procedure performed:  Dislocation reduction   Procedure necessitating sedation performed by:  Different physician   Intended level of sedation:  Moderate  (conscious sedation) Pre-sedation assessment:    Time since last food or drink:  Last night   ASA classification: class 2 - patient with mild systemic disease     Neck mobility: normal     Mouth opening:  3 or more finger widths   Thyromental distance:  3 finger widths   Mallampati score:  II - soft palate, uvula, fauces visible   Pre-sedation assessments completed and reviewed: airway patency, cardiovascular function, hydration status, mental status, nausea/vomiting, pain level, respiratory function and temperature     Pre-sedation assessment completed:  02/01/2017 8:44 AM Immediate pre-procedure details:    Reassessment: Patient reassessed immediately prior to procedure     Reviewed: vital signs and NPO status     Verified: bag valve mask available, emergency equipment available, intubation equipment available, IV patency confirmed, oxygen available and suction available   Procedure details (see MAR for exact dosages):    Preoxygenation:  Nasal cannula   Sedation:  Propofol   Analgesia:  Fentanyl   Intra-procedure monitoring:  Blood pressure monitoring, cardiac monitor, continuous capnometry, continuous pulse oximetry, frequent LOC assessments and frequent vital sign checks   Total Provider sedation time (minutes):  16 Post-procedure details:    Post-sedation assessment completed:  02/01/2017 11:00 AM   Attendance: Constant attendance by certified staff until patient recovered     Recovery: Patient returned to pre-procedure baseline     Post-sedation assessments completed and reviewed: airway patency, cardiovascular function, hydration status, mental status, nausea/vomiting, pain level, respiratory function and temperature     Patient is stable for discharge or admission: yes     Patient tolerance:  Tolerated well, no immediate complications Comments:     Pt was fully alert, ambulating after procedure, became very angry, would not wait for repeat imaging.  No vomiting, but would not  stay for PO challenge.  No hypoxia.       Rolan BuccoBelfi, Eustacio Ellen, MD 02/01/17 1135

## 2017-02-01 NOTE — Discharge Instructions (Signed)
Please take Ibuprofen (Advil, motrin) and Tylenol (acetaminophen) to relieve your pain.  You may take up to 600 MG (3 pills) of normal strength ibuprofen every 8 hours as needed.  In between doses of ibuprofen you make take tylenol, up to 1,000 mg (two extra strength pills).  Do not take more than 3,000 mg tylenol in a 24 hour period.  Please check all medication labels as many medications such as pain and cold medications may contain tylenol.  Do not drink alcohol while taking these medications.  Do not take other NSAID'S while taking ibuprofen (such as aleve or naproxen).  Please take ibuprofen with food to decrease stomach upset. ° °Today you received medications that may make you sleepy or impair your ability to make decisions.  For the next 24 hours please do not drive, operate heavy machinery, care for a small child with out another adult present, or perform any activities that may cause harm to you or someone else if you were to fall asleep or be impaired.  ° ° °

## 2017-02-01 NOTE — ED Notes (Signed)
The pt had of fentanyl iv per ems

## 2017-02-01 NOTE — ED Notes (Addendum)
Portable xray at bedside; pt got out of bed, standing; has ripped off knee immobilizer; ripped out IV; cussing that he was not properly medicated earlier and lied to about getting pain meds. Wife at bedside not saying anything and mouthing that she is sorry to staff. Security called and at bedside.

## 2017-02-01 NOTE — ED Provider Notes (Signed)
[name] Provider [facility]   CSN: [id number] Arrival date & time: 02/01/17  [id number]     History   Chief Complaint Chief Complaint  Patient presents with  . Hip Pain    HPI Todd HightJohn D [name] is a 46 y.o. male with a history of drug addiction and multiple overdoses, multiple hip dislocations presents today for a hip dislocation.  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  He reports that he was sitting on the toilet and turned to wipe him self and felt his right hip pop.  He says this feels like his normal hip dislocation.  He denies numbness or tingling of the right leg.  His last oral intake was about one hour ago when he took a few sips of water.    HPI  Past Medical History:  Diagnosis Date  . Drug addiction (HCC)   . GERD (gastroesophageal reflux disease)   . H/O hiatal hernia   . Hypertension     Patient Active Problem List   Diagnosis Date Noted  . Hypoxia 10/07/2015  . Mechanical complication of internal orthopedic device (HCC) 03/16/2015  . Hip dislocation, right (HCC) 02/18/2015  . Anemia 07/16/2014  . Altered mental status 10/20/2013  . Dislocation of hip prosthesis (HCC) 10/20/2013  . CAP (community acquired pneumonia) 10/20/2013  . Acute GI bleeding 09/19/2013  . Acute blood loss anemia 09/19/2013  . Essential hypertension 09/19/2013  . Sinus tachycardia 09/19/2013  . GASTROESOPHAGEAL REFLUX, NO ESOPHAGITIS 05/06/2006    Past Surgical History:  Procedure Laterality Date  . APPENDECTOMY     as child  . COLONOSCOPY WITH PROPOFOL  02/17/2012   Procedure: COLONOSCOPY WITH PROPOFOL;  Surgeon: Willis ModenaWilliam Outlaw, MD;  Location: [facility] ENDOSCOPY;  Service: Endoscopy;  Laterality: N/A;  . ESOPHAGOGASTRODUODENOSCOPY Left 09/19/2013   Procedure: ESOPHAGOGASTRODUODENOSCOPY (EGD);  Surgeon: Willis ModenaWilliam Outlaw, MD;  [facility];  Service: Endoscopy;  Laterality:  Left;  . ESOPHAGOGASTRODUODENOSCOPY (EGD) WITH PROPOFOL  02/17/2012   Procedure: ESOPHAGOGASTRODUODENOSCOPY (EGD) WITH PROPOFOL;  Surgeon: Willis ModenaWilliam Outlaw, MD;  Location: [facility] ENDOSCOPY;  Service: Endoscopy;  Laterality: N/A;  . HIP CLOSED REDUCTION Right 10/20/2013   Procedure: CLOSED REDUCTION HIP;  Surgeon: Eldred MangesMark C Yates, MD;  Location: [facility] OR;  Service: Orthopedics;  Laterality: Right;  . HIP CLOSED REDUCTION Right 02/18/2015   Procedure: CLOSED REDUCTION HIP;  Surgeon: Jodi GeraldsJohn Graves, MD;  Location: [facility] ORS;  Service: Orthopedics;  Laterality: Right;  . HIP CLOSED REDUCTION Right 03/16/2015   Procedure: CLOSED REDUCTION HIP;  Surgeon: Kathryne Hitchhristopher Y Blackman, MD;  [facility];  Service: Orthopedics;  Laterality: Right;  . JOINT REPLACEMENT  2005   Right Hip       Home Medications    Prior to Admission medications   Medication Sig Start Date End Date Taking? Authorizing Provider  atorvastatin (LIPITOR) 40 MG tablet Take 40 mg by mouth daily.    [provider]  clotrimazole (LOTRIMIN) 1 % cream Apply 1 application topically 3 (three) times daily.    [provider]  FLUoxetine (PROZAC) 40 MG capsule Take 40 mg by mouth daily.    [provider]  hydrOXYzine (VISTARIL) 25 MG capsule Take 25 mg by mouth daily as needed for anxiety.  06/27/16   [provider]  ibuprofen (ADVIL,MOTRIN) 800 MG tablet Take 1 tablet (800 mg total) by mouth 3 (three) times daily. 07/11/16   Eber HongMiller, Brian, MD  loratadine (CLARITIN) 10 MG tablet Take 10 mg by mouth daily as needed for allergies.     [provider]  Melatonin 10 MG TABS Take 10 mg by mouth at bedtime as needed (sleep).     [provider]  metoprolol (LOPRESSOR) 50 MG tablet Take 50 mg by mouth 2 (two) times daily.    [provider]  Multiple Vitamin (MULTIVITAMIN) tablet Take 1 tablet by mouth daily.    [provider]  oxyCODONE (ROXICODONE) 15 MG immediate release tablet Take 15  mg by mouth 3 (three) times daily as needed for pain.  06/17/16   [provider]  OXYCONTIN 20 MG 12 hr tablet Take 20 mg by mouth 2 (two) times daily. 06/19/16   [provider]  pantoprazole (PROTONIX) 40 MG tablet Take 40 mg by mouth 2 (two) times daily.    [provider]  tiZANidine (ZANAFLEX) 4 MG tablet Take 4 mg by mouth 3 (three) times daily.  11/25/14   [provider]    Family History Family History  Problem Relation Age of Onset  . Diabetes Mother     Social History Social History   Tobacco Use  . Smoking status: Never Smoker  . Smokeless tobacco: Never Used  Substance Use Topics  . Alcohol use: No    Alcohol/week: 2.4 oz    Types: 4 Cans of beer per week    Comment: social   . Drug use: No     Allergies   Ambien [zolpidem tartrate]; Nu-iron [polysaccharide iron complex]; Fentanyl; and Clonidine derivatives   Review of Systems Review of Systems  Constitutional: Negative for chills and fever.  HENT: Negative for ear pain and sore throat.   Eyes: Negative for pain and visual disturbance.  Respiratory: Negative for cough and shortness of breath.   Cardiovascular: Negative for chest pain and palpitations.  Gastrointestinal: Negative for abdominal pain and vomiting.  Genitourinary: Negative for dysuria and hematuria.  Musculoskeletal: Positive for arthralgias. Negative for back pain.       Right hip pain, felt it pop out.   Skin: Negative for color change and rash.  Neurological: Negative for seizures and syncope.  All other systems reviewed and are negative.    Physical Exam Updated Vital Signs BP 111/63   Pulse 61   Temp 98.3 F (36.8 C) (Oral)   Resp 14   Ht 6\' 1"  (1.854 m)   Wt 106.6 kg (235 lb)   SpO2 96%   BMI 31.00 kg/m   Physical Exam  Constitutional: He appears well-developed and well-nourished.  HENT:  Head: Normocephalic and atraumatic.  Mouth/Throat: Oropharynx is clear and moist.  Eyes:  Conjunctivae are normal.  Neck: Normal range of motion. Neck supple.  Cardiovascular: Normal rate, regular rhythm, normal heart sounds and intact distal pulses.  No murmur heard. 2+ DP/PT pulses bilaterally.   Pulmonary/Chest: Effort normal and breath sounds normal. No respiratory distress.  Abdominal: Soft. Bowel sounds are normal. He exhibits no distension. There is no tenderness.  Musculoskeletal: He exhibits no edema.  Right leg is held straight, normal ROM of both ankles.  He has TTP over right hip.  No TTP over left leg, right knee or ankle.    Neurological: He is alert.  Skin: Skin is warm and dry.  Psychiatric: He has a normal mood and affect. His behavior is normal.  Nursing note and vitals reviewed.    ED Treatments / Results  Labs (all labs ordered are listed,  but only abnormal results are displayed) Labs Reviewed - No data to display  EKG  EKG Interpretation None       Radiology Dg Hip Port Unilat W Or Wo Pelvis 1 View Right  Result Date: 02/01/2017 CLINICAL DATA:  46 year old male with right hip dislocation EXAM: DG HIP (WITH OR WITHOUT PELVIS) 1V PORT RIGHT COMPARISON:  Prior radiographs 09/19/2016 FINDINGS: Positive for superior dislocation of the right arthroplasty prosthesis. 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 No evidence of fracture. The visualized bones appear intact. Sclerosis in the left femoral head is again noted suggesting avascular necrosis. IMPRESSION: 1. Positive for superior dislocation of the right hip arthroplasty prosthesis. No definite fracture. 2. Left femoral head avascular necrosis. Electronically Signed   By: Malachy MoanHeath  McCullough M.D.   On: 02/01/2017 08:14    Procedures .Sedation Date/Time: 02/01/2017 3:45 PM Performed by: Cristina GongHammond, Ronald Vinsant W, PA-C Authorized by: Cristina GongHammond, Artemus Romanoff W, PA-C   Consent:    Consent obtained:  Verbal   Consent given by:  Patient   Risks  discussed:  Allergic reaction, dysrhythmia, inadequate sedation, nausea, prolonged hypoxia resulting in organ damage, prolonged sedation necessitating reversal, respiratory compromise necessitating ventilatory assistance and intubation and vomiting   Alternatives discussed:  Analgesia without sedation, anxiolysis and regional anesthesia Universal protocol:    Procedure explained and questions answered to patient or proxy's satisfaction: yes     Relevant documents present and verified: yes     Test results available and properly labeled: yes     Imaging studies available: yes     Required blood products, implants, devices, and special equipment available: yes     Immediately prior to procedure a time out was called: yes     Patient identity confirmation method:  Verbally with patient and arm band Indications:    Procedure necessitating sedation performed by:  Physician performing sedation   Intended level of sedation:  Deep Pre-sedation assessment:    Time since last food or drink:  Over 3 hours   ASA classification: class 2 - patient with mild systemic disease     Neck mobility: normal     Mouth opening:  3 or more finger widths   Thyromental distance:  4 finger widths   Mallampati score:  II - soft palate, uvula, fauces visible   Pre-sedation assessments completed and reviewed: airway patency, cardiovascular function, hydration status, mental status, nausea/vomiting, pain level, respiratory function and temperature   Immediate pre-procedure details:    Reassessment: Patient reassessed immediately prior to procedure     Reviewed: vital signs, relevant labs/tests and NPO status     Verified: bag valve mask available, emergency equipment available, intubation equipment available, IV patency confirmed, oxygen available and suction available   Procedure details (see MAR for exact dosages):    Preoxygenation:  Nasal cannula   Sedation:  Propofol   Intra-procedure monitoring:  Blood pressure  monitoring, cardiac monitor, continuous pulse oximetry, frequent LOC assessments, frequent vital sign checks and continuous capnometry   Intra-procedure events: none     Total Provider sedation time (minutes):  26 Post-procedure details:    Attendance: Constant attendance by certified staff until patient recovered     Recovery: Patient returned to pre-procedure baseline     Post-sedation assessments completed and reviewed: airway patency, cardiovascular function, hydration status, mental status, nausea/vomiting, pain level, respiratory function and temperature     Patient is stable for discharge or admission: yes     Patient tolerance:  Tolerated  well, no immediate complications Reduction of dislocation Date/Time: 02/01/2017 3:48 PM Performed by: Cristina Gong, PA-C Authorized by: Cristina Gong, PA-C  Consent: Verbal consent obtained. Written consent obtained. Consent given by: patient Patient understanding: patient states understanding of the procedure being performed Patient consent: the patient's understanding of the procedure matches consent given Procedure consent: procedure consent matches procedure scheduled Relevant documents: relevant documents present and verified Test results: test results available and properly labeled Site marked: the operative site was marked Imaging studies: imaging studies available Patient identity confirmed: verbally with patient and arm band Time out: Immediately prior to procedure a "time out" was called to verify the correct patient, procedure, equipment, support staff and site/side marked as required.  Sedation: Patient sedated: yes Sedation type: moderate (conscious) sedation Sedatives: propofol and see MAR for details Analgesia: see MAR for details Sedation start date/time: 02/01/2017 10:09 AM Sedation end date/time: 02/01/2017 10:35 AM Vitals: Vital signs were monitored during sedation.  Patient tolerance: Patient tolerated the  procedure well with no immediate complications    (including critical care time)  Medications Ordered in ED Medications  sodium chloride 0.9 % bolus 1,000 mL (0 mLs Intravenous Stopped 02/01/17 1045)  propofol (DIPRIVAN) 10 mg/mL bolus/IV push 106.6 mg (100 mg Intravenous Given 02/01/17 1008)  propofol (DIPRIVAN) 10 mg/mL bolus/IV push (100 mg Intravenous Given 02/01/17 1009)  propofol (DIPRIVAN) 10 mg/mL bolus/IV push (40 mg Intravenous Given 02/01/17 1012)  propofol (DIPRIVAN) 10 mg/mL bolus/IV push (100 mg Intravenous Given 02/01/17 1020)  propofol (DIPRIVAN) 10 mg/mL bolus/IV push (40 mg Intravenous Given 02/01/17 1023)     Initial Impression / Assessment and Plan / ED Course  I have reviewed the triage vital signs and the nursing notes.  Pertinent labs & imaging results that were available during my care of the patient were reviewed by me and considered in my medical decision making (see chart for details).  Clinical Course as of Feb 02 1552  Mon Feb 01, 2017  1047 Informed that patient had ripped his IV out, tore the knee imobilizer off.  I went to speak with patient, he was awake and alert, using profane language, oriented to person, place and time.  His wife was in the room attempting to calm him.  He was throwing things.  Security was called.  Patient and wife were informed of the risks of refusing post reduction films and post sedation observation. Patient is unwilling to stay for post sedation observation.  I attempted verbal de-escalation, and compromises and bargaining, however patient remained agitated, confrontational and unwilling to stay.  I informed him of the potential risks of signing out AGAINST MEDICAL ADVICE at this time.  I feel the patient is awake and alert and has recovered from his sedation to be able to make this decision.  He left the emergency room on his own free will, walking without limp or difficulty.  He was informed that he may always return for treatment.   He and his wife were given return precautions, which patient acknowledged with profanity.    [EH]    Clinical Course User Index [EH] Cristina Gong, PA-C   Todd Hight presents today for concern of right hip dislocation.  He has a right prosthetic hip with recurrent dislocations.  He reports that this is the sixth dislocation since January.  Denies any trauma or falls, stating that he was twisting and felt his right hip pop.  X-rays were obtained which showed anterior hip dislocation.  Procedural  sedation with reduction was performed.  Once patient had returned to normal baseline mental status after sedation he continued being agitated.  He was not hypoxic.  He was agitated prior to sedation, complaining about the lack of pain medicine despite him getting fentanyl from EMS.  He did not have any hypoxic events during sedation and other causes of his agitation were considered, however I feel his agitation is consistent with his pre-sedation baseline status of agitation. He was able to walk and bear weight on the hip, discarded the knee immobilizer, please see clinical course user index.  At the time of patient leaving AMA I feel that he has recovered from the propofol and is able to make the decision to leave AMA.  He and his wife were given return precautions and the risks of leaving AMA were explained to them and they stated their understanding.   Final Clinical Impressions(s) / ED Diagnoses   Final diagnoses:  Dislocation of right hip, initial encounter [facility])    ED Discharge Orders    None       Cristina Gong, PA-C 02/01/17 1600    Rolan Bucco, MD 02/01/17 1609

## 2017-02-01 NOTE — ED Notes (Signed)
Portable xray at bedside.

## 2017-02-01 NOTE — ED Triage Notes (Signed)
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 He just turned his rt leg and the hip popped out good pedal pulses in both feet sl swelling  He arrived by gems from home

## 2017-02-01 NOTE — Progress Notes (Signed)
Pt placed on [place] 2 L with ETCO2 monitoring for a conscious sedation procedure.  Airway cart at door, suction and ambu bag at bedside.

## 2017-02-01 NOTE — Sedation Documentation (Addendum)
Hip appears to be back in. Rad called. Knee immobilizer placed

## 2017-02-01 NOTE — ED Notes (Signed)
Pt eloped.

## 2017-02-01 NOTE — Progress Notes (Signed)
Conscious sedation procedure completed.

## 2017-02-01 NOTE — ED Notes (Signed)
Patient is awake and alert at this time; hip still out. More meds to be given and reattempt

## 2017-02-01 NOTE — ED Notes (Signed)
Portable xray to come.

## 2017-08-06 IMAGING — CT CT HEAD W/O CM
2 series · 16 of 30 positions shown, 20 images · non-contrast
Comparison: 06/25/2014

CLINICAL DATA: Recurrent right hip dislocations. Altered mental
status. Patient has taken pain medications.

EXAM:
CT HEAD WITHOUT CONTRAST
TECHNIQUE: Contiguous axial images were obtained from the base of the skull
through the vertex without intravenous contrast.

[Series 2: head w/o · axial · non-contrast · 0.45mm/px · z∈[-142,-12]mm · 13 of 32 slices shown, 17 images]
[im 3/32  brain]
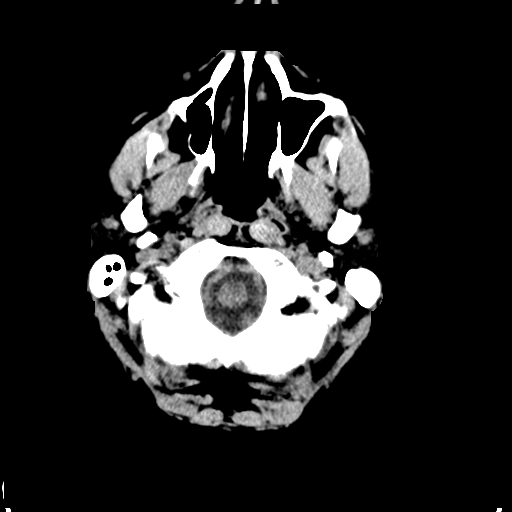
[im 3/32  bone]
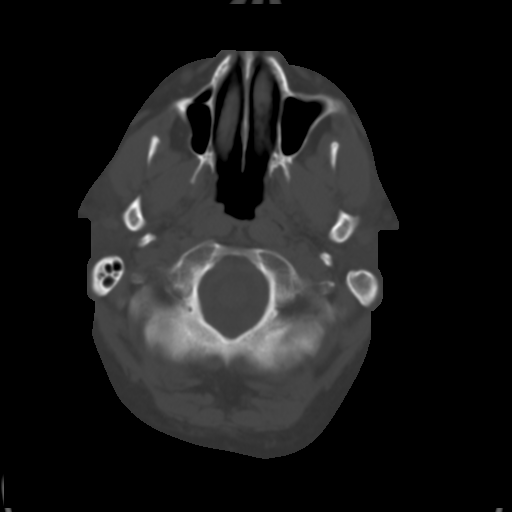
[im 5/32  brain]
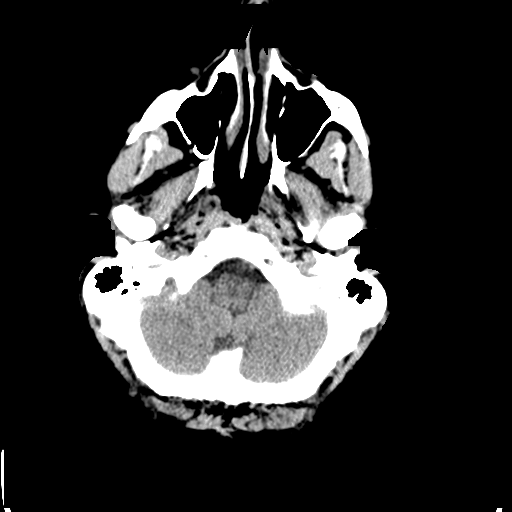
[im 7/32  brain]
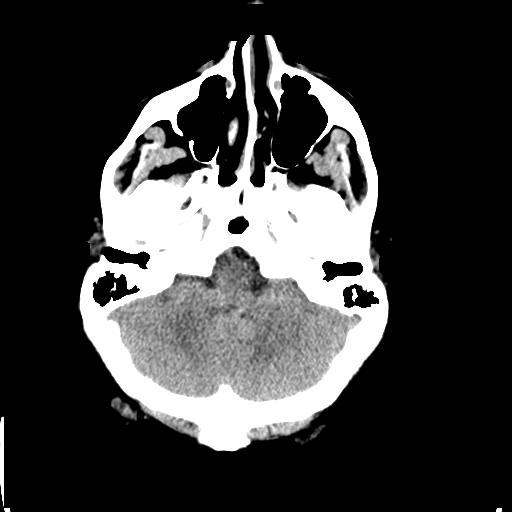
[im 9/32  brain]
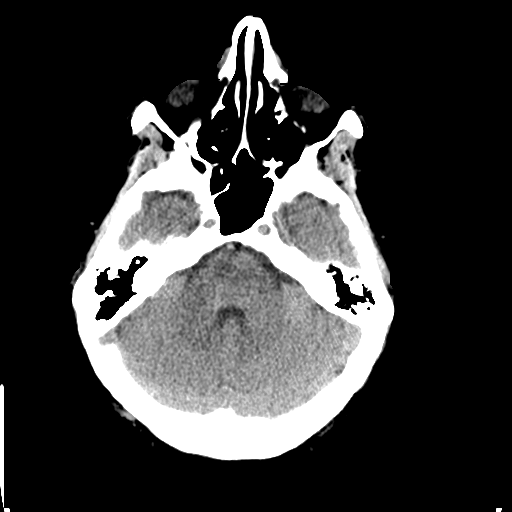
[im 12/32  brain]
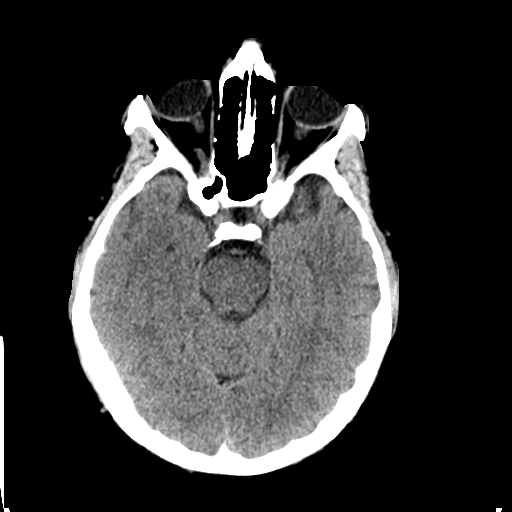
[im 12/32  bone]
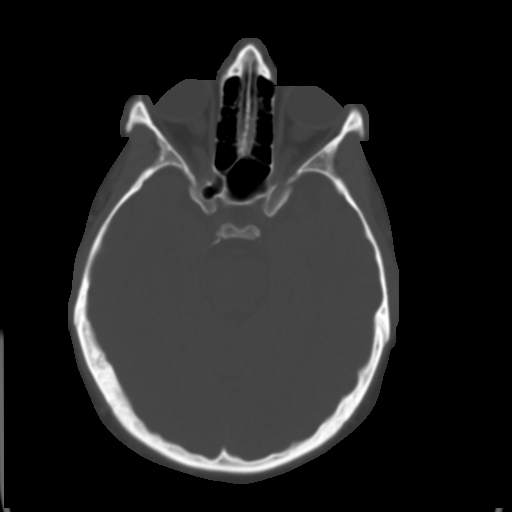
[im 14/32  brain]
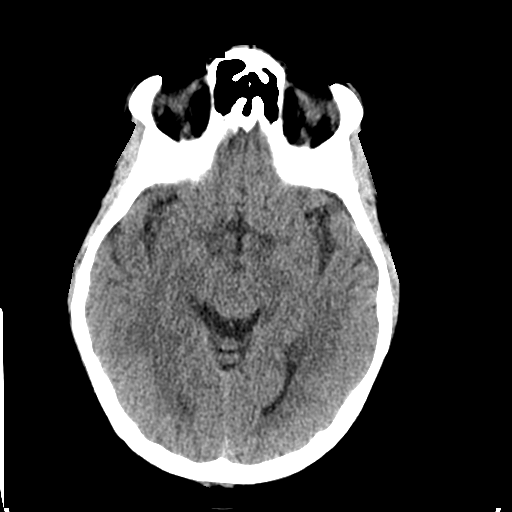
[im 16/32  brain]
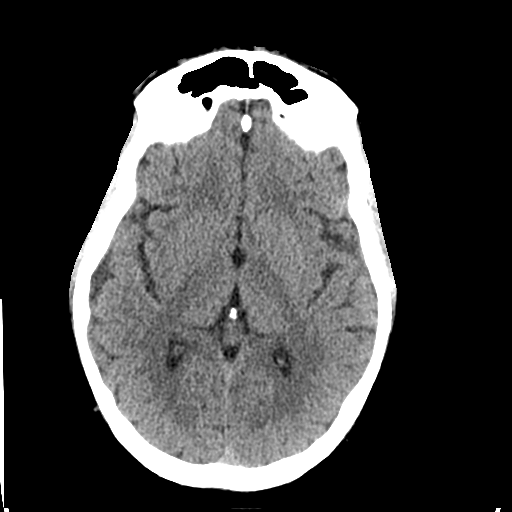
[im 18/32  brain]
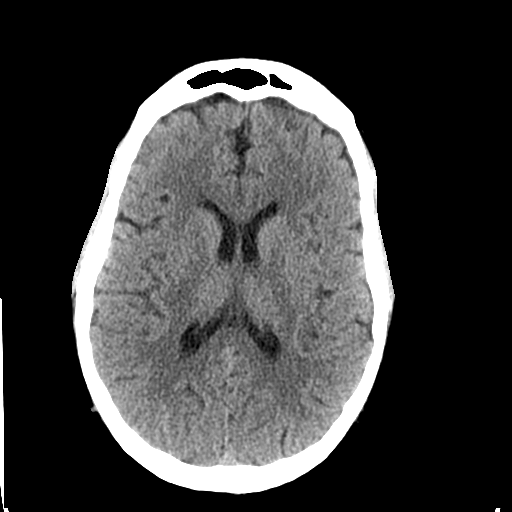
[im 20/32  brain]
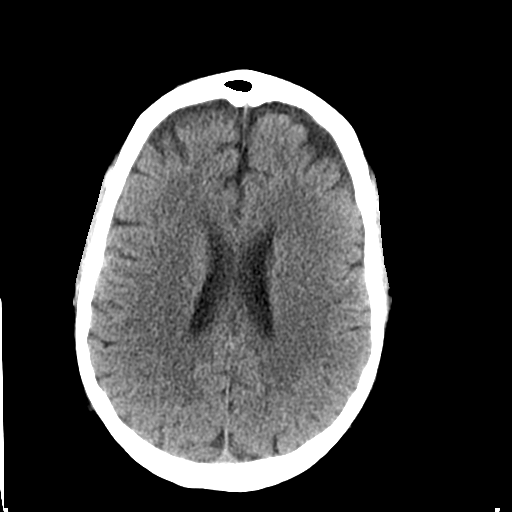
[im 20/32  bone]
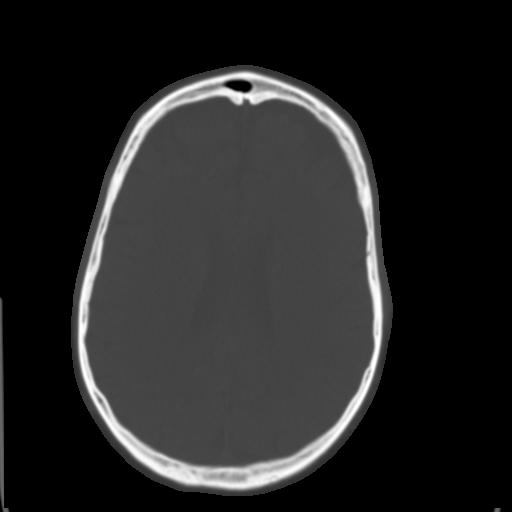
[im 23/32  brain]
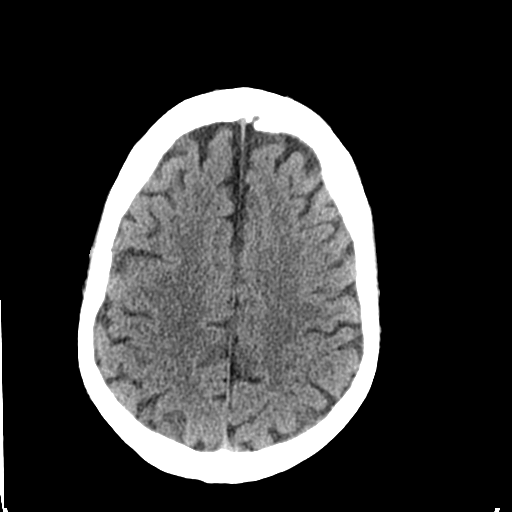
[im 25/32  brain]
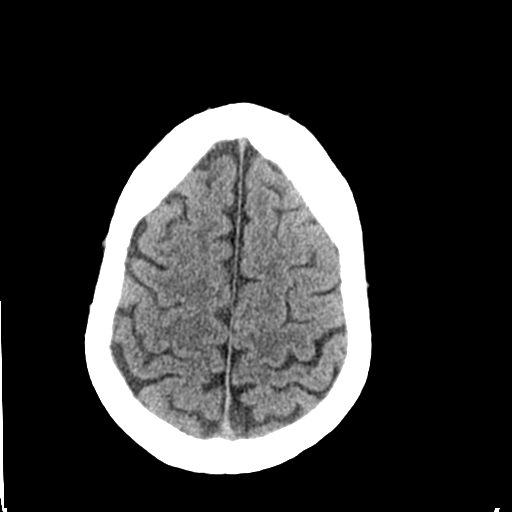
[im 27/32  brain]
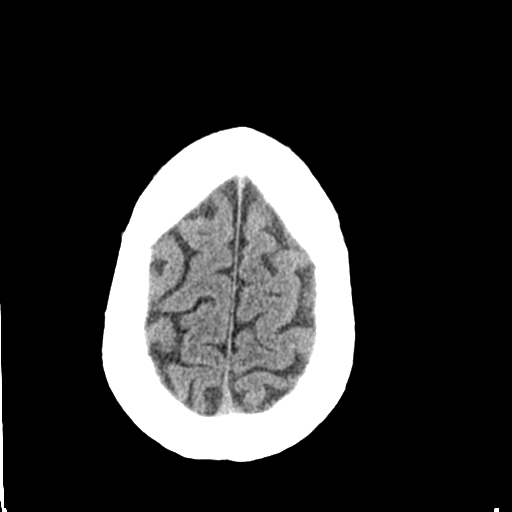
[im 29/32  brain]
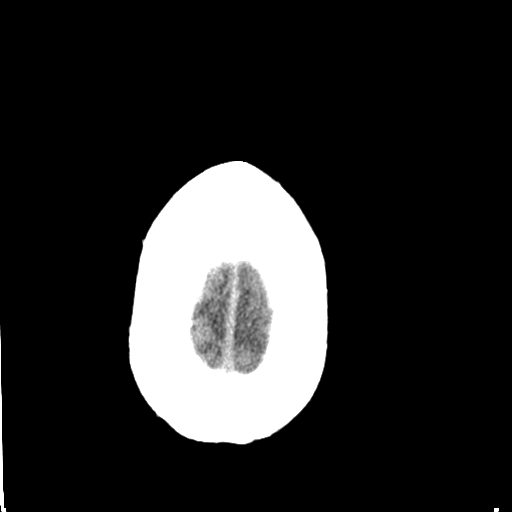
[im 29/32  bone]
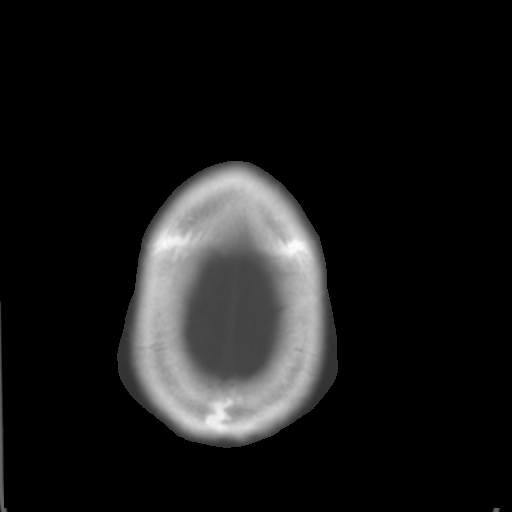

[Series 3: bone windows · axial · 0.45mm/px · z∈[-142,-97]mm · 3 of 32 slices shown]
[im 3/32  bone]
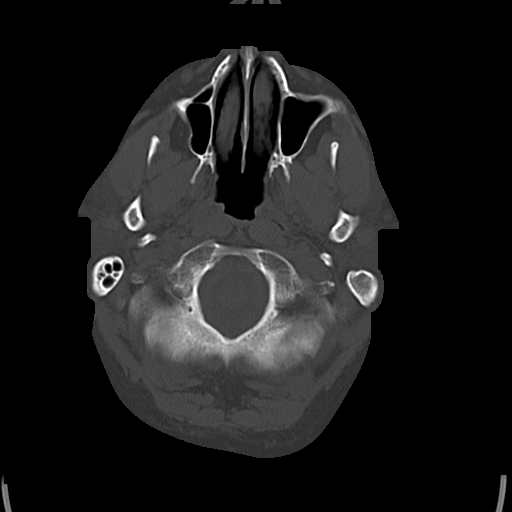
[im 7/32  bone]
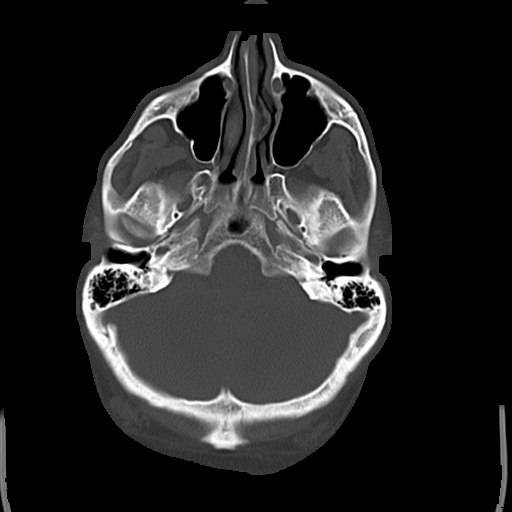
[im 12/32  bone]
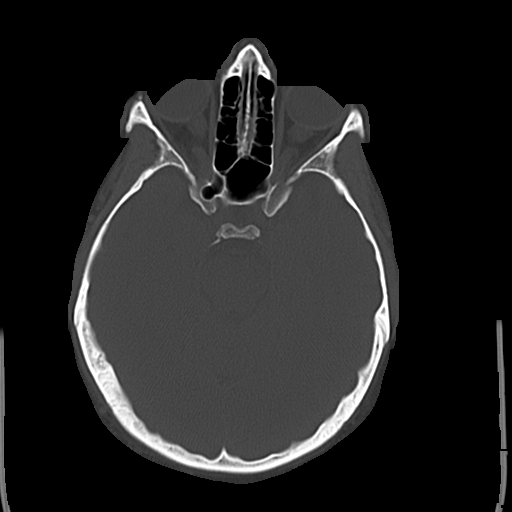

[16 of 30 positions shown; findings below may reference images not displayed]

FINDINGS: Ventricles and sulci appear symmetrical. No ventricular dilatation.
No mass effect or midline shift. No abnormal extra-axial fluid
collections. Gray-white matter junctions are distinct. Basal
cisterns are not effaced. No evidence of acute intracranial
hemorrhage. No depressed skull fractures. Visualized paranasal
sinuses and mastoid air cells are not opacified.
IMPRESSION: No acute intracranial abnormalities.

## 2017-08-06 IMAGING — CR DG CHEST 2V
2 series · 2 of 2 positions shown · non-contrast
Comparison: Chest radiograph from 06/25/2014

CLINICAL DATA: Acute onset of altered mental status. Initial
encounter.

EXAM:
CHEST  2 VIEW

[w chest pa]
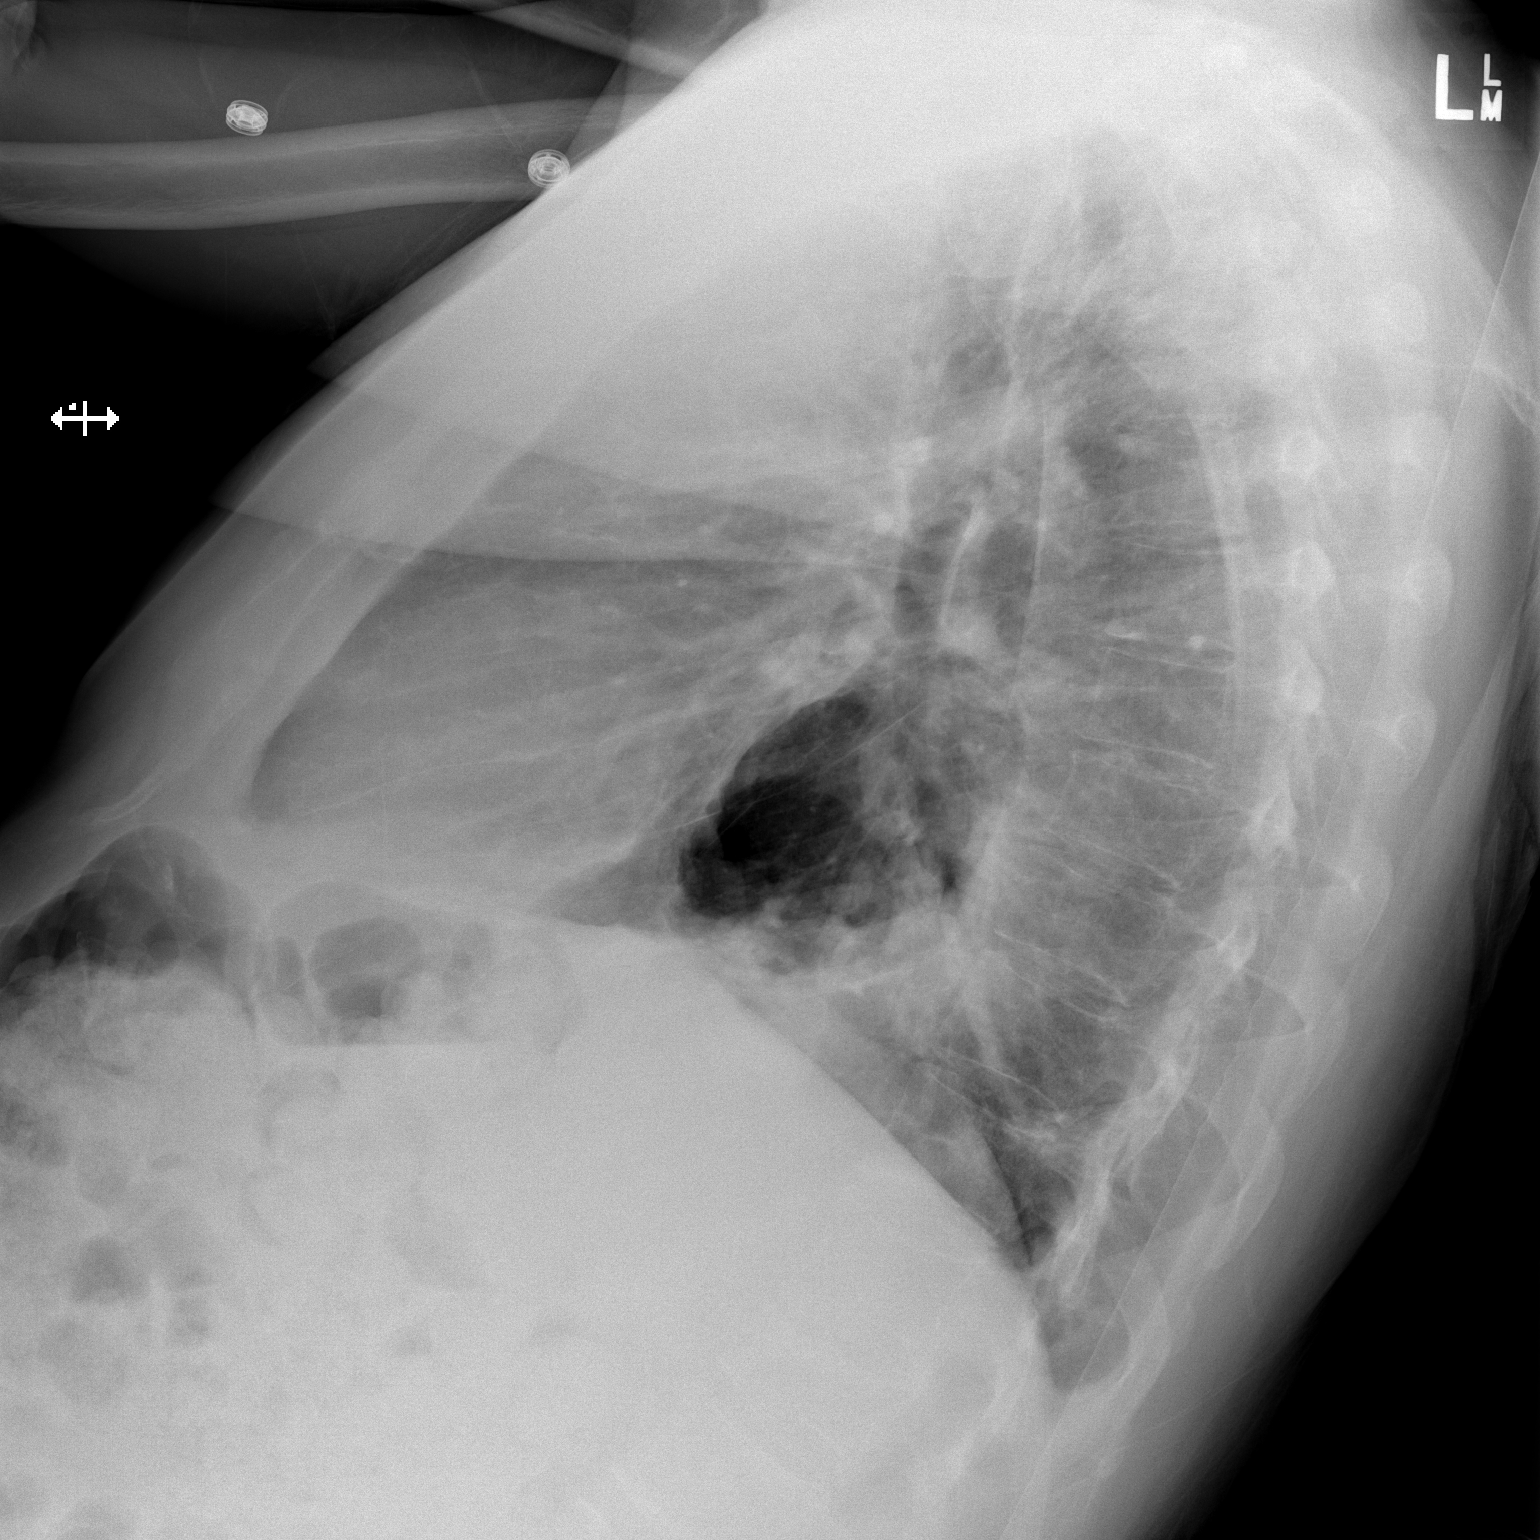

[x chest ap]
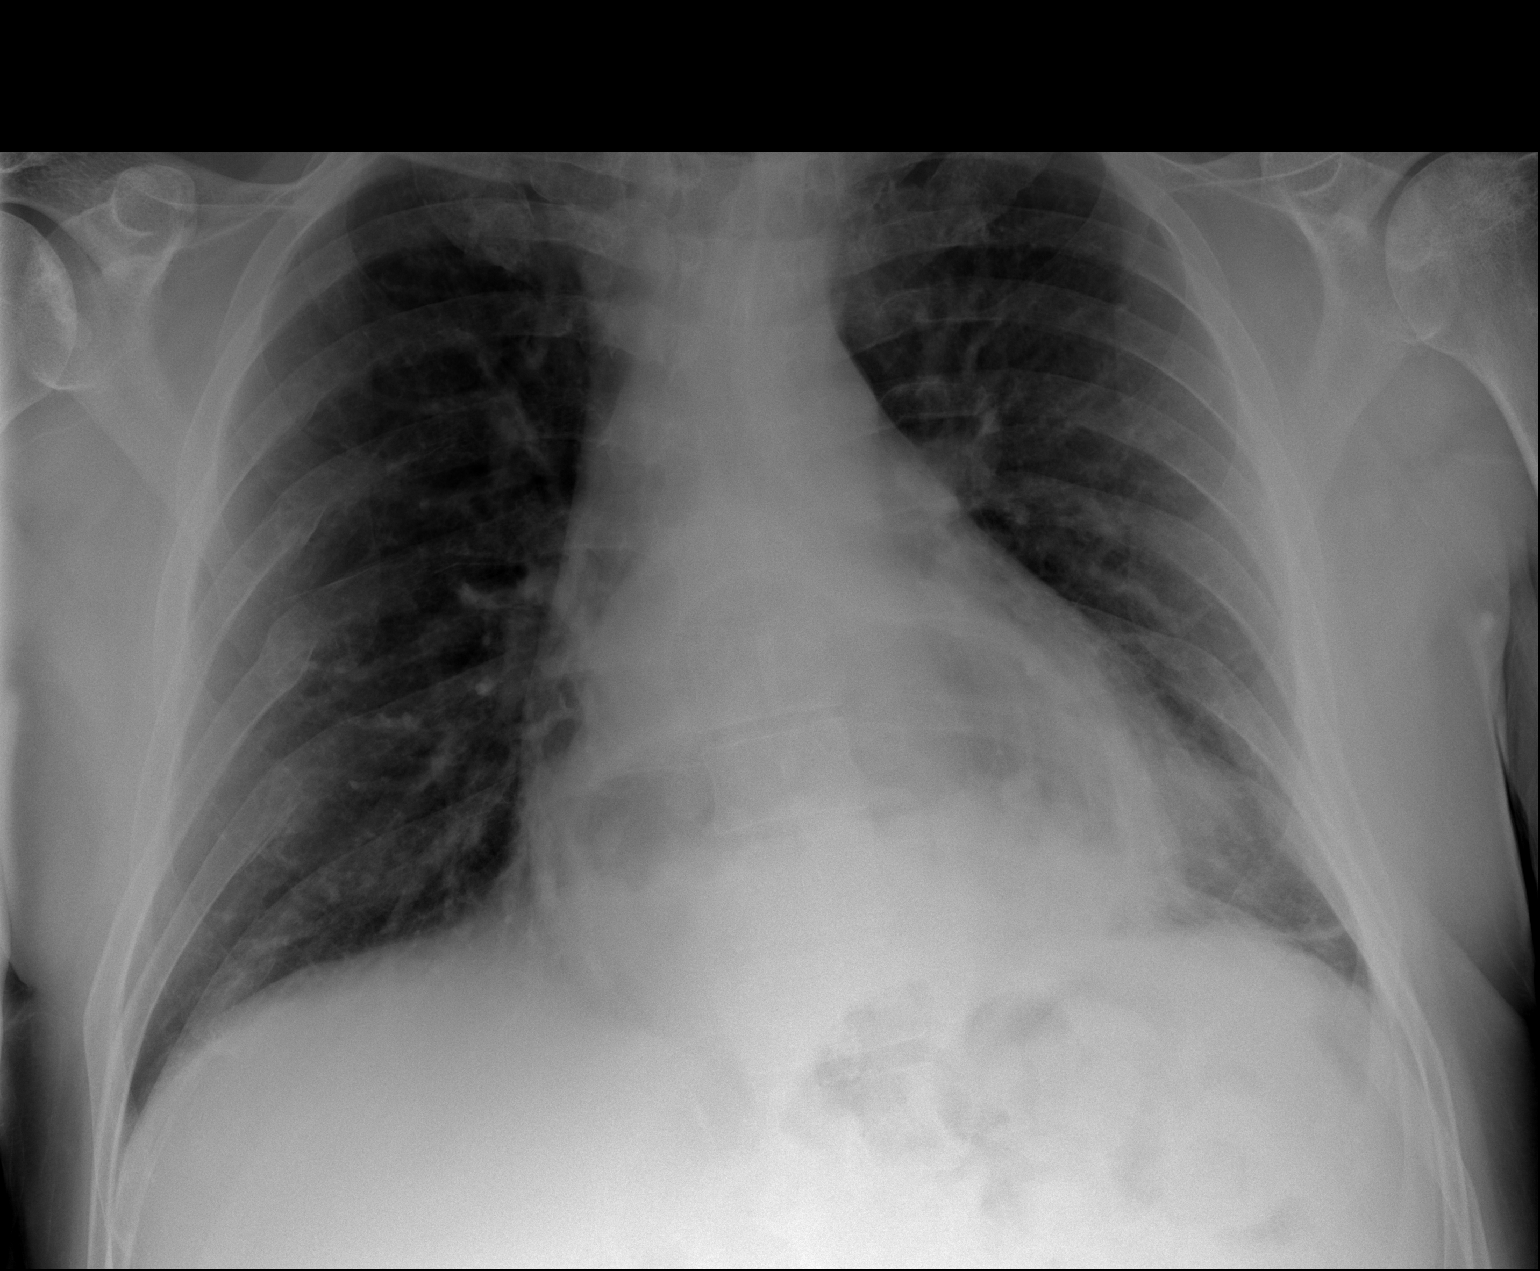

[2 of 2 positions shown; findings below may reference images not displayed]

FINDINGS: The lungs are well-aerated. Minimal bilateral atelectasis is noted.
There is no evidence of pleural effusion or pneumothorax.

The heart is mildly enlarged. A large hiatal hernia is noted,
containing fluid and air. No acute osseous abnormalities are seen.
Chronic right-sided rib deformities are again noted.
IMPRESSION: 1. Minimal bilateral atelectasis noted.  Lungs otherwise clear.
2. Mild cardiomegaly.
3. Large hiatal hernia, containing fluid and air.

## 2019-01-09 ENCOUNTER — Encounter (HOSPITAL_COMMUNITY): Payer: Self-pay | Admitting: Family Medicine

## 2019-01-09 ENCOUNTER — Emergency Department (HOSPITAL_COMMUNITY): Payer: Medicare Other

## 2019-01-09 ENCOUNTER — Emergency Department (HOSPITAL_COMMUNITY)
Admission: EM | Admit: 2019-01-09 | Discharge: 2019-01-09 | Disposition: A | Discharge status: Home / Self Care | Payer: Medicare Other | Attending: Emergency Medicine | Admitting: Emergency Medicine

## 2019-01-09 DIAGNOSIS — X509XXA Other and unspecified overexertion or strenuous movements or postures, initial encounter: Secondary | ICD-10-CM | POA: Insufficient documentation

## 2019-01-09 DIAGNOSIS — Y998 Other external cause status: Secondary | ICD-10-CM | POA: Diagnosis not present

## 2019-01-09 DIAGNOSIS — S79911A Unspecified injury of right hip, initial encounter: Secondary | ICD-10-CM | POA: Diagnosis present

## 2019-01-09 DIAGNOSIS — S73004A Unspecified dislocation of right hip, initial encounter: Secondary | ICD-10-CM | POA: Diagnosis not present

## 2019-01-09 DIAGNOSIS — I10 Essential (primary) hypertension: Secondary | ICD-10-CM | POA: Insufficient documentation

## 2019-01-09 DIAGNOSIS — Y9301 Activity, walking, marching and hiking: Secondary | ICD-10-CM | POA: Insufficient documentation

## 2019-01-09 DIAGNOSIS — Z96641 Presence of right artificial hip joint: Secondary | ICD-10-CM | POA: Insufficient documentation

## 2019-01-09 DIAGNOSIS — Y92010 Kitchen of single-family (private) house as the place of occurrence of the external cause: Secondary | ICD-10-CM | POA: Insufficient documentation

## 2019-01-09 DIAGNOSIS — Z79899 Other long term (current) drug therapy: Secondary | ICD-10-CM | POA: Insufficient documentation

## 2019-01-09 LAB — BASIC METABOLIC PANEL
Anion gap: 10 (ref 5–15)
BUN: 12 mg/dL (ref 6–20)
CO2: 25 mmol/L (ref 22–32)
Calcium: 9.6 mg/dL (ref 8.9–10.3)
Chloride: 104 mmol/L (ref 98–111)
Creatinine, Ser: 0.97 mg/dL (ref 0.61–1.24)
GFR calc Af Amer: >60 mL/min (ref >60)
GFR calc non Af Amer: >60 mL/min (ref >60)
Glucose, Bld: 117 mg/dL — ABNORMAL HIGH (ref 70–99)
Potassium: 3.7 mmol/L (ref 3.5–5.1)
Sodium: 139 mmol/L (ref 135–145)

## 2019-01-09 LAB — CBC WITH DIFFERENTIAL/PLATELET
Abs Immature Granulocytes: 0.02 10*3/uL (ref 0.00–0.07)
Basophils Absolute: 0.1 10*3/uL (ref 0.0–0.1)
Basophils Relative: 1 %
Eosinophils Absolute: 0.1 10*3/uL (ref 0.0–0.5)
Eosinophils Relative: 1 %
HCT: 42.1 % (ref 39.0–52.0)
Hemoglobin: 14.2 g/dL (ref 13.0–17.0)
Immature Granulocytes: 0 %
Lymphocytes Relative: 8 %
Lymphs Abs: 0.9 10*3/uL (ref 0.7–4.0)
MCH: 29.6 pg (ref 26.0–34.0)
MCHC: 33.7 g/dL (ref 30.0–36.0)
MCV: 87.7 fL (ref 80.0–100.0)
Monocytes Absolute: 1.4 10*3/uL — ABNORMAL HIGH (ref 0.1–1.0)
Monocytes Relative: 12 %
Neutro Abs: 9 10*3/uL — ABNORMAL HIGH (ref 1.7–7.7)
Neutrophils Relative %: 78 %
Platelets: 317 10*3/uL (ref 150–400)
RBC: 4.8 MIL/uL (ref 4.22–5.81)
RDW: 12.5 % (ref 11.5–15.5)
WBC: 11.5 10*3/uL — ABNORMAL HIGH (ref 4.0–10.5)
nRBC: 0 % (ref 0.0–0.2)

## 2019-01-09 MED ORDER — HYDROMORPHONE HCL 1 MG/ML IJ SOLN
1.0000 mg | Freq: Once | INTRAMUSCULAR | Status: AC
  Administered 2019-01-09: 1 mg via INTRAVENOUS
  Filled 2019-01-09: qty 1

## 2019-01-09 MED ORDER — PROPOFOL 10 MG/ML IV BOLUS
0.5000 mg/kg | INTRAVENOUS | Status: DC | PRN
  Administered 2019-01-09: 100 mg via INTRAVENOUS
  Filled 2019-01-09: qty 20

## 2019-01-09 MED ORDER — SODIUM CHLORIDE 0.9 % IV SOLN
INTRAVENOUS | Status: AC | PRN
  Administered 2019-01-09: 1000 mL via INTRAVENOUS

## 2019-01-09 NOTE — Sedation Documentation (Signed)
Hip reduced, x-ray notified to verify placement

## 2019-01-09 NOTE — ED Provider Notes (Addendum)
[facility] DEPT Provider Note   CSN: [id number] Arrival date & time: 01/09/19  1911     History   Chief Complaint Chief Complaint  Patient presents with  . Hip Injury    HPI Todd [name] is a 48 y.o. male.     HPI Patient presents to the ED for evaluation of hip dislocation.  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  Patient was walking in his kitchen when he felt his hip pop.  Patient is certain that he has another hip dislocation.  EMS was called they placed an IV and administered ketamine and fentanyl.  Patient denies any other injuries. Past Medical History:  Diagnosis Date  . Drug addiction ([place])   . GERD (gastroesophageal reflux disease)   . H/O hiatal hernia   . Hypertension     Patient Active Problem List   Diagnosis Date Noted  . Hypoxia 10/07/2015  . Mechanical complication of internal orthopedic device ([place]) 03/16/2015  . Hip dislocation, right ([place]) 02/18/2015  . Anemia 07/16/2014  . Altered mental status 10/20/2013  . Dislocation of hip prosthesis ([place]) 10/20/2013  . CAP (community acquired pneumonia) 10/20/2013  . Acute GI bleeding 09/19/2013  . Acute blood loss anemia 09/19/2013  . Essential hypertension 09/19/2013  . Sinus tachycardia 09/19/2013  . GASTROESOPHAGEAL REFLUX, NO ESOPHAGITIS 05/06/2006    Past Surgical History:  Procedure Laterality Date  . APPENDECTOMY     as child  . COLONOSCOPY WITH PROPOFOL  02/17/2012   Procedure: COLONOSCOPY WITH PROPOFOL;  Surgeon: Arta Silence, MD;  Location: [facility] ENDOSCOPY;  Service: Endoscopy;  Laterality: N/A;  . ESOPHAGOGASTRODUODENOSCOPY Left 09/19/2013   Procedure: ESOPHAGOGASTRODUODENOSCOPY (EGD);  Surgeon: Arta Silence, MD;  Location: [facility] ENDOSCOPY;  Service: Endoscopy;  Laterality: Left;  . ESOPHAGOGASTRODUODENOSCOPY (EGD) WITH PROPOFOL  02/17/2012   Procedure: ESOPHAGOGASTRODUODENOSCOPY (EGD) WITH PROPOFOL;  Surgeon: Arta Silence, MD;   Location: [facility] ENDOSCOPY;  Service: Endoscopy;  Laterality: N/A;  . HIP CLOSED REDUCTION Right 10/20/2013   Procedure: CLOSED REDUCTION HIP;  Surgeon: Marybelle Killings, MD;  Location: [facility];  Service: Orthopedics;  Laterality: Right;  . HIP CLOSED REDUCTION Right 02/18/2015   Procedure: CLOSED REDUCTION HIP;  Surgeon: Dorna Leitz, MD;  Location: [facility];  Service: Orthopedics;  Laterality: Right;  . HIP CLOSED REDUCTION Right 03/16/2015   Procedure: CLOSED REDUCTION HIP;  Surgeon: Mcarthur Rossetti, MD;  Location: [facility];  Service: Orthopedics;  Laterality: Right;  . JOINT REPLACEMENT  2005   Right Hip        Home Medications    Prior to Admission medications   Medication Sig Start Date End Date Taking? Authorizing Provider  busPIRone (BUSPAR) 10 MG tablet Take 10 mg by mouth 3 (three) times daily. 12/20/18  Yes [provider]  clotrimazole (LOTRIMIN) 1 % cream Apply 1 application topically 3 (three) times daily.   Yes [provider]  dexmethylphenidate (FOCALIN) 10 MG tablet Take 10 mg by mouth 2 (two) times daily. 12/14/18  Yes [provider]  diclofenac (CATAFLAM) 50 MG tablet Take 50 mg by mouth 2 (two) times daily. 01/04/19  Yes [provider]  FLUoxetine (PROZAC) 40 MG capsule Take 40 mg by mouth daily.   Yes [provider]  loratadine (CLARITIN) 10 MG tablet Take 10 mg by mouth daily as needed for allergies.    Yes [provider]  metoprolol (LOPRESSOR) 50 MG tablet Take 50 mg by mouth 2 (two) times daily.  Yes [provider]  Multiple Vitamin (MULTIVITAMIN) tablet Take 1 tablet by mouth daily.   Yes [provider]  omeprazole (PRILOSEC) 40 MG capsule Take 40 mg by mouth daily. 09/04/18  Yes [provider]  ondansetron (ZOFRAN) 4 MG tablet Take 4 mg by mouth every 8 (eight) hours as needed for nausea or vomiting.  11/26/18  Yes [provider]  OXYCONTIN 20 MG 12 hr tablet Take 20 mg by mouth  2 (two) times daily. 06/19/16  Yes [provider]  testosterone cypionate (DEPOTESTOSTERONE CYPIONATE) 200 MG/ML injection Inject 200 mg into the muscle every 14 (fourteen) days.  11/11/18  Yes [provider]  tiZANidine (ZANAFLEX) 4 MG tablet Take 4 mg by mouth 3 (three) times daily.  11/25/14  Yes [provider]  ibuprofen (ADVIL,MOTRIN) 800 MG tablet Take 1 tablet (800 mg total) by mouth 3 (three) times daily. Patient not taking: Reported on 01/09/2019 07/11/16   Eber Hong, MD    Family History Family History  Problem Relation Age of Onset  . Diabetes Mother     Social History Social History   Tobacco Use  . Smoking status: Never Smoker  . Smokeless tobacco: Never Used  Substance Use Topics  . Alcohol use: No  . Drug use: No    Types: IV     Allergies   Ambien [zolpidem tartrate], Nu-iron [polysaccharide iron complex], Fentanyl, and Clonidine derivatives   Review of Systems Review of Systems  All other systems reviewed and are negative.    Physical Exam Updated Vital Signs BP 136/88   Pulse (!) 102   Temp 97.7 F (36.5 C) (Oral)   Resp (!) 30   Ht 1.88 m ( )   Wt 102.1 kg   SpO2 99%   BMI 28.89 kg/m   Physical Exam Vitals signs and nursing note reviewed.  Constitutional:      General: He is not in acute distress.    Appearance: He is well-developed.  HENT:     Head: Normocephalic and atraumatic.     Right Ear: External ear normal.     Left Ear: External ear normal.  Eyes:     General: No scleral icterus.       Right eye: No discharge.        Left eye: No discharge.     Conjunctiva/sclera: Conjunctivae normal.  Neck:     Musculoskeletal: Neck supple.     Trachea: No tracheal deviation.  Cardiovascular:     Rate and Rhythm: Normal rate and regular rhythm.  Pulmonary:     Effort: Pulmonary effort is normal. No respiratory distress.     Breath sounds: Normal breath sounds. No stridor. No wheezing or rales.   Abdominal:     General: Bowel sounds are normal. There is no distension.     Palpations: Abdomen is soft.     Tenderness: There is no abdominal tenderness. There is no guarding or rebound.  Musculoskeletal:        General: No tenderness.     Comments: Right leg shortened, decreased mobility, sensation intact and patient is able to wiggle his toes bilaterally  Skin:    General: Skin is warm and dry.     Findings: No rash.  Neurological:     Mental Status: He is alert.     Cranial Nerves: No cranial nerve deficit (no facial droop, extraocular movements intact, no slurred speech).     Sensory: No sensory deficit.  Motor: No abnormal muscle tone or seizure activity.     Coordination: Coordination normal.      ED Treatments / Results  Labs (all labs ordered are listed, but only abnormal results are displayed) Labs Reviewed  CBC WITH DIFFERENTIAL/PLATELET - Abnormal; Notable for the following components:      Result Value   WBC 11.5 (*)    Neutro Abs 9.0 (*)    Monocytes Absolute 1.4 (*)    All other components within normal limits  BASIC METABOLIC PANEL - Abnormal; Notable for the following components:   Glucose, Bld 117 (*)    All other components within normal limits    EKG None  Radiology Dg Hip Unilat W Or Wo Pelvis 2-3 Views Right  Result Date: 01/09/2019 CLINICAL DATA:  Pain EXAM: DG HIP (WITH OR WITHOUT PELVIS) 2-3V RIGHT COMPARISON:  02/01/2017 FINDINGS: There is superior dislocation of the right hip replacement. No visible fracture. IMPRESSION: Right hip replacement with superior dislocation. Electronically Signed   By: Charlett Nose M.D.   On: 01/09/2019 20:21    Procedures .Sedation  Date/Time: 01/09/2019 8:58 PM Performed by: Linwood Dibbles, MD Authorized by: Linwood Dibbles, MD   Consent:    Consent obtained:  Verbal   Consent given by:  Patient   Risks discussed:  Allergic reaction, dysrhythmia, inadequate sedation, nausea, prolonged hypoxia resulting in organ  damage, prolonged sedation necessitating reversal, respiratory compromise necessitating ventilatory assistance and intubation and vomiting   Alternatives discussed:  Analgesia without sedation, anxiolysis and regional anesthesia Universal protocol:    Procedure explained and questions answered to patient or proxy's satisfaction: yes     Relevant documents present and verified: yes     Test results available and properly labeled: yes     Imaging studies available: yes     Required blood products, implants, devices, and special equipment available: yes     Site/side marked: yes     Immediately prior to procedure a time out was called: yes     Patient identity confirmation method:  Verbally with patient Indications:    Procedure necessitating sedation performed by:  Physician performing sedation Pre-sedation assessment:    Time since last food or drink:  8   ASA classification: class 1 - normal, healthy patient     Neck mobility: normal     Mouth opening:  3 or more finger widths   Thyromental distance:  4 finger widths   Mallampati score:  I - soft palate, uvula, fauces, pillars visible   Pre-sedation assessments completed and reviewed: airway patency, cardiovascular function, hydration status, mental status, nausea/vomiting, pain level, respiratory function and temperature     Pre-sedation assessment completed:  01/09/2019 8:28 PM Immediate pre-procedure details:    Reassessment: Patient reassessed immediately prior to procedure     Reviewed: vital signs, relevant labs/tests and NPO status     Verified: bag valve mask available, emergency equipment available, intubation equipment available, IV patency confirmed, oxygen available and suction available   Procedure details (see MAR for exact dosages):    Preoxygenation:  Nasal cannula   Sedation:  Propofol   Intended level of sedation: deep   Analgesia:  Hydromorphone   Intra-procedure monitoring:  Blood pressure monitoring, cardiac  monitor, continuous pulse oximetry, frequent LOC assessments, frequent vital sign checks and continuous capnometry   Intra-procedure events: none     Intra-procedure management:  Airway repositioning   Total Provider sedation time (minutes):  25 Post-procedure details:    Post-sedation assessment completed:  01/09/2019 8:59 PM   Attendance: Constant attendance by certified staff until patient recovered     Recovery: Patient returned to pre-procedure baseline     Post-sedation assessments completed and reviewed: airway patency, cardiovascular function, hydration status, mental status, nausea/vomiting, pain level, respiratory function and temperature     Patient is stable for discharge or admission: yes     Patient tolerance:  Tolerated well, no immediate complications .Ortho Injury Treatment  Date/Time: 01/09/2019 8:59 PM Performed by: Linwood DibblesKnapp, Apoorva Bugay, MD Authorized by: Linwood DibblesKnapp, Uel Davidow, MD   Consent:    Consent obtained:  Verbal   Consent given by:  Patient   Risks discussed:  Recurrent dislocation, irreducible dislocation, stiffness and restricted joint movement   Alternatives discussed:  Alternative treatmentInjury location: hip Location details: right hip Injury type: dislocation Spontaneous dislocation: yes Prosthesis: yes Pre-procedure neurovascular assessment: neurovascularly intact Pre-procedure distal perfusion: normal Pre-procedure neurological function: normal Pre-procedure range of motion: normal  Patient sedated: Yes. Refer to sedation procedure documentation for details of sedation. Manipulation performed: yes Reduction method: traction and counter traction Reduction successful: yes X-ray confirmed reduction: yes Post-procedure neurovascular assessment: post-procedure neurovascularly intact Post-procedure distal perfusion: normal Post-procedure neurological function: normal Post-procedure range of motion: normal Patient tolerance: patient tolerated the procedure well with no  immediate complications    (including critical care time)  Medications Ordered in ED Medications  propofol (DIPRIVAN) 10 mg/mL bolus/IV push 51.1 mg (100 mg Intravenous Given 01/09/19 2046)  HYDROmorphone (DILAUDID) injection 1 mg (1 mg Intravenous Given 01/09/19 1947)  0.9 %  sodium chloride infusion (1,000 mLs Intravenous New Bag/Given 01/09/19 2048)     Initial Impression / Assessment and Plan / ED Course  I have reviewed the triage vital signs and the nursing notes.  Pertinent labs & imaging results that were available during my care of the patient were reviewed by me and considered in my medical decision making (see chart for details).  Clinical Course as of Jan 08 2105  [facility]  2102 Labs reviewed.  No significant abnormalities.   [JK]  2105 Patient is awake and alert.  He states he is ready to leave.   [JK]  2105 Formal x-ray results are not back yet but I reviewed the films and the hip appears to be relocated.   [JK]    Clinical Course User Index [JK] Linwood DibblesKnapp, Kennetha Pearman, MD     Patient has history of recurrent hip dislocations.  X-ray was consistent with a recurrent dislocation.  Patient was sedated and successfully reduced.  Discharged in stable condition.  Discussed outpatient follow-up with his orthopedist.   Final Clinical Impressions(s) / ED Diagnoses   Final diagnoses:  Dislocation of right hip, initial encounter [facility](HCC)    ED Discharge Orders    None       Linwood DibblesKnapp, Cova Knieriem, MD 01/09/19 2103    Linwood DibblesKnapp, Albert Hersch, MD 01/09/19 2106

## 2019-01-09 NOTE — Progress Notes (Signed)
RT present for duration of sedation procedure.  Pt remained stable on EtCO2 monitoring Nasal Cannula with 2Lpm running the entire time.

## 2019-01-09 NOTE — ED Notes (Signed)
Pt A&O x4. Pulled all equipment off and states his ride is almost here. EDP made aware.

## 2019-01-09 NOTE — ED Notes (Signed)
Pt transported to Xray. 

## 2019-01-09 NOTE — ED Notes (Signed)
Pt refused knee immobilizer, pulled IV out, refused to review discharge paperwork or sign topaz. Pt is ambulatory on discharge. EDP present when pt walked out of ED,

## 2019-01-09 NOTE — ED Triage Notes (Signed)
Patient is from home and transported via [facility]. He has had 2 right hip replacements with the most recent one performed in 2018. Today, while patient was walking to the kitchen to get a drink, he heard a pop to his right hip. EMS obtained an IV, administered Fentanyl 20mg  IV and Ketamine 20 enroute with little pain relief. EMS states his leg is internally rotated with good pulse and circulation.

## 2019-01-29 ENCOUNTER — Encounter (HOSPITAL_COMMUNITY): Payer: Self-pay

## 2019-01-29 ENCOUNTER — Emergency Department (HOSPITAL_COMMUNITY)
Admission: EM | Admit: 2019-01-29 | Discharge: 2019-01-30 | Disposition: A | Discharge status: Home / Self Care | Payer: Medicare Other | Attending: Emergency Medicine | Admitting: Emergency Medicine

## 2019-01-29 DIAGNOSIS — Y792 Prosthetic and other implants, materials and accessory orthopedic devices associated with adverse incidents: Secondary | ICD-10-CM | POA: Insufficient documentation

## 2019-01-29 DIAGNOSIS — Y92019 Unspecified place in single-family (private) house as the place of occurrence of the external cause: Secondary | ICD-10-CM | POA: Insufficient documentation

## 2019-01-29 DIAGNOSIS — I10 Essential (primary) hypertension: Secondary | ICD-10-CM | POA: Insufficient documentation

## 2019-01-29 DIAGNOSIS — X58XXXA Exposure to other specified factors, initial encounter: Secondary | ICD-10-CM | POA: Diagnosis not present

## 2019-01-29 DIAGNOSIS — Z79899 Other long term (current) drug therapy: Secondary | ICD-10-CM | POA: Insufficient documentation

## 2019-01-29 DIAGNOSIS — Y939 Activity, unspecified: Secondary | ICD-10-CM | POA: Insufficient documentation

## 2019-01-29 DIAGNOSIS — T84020A Dislocation of internal right hip prosthesis, initial encounter: Secondary | ICD-10-CM | POA: Insufficient documentation

## 2019-01-29 DIAGNOSIS — S73004A Unspecified dislocation of right hip, initial encounter: Secondary | ICD-10-CM

## 2019-01-29 DIAGNOSIS — Y999 Unspecified external cause status: Secondary | ICD-10-CM | POA: Insufficient documentation

## 2019-01-29 NOTE — ED Triage Notes (Signed)
Pt arrived via [facility].  Pt states states he tried to break into his own house Thursday when he felt his hip "pop out of place". Pt has since been crawling around house to get places. Upon arrival pt's right leg appears shorter. Pt has a history of bilateral hip replacement. A&ox4

## 2019-01-30 ENCOUNTER — Emergency Department (HOSPITAL_COMMUNITY): Payer: Medicare Other

## 2019-01-30 ENCOUNTER — Other Ambulatory Visit: Payer: Self-pay

## 2019-01-30 LAB — BASIC METABOLIC PANEL
Anion gap: 10 (ref 5–15)
BUN: 13 mg/dL (ref 6–20)
CO2: 27 mmol/L (ref 22–32)
Calcium: 9.1 mg/dL (ref 8.9–10.3)
Chloride: 100 mmol/L (ref 98–111)
Creatinine, Ser: 0.99 mg/dL (ref 0.61–1.24)
GFR calc Af Amer: >60 mL/min (ref >60)
GFR calc non Af Amer: >60 mL/min (ref >60)
Glucose, Bld: 123 mg/dL — ABNORMAL HIGH (ref 70–99)
Potassium: 3.8 mmol/L (ref 3.5–5.1)
Sodium: 137 mmol/L (ref 135–145)

## 2019-01-30 LAB — CBC
HCT: 42.2 % (ref 39.0–52.0)
Hemoglobin: 14.2 g/dL (ref 13.0–17.0)
MCH: 29.7 pg (ref 26.0–34.0)
MCHC: 33.6 g/dL (ref 30.0–36.0)
MCV: 88.3 fL (ref 80.0–100.0)
Platelets: 175 10*3/uL (ref 150–400)
RBC: 4.78 MIL/uL (ref 4.22–5.81)
RDW: 12.2 % (ref 11.5–15.5)
WBC: 7.2 10*3/uL (ref 4.0–10.5)
nRBC: 0 % (ref 0.0–0.2)

## 2019-01-30 LAB — CK: Total CK: 1105 U/L — ABNORMAL HIGH (ref 49–397)

## 2019-01-30 MED ORDER — PROPOFOL 10 MG/ML IV BOLUS
100.0000 mg | Freq: Once | INTRAVENOUS | Status: DC
  Filled 2019-01-30: qty 20

## 2019-01-30 MED ORDER — KETAMINE HCL 50 MG/5ML IJ SOSY
100.0000 mg | PREFILLED_SYRINGE | Freq: Once | INTRAMUSCULAR | Status: DC
  Filled 2019-01-30: qty 10

## 2019-01-30 MED ORDER — ONDANSETRON HCL 4 MG/2ML IJ SOLN
4.0000 mg | Freq: Once | INTRAMUSCULAR | Status: DC
  Filled 2019-01-30: qty 2

## 2019-01-30 MED ORDER — PROPOFOL 10 MG/ML IV BOLUS
INTRAVENOUS | Status: AC | PRN
  Administered 2019-01-30: 50 mg via INTRAVENOUS

## 2019-01-30 MED ORDER — KETAMINE HCL 10 MG/ML IJ SOLN
INTRAMUSCULAR | Status: AC | PRN
  Administered 2019-01-30: 50 mg via INTRAVENOUS

## 2019-01-30 MED ORDER — SODIUM CHLORIDE 0.9 % IV BOLUS
1000.0000 mL | Freq: Once | INTRAVENOUS | Status: AC
  Administered 2019-01-30: 1000 mL via INTRAVENOUS

## 2019-01-30 NOTE — ED Provider Notes (Signed)
Attestation: Medical screening examination/treatment/procedure(s) were conducted as a shared visit with non-physician practitioner(s) and myself.  I personally evaluated the patient during the encounter.  Briefly, the patient is a 48 y.o. male with h/o right hip prosthesis and recurring dislocation, here for right hip dislocation.   Vitals:   01/30/19 0220 01/30/19 0225  BP: 112/82 111/71  Pulse: 75 82  Resp: 10 13  Temp:    SpO2: 100% 99%    CONSTITUTIONAL:  well-appearing, NAD NEURO:  Alert and oriented x 3, no focal deficits EYES:  pupils equal and reactive ENT/NECK:  trachea midline, no JVD CARDIO:  reg rate, reg rhythm, well-perfused PULM:  None labored breathing GI/GU:  Abdomin non-distended MSK/SPINE:  Right leg shortened and internally rotated SKIN:  no rash, atraumatic PSYCH:  Appropriate speech and behavior    EKG Interpretation  Date/Time:    Ventricular Rate:    PR Interval:    QRS Duration:   QT Interval:    QTC Calculation:   R Axis:     Text Interpretation:         Work up confirming right hip dislocation. Reduced under procedural sedation. Also noted to have mild rhabdo w/o renal insufficiency due to down time. Hydrated with IVF.   Marland KitchenSedation  Date/Time: 01/30/2019 2:05 AM Performed by: Fatima Blank, MD Authorized by: Fatima Blank, MD   Consent:    Consent obtained:  Written   Consent given by:  Patient   Risks discussed:  Allergic reaction, dysrhythmia, inadequate sedation, nausea, prolonged hypoxia resulting in organ damage, prolonged sedation necessitating reversal, respiratory compromise necessitating ventilatory assistance and intubation and vomiting   Alternatives discussed:  Analgesia without sedation, anxiolysis and regional anesthesia Universal protocol:    Procedure explained and questions answered to patient or proxy's satisfaction: yes     Relevant documents present and verified: yes     Test results available and  properly labeled: yes     Imaging studies available: yes     Required blood products, implants, devices, and special equipment available: yes     Site/side marked: yes     Immediately prior to procedure a time out was called: yes     Patient identity confirmation method:  Verbally with patient Indications:    Procedure necessitating sedation performed by:  Physician performing sedation Pre-sedation assessment:    Time since last food or drink:  2000   ASA classification: class 2 - patient with mild systemic disease     Neck mobility: normal     Mouth opening:  3 or more finger widths   Thyromental distance:  4 finger widths   Mallampati score:  I - soft palate, uvula, fauces, pillars visible   Pre-sedation assessments completed and reviewed: airway patency, cardiovascular function, hydration status, mental status, nausea/vomiting, pain level, respiratory function and temperature     Pre-sedation assessment completed:  01/30/2019 1:50 AM Immediate pre-procedure details:    Reassessment: Patient reassessed immediately prior to procedure     Reviewed: vital signs, relevant labs/tests and NPO status     Verified: bag valve mask available, emergency equipment available, intubation equipment available, IV patency confirmed, oxygen available and suction available   Procedure details (see MAR for exact dosages):    Preoxygenation:  Nasal cannula   Sedation:  Propofol   Intended level of sedation: deep   Intra-procedure monitoring:  Blood pressure monitoring, cardiac monitor, continuous pulse oximetry, frequent LOC assessments, frequent vital sign checks and continuous capnometry   Intra-procedure events:  none     Total Provider sedation time (minutes):  15   Post-sedation assessment completed: 01/30/2019  2:30 AM Post-procedure details:    Attendance: Constant attendance by certified staff until patient recovered     Recovery: Patient returned to pre-procedure baseline     Post-sedation  assessments completed and reviewed: airway patency, cardiovascular function, hydration status, mental status, nausea/vomiting, pain level, respiratory function and temperature     Patient is stable for discharge or admission: yes     Patient tolerance:  Tolerated well, no immediate complications .Ortho Injury Treatment  Date/Time: 01/30/2019 2:06 AM Performed by: Nira Conn, MD Authorized by: Nira Conn, MD   Consent:    Consent obtained:  Written   Consent given by:  Patient   Risks discussed:  Fracture, recurrent dislocation, stiffness, restricted joint movement and irreducible dislocation   Alternatives discussed:  Alternative treatmentInjury location: hip Location details: right hip Injury type: dislocation Dislocation type: posterior Prosthesis: yes Pre-procedure neurovascular assessment: neurovascularly intact  Patient sedated: Yes. Refer to sedation procedure documentation for details of sedation. Manipulation performed: yes Reduction method: Mod Cpt. Lequita Halt. Reduction successful: yes X-ray confirmed reduction: yes Immobilization: brace Splint type: long leg Post-procedure neurovascular assessment: post-procedure neurovascularly intact Patient tolerance: patient tolerated the procedure well with no immediate complications    The patient is safe for discharge with strict return precautions.        Nira Conn, MD 01/30/19 [phone]

## 2019-01-30 NOTE — Progress Notes (Signed)
Orthopedic Tech Progress Note Patient Details:  Todd [name] 12/26/1970 [id number]  Ortho Devices Type of Ortho Device: Knee Immobilizer Ortho Device/Splint Location: rle Ortho Device/Splint Interventions: Ordered, Application, Adjustment   Post Interventions Patient Tolerated: Well Instructions Provided: Care of device, Adjustment of device   Karolee Stamps 01/30/2019, 3:58 AM
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
## 2019-02-03 ENCOUNTER — Emergency Department (HOSPITAL_COMMUNITY): Payer: Medicare Other

## 2019-02-03 ENCOUNTER — Other Ambulatory Visit: Payer: Self-pay

## 2019-02-03 ENCOUNTER — Emergency Department (HOSPITAL_COMMUNITY)
Admission: EM | Admit: 2019-02-03 | Discharge: 2019-02-03 | Disposition: A | Discharge status: Home / Self Care | Payer: Medicare Other | Attending: Emergency Medicine | Admitting: Emergency Medicine

## 2019-02-03 ENCOUNTER — Encounter (HOSPITAL_COMMUNITY): Payer: Self-pay

## 2019-02-03 DIAGNOSIS — Z79891 Long term (current) use of opiate analgesic: Secondary | ICD-10-CM | POA: Insufficient documentation

## 2019-02-03 DIAGNOSIS — X58XXXA Exposure to other specified factors, initial encounter: Secondary | ICD-10-CM | POA: Diagnosis not present

## 2019-02-03 DIAGNOSIS — Z96641 Presence of right artificial hip joint: Secondary | ICD-10-CM | POA: Diagnosis not present

## 2019-02-03 DIAGNOSIS — Y929 Unspecified place or not applicable: Secondary | ICD-10-CM | POA: Insufficient documentation

## 2019-02-03 DIAGNOSIS — I10 Essential (primary) hypertension: Secondary | ICD-10-CM | POA: Insufficient documentation

## 2019-02-03 DIAGNOSIS — Z79899 Other long term (current) drug therapy: Secondary | ICD-10-CM | POA: Diagnosis not present

## 2019-02-03 DIAGNOSIS — S73004A Unspecified dislocation of right hip, initial encounter: Secondary | ICD-10-CM

## 2019-02-03 DIAGNOSIS — Y9301 Activity, walking, marching and hiking: Secondary | ICD-10-CM | POA: Insufficient documentation

## 2019-02-03 DIAGNOSIS — S79911A Unspecified injury of right hip, initial encounter: Secondary | ICD-10-CM | POA: Diagnosis present

## 2019-02-03 DIAGNOSIS — Y999 Unspecified external cause status: Secondary | ICD-10-CM | POA: Diagnosis not present

## 2019-02-03 DIAGNOSIS — M24451 Recurrent dislocation, right hip: Secondary | ICD-10-CM | POA: Diagnosis not present

## 2019-02-03 DIAGNOSIS — S73014A Posterior dislocation of right hip, initial encounter: Secondary | ICD-10-CM | POA: Insufficient documentation

## 2019-02-03 MED ORDER — OXYCODONE HCL 5 MG PO TABS
15.0000 mg | ORAL_TABLET | Freq: Once | ORAL | Status: AC
  Administered 2019-02-03: 15 mg via ORAL
  Filled 2019-02-03: qty 3

## 2019-02-03 MED ORDER — HYDROMORPHONE HCL 1 MG/ML IJ SOLN
1.0000 mg | Freq: Once | INTRAMUSCULAR | Status: AC
  Administered 2019-02-03: 1 mg via INTRAVENOUS
  Filled 2019-02-03: qty 1

## 2019-02-03 MED ORDER — KETAMINE HCL 50 MG/5ML IJ SOSY
1.0000 mg/kg | PREFILLED_SYRINGE | Freq: Once | INTRAMUSCULAR | Status: AC
  Administered 2019-02-03: 111 mg via INTRAVENOUS
  Filled 2019-02-03: qty 15

## 2019-02-03 MED ORDER — PROPOFOL 10 MG/ML IV BOLUS
INTRAVENOUS | Status: AC
  Administered 2019-02-03: 15:00:00
  Filled 2019-02-03: qty 20

## 2019-02-03 MED ORDER — PROPOFOL 10 MG/ML IV BOLUS
0.5000 mg/kg | Freq: Once | INTRAVENOUS | Status: AC
  Administered 2019-02-03: 25 mg via INTRAVENOUS
  Administered 2019-02-03 (×2): 50 mg via INTRAVENOUS
  Administered 2019-02-03: 25 mg via INTRAVENOUS
  Administered 2019-02-03: 100 mg via INTRAVENOUS
  Filled 2019-02-03: qty 20

## 2019-02-03 NOTE — ED Provider Notes (Signed)
Post reduction film pending, Dr. Ninfa Linden recommends f/u with his ortho. Chronic pain med at home. reconsult ortho if not reduced. If reduced, d/c after sedation wears off Physical Exam  BP 120/64   Pulse 67   Temp 97.7 F (36.5 C) (Oral)   Resp 20   SpO2 99%   Physical Exam Vitals signs and nursing note reviewed.  Constitutional:      General: He is not in acute distress.    Appearance: He is well-developed.     Comments: Patient is alert, sitting up in bed.    HENT:     Head: Normocephalic and atraumatic.  Pulmonary:     Effort: Pulmonary effort is normal.  Neurological:     Mental Status: He is alert.  Psychiatric:        Behavior: Behavior normal.     ED Course/Procedures   Clinical Course as of Feb 03 1536  Fri Feb 03, 2019  1342 Patient presents with right hip dislocation.  History of the same in the past multiple times.  Has not followed up with orthopedics.  Neurovascularly intact.   [KM]  119 48 year old male with multiple hip dislocations here again with a right hip dislocation complaining of severe pain.  X-rays show a prosthetic hip dislocation anteriorly.  Will need a sedation and reduction.   [MB]  1528 Reduction performed by Dr. Melina Copa with assistance from Dr. Regenia Skeeter. Waiting on post-reduction films. Care Passed to [place] Robinson due to change of shift. Plan to keep patient in knee immobilizer, crutches and he was counseled multiple times on his need to f/u with ortho. If films show that the hip is not reduced, consult ortho   [KM]    Clinical Course User Index [KM] Alveria Apley, PA-C [MB] Hayden Rasmussen, MD    Procedures  Dg Hip Unilat W Or Wo Pelvis 1 View Right  Result Date: 02/03/2019 CLINICAL DATA:  Reduction, status post hip dislocation EXAM: DG HIP (WITH OR WITHOUT PELVIS) 1V RIGHT COMPARISON:  Same day radiographs FINDINGS: Interval reduction of a previously seen superior posterior dislocation of right hip total arthroplasty. No evidence  of perihardware fracture or component loosening status post long-stem arthroplasty with cerclage wire about the femoral diaphysis. Subchondral sclerosis of the included left femoral head, in keeping with avascular necrosis. IMPRESSION: 1. Interval reduction of previously seen posterior dislocation of right hip arthroplasty. No evidence of perihardware fracture or component loosening. 2. Subchondral sclerosis of the included left femoral head, in keeping with avascular necrosis. Electronically Signed   By: Eddie Candle M.D.   On: 02/03/2019 15:43   Dg Hip Port Unilat W Or Wo Pelvis 1 View Right  Result Date: 02/03/2019 CLINICAL DATA:  Hip pain EXAM: DG HIP (WITH OR WITHOUT PELVIS) 1V PORT RIGHT COMPARISON:  January 30 2019 FINDINGS: There is dislocation of the femoral component of the right total hip arthroplasty with respect to the acetabular component. Dislocation is superior to the joint. Anterior-posterior translation is unclear on this AP view. No periprosthetic fracture. IMPRESSION: Recurrent dislocation of right hip arthroplasty. Electronically Signed   By: Macy Mis M.D.   On: 02/03/2019 13:24     MDM  Postreduction film with successful reduction.  Patient became alert and oriented and in no distress.  Requesting pain medication, patient given home dose of oxycodone.  Discharged with outpatient follow-up and symptomatic management per previous provider.     Robinson, [place] N, PA-C 02/03/19 1654    Hayden Rasmussen, MD  02/03/19 1813  
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
## 2019-02-03 NOTE — ED Provider Notes (Signed)
.  Sedation  Date/Time: 02/03/2019 3:23 PM Performed by: Hayden Rasmussen, MD Authorized by: Hayden Rasmussen, MD   Consent:    Consent obtained:  Verbal   Consent given by:  Patient   Risks discussed:  Allergic reaction, dysrhythmia, inadequate sedation, nausea, prolonged hypoxia resulting in organ damage, prolonged sedation necessitating reversal, respiratory compromise necessitating ventilatory assistance and intubation and vomiting   Alternatives discussed:  Analgesia without sedation, anxiolysis and regional anesthesia Universal protocol:    Procedure explained and questions answered to patient or proxy's satisfaction: yes     Relevant documents present and verified: yes     Test results available and properly labeled: yes     Imaging studies available: yes     Required blood products, implants, devices, and special equipment available: yes     Site/side marked: yes     Immediately prior to procedure a time out was called: yes     Patient identity confirmation method:  Verbally with patient Indications:    Procedure necessitating sedation performed by:  Physician performing sedation Pre-sedation assessment:    Time since last food or drink:  4   ASA classification: class 2 - patient with mild systemic disease     Neck mobility: normal     Mouth opening:  3 or more finger widths   Thyromental distance:  4 finger widths   Mallampati score:  I - soft palate, uvula, fauces, pillars visible   Pre-sedation assessments completed and reviewed: airway patency, cardiovascular function, hydration status, mental status, nausea/vomiting, pain level, respiratory function and temperature     Pre-sedation assessment completed:  02/03/2019 2:00 PM Immediate pre-procedure details:    Reassessment: Patient reassessed immediately prior to procedure     Reviewed: vital signs, relevant labs/tests and NPO status     Verified: bag valve mask available, emergency equipment available, intubation  equipment available, IV patency confirmed, oxygen available and suction available   Procedure details (see MAR for exact dosages):    Preoxygenation:  Nasal cannula   Sedation:  Propofol and ketamine   Intended level of sedation: deep   Intra-procedure monitoring:  Blood pressure monitoring, cardiac monitor, continuous pulse oximetry, frequent LOC assessments, frequent vital sign checks and continuous capnometry   Intra-procedure events: none     Total Provider sedation time (minutes):  40 Post-procedure details:    Post-sedation assessment completed:  02/03/2019 4:00 PM   Attendance: Constant attendance by certified staff until patient recovered     Recovery: Patient returned to pre-procedure baseline     Post-sedation assessments completed and reviewed: airway patency, cardiovascular function, hydration status, mental status, nausea/vomiting, pain level, respiratory function and temperature     Patient is stable for discharge or admission: yes     Patient tolerance:  Tolerated well, no immediate complications     Hayden Rasmussen, MD 02/03/19 1805

## 2019-02-03 NOTE — Discharge Instructions (Addendum)
Follow up with your orthopedic doctor at [facility] ASAP for further treatments. ALWAYS wear your knee immobilizer and use your crutches to avoid this happening again. Thank you for allowing me to care for you today. Please return to the emergency department if you have new or worsening symptoms.

## 2019-02-03 NOTE — ED Triage Notes (Signed)
Pt arrived via EMS from home. Pt has hx or Rt  hip dislocation reoccurrence, pt denies any falls or injuries at this time.

## 2019-02-03 NOTE — ED Triage Notes (Signed)
RT hip shortening.

## 2019-02-05 ENCOUNTER — Emergency Department (HOSPITAL_COMMUNITY)
Admission: EM | Admit: 2019-02-05 | Discharge: 2019-02-05 | Disposition: A | Discharge status: Home / Self Care | Payer: Medicare Other | Attending: Emergency Medicine | Admitting: Emergency Medicine

## 2019-02-05 ENCOUNTER — Emergency Department (HOSPITAL_COMMUNITY): Payer: Medicare Other

## 2019-02-05 ENCOUNTER — Other Ambulatory Visit: Payer: Self-pay

## 2019-02-05 DIAGNOSIS — Z79899 Other long term (current) drug therapy: Secondary | ICD-10-CM | POA: Diagnosis not present

## 2019-02-05 DIAGNOSIS — Y939 Activity, unspecified: Secondary | ICD-10-CM | POA: Insufficient documentation

## 2019-02-05 DIAGNOSIS — Y792 Prosthetic and other implants, materials and accessory orthopedic devices associated with adverse incidents: Secondary | ICD-10-CM | POA: Insufficient documentation

## 2019-02-05 DIAGNOSIS — S79911A Unspecified injury of right hip, initial encounter: Secondary | ICD-10-CM | POA: Diagnosis present

## 2019-02-05 DIAGNOSIS — I10 Essential (primary) hypertension: Secondary | ICD-10-CM | POA: Insufficient documentation

## 2019-02-05 DIAGNOSIS — X500XXA Overexertion from strenuous movement or load, initial encounter: Secondary | ICD-10-CM | POA: Insufficient documentation

## 2019-02-05 DIAGNOSIS — Y92003 Bedroom of unspecified non-institutional (private) residence as the place of occurrence of the external cause: Secondary | ICD-10-CM | POA: Diagnosis not present

## 2019-02-05 DIAGNOSIS — T84020A Dislocation of internal right hip prosthesis, initial encounter: Secondary | ICD-10-CM | POA: Diagnosis not present

## 2019-02-05 DIAGNOSIS — S73004A Unspecified dislocation of right hip, initial encounter: Secondary | ICD-10-CM

## 2019-02-05 DIAGNOSIS — Y999 Unspecified external cause status: Secondary | ICD-10-CM | POA: Insufficient documentation

## 2019-02-05 MED ORDER — MORPHINE SULFATE (PF) 4 MG/ML IV SOLN
8.0000 mg | Freq: Once | INTRAVENOUS | Status: AC
  Administered 2019-02-05: 8 mg via INTRAVENOUS
  Filled 2019-02-05: qty 2

## 2019-02-05 MED ORDER — PROPOFOL 10 MG/ML IV BOLUS
100.0000 mg | Freq: Once | INTRAVENOUS | Status: AC
  Administered 2019-02-05: 150 mg via INTRAVENOUS
  Filled 2019-02-05: qty 20

## 2019-02-05 MED ORDER — SODIUM CHLORIDE 0.9 % IV SOLN
INTRAVENOUS | Status: DC
  Administered 2019-02-05: 07:00:00 via INTRAVENOUS

## 2019-02-05 NOTE — ED Triage Notes (Signed)
Pt presents to ED via [facility] coming from home c/o right hip pain and recent sedation to reduction of same 8 hours ago. Pt has had this happen 3 times now and states this morning he had been wearing the brace they sent him home with but there where places that were digging into his skin that were uncomfortable and so he undid the brace and his hip popped back out again.

## 2019-02-05 NOTE — ED Notes (Addendum)
Ortho tech at bedside 

## 2019-02-05 NOTE — Sedation Documentation (Signed)
8381 - 50 propofol per verbal from Zenia Resides, MD [phone] - 25 propofol per verbal from Zenia Resides, [place] - 25 per verbal from Zenia Resides, [place] - 25 per verbal from Zenia Resides, [place] - 25 per verbal from Zenia Resides, MD (for a total of 150 of propofol) 0818 - R hip reduction successful by Zenia Resides, [place] - Patient verbal, alert and oriented

## 2019-02-05 NOTE — ED Provider Notes (Signed)
[facility] DEPT Provider Note   CSN: [id number] Arrival date & time: 02/05/19  4098     History   Chief Complaint Chief Complaint  Patient presents with  . Hip Pain    HPI Todd [name] is a 48 y.o. male.     48 year old male with history of recurrent right hip dislocations presents after his right hip became dislocated again when he made a certain movement on the bed.  Patient seen recently for similar symptoms and complaints and had a hip brace on which he took off.  Now notes sharp pain to his right hip worse with any movement.  Denies any distal numbness or tingling.     Past Medical History:  Diagnosis Date  . Drug addiction ([place])   . GERD (gastroesophageal reflux disease)   . H/O hiatal hernia   . Hypertension     Patient Active Problem List   Diagnosis Date Noted  . Hypoxia 10/07/2015  . Mechanical complication of internal orthopedic device ([place]) 03/16/2015  . Hip dislocation, right ([place]) 02/18/2015  . Anemia 07/16/2014  . Altered mental status 10/20/2013  . 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 CAP (community acquired pneumonia) 10/20/2013  . Acute GI bleeding 09/19/2013  . Acute blood loss anemia 09/19/2013  . Essential hypertension 09/19/2013  . Sinus tachycardia 09/19/2013  . GASTROESOPHAGEAL REFLUX, NO ESOPHAGITIS 05/06/2006    Past Surgical History:  Procedure Laterality Date  . APPENDECTOMY     as child  . COLONOSCOPY WITH PROPOFOL  02/17/2012   Procedure: COLONOSCOPY WITH PROPOFOL;  Surgeon: Arta Silence, MD;  Location: [facility];  Service: Endoscopy;  Laterality: N/A;  . ESOPHAGOGASTRODUODENOSCOPY Left 09/19/2013   Procedure: ESOPHAGOGASTRODUODENOSCOPY (EGD);  Surgeon: Arta Silence, MD;  Location: [facility] ENDOSCOPY;  Service: Endoscopy;  Laterality: Left;  . ESOPHAGOGASTRODUODENOSCOPY (EGD) WITH PROPOFOL  02/17/2012   Procedure: ESOPHAGOGASTRODUODENOSCOPY (EGD) WITH PROPOFOL;  Surgeon: Arta Silence, MD;  Location: [facility] END[facility];  Service: Endoscopy;  Laterality: N/A;  . HIP CLOSED REDUCTION Right 10/20/2013   Procedure: CLOSED REDUCTION HIP;  Surgeon: Marybelle Killings, MD;  Location: [facility];  Service: Orthopedics;  Laterality: Right;  . HIP CLOSED REDUCTION Right 02/18/2015   Procedure: CLOSED REDUCTION HIP;  Surgeon: Dorna Leitz, MD;  Location: [facility];  Service: Orthopedics;  Laterality: Right;  . HIP CLOSED REDUCTION Right 03/16/2015   Procedure: CLOSED REDUCTION HIP;  Surgeon: Mcarthur Rossetti, MD;  Location: [facility];  Service: Orthopedics;  Laterality: Right;  . JOINT REPLACEMENT  2005   Right Hip        Home Medications    Prior to Admission medications   Medication Sig Start Date End Date Taking? Authorizing Provider  clotrimazole (LOTRIMIN) 1 % cream Apply 1 application topically 3 (three) times daily.    [provider]  FLUoxetine (PROZAC) 40 MG capsule Take 40 mg by mouth daily.    [provider]  ibuprofen (ADVIL,MOTRIN) 800 MG tablet Take 1 tablet (800 mg total) by mouth 3 (three) times daily. Patient not taking: Reported on 01/09/2019 07/11/16   Noemi Chapel, MD  loratadine (CLARITIN) 10 MG tablet Take 10 mg by mouth daily as needed for allergies.     [provider]  metoprolol (LOPRESSOR) 50 MG tablet Take 50 mg by mouth daily.     [provider]  Multiple Vitamin (MULTIVITAMIN) tablet Take 1 tablet by mouth daily.    [provider]  omeprazole (PRILOSEC) 40 MG capsule Take 40  mg by mouth daily. 09/04/18   [provider]  ondansetron (ZOFRAN) 4 MG tablet Take 4 mg by mouth every 8 (eight) hours as needed for nausea or vomiting.  11/26/18   [provider]  oxyCODONE (ROXICODONE) 15 MG immediate release tablet Take 15 mg by mouth 4 (four) times daily as needed for pain. 01/13/19   [provider]  OXYCONTIN 20 MG 12 hr tablet Take 20 mg by mouth 2 (two) times daily. 06/19/16   [provider]  testosterone cypionate (DEPOTESTOSTERONE CYPIONATE) 200 MG/ML injection Inject 200 mg into the muscle every 14 (fourteen) days.  11/11/18   [provider]  tiZANidine (ZANAFLEX) 4 MG tablet Take 4 mg by mouth 3 (three) times daily.  11/25/14   [provider]    Family History Family History  Problem Relation Age of Onset  . Diabetes Mother     Social History Social History   Tobacco Use  . Smoking status: Never Smoker  . Smokeless tobacco: Never Used  Substance Use Topics  . Alcohol use: No  . Drug use: No    Types: IV     Allergies   Ambien [zolpidem tartrate], Nu-iron [polysaccharide iron complex], Fentanyl, and Clonidine derivatives   Review of Systems Review of Systems  All other systems reviewed and are negative.    Physical Exam Updated Vital Signs BP 104/85 (BP Location: Right Arm)   Pulse 74   Temp 97.8 F (36.6 C) (Oral)   Resp 19   Ht 1.88 m (6\' 2" )   Wt 111.1 kg   SpO2 95%   BMI 31.46 kg/m   Physical Exam Vitals signs and nursing note reviewed.  Constitutional:      General: He is not in acute distress.    Appearance: Normal appearance. He is well-developed. He is not toxic-appearing.  HENT:     Head: Normocephalic and atraumatic.  Eyes:     General: Lids are normal.     Conjunctiva/sclera: Conjunctivae normal.     Pupils: Pupils are equal, round, and reactive to light.  Neck:     Musculoskeletal: Normal range of motion and neck supple.     Thyroid: No thyroid mass.     Trachea: No tracheal deviation.  Cardiovascular:     Rate and Rhythm: Normal rate and regular rhythm.     Heart sounds: Normal heart sounds. No murmur. No gallop.   Pulmonary:     Effort: Pulmonary effort is normal. No respiratory distress.     Breath sounds: Normal breath sounds. No stridor. No decreased breath sounds, wheezing, rhonchi or rales.  Abdominal:     General: Bowel sounds are normal. There is no distension.     Palpations: Abdomen is  soft.     Tenderness: There is no abdominal tenderness. There is no rebound.  Musculoskeletal: Normal range of motion.        General: No tenderness.     Comments: Right lower extremity shortened and internally rotated.  Neurovascular intact at right foot.  Skin:    General: Skin is warm and dry.     Findings: No abrasion or rash.  Neurological:     Mental Status: He is alert and oriented to person, place, and time.     GCS: GCS eye subscore is 4. GCS verbal subscore is 5. GCS motor subscore is 6.     Cranial Nerves: No cranial nerve deficit.     Sensory: No sensory deficit.  Psychiatric:  Speech: Speech normal.        Behavior: Behavior normal.      ED Treatments / Results  Labs (all labs ordered are listed, but only abnormal results are displayed) Labs Reviewed - No data to display  EKG None  Radiology Dg Hip Unilat W Or Wo Pelvis 1 View Right  Result Date: 02/03/2019 CLINICAL DATA:  Reduction, status post hip dislocation EXAM: DG HIP (WITH OR WITHOUT PELVIS) 1V RIGHT COMPARISON:  Same day radiographs FINDINGS: Interval reduction of a previously seen superior posterior dislocation of right hip total arthroplasty. No evidence of perihardware fracture or component loosening status post long-stem arthroplasty with cerclage wire about the femoral diaphysis. Subchondral sclerosis of the included left femoral head, in keeping with avascular necrosis. IMPRESSION: 1. Interval reduction of previously seen posterior dislocation of right hip arthroplasty. No evidence of perihardware fracture or component loosening. 2. Subchondral sclerosis of the included left femoral head, in keeping with avascular necrosis. Electronically Signed   By: Lauralyn Primes M.D.   On: 02/03/2019 15:43   Dg Hip Port Unilat W Or Wo Pelvis 1 View Right  Result Date: 02/03/2019 CLINICAL DATA:  Hip pain EXAM: DG HIP (WITH OR WITHOUT PELVIS) 1V PORT RIGHT COMPARISON:  January 30 2019 FINDINGS: There is  dislocation of the femoral component of the right total hip arthroplasty with respect to the acetabular component. Dislocation is superior to the joint. Anterior-posterior translation is unclear on this AP view. No periprosthetic fracture. IMPRESSION: Recurrent dislocation of right hip arthroplasty. Electronically Signed   By: Guadlupe Spanish M.D.   On: 02/03/2019 13:24    Procedures .Sedation  Date/Time: 02/05/2019 8:22 AM Performed by: Lorre Nick, MD Authorized by: Lorre Nick, MD   Consent:    Consent obtained:  Verbal   Consent given by:  Patient   Risks discussed:  Allergic reaction, dysrhythmia, inadequate sedation, nausea, prolonged hypoxia resulting in organ damage, prolonged sedation necessitating reversal, respiratory compromise necessitating ventilatory assistance and intubation and vomiting   Alternatives discussed:  Analgesia without sedation, anxiolysis and regional anesthesia Universal protocol:    Procedure explained and questions answered to patient or proxy's satisfaction: yes     Relevant documents present and verified: yes     Test results available and properly labeled: yes     Imaging studies available: yes     Required blood products, implants, devices, and special equipment available: yes     Site/side marked: yes     Immediately prior to procedure a time out was called: yes     Patient identity confirmation method:  Verbally with patient Indications:    Procedure necessitating sedation performed by:  Physician performing sedation Pre-sedation assessment:    Time since last food or drink:  Last pm   ASA classification: class 1 - normal, healthy patient     Neck mobility: normal     Mouth opening:  3 or more finger widths   Thyromental distance:  4 finger widths   Mallampati score:  I - soft palate, uvula, fauces, pillars visible   Pre-sedation assessments completed and reviewed: airway patency, cardiovascular function, hydration status, mental status,  nausea/vomiting, pain level, respiratory function and temperature     Pre-sedation assessment completed:  02/05/2019 8:00 AM Immediate pre-procedure details:    Reassessment: Patient reassessed immediately prior to procedure     Reviewed: vital signs, relevant labs/tests and NPO status     Verified: bag valve mask available, emergency equipment available, intubation equipment available, IV patency confirmed,  oxygen available and suction available   Procedure details (see MAR for exact dosages):    Preoxygenation:  Nasal cannula   Sedation:  Propofol   Intended level of sedation: deep   Analgesia:  Morphine   Intra-procedure monitoring:  Blood pressure monitoring, cardiac monitor, continuous pulse oximetry, frequent LOC assessments, frequent vital sign checks and continuous capnometry   Intra-procedure events: none     Total Provider sedation time (minutes):  20 Post-procedure details:    Post-sedation assessment completed:  02/05/2019 8:23 AM   Attendance: Constant attendance by certified staff until patient recovered     Recovery: Patient returned to pre-procedure baseline     Post-sedation assessments completed and reviewed: airway patency, cardiovascular function, hydration status, mental status, nausea/vomiting, pain level, respiratory function and temperature     Patient is stable for discharge or admission: yes     Patient tolerance:  Tolerated well, no immediate complications Reduction of dislocation  Date/Time: 02/05/2019 8:24 AM Performed by: Lorre Nick, MD Authorized by: Lorre Nick, MD  Consent: Verbal consent obtained. Written consent obtained. Risks and benefits: risks, benefits and alternatives were discussed Consent given by: patient Time out: Immediately prior to procedure a "time out" was called to verify the correct patient, procedure, equipment, support staff and site/side marked as required.  Sedation: Patient sedated: yes Sedatives: propofol Sedation  start date/time: 02/05/2019 8:00 AM Sedation end date/time: 02/05/2019 8:24 AM Vitals: Vital signs were monitored during sedation.  Patient tolerance: patient tolerated the procedure well with no immediate complications Comments: Right hip anterior dislocation reduced with traction countertraction    (including critical care time)  Medications Ordered in ED Medications  morphine 4 MG/ML injection 8 mg (has no administration in time range)  0.9 %  sodium chloride infusion (has no administration in time range)  propofol (DIPRIVAN) 10 mg/mL bolus/IV push 100 mg (has no administration in time range)     Initial Impression / Assessment and Plan / ED Course  I have reviewed the triage vital signs and the nursing notes.  Pertinent labs & imaging results that were available during my care of the patient were reviewed by me and considered in my medical decision making (see chart for details).        Patient's hip successfully relocated.  Splint applied by me.  Patient encouraged to follow-up with orthopedics  Final Clinical Impressions(s) / ED Diagnoses   Final diagnoses:  None    ED Discharge Orders    None       Lorre Nick, MD 02/05/19 [phone]

## 2019-02-06 ENCOUNTER — Emergency Department (HOSPITAL_COMMUNITY): Payer: Medicare Other

## 2019-02-06 ENCOUNTER — Encounter (HOSPITAL_COMMUNITY): Payer: Self-pay

## 2019-02-06 ENCOUNTER — Emergency Department (HOSPITAL_COMMUNITY)
Admission: EM | Admit: 2019-02-06 | Discharge: 2019-02-06 | Disposition: A | Discharge status: Home / Self Care | Payer: Medicare Other | Attending: Emergency Medicine | Admitting: Emergency Medicine

## 2019-02-06 ENCOUNTER — Other Ambulatory Visit: Payer: Self-pay

## 2019-02-06 DIAGNOSIS — X509XXA Other and unspecified overexertion or strenuous movements or postures, initial encounter: Secondary | ICD-10-CM | POA: Diagnosis not present

## 2019-02-06 DIAGNOSIS — Y998 Other external cause status: Secondary | ICD-10-CM | POA: Diagnosis not present

## 2019-02-06 DIAGNOSIS — S73014A Posterior dislocation of right hip, initial encounter: Secondary | ICD-10-CM | POA: Insufficient documentation

## 2019-02-06 DIAGNOSIS — I10 Essential (primary) hypertension: Secondary | ICD-10-CM | POA: Insufficient documentation

## 2019-02-06 DIAGNOSIS — S79911A Unspecified injury of right hip, initial encounter: Secondary | ICD-10-CM | POA: Diagnosis present

## 2019-02-06 DIAGNOSIS — Y9389 Activity, other specified: Secondary | ICD-10-CM | POA: Insufficient documentation

## 2019-02-06 DIAGNOSIS — Z96641 Presence of right artificial hip joint: Secondary | ICD-10-CM | POA: Insufficient documentation

## 2019-02-06 DIAGNOSIS — Y92018 Other place in single-family (private) house as the place of occurrence of the external cause: Secondary | ICD-10-CM | POA: Insufficient documentation

## 2019-02-06 DIAGNOSIS — Z79899 Other long term (current) drug therapy: Secondary | ICD-10-CM | POA: Insufficient documentation

## 2019-02-06 MED ORDER — PROPOFOL 10 MG/ML IV BOLUS
INTRAVENOUS | Status: AC | PRN
  Administered 2019-02-06 (×4): 50 mg via INTRAVENOUS

## 2019-02-06 MED ORDER — HYDROMORPHONE HCL 1 MG/ML IJ SOLN
1.0000 mg | Freq: Once | INTRAMUSCULAR | Status: AC
  Administered 2019-02-06: 1 mg via INTRAVENOUS
  Filled 2019-02-06: qty 1

## 2019-02-06 MED ORDER — PROPOFOL 10 MG/ML IV BOLUS
0.5000 mg/kg | Freq: Once | INTRAVENOUS | Status: AC
  Administered 2019-02-06: 55.6 mg via INTRAVENOUS
  Filled 2019-02-06: qty 20

## 2019-02-06 NOTE — ED Notes (Signed)
EKG have been done on pt, and pt is on the cardiac monitor.

## 2019-02-06 NOTE — Progress Notes (Signed)
Orthopedic Tech Progress Note Patient Details:  Todd [name] [birth year] [id number] Assisted to relocate hip and he did have his KI with him. Ortho Devices Type of Ortho Device: Other (comment)       Ladell Pier [facility] 02/06/2019, 4:05 PM

## 2019-02-06 NOTE — ED Notes (Signed)
Family at bedside. 

## 2019-02-06 NOTE — ED Triage Notes (Signed)
Pt BIB EMS from home. Pt c/o right hip dislocation. Pt left [facility] yesterday for right hip dislocation. Pt states this has happened multiple times recently. Pt states he has been using his brace and crutches as educated.   150 mcg fentanyl given by EMS  147/62 76 HR 18 Resp 98% RA 98.8 temp  20G L hand

## 2019-02-06 NOTE — ED Provider Notes (Signed)
[facility]   CSN: [id number] Arrival date & time: 02/06/19  1244     History   Chief Complaint Chief Complaint  Patient presents with  . Right Hip Dislocation    HPI Todd [name] is a 48 y.o. male.     The history is provided by the patient and medical records. No language interpreter was used.     48 year old male with hx of recurrent right prostatic hip dislocation brought here via EMS from home for evaluation and managements of right hip dislocation.  Patient report he was leaning over to put on his shoes when his right hip pops out despite him wearing the leg brace that was previously provided.  He is currently complaining of 10 out of 10 sharp throbbing pain to his right hip radiates to his right thigh.  No new numbness or weakness.  This is patient's third ER visit within the past 4 days for right hip dislocation requiring reduction under conscious sedation.  Patient's orthopedist is at [facility].  He has not had a chance to reach out to his orthopedist yet.  No specific treatment tried.  Patient specifically requesting for Dilaudid for pain management.  He does have chronic right leg pain from prior injury.    Past Medical History:  Diagnosis Date  . Drug addiction (HCC)   . GERD (gastroesophageal reflux disease)   . H/O hiatal hernia   . Hypertension     Patient Active Problem List   Diagnosis Date Noted  . Hypoxia 10/07/2015  . Mechanical complication of internal orthopedic device (HCC) 03/16/2015  . Hip dislocation, right (HCC) 02/18/2015  . Anemia 07/16/2014  . Altered mental status 10/20/2013  . Dislocation of hip prosthesis (HCC) 10/20/2013  . CAP (community acquired pneumonia) 10/20/2013  . Acute GI bleeding 09/19/2013  . Acute blood loss anemia 09/19/2013  . Essential hypertension 09/19/2013  . Sinus tachycardia 09/19/2013  . GASTROESOPHAGEAL REFLUX, NO ESOPHAGITIS 05/06/2006    Past  Surgical History:  Procedure Laterality Date  . APPENDECTOMY     as child  . COLONOSCOPY WITH PROPOFOL  02/17/2012   Procedure: COLONOSCOPY WITH PROPOFOL;  Surgeon: Willis Modena, MD;  Location: [facility] END[facility]COPY;  Service: Endoscopy;  Laterality: N/A;  . ESOPHAGOGASTRODUODENOSCOPY Left 09/19/2013   Procedure: ESOPHAGOGASTRODUODENOSCOPY (EGD);  Surgeon: Willis Modena, MD;  Location: [facility] ENDOSCOPY;  Service: Endoscopy;  Laterality: Left;  . ESOPHAGOGASTRODUODENOSCOPY (EGD) WITH PROPOFOL  02/17/2012   Procedure: ESOPHAGOGASTRODUODENOSCOPY (EGD) WITH PROPOFOL;  Surgeon: Willis Modena, MD;  Location: [facility] ENDOSCOPY;  Service: Endoscopy;  Laterality: N/A;  . HIP CLOSED REDUCTION Right 10/20/2013   Procedure: CLOSED REDUCTION HIP;  Surgeon: Eldred Manges, MD;  Location: [facility] OR;  Service: Orthopedics;  Laterality: Right;  . HIP CLOSED REDUCTION Right 02/18/2015   Procedure: CLOSED REDUCTION HIP;  Surgeon: Jodi Geralds, MD;  Location: [facility] ORS;  Service: Orthopedics;  Laterality: Right;  . HIP CLOSED REDUCTION Right 03/16/2015   Procedure: CLOSED REDUCTION HIP;  Surgeon: Kathryne Hitch, MD;  Location: [facility] OR;  Service: Orthopedics;  Laterality: Right;  . JOINT REPLACEMENT  2005   Right Hip        Home Medications    Prior to Admission medications   Medication Sig Start Date End Date Taking? Authorizing Provider  clotrimazole (LOTRIMIN) 1 % cream Apply 1 application topically 3 (three) times daily.    [provider]  FLUoxetine (PROZAC) 40 MG capsule Take 40 mg by  mouth daily.    [provider]  ibuprofen (ADVIL,MOTRIN) 800 MG tablet Take 1 tablet (800 mg total) by mouth 3 (three) times daily. Patient not taking: Reported on 01/09/2019 07/11/16   Noemi Chapel, MD  loratadine (CLARITIN) 10 MG tablet Take 10 mg by mouth daily as needed for allergies.     [provider]  metoprolol (LOPRESSOR) 50 MG tablet Take 50 mg by mouth daily.     [provider]  Multiple  Vitamin (MULTIVITAMIN) tablet Take 1 tablet by mouth daily.    [provider]  omeprazole (PRILOSEC) 40 MG capsule Take 40 mg by mouth daily. 09/04/18   [provider]  ondansetron (ZOFRAN) 4 MG tablet Take 4 mg by mouth every 8 (eight) hours as needed for nausea or vomiting.  11/26/18   [provider]  oxyCODONE (ROXICODONE) 15 MG immediate release tablet Take 15 mg by mouth 4 (four) times daily as needed for pain. 01/13/19   [provider]  OXYCONTIN 20 MG 12 hr tablet Take 20 mg by mouth 2 (two) times daily. 06/19/16   [provider]  testosterone cypionate (DEPOTESTOSTERONE CYPIONATE) 200 MG/ML injection Inject 200 mg into the muscle every 14 (fourteen) days.  11/11/18   [provider]  tiZANidine (ZANAFLEX) 4 MG tablet Take 4 mg by mouth 3 (three) times daily.  11/25/14   [provider]    Family History Family History  Problem Relation Age of Onset  . Diabetes Mother     Social History Social History   Tobacco Use  . Smoking status: Never Smoker  . Smokeless tobacco: Never Used  Substance Use Topics  . Alcohol use: No  . Drug use: No    Types: IV     Allergies   Ambien [zolpidem tartrate], Nu-iron [polysaccharide iron complex], Fentanyl, and Clonidine derivatives   Review of Systems Review of Systems  All other systems reviewed and are negative.    Physical Exam Updated Vital Signs BP (!) 142/76   Pulse 87   Temp 98.4 F (36.9 C) (Oral)   Resp 18   SpO2 99%   Physical Exam Vitals signs and nursing note reviewed.  Constitutional:      General: He is not in acute distress.    Appearance: He is well-developed.  HENT:     Head: Atraumatic.  Eyes:     Conjunctiva/sclera: Conjunctivae normal.  Neck:     Musculoskeletal: Neck supple.  Cardiovascular:     Rate and Rhythm: Normal rate and regular rhythm.     Pulses: Normal pulses.     Heart sounds: Normal heart sounds.  Abdominal:      Palpations: Abdomen is soft.     Tenderness: There is no abdominal tenderness.  Musculoskeletal:        General: Tenderness (Right hip: Right leg is shortened and internally rotated with decreased range of motion to the right hip.  sensation intact to R thigh) present.  Skin:    Findings: No rash.  Neurological:     Mental Status: He is alert and oriented to person, place, and time.  Psychiatric:        Mood and Affect: Mood normal.      ED Treatments / Results  Labs (all labs ordered are listed, but only abnormal results are displayed) Labs Reviewed - No data to display  EKG None  Radiology Dg Hip Unilat W Or Wo Pelvis 1 View Right  Result Date: 02/05/2019 CLINICAL DATA:  Dislocated right hip arthroplasty. EXAM: DG HIP (WITH OR WITHOUT PELVIS) 1V RIGHT COMPARISON:  Film at 0727 hours FINDINGS: The right hip arthroplasty dislocation has been reduced with normal alignment now present. No acute fracture identified. IMPRESSION: Normal alignment of the right hip arthroplasty. Electronically Signed   By: Irish LackGlenn  Yamagata M.D.   On: 02/05/2019 09:05   Dg Hip Unilat W Or Wo Pelvis 1 View Right  Result Date: 02/05/2019 CLINICAL DATA:  Recurrent right hip dislocation. EXAM: DG HIP (WITH OR WITHOUT PELVIS) 1V RIGHT COMPARISON:  02/03/2019 FINDINGS: A right total hip arthroplasty is again seen with recurrent superolateral dislocation of the prosthetic femoral head relative to the acetabular component. No acute fracture is identified. IMPRESSION: Recurrent dislocation of right hip arthroplasty. Electronically Signed   By: Sebastian AcheAllen  Grady M.D.   On: 02/05/2019 07:54    Procedures .Ortho Injury Treatment  Date/Time: 02/06/2019 3:18 PM Performed by: Fayrene Helperran, Delayla Hoffmaster, PA-C Authorized by: Fayrene Helperran, Kohan Azizi, PA-C   Consent:    Consent obtained:  Written   Consent given by:  Patient   Risks discussed:  Recurrent dislocation   Alternatives discussed:  No treatmentInjury location: hip Location details:  right hip Injury type: dislocation Dislocation type: posterior Spontaneous dislocation: yes Prosthesis: yes Pre-procedure neurovascular assessment: neurovascularly intact Pre-procedure distal perfusion: normal Pre-procedure neurological function: normal Pre-procedure range of motion: reduced  Anesthesia: Local anesthesia used: no  Patient sedated: Yes. Refer to sedation procedure documentation for details of sedation. Manipulation performed: yes Reduction method: traction and counter traction Reduction successful: yes X-ray confirmed reduction: yes Immobilization: brace Splint type: knee immobilizer. Post-procedure neurovascular assessment: post-procedure neurovascularly intact Post-procedure distal perfusion: normal Post-procedure neurological function: normal Post-procedure range of motion: normal Patient tolerance: patient tolerated the procedure well with no immediate complications    (including critical care time)  Medications Ordered in ED Medications  HYDROmorphone (DILAUDID) injection 1 mg (1 mg Intravenous Given 02/06/19 1400)  propofol (DIPRIVAN) 10 mg/mL bolus/IV push 55.6 mg (55.6 mg Intravenous Given 02/06/19 1401)  propofol (DIPRIVAN) 10 mg/mL bolus/IV push (50 mg Intravenous Given 02/06/19 1511)     Initial Impression / Assessment and Plan / ED Course  I have reviewed the triage vital signs and the nursing notes.  Pertinent labs & imaging results that were available during my care of the patient were reviewed by me and considered in my medical decision making (see chart for details).        BP 109/62   Pulse 76   Temp 97.7 F (36.5 C) (Oral)   Resp 11   SpO2 98%    Final Clinical Impressions(s) / ED Diagnoses   Final diagnoses:  Closed posterior dislocation of right hip, initial encounter [facility](HCC)    ED Discharge Orders    None     1:53 PM Patient with 3 recurrent dislocations of right prosthetic hip.  This is patient's third ER visit  within the past 4 days for same.  Will attempt to reach out to patient's orthopedist for close follow-up.  Plan to reduce right hip under conscious sedation.  Care discussed with Dr. Judd Lienelo.   3:17 PM Successful reduction of R hip under conscious sedation.  No adverse event.  I have also consulted pt's ortho DR. Langfitt who will have office to reach out to the patient for close outpt f/u.  Pt agrees with plan.     Fayrene Helperran, Astin Rape, PA-C 02/06/19 1544    Geoffery Lyonselo, [place], MD 02/07/19 [phone]

## 2019-02-06 NOTE — Discharge Instructions (Addendum)
Dr. Doristine Section office will call you to set up a close follow up appointment.  Meanwhile, please wear knee immobilizer at all time to prevent recurrent dislocation of your right hip.

## 2019-02-07 NOTE — ED Provider Notes (Signed)
[place] DEPT Provider Note   CSN: [id number] Arrival date & time: 02/06/19  1244     History   Chief Complaint Chief Complaint  Patient presents with  . Right Hip Dislocation    HPI Todd [name] is a 48 y.o. male.     Patient with recurrent right hip dislocation.  This occurred while getting dressed.  He has been here on several occasions this month with a similar presentation.  He is due to see his orthopedic surgeon at [facility] to discuss revision.  Patient denies any fall or trauma otherwise.  The history is provided by the patient.    Past Medical History:  Diagnosis Date  . Drug addiction ([place])   . GERD (gastroesophageal reflux disease)   . H/O hiatal hernia   . Hypertension     Patient Active Problem List   Diagnosis Date Noted  . Hypoxia 10/07/2015  . Mechanical complication of internal orthopedic device ([place]) 03/16/2015  . Hip dislocation, right ([place]) 02/18/2015  . Anemia 07/16/2014  . Altered mental status 10/20/2013  . Dislocation of hip prosthesis ([place]) 10/20/2013  . CAP (community acquired pneumonia) 10/20/2013  . Acute GI bleeding 09/19/2013  . Acute blood loss anemia 09/19/2013  . Essential hypertension 09/19/2013  . Sinus tachycardia 09/19/2013  . GASTROESOPHAGEAL REFLUX, NO ESOPHAGITIS 05/06/2006    Past Surgical History:  Procedure Laterality Date  . APPENDECTOMY     as child  . COLONOSCOPY WITH PROPOFOL  02/17/2012   Procedure: COLONOSCOPY WITH PROPOFOL;  Surgeon: Arta Silence, MD;  Location: [facility] ENDOSCOPY;  Service: Endoscopy;  Laterality: N/A;  . ESOPHAGOGASTRODUODENOSCOPY Left 09/19/2013   Procedure: ESOPHAGOGASTRODUODENOSCOPY (EGD);  Surgeon: Arta Silence, MD;  Location: [facility] ENDOSCOPY;  Service: Endoscopy;  Laterality: Left;  . ESOPHAGOGASTRODUODENOSCOPY (EGD) WITH PROPOFOL  02/17/2012   Procedure: ESOPHAGOGASTRODUODENOSCOPY (EGD) WITH PROPOFOL;  Surgeon: Arta Silence, MD;  Location: [facility]  ENDOSCOPY;  Service: Endoscopy;  Laterality: N/A;  . HIP CLOSED REDUCTION Right 10/20/2013   Procedure: CLOSED REDUCTION HIP;  Surgeon: Marybelle Killings, MD;  Location: [place];  Service: Orthopedics;  Laterality: Right;  . HIP CLOSED REDUCTION Right 02/18/2015   Procedure: CLOSED REDUCTION HIP;  Surgeon: Dorna Leitz, MD;  Location: [facility] ORS;  Service: Orthopedics;  Laterality: Right;  . HIP CLOSED REDUCTION Right 03/16/2015   Procedure: CLOSED REDUCTION HIP;  Surgeon: Mcarthur Rossetti, MD;  Location: [place];  Service: Orthopedics;  Laterality: Right;  . JOINT REPLACEMENT  2005   Right Hip        Home Medications    Prior to Admission medications   Medication Sig Start Date End Date Taking? Authorizing Provider  clotrimazole (LOTRIMIN) 1 % cream Apply 1 application topically 3 (three) times daily.    [provider]  FLUoxetine (PROZAC) 40 MG capsule Take 40 mg by mouth daily.    [provider]  ibuprofen (ADVIL,MOTRIN) 800 MG tablet Take 1 tablet (800 mg total) by mouth 3 (three) times daily. Patient not taking: Reported on 01/09/2019 07/11/16   Noemi Chapel, MD  loratadine (CLARITIN) 10 MG tablet Take 10 mg by mouth daily as needed for allergies.     [provider]  metoprolol (LOPRESSOR) 50 MG tablet Take 50 mg by mouth daily.     [provider]  Multiple Vitamin (MULTIVITAMIN) tablet Take 1 tablet by mouth daily.    [provider]  omeprazole (PRILOSEC) 40 MG capsule Take 40 mg by mouth daily. 09/04/18   [provider]  ondansetron (ZOFRAN) 4 MG tablet Take 4 mg by mouth every 8 (eight) hours as needed for nausea or vomiting.  11/26/18   [provider]  oxyCODONE (ROXICODONE) 15 MG immediate release tablet Take 15 mg by mouth 4 (four) times daily as needed for pain. 01/13/19   [provider]  OXYCONTIN 20 MG 12 hr tablet Take 20 mg by mouth 2 (two) times daily. 06/19/16   [provider]  testosterone  cypionate (DEPOTESTOSTERONE CYPIONATE) 200 MG/ML injection Inject 200 mg into the muscle every 14 (fourteen) days.  11/11/18   [provider]  tiZANidine (ZANAFLEX) 4 MG tablet Take 4 mg by mouth 3 (three) times daily.  11/25/14   [provider]    Family History Family History  Problem Relation Age of Onset  . Diabetes Mother     Social History Social History   Tobacco Use  . Smoking status: Never Smoker  . Smokeless tobacco: Never Used  Substance Use Topics  . Alcohol use: No  . Drug use: No    Types: IV     Allergies   Ambien [zolpidem tartrate], Nu-iron [polysaccharide iron complex], Fentanyl, and Clonidine derivatives   Review of Systems Review of Systems  All other systems reviewed and are negative.    Physical Exam Updated Vital Signs BP 129/71   Pulse 81   Temp 98 F (36.7 C) (Axillary)   Resp 20   SpO2 98%   Physical Exam Vitals signs and nursing note reviewed.  Constitutional:      General: He is not in acute distress.    Appearance: He is well-developed. He is not diaphoretic.  HENT:     Head: Normocephalic and atraumatic.  Neck:     Musculoskeletal: Normal range of motion and neck supple.  Cardiovascular:     Rate and Rhythm: Normal rate and regular rhythm.     Heart sounds: No murmur. No friction rub.  Pulmonary:     Effort: Pulmonary effort is normal. No respiratory distress.     Breath sounds: Normal breath sounds. No wheezing or rales.  Abdominal:     General: Bowel sounds are normal. There is no distension.     Palpations: Abdomen is soft.     Tenderness: There is no abdominal tenderness.  Musculoskeletal: Normal range of motion.     Comments: The right hip is shortened and internally rotated.  DP and PT pulses are palpable and motor and sensation are intact to the entire foot.  Skin:    General: Skin is warm and dry.  Neurological:     Mental Status: He is alert and oriented to person, place, and time.      Coordination: Coordination normal.      ED Treatments / Results  Labs (all labs ordered are listed, but only abnormal results are displayed) Labs Reviewed - No data to display  EKG None  Radiology Dg Hip Unilat W Or Wo Pelvis 1 View Right  Result Date: 02/05/2019 CLINICAL DATA:  Dislocated right hip arthroplasty. EXAM: DG HIP (WITH OR WITHOUT PELVIS) 1V RIGHT COMPARISON:  Film at 0727 hours FINDINGS: The right hip arthroplasty dislocation has been reduced with normal alignment now present. No acute fracture identified. IMPRESSION: Normal alignment of the right hip arthroplasty. Electronically Signed   By: Irish Lack M.D.   On: 02/05/2019 09:05   Dg Hip Port Unilat W Or Wo Pelvis 1 View Right  Result Date: 02/06/2019 CLINICAL DATA:  48 year old male  with right hip dislocation. EXAM: DG HIP (WITH OR WITHOUT PELVIS) 1V PORT RIGHT COMPARISON:  Right hip radiograph dated 02/05/2019. FINDINGS: There is a total right hip arthroplasty. The heart plastic components appear intact and in anatomic alignment on this single frontal view. There is no acute fracture or dislocation. Subcutaneous soft tissue stranding of the lateral right hip may represent edema or contusion. Clinical correlation is recommended. IMPRESSION: No acute fracture or dislocation. Normal alignment of the right hip arthroplasty. Electronically Signed   By: Elgie Collard M.D.   On: 02/06/2019 16:08    Procedures .Sedation  Date/Time: 02/07/2019 8:14 AM Performed by: Geoffery Lyons, MD Authorized by: Geoffery Lyons, MD   Consent:    Consent obtained:  Verbal   Consent given by:  Patient   Risks discussed:  Allergic reaction, dysrhythmia, inadequate sedation, nausea, prolonged hypoxia resulting in organ damage, prolonged sedation necessitating reversal, respiratory compromise necessitating ventilatory assistance and intubation and vomiting   Alternatives discussed:  Analgesia without sedation, anxiolysis and regional  anesthesia Universal protocol:    Procedure explained and questions answered to patient or proxy's satisfaction: yes     Relevant documents present and verified: yes     Test results available and properly labeled: yes     Imaging studies available: yes     Required blood products, implants, devices, and special equipment available: yes     Site/side marked: yes     Immediately prior to procedure a time out was called: yes     Patient identity confirmation method:  Verbally with patient Indications:    Procedure necessitating sedation performed by:  Physician performing sedation Pre-sedation assessment:    Time since last food or drink:  4 hours   ASA classification: class 1 - normal, healthy patient     Neck mobility: normal     Mouth opening:  3 or more finger widths   Thyromental distance:  4 finger widths   Mallampati score:  I - soft palate, uvula, fauces, pillars visible   Pre-sedation assessments completed and reviewed: airway patency, cardiovascular function, hydration status, mental status, nausea/vomiting, pain level, respiratory function and temperature   Immediate pre-procedure details:    Reassessment: Patient reassessed immediately prior to procedure     Reviewed: vital signs, relevant labs/tests and NPO status     Verified: bag valve mask available, emergency equipment available, intubation equipment available, IV patency confirmed, oxygen available and suction available   Procedure details (see MAR for exact dosages):    Preoxygenation:  Nasal cannula   Sedation:  Propofol   Intended level of sedation: deep   Intra-procedure monitoring:  Blood pressure monitoring, cardiac monitor, continuous pulse oximetry, frequent LOC assessments, frequent vital sign checks and continuous capnometry   Intra-procedure events: none     Total Provider sedation time (minutes):  15 Post-procedure details:    Attendance: Constant attendance by certified staff until patient recovered      Recovery: Patient returned to pre-procedure baseline     Post-sedation assessments completed and reviewed: airway patency, cardiovascular function, hydration status, mental status, nausea/vomiting, pain level, respiratory function and temperature     Patient is stable for discharge or admission: yes     Patient tolerance:  Tolerated well, no immediate complications Comments:     Patient given conscious sedation with propofol with successful reduction of the hip dislocation.  Patient tolerated the procedure well.   (including critical care time)  Medications Ordered in ED Medications  HYDROmorphone (DILAUDID) injection 1 mg (1  mg Intravenous Given 02/06/19 1400)  propofol (DIPRIVAN) 10 mg/mL bolus/IV push 55.6 mg (55.6 mg Intravenous Given 02/06/19 1401)  propofol (DIPRIVAN) 10 mg/mL bolus/IV push (50 mg Intravenous Given 02/06/19 1511)     Initial Impression / Assessment and Plan / ED Course  I have reviewed the triage vital signs and the nursing notes.  Pertinent labs & imaging results that were available during my care of the patient were reviewed by me and considered in my medical decision making (see chart for details).  Patient underwent successful reduction of dislocated hip as per procedure note by Fayrene HelperBowie Tran PA.    Final Clinical Impressions(s) / ED Diagnoses   Final diagnoses:  Closed posterior dislocation of right hip, initial encounter [facility](HCC)    ED Discharge Orders    None       Geoffery Lyonselo, Dajsha Massaro, MD 02/07/19 1547

## 2019-03-13 ENCOUNTER — Other Ambulatory Visit: Payer: Self-pay

## 2019-03-13 ENCOUNTER — Emergency Department (HOSPITAL_COMMUNITY): Payer: Medicare HMO

## 2019-03-13 ENCOUNTER — Emergency Department (HOSPITAL_COMMUNITY)
Admission: EM | Admit: 2019-03-13 | Discharge: 2019-03-13 | Disposition: A | Discharge status: Home / Self Care | Payer: Medicare HMO | Attending: Emergency Medicine | Admitting: Emergency Medicine

## 2019-03-13 DIAGNOSIS — Y939 Activity, unspecified: Secondary | ICD-10-CM | POA: Diagnosis not present

## 2019-03-13 DIAGNOSIS — S73004A Unspecified dislocation of right hip, initial encounter: Secondary | ICD-10-CM

## 2019-03-13 DIAGNOSIS — Y999 Unspecified external cause status: Secondary | ICD-10-CM | POA: Insufficient documentation

## 2019-03-13 DIAGNOSIS — I10 Essential (primary) hypertension: Secondary | ICD-10-CM | POA: Diagnosis not present

## 2019-03-13 DIAGNOSIS — Y929 Unspecified place or not applicable: Secondary | ICD-10-CM | POA: Diagnosis not present

## 2019-03-13 DIAGNOSIS — Z96641 Presence of right artificial hip joint: Secondary | ICD-10-CM | POA: Diagnosis not present

## 2019-03-13 DIAGNOSIS — M24451 Recurrent dislocation, right hip: Secondary | ICD-10-CM | POA: Diagnosis not present

## 2019-03-13 DIAGNOSIS — Z79899 Other long term (current) drug therapy: Secondary | ICD-10-CM | POA: Insufficient documentation

## 2019-03-13 DIAGNOSIS — X501XXA Overexertion from prolonged static or awkward postures, initial encounter: Secondary | ICD-10-CM | POA: Diagnosis not present

## 2019-03-13 MED ORDER — ONDANSETRON HCL 4 MG/2ML IJ SOLN
4.0000 mg | Freq: Once | INTRAMUSCULAR | Status: AC
  Administered 2019-03-13: 4 mg via INTRAVENOUS
  Filled 2019-03-13: qty 2

## 2019-03-13 MED ORDER — PROPOFOL 10 MG/ML IV BOLUS
1.0000 mg/kg | Freq: Once | INTRAVENOUS | Status: DC
  Filled 2019-03-13: qty 20

## 2019-03-13 MED ORDER — PROPOFOL 10 MG/ML IV BOLUS
INTRAVENOUS | Status: AC | PRN
  Administered 2019-03-13: 50 mg via INTRAVENOUS
  Administered 2019-03-13: 100 mg via INTRAVENOUS

## 2019-03-13 MED ORDER — SODIUM CHLORIDE 0.9 % IV BOLUS (SEPSIS)
1000.0000 mL | Freq: Once | INTRAVENOUS | Status: AC
  Administered 2019-03-13: 1000 mL via INTRAVENOUS

## 2019-03-13 NOTE — Sedation Documentation (Signed)
Respiratory bagged.

## 2019-03-13 NOTE — ED Notes (Signed)
Pt unable to find a ride. Patient requested this RN call him a taxi, RN called taxi company

## 2019-03-13 NOTE — Sedation Documentation (Signed)
Dr. Elesa Massed reduced right hip

## 2019-03-13 NOTE — Sedation Documentation (Signed)
Ortho placed splint.

## 2019-03-13 NOTE — ED Notes (Signed)
Patient is attempting to call for a ride, RN gave patient a charger so that he could charge his phone and call for a ride
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
## 2019-03-13 NOTE — Discharge Instructions (Addendum)
Please follow-up with your orthopedic physician at [facility].
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
## 2019-04-01 ENCOUNTER — Emergency Department (HOSPITAL_COMMUNITY)
Admission: EM | Admit: 2019-04-01 | Discharge: 2019-04-01 | Disposition: A | Discharge status: Home / Self Care | Payer: Medicare HMO | Attending: Emergency Medicine | Admitting: Emergency Medicine

## 2019-04-01 ENCOUNTER — Emergency Department (HOSPITAL_COMMUNITY): Payer: Medicare HMO

## 2019-04-01 ENCOUNTER — Other Ambulatory Visit: Payer: Self-pay

## 2019-04-01 DIAGNOSIS — X501XXA Overexertion from prolonged static or awkward postures, initial encounter: Secondary | ICD-10-CM | POA: Diagnosis not present

## 2019-04-01 DIAGNOSIS — S73004A Unspecified dislocation of right hip, initial encounter: Secondary | ICD-10-CM | POA: Diagnosis not present

## 2019-04-01 DIAGNOSIS — Z79899 Other long term (current) drug therapy: Secondary | ICD-10-CM | POA: Insufficient documentation

## 2019-04-01 DIAGNOSIS — Z79891 Long term (current) use of opiate analgesic: Secondary | ICD-10-CM | POA: Diagnosis not present

## 2019-04-01 DIAGNOSIS — Y999 Unspecified external cause status: Secondary | ICD-10-CM | POA: Diagnosis not present

## 2019-04-01 DIAGNOSIS — Y929 Unspecified place or not applicable: Secondary | ICD-10-CM | POA: Insufficient documentation

## 2019-04-01 DIAGNOSIS — Y939 Activity, unspecified: Secondary | ICD-10-CM | POA: Insufficient documentation

## 2019-04-01 DIAGNOSIS — I10 Essential (primary) hypertension: Secondary | ICD-10-CM | POA: Diagnosis not present

## 2019-04-01 DIAGNOSIS — Z96641 Presence of right artificial hip joint: Secondary | ICD-10-CM | POA: Insufficient documentation

## 2019-04-01 MED ORDER — MORPHINE SULFATE (PF) 2 MG/ML IV SOLN
2.0000 mg | Freq: Once | INTRAVENOUS | Status: AC
  Administered 2019-04-01: 21:00:00 2 mg via INTRAVENOUS
  Filled 2019-04-01: qty 1

## 2019-04-01 MED ORDER — PROPOFOL 10 MG/ML IV BOLUS
INTRAVENOUS | Status: AC | PRN
  Administered 2019-04-01: 55 mg via INTRAVENOUS

## 2019-04-01 MED ORDER — PROPOFOL 10 MG/ML IV BOLUS
INTRAVENOUS | Status: AC | PRN
  Administered 2019-04-01: 40 mg via INTRAVENOUS

## 2019-04-01 MED ORDER — PROPOFOL 10 MG/ML IV BOLUS
INTRAVENOUS | Status: AC | PRN
  Administered 2019-04-01: 50 mg via INTRAVENOUS

## 2019-04-01 MED ORDER — PROPOFOL 10 MG/ML IV BOLUS
0.5000 mg/kg | Freq: Once | INTRAVENOUS | Status: DC
  Filled 2019-04-01: qty 20

## 2019-04-01 MED ORDER — PROPOFOL 10 MG/ML IV BOLUS
INTRAVENOUS | Status: AC
  Filled 2019-04-01: qty 20

## 2019-04-01 MED ORDER — SODIUM CHLORIDE 0.9 % IV BOLUS
1000.0000 mL | Freq: Once | INTRAVENOUS | Status: AC
  Administered 2019-04-01: 21:00:00 1000 mL via INTRAVENOUS

## 2019-04-01 MED ORDER — ONDANSETRON HCL 4 MG/2ML IJ SOLN
4.0000 mg | Freq: Once | INTRAMUSCULAR | Status: AC
  Administered 2019-04-01: 21:00:00 4 mg via INTRAVENOUS
  Filled 2019-04-01: qty 2

## 2019-04-01 NOTE — ED Provider Notes (Signed)
[facility] DEPT Provider Note   CSN: [id number] Arrival date & time: 04/01/19  1943     History Chief Complaint  Patient presents with  . Dislocation    right hip/femur    Todd [name] is a 49 y.o. male past medical history drug addiction, hypertension brought in by EMS for evaluation of right leg pain and possible dislocation. 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 He states that he has had issues with it dislocating before. He reports that about 7 PM today, he was sitting down on the couch and states that his leg bent in an abnormal way and then dislocated. He has had difficulty ambulating bearing weight since then. Patient denies any numbness/weakness. Per EMS, patient was slightly drowsy when they picked him up and was more jittery than normal. He received 250 mcg of fentanyl prior to ED arrival.  The history is provided by the patient.       Past Medical History:  Diagnosis Date  . Drug addiction ([place])   . GERD (gastroesophageal reflux disease)   . H/O hiatal hernia   . Hypertension     Patient Active Problem List   Diagnosis Date Noted  . Hypoxia 10/07/2015  . Mechanical complication of internal orthopedic device ([place]) 03/16/2015  . Hip dislocation, right ([place]) 02/18/2015  . Anemia 07/16/2014  . Altered mental status 10/20/2013  . Dislocation of hip prosthesis ([place]) 10/20/2013  . CAP (community acquired pneumonia) 10/20/2013  . Acute GI bleeding 09/19/2013  . Acute blood loss anemia 09/19/2013  . Essential hypertension 09/19/2013  . Sinus tachycardia 09/19/2013  . GASTROESOPHAGEAL REFLUX, NO ESOPHAGITIS 05/06/2006    Past Surgical History:  Procedure Laterality Date  . APPENDECTOMY     as child  . COLONOSCOPY WITH PROPOFOL  02/17/2012   Procedure: COLONOSCOPY WITH PROPOFOL;  Surgeon: Arta Silence, MD;  Location: [facility] ENDOSCOPY;  Service: Endoscopy;  Laterality: N/A;  . ESOPHAGOGASTRODUODENOSCOPY Left  09/19/2013   Procedure: ESOPHAGOGASTRODUODENOSCOPY (EGD);  Surgeon: Arta Silence, MD;  Location: [facility] ENDOSCOPY;  Service: Endoscopy;  Laterality: Left;  . ESOPHAGOGASTRODUODENOSCOPY (EGD) WITH PROPOFOL  02/17/2012   Procedure: ESOPHAGOGASTRODUODENOSCOPY (EGD) WITH PROPOFOL;  Surgeon: Arta Silence, MD;  Location: [facility] ENDOSCOPY;  Service: Endoscopy;  Laterality: N/A;  . HIP CLOSED REDUCTION Right 10/20/2013   Procedure: CLOSED REDUCTION HIP;  Surgeon: Marybelle Killings, MD;  Location: [facility];  Service: Orthopedics;  Laterality: Right;  . HIP CLOSED REDUCTION Right 02/18/2015   Procedure: CLOSED REDUCTION HIP;  Surgeon: Dorna Leitz, MD;  Location: [facility];  Service: Orthopedics;  Laterality: Right;  . HIP CLOSED REDUCTION Right 03/16/2015   Procedure: CLOSED REDUCTION HIP;  Surgeon: Mcarthur Rossetti, MD;  Location: [facility];  Service: Orthopedics;  Laterality: Right;  . JOINT REPLACEMENT  2005   Right Hip       Family History  Problem Relation Age of Onset  . Diabetes Mother     Social History   Tobacco Use  . Smoking status: Never Smoker  . Smokeless tobacco: Never Used  Substance Use Topics  . Alcohol use: No  . Drug use: No    Types: IV    Home Medications Prior to Admission medications   Medication Sig Start Date End Date Taking? Authorizing Provider  clotrimazole (LOTRIMIN) 1 % cream Apply 1 application topically 3 (three) times daily.    [provider]  FLUoxetine (PROZAC) 40 MG capsule Take 40 mg by mouth daily.  [provider]  ibuprofen (ADVIL,MOTRIN) 800 MG tablet Take 1 tablet (800 mg total) by mouth 3 (three) times daily. Patient not taking: Reported on 01/09/2019 07/11/16   Eber Hong, MD  loratadine (CLARITIN) 10 MG tablet Take 10 mg by mouth daily as needed for allergies.     [provider]  metoprolol (LOPRESSOR) 50 MG tablet Take 50 mg by mouth daily.     [provider]  Multiple Vitamin (MULTIVITAMIN) tablet Take 1 tablet by  mouth daily.    [provider]  omeprazole (PRILOSEC) 40 MG capsule Take 40 mg by mouth daily. 09/04/18   [provider]  ondansetron (ZOFRAN) 4 MG tablet Take 4 mg by mouth every 8 (eight) hours as needed for nausea or vomiting.  11/26/18   [provider]  oxyCODONE (ROXICODONE) 15 MG immediate release tablet Take 15 mg by mouth 4 (four) times daily as needed for pain. 01/13/19   [provider]  OXYCONTIN 20 MG 12 hr tablet Take 20 mg by mouth 2 (two) times daily. 06/19/16   [provider]  testosterone cypionate (DEPOTESTOSTERONE CYPIONATE) 200 MG/ML injection Inject 200 mg into the muscle every 14 (fourteen) days.  11/11/18   [provider]  tiZANidine (ZANAFLEX) 4 MG tablet Take 4 mg by mouth 3 (three) times daily.  11/25/14   [provider]    Allergies    Ambien [zolpidem tartrate], Nu-iron [polysaccharide iron complex], Fentanyl, and Clonidine derivatives  Review of Systems   Review of Systems  Cardiovascular: Negative for chest pain.  Gastrointestinal: Negative for abdominal pain, nausea and vomiting.  Genitourinary: Negative for dysuria.  Musculoskeletal:       Right hip pain  Neurological: Negative for weakness and numbness.  All other systems reviewed and are negative.   Physical Exam Updated Vital Signs BP (!) 116/98 (BP Location: Left Leg)   Pulse 89   Temp 98.5 F (36.9 C) (Oral)   Resp 14   Ht 5\' 8"  (1.727 m)   Wt 110 kg   SpO2 98%   BMI 36.87 kg/m   Physical Exam Vitals and nursing note reviewed.  Constitutional:      Appearance: Normal appearance. He is well-developed.  HENT:     Head: Normocephalic and atraumatic.  Eyes:     General: Lids are normal.     Conjunctiva/sclera: Conjunctivae normal.     Pupils: Pupils are equal, round, and reactive to light.  Cardiovascular:     Rate and Rhythm: Normal rate and regular rhythm.     Pulses: Normal pulses.          Dorsalis pedis pulses are 2+ on  the right side and 2+ on the left side.     Heart sounds: Normal heart sounds. No murmur. No friction rub. No gallop.   Pulmonary:     Effort: Pulmonary effort is normal.     Breath sounds: Normal breath sounds.  Abdominal:     Palpations: Abdomen is soft. Abdomen is not rigid.     Tenderness: There is no abdominal tenderness. There is no guarding.  Musculoskeletal:        General: Normal range of motion.     Cervical back: Full passive range of motion without pain.     Comments: Tenderness palpation noted to right hip. His right lower extremity is shortened and slightly rotated. No bony tenderness noted to right knee, right tib-fib, right ankle. No tenderness palpation of the left lower extremity. Full range  of motion of left lower extremity without any difficulty.  Skin:    General: Skin is warm and dry.     Capillary Refill: Capillary refill takes less than 2 seconds.  Neurological:     Mental Status: He is alert and oriented to person, place, and time.  Psychiatric:        Speech: Speech normal.     ED Results / Procedures / Treatments   Labs (all labs ordered are listed, but only abnormal results are displayed) Labs Reviewed - No data to display  EKG None  Radiology DG Hip [place] or [place] Pelvis 1 View Right  Result Date: 04/01/2019 CLINICAL DATA:  Post reduction of a right hip prosthesis dislocation EXAM: DG HIP (WITH OR WITHOUT PELVIS) 1V PORT RIGHT COMPARISON:  Radiograph 04/01/2019 FINDINGS: Articulating femoral component of the right hip prosthesis now appears normally seated within the screw fixed acetabular component. Associated swelling remains. Soft tissue mineralization superolateral to the acetabulum is unchanged from prior. No clear periprosthetic fracture or other complication is seen. IMPRESSION: Successful relocation of the right hip arthroplasty. Electronically Signed   By: Kreg Shropshire M.D.   On: 04/01/2019 22:02   DG Hip Unilat W or Wo Pelvis 2-3 Views  Right  Result Date: 04/01/2019 CLINICAL DATA:  Right hip pain. EXAM: DG HIP (WITH OR WITHOUT PELVIS) 2-3V RIGHT COMPARISON:  March 13, 2019 FINDINGS: A total right hip replacement is seen with superior dislocation of the right femoral head prosthesis. No acute fracture is identified. Soft tissue structures are unremarkable. IMPRESSION: 1. Superiorly displaced total right hip replacement. Electronically Signed   By: Aram Candela M.D.   On: 04/01/2019 20:33    Procedures Reduction of dislocation  Date/Time: 04/01/2019 11:59 PM Performed by: Maxwell Caul, PA-C Authorized by: Maxwell Caul, PA-C  Consent: Verbal consent obtained. Consent given by: patient Patient understanding: patient states understanding of the procedure being performed Patient consent: the patient's understanding of the procedure matches consent given Procedure consent: procedure consent matches procedure scheduled Relevant documents: relevant documents present and verified Test results: test results available and properly labeled Site marked: the operative site was marked Imaging studies: imaging studies available Required items: required blood products, implants, devices, and special equipment available Patient identity confirmed: verbally with patient Time out: Immediately prior to procedure a "time out" was called to verify the correct patient, procedure, equipment, support staff and site/side marked as required. Patient tolerance: patient tolerated the procedure well with no immediate complications Comments: Please see separate sedation note by ED attending.    (including critical care time)  Medications Ordered in ED Medications  propofol (DIPRIVAN) 10 mg/mL bolus/IV push 55 mg (55 mg Intravenous See Procedure Record 04/01/19 2152)  morphine 2 MG/ML injection 2 mg (2 mg Intravenous Given 04/01/19 2050)  ondansetron (ZOFRAN) injection 4 mg (4 mg Intravenous Given 04/01/19 2050)  sodium chloride 0.9 %  bolus 1,000 mL (0 mLs Intravenous Stopped 04/01/19 2153)  propofol (DIPRIVAN) 10 mg/mL bolus/IV push ( Intravenous Canceled Entry 04/01/19 2145)  propofol (DIPRIVAN) 10 mg/mL bolus/IV push (50 mg Intravenous Given 04/01/19 2125)  propofol (DIPRIVAN) 10 mg/mL bolus/IV push (40 mg Intravenous Given 04/01/19 2126)  propofol (DIPRIVAN) 10 mg/mL bolus/IV push (40 mg Intravenous Given 04/01/19 2127)  propofol (DIPRIVAN) 10 mg/mL bolus/IV push (40 mg Intravenous Given 04/01/19 2131)  propofol (DIPRIVAN) 10 mg/mL bolus/IV push (40 mg Intravenous Given 04/01/19 2133)  propofol (DIPRIVAN) 10 mg/mL bolus/IV push (40 mg Intravenous Given 04/01/19 2135)  ED Course  I have reviewed the triage vital signs and the nursing notes.  Pertinent labs & imaging results that were available during my care of the patient were reviewed by me and considered in my medical decision making (see chart for details).    MDM Rules/Calculators/A&P                      49 year old male who presents for evaluation of right lower extremity pain. 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 States that about 7 PM, he was sitting down on the couch when it dislocated. On initial arrival, he is afebrile, nontoxic-appearing. Vital signs are stable. He is neurovascularly intact. Right lower extremity is shortened and rotated. Concern for fracture versus dislocation. Plan to check x-ray.  EMS did state that patient seemed more jittery.  I discussed with patient he denies any alcohol use, drug use. He states he has not slept in 48 hours.  He is alert and oriented is not complaining of any other concerns.  XR shows superiorly displaced total right hip replacement.   We will plan to reduce patient's hip here in the ED.  Patient placed in the immobilizer after reduction.  Postreduction x-rays show successful reduction of right hip.  Patient is alert and awake and he is ambulating in the ED without any difficulty.  I discussed  with patient that he needs to follow-up with his orthopedic doctor for evaluation given his repeat dislocations.  Patient is ready to go home. At this time, patient exhibits no emergent life-threatening condition that require further evaluation in ED or admission. Patient had ample opportunity for questions and discussion. All patient's questions were answered with full understanding. Strict return precautions discussed. Patient expresses understanding and agreement to plan.   Portions of this note were generated with Scientist, clinical (histocompatibility and immunogenetics). Dictation errors may occur despite best attempts at proofreading.   Final Clinical Impression(s) / ED Diagnoses Final diagnoses:  Dislocation of right hip, initial encounter [facility])    Rx / DC Orders ED Discharge Orders    None       Maxwell Caul, PA-C 04/02/19 0001    Rolan Bucco, MD 04/02/19 1523

## 2019-04-01 NOTE — Discharge Instructions (Signed)
Use knee immobilizer as directed until you are able to see orthopedics.  Call your orthopedic doctor in for follow-up.  Return emergency department for worsening pain, redness or swelling or any other worsening concerning symptoms.

## 2019-04-01 NOTE — Sedation Documentation (Signed)
Dr. Freida Busman in to assist back in place. Knee immobilizer placed by Ortho.

## 2019-04-01 NOTE — ED Notes (Signed)
Patient transported to X-ray 

## 2019-04-01 NOTE — ED Triage Notes (Signed)
Pt presents to ED via [facility] from home. Pt reports he was getting ready for to lay down in bed when he felt his hip dislocate again. This is a chronic and recurring issue for this pt and he has been seen multiple times for same. EMS initiated 22 G L hand and administered 250 mcg of fentanyl. Pt does appear to be more jittery and lethargic than usually, EMS made the same assessment. EDAPP made aware of same.

## 2019-04-01 NOTE — ED Notes (Signed)
Pt continues to be very fidgety, writhing around and speaking randomly and is difficult to understand at times, and is still c/o pain even after after pain medication was given.

## 2019-04-02 NOTE — ED Provider Notes (Signed)
.  Sedation  Date/Time: 04/01/2019 9:00 PM Performed by: Rolan Bucco, MD Authorized by: Rolan Bucco, MD   Consent:    Consent obtained:  Verbal   Consent given by:  Patient   Risks discussed:  Allergic reaction, dysrhythmia, inadequate sedation, nausea, prolonged hypoxia resulting in organ damage, prolonged sedation necessitating reversal, respiratory compromise necessitating ventilatory assistance and intubation and vomiting   Alternatives discussed:  Analgesia without sedation, anxiolysis and regional anesthesia Universal protocol:    Procedure explained and questions answered to patient or proxy's satisfaction: yes     Relevant documents present and verified: yes     Test results available and properly labeled: yes     Imaging studies available: yes     Required blood products, implants, devices, and special equipment available: yes     Site/side marked: yes     Immediately prior to procedure a time out was called: yes     Patient identity confirmation method:  Verbally with patient Indications:    Procedure necessitating sedation performed by:  Different physician Pre-sedation assessment:    Time since last food or drink:  12   ASA classification: class 2 - patient with mild systemic disease     Neck mobility: normal     Mouth opening:  3 or more finger widths   Thyromental distance:  4 finger widths   Mallampati score:  I - soft palate, uvula, fauces, pillars visible   Pre-sedation assessments completed and reviewed: airway patency, cardiovascular function, hydration status, mental status, nausea/vomiting, pain level, respiratory function and temperature     Pre-sedation assessment completed:  04/01/2019 9:00 PM Immediate pre-procedure details:    Reassessment: Patient reassessed immediately prior to procedure     Reviewed: vital signs, relevant labs/tests and NPO status     Verified: bag valve mask available, emergency equipment available, intubation equipment available, IV  patency confirmed, oxygen available and suction available   Procedure details (see MAR for exact dosages):    Preoxygenation:  Nasal cannula   Sedation:  Propofol   Intended level of sedation: deep   Intra-procedure monitoring:  Blood pressure monitoring, cardiac monitor, continuous pulse oximetry, frequent LOC assessments, frequent vital sign checks and continuous capnometry   Intra-procedure events: none     Total Provider sedation time (minutes):  18 Post-procedure details:    Post-sedation assessment completed:  04/01/2019 10:30 PM   Attendance: Constant attendance by certified staff until patient recovered     Recovery: Patient returned to pre-procedure baseline     Post-sedation assessments completed and reviewed: airway patency, cardiovascular function, hydration status, mental status, nausea/vomiting, pain level, respiratory function and temperature     Patient is stable for discharge or admission: yes     Patient tolerance:  Tolerated well, no immediate complications     Rolan Bucco, MD 04/02/19 1528

## 2019-04-18 ENCOUNTER — Encounter (HOSPITAL_COMMUNITY): Payer: Self-pay

## 2019-04-18 ENCOUNTER — Other Ambulatory Visit: Payer: Self-pay

## 2019-04-18 ENCOUNTER — Emergency Department (HOSPITAL_COMMUNITY)
Admission: EM | Admit: 2019-04-18 | Discharge: 2019-04-18 | Disposition: A | Discharge status: Home / Self Care | Payer: Medicare HMO | Attending: Emergency Medicine | Admitting: Emergency Medicine

## 2019-04-18 ENCOUNTER — Emergency Department (HOSPITAL_COMMUNITY): Payer: Medicare HMO

## 2019-04-18 DIAGNOSIS — T84020A Dislocation of internal right hip prosthesis, initial encounter: Secondary | ICD-10-CM | POA: Insufficient documentation

## 2019-04-18 DIAGNOSIS — Y792 Prosthetic and other implants, materials and accessory orthopedic devices associated with adverse incidents: Secondary | ICD-10-CM | POA: Insufficient documentation

## 2019-04-18 DIAGNOSIS — X500XXA Overexertion from strenuous movement or load, initial encounter: Secondary | ICD-10-CM | POA: Diagnosis not present

## 2019-04-18 DIAGNOSIS — I10 Essential (primary) hypertension: Secondary | ICD-10-CM | POA: Insufficient documentation

## 2019-04-18 DIAGNOSIS — Y999 Unspecified external cause status: Secondary | ICD-10-CM | POA: Insufficient documentation

## 2019-04-18 DIAGNOSIS — Y92009 Unspecified place in unspecified non-institutional (private) residence as the place of occurrence of the external cause: Secondary | ICD-10-CM | POA: Diagnosis not present

## 2019-04-18 DIAGNOSIS — Y9389 Activity, other specified: Secondary | ICD-10-CM | POA: Diagnosis not present

## 2019-04-18 DIAGNOSIS — S73004A Unspecified dislocation of right hip, initial encounter: Secondary | ICD-10-CM

## 2019-04-18 DIAGNOSIS — Z79899 Other long term (current) drug therapy: Secondary | ICD-10-CM | POA: Diagnosis not present

## 2019-04-18 DIAGNOSIS — S70911A Unspecified superficial injury of right hip, initial encounter: Secondary | ICD-10-CM | POA: Diagnosis present

## 2019-04-18 MED ORDER — KETAMINE HCL 50 MG/5ML IJ SOSY
0.5000 mg/kg | PREFILLED_SYRINGE | Freq: Once | INTRAMUSCULAR | Status: DC
  Filled 2019-04-18: qty 5

## 2019-04-18 MED ORDER — PROPOFOL 10 MG/ML IV BOLUS
0.5000 mg/kg | Freq: Once | INTRAVENOUS | Status: DC
  Filled 2019-04-18: qty 20

## 2019-04-18 MED ORDER — PROPOFOL 10 MG/ML IV BOLUS
INTRAVENOUS | Status: AC | PRN
  Administered 2019-04-18 (×2): 40 mg via INTRAVENOUS

## 2019-04-18 MED ORDER — KETAMINE HCL 10 MG/ML IJ SOLN
INTRAMUSCULAR | Status: AC | PRN
  Administered 2019-04-18: 40 mg via INTRAVENOUS

## 2019-04-18 NOTE — ED Notes (Addendum)
Patient took all of his monitoring devices off. Unable to obtain vital signs. Patient is standing in room attempting to leave. Per PA, patient is okay to leave at this time. Patient went to the restroom and never returned to room. Unable to give discharge paperwork/instructions and unable to obtain signature.

## 2019-04-18 NOTE — Sedation Documentation (Signed)
Time out

## 2019-04-18 NOTE — ED Provider Notes (Signed)
Physical Exam  BP (!) 149/104 (BP Location: Right Arm)   Pulse 89   Temp 97.8 F (36.6 C) (Oral)   Resp 18   Ht 6\' 2"  (1.88 m)   Wt 81.6 kg   SpO2 99%   BMI 23.11 kg/m   Physical Exam HENT:     Head: Normocephalic and atraumatic.  Eyes:     Extraocular Movements: Extraocular movements intact.     Conjunctiva/sclera: Conjunctivae normal.  Cardiovascular:     Rate and Rhythm: Tachycardia present.  Pulmonary:     Effort: No respiratory distress.     Breath sounds: No stridor.  Abdominal:     General: Abdomen is flat. Bowel sounds are normal.  Musculoskeletal:        General: Deformity (right leg shortened and rotated) present.  Skin:    General: Skin is warm.  Neurological:     General: No focal deficit present.     ED Course/Procedures     .Sedation  Date/Time: 04/18/2019 5:09 AM Performed by: Merrily Pew, MD Authorized by: Merrily Pew, MD   Consent:    Consent obtained:  Verbal   Consent given by:  Patient   Risks discussed:  Allergic reaction, dysrhythmia, inadequate sedation, nausea, prolonged hypoxia resulting in organ damage, prolonged sedation necessitating reversal, respiratory compromise necessitating ventilatory assistance and intubation and vomiting   Alternatives discussed:  Analgesia without sedation, anxiolysis and regional anesthesia Universal protocol:    Procedure explained and questions answered to patient or proxy's satisfaction: yes     Relevant documents present and verified: yes     Test results available and properly labeled: yes     Imaging studies available: yes     Required blood products, implants, devices, and special equipment available: yes     Site/side marked: yes     Immediately prior to procedure a time out was called: yes     Patient identity confirmation method:  Verbally with patient Indications:    Procedure necessitating sedation performed by:  Physician performing sedation Pre-sedation assessment:    Time since last  food or drink:  2 hours   ASA classification: class 1 - normal, healthy patient     Neck mobility: normal     Mouth opening:  3 or more finger widths   Thyromental distance:  4 finger widths   Mallampati score:  I - soft palate, uvula, fauces, pillars visible   Pre-sedation assessments completed and reviewed: airway patency, cardiovascular function, hydration status, mental status, nausea/vomiting, pain level, respiratory function and temperature   Immediate pre-procedure details:    Reassessment: Patient reassessed immediately prior to procedure     Reviewed: vital signs, relevant labs/tests and NPO status     Verified: bag valve mask available, emergency equipment available, intubation equipment available, IV patency confirmed, oxygen available and suction available   Procedure details (see MAR for exact dosages):    Preoxygenation:  Nasal cannula   Sedation:  Propofol and ketamine   Intended level of sedation: deep   Intra-procedure monitoring:  Blood pressure monitoring, cardiac monitor, continuous pulse oximetry, frequent LOC assessments, frequent vital sign checks and continuous capnometry   Intra-procedure events: none     Total Provider sedation time (minutes):  19 Post-procedure details:    Attendance: Constant attendance by certified staff until patient recovered     Recovery: Patient returned to pre-procedure baseline     Post-sedation assessments completed and reviewed: airway patency, cardiovascular function, hydration status, mental status, nausea/vomiting, pain level, respiratory function  and temperature     Patient is stable for discharge or admission: yes     Patient tolerance:  Tolerated well, no immediate complications   MDM   Sedated by myself without complication, reduced by abigail harris without complication. Already has been working on scheduling revision with his orthopedist.        Marily Memos, MD 04/18/19 [phone]

## 2019-04-18 NOTE — Sedation Documentation (Signed)
Right hip in place.

## 2019-04-18 NOTE — ED Provider Notes (Addendum)
[facility]-EMERGENCY DEPT Provider Note   CSN: [id number] Arrival date & time: 04/18/19  0145     History Chief Complaint  Patient presents with  . Hip Pain    R    Todd [name] is a 49 y.o. male with a past medical history of recurrent prosthetic hip dislocations who presents with chief complaint of hip dislocation.  Patient states that he was leaning over his dog's kennel trying to get him in when he felt his Right hip pop out of place.  He had immediate severe pain.  Patient states that he drove himself to the hospital using his left leg because he "did not want to pay that $6000 ambulance fee."  Patient states he is scheduled for revision in the near future.  He denies any numbness or tingling  HPI     Past Medical History:  Diagnosis Date  . Drug addiction (HCC)   . GERD (gastroesophageal reflux disease)   . H/O hiatal hernia   . Hypertension     Patient Active Problem List   Diagnosis Date Noted  . Hypoxia 10/07/2015  . Mechanical complication of internal orthopedic device (HCC) 03/16/2015  . Hip dislocation, right (HCC) 02/18/2015  . Anemia 07/16/2014  . Altered mental status 10/20/2013  . Dislocation of hip prosthesis (HCC) 10/20/2013  . CAP (community acquired pneumonia) 10/20/2013  . Acute GI bleeding 09/19/2013  . Acute blood loss anemia 09/19/2013  . Essential hypertension 09/19/2013  . Sinus tachycardia 09/19/2013  . GASTROESOPHAGEAL REFLUX, NO ESOPHAGITIS 05/06/2006    Past Surgical History:  Procedure Laterality Date  . APPENDECTOMY     as child  . COLONOSCOPY WITH PROPOFOL  02/17/2012   Procedure: COLONOSCOPY WITH PROPOFOL;  Surgeon: Willis Modena, MD;  Location: [facility] ENDOSCOPY;  Service: Endoscopy;  Laterality: N/A;  . ESOPHAGOGASTRODUODENOSCOPY Left 09/19/2013   Procedure: ESOPHAGOGASTRODUODENOSCOPY (EGD);  Surgeon: Willis Modena, MD;  Location[facility] ENDOSCOPY;  Service: Endoscopy;  Laterality: Left;  .  ESOPHAGOGASTRODUODENOSCOPY (EGD) WITH PROPOFOL  02/17/2012   Procedure: ESOPHAGOGASTRODUODENOSCOPY (EGD) WITH PROPOFOL;  Surgeon: Willis Modena, MD;  Location: [facility] ENDOSCOPY;  Service: Endoscopy;  Laterality: N/A;  . HIP CLOSED REDUCTION Right 10/20/2013   Procedure: CLOSED REDUCTION HIP;  Surgeon: Eldred Manges, MD;  Location: [facility] OR;  Service: Orthopedics;  Laterality: Right;  . HIP CLOSED REDUCTION Right 02/18/2015   Procedure: CLOSED REDUCTION HIP;  Surgeon: Jodi Geralds, MD;  Location: [facility] ORS;  Service: Orthopedics;  Laterality: Right;  . HIP CLOSED REDUCTION Right 03/16/2015   Procedure: CLOSED REDUCTION HIP;  Surgeon: Kathryne Hitch, MD;  Location: [facility] OR;  Service: Orthopedics;  Laterality: Right;  . JOINT REPLACEMENT  2005   Right Hip       Family History  Problem Relation Age of Onset  . Diabetes Mother     Social History   Tobacco Use  . Smoking status: Never Smoker  . Smokeless tobacco: Never Used  Substance Use Topics  . Alcohol use: No  . Drug use: No    Types: IV    Home Medications Prior to Admission medications   Medication Sig Start Date End Date Taking? Authorizing Provider  clotrimazole (LOTRIMIN) 1 % cream Apply 1 application topically 3 (three) times daily.    [provider]  FLUoxetine (PROZAC) 40 MG capsule Take 40 mg by mouth daily.    [provider]  ibuprofen (ADVIL,MOTRIN) 800 MG tablet Take 1 tablet (800 mg total) by mouth 3 (three) times daily. Patient  not taking: Reported on 01/09/2019 07/11/16   Noemi Chapel, MD  loratadine (CLARITIN) 10 MG tablet Take 10 mg by mouth daily as needed for allergies.     [provider]  metoprolol (LOPRESSOR) 50 MG tablet Take 50 mg by mouth daily.     [provider]  Multiple Vitamin (MULTIVITAMIN) tablet Take 1 tablet by mouth daily.    [provider]  omeprazole (PRILOSEC) 40 MG capsule Take 40 mg by mouth daily. 09/04/18   [provider]  ondansetron  (ZOFRAN) 4 MG tablet Take 4 mg by mouth every 8 (eight) hours as needed for nausea or vomiting.  11/26/18   [provider]  oxyCODONE (ROXICODONE) 15 MG immediate release tablet Take 15 mg by mouth 4 (four) times daily as needed for pain. 01/13/19   [provider]  OXYCONTIN 20 MG 12 hr tablet Take 20 mg by mouth 2 (two) times daily. 06/19/16   [provider]  testosterone cypionate (DEPOTESTOSTERONE CYPIONATE) 200 MG/ML injection Inject 200 mg into the muscle every 14 (fourteen) days.  11/11/18   [provider]  tiZANidine (ZANAFLEX) 4 MG tablet Take 4 mg by mouth 3 (three) times daily.  11/25/14   [provider]    Allergies    Ambien [zolpidem tartrate], Nu-iron [polysaccharide iron complex], Fentanyl, and Clonidine derivatives  Review of Systems   Review of Systems Ten systems reviewed and are negative for acute change, except as noted in the HPI.  Physical Exam Updated Vital Signs BP (!) 149/104 (BP Location: Right Arm)   Pulse 89   Temp 97.8 F (36.6 C) (Oral)   Resp 18   Ht 6\' 2"  (1.88 m)   Wt 81.6 kg   SpO2 99%   BMI 23.11 kg/m   Physical Exam Physical Exam  Nursing note and vitals reviewed. Constitutional: He appears well-developed and well-nourished. No distress.  HENT:  Head: Normocephalic and atraumatic.  Eyes: Conjunctivae normal are normal. No scleral icterus.  Neck: Normal range of motion. Neck supple.  Cardiovascular: Normal rate, regular rhythm and normal heart sounds.   Pulmonary/Chest: Effort normal and breath sounds normal. No respiratory distress.  Abdominal: Soft. There is no tenderness.  Musculoskeletal: R leg shortened and internally rotated. Normal DP pulse  Neurological: He is alert.  Skin: Skin is warm and dry. He is not diaphoretic.  Psychiatric: His behavior is normal.    ED Results / Procedures / Treatments   Labs (all labs ordered are listed, but only abnormal results are displayed) Labs Reviewed  - No data to display  EKG None  Radiology No results found.  Procedures Reduction of dislocation  Date/Time: 04/18/2019 3:55 AM Performed by: Margarita Mail, PA-C Authorized by: Margarita Mail, PA-C  Consent: Verbal consent obtained. Consent given by: patient Patient understanding: patient states understanding of the procedure being performed Patient consent: the patient's understanding of the procedure matches consent given Procedure consent: procedure consent matches procedure scheduled Relevant documents: relevant documents present and verified Patient identity confirmed: verbally with patient and hospital-assigned identification number Time out: Immediately prior to procedure a "time out" was called to verify the correct patient, procedure, equipment, support staff and site/side marked as required. Preparation: Patient was prepped and draped in the usual sterile fashion. Local anesthesia used: no  Anesthesia: Local anesthesia used: no  Sedation: Patient sedated: yes (see sedation note by Dr. Merrily Pew) Sedatives: ketamine and propofol  Patient tolerance: patient tolerated the procedure well with no immediate complications    (  including critical care time)  Medications Ordered in ED Medications - No data to display  ED Course  I have reviewed the triage vital signs and the nursing notes.  Pertinent labs & imaging results that were available during my care of the patient were reviewed by me and considered in my medical decision making (see chart for details).    MDM Rules/Calculators/A&P                       Patient with R hip dislocation. Successfully reduced. Patient refused Knee immobilizer because he " has a hundred" at home. He understands that this puts him at greater risk of repeat dislocation. I personally reviewed the pre and post reduction films which shows a dislocation and successful reduction on my interpretation. The patient appears appropriate  for discharge  Final Clinical Impression(s) / ED Diagnoses Final diagnoses:  Dislocation of right hip, [facility])    Rx / DC Orders ED Discharge Orders    None       Arthor Captain, PA-C 04/18/19 0411    Mesner, Barbara Cower, MD 04/18/19 0510    Arthor Captain, PA-C 05/19/19 1236    Mesner, Barbara Cower, MD 05/20/19 [phone]

## 2019-04-18 NOTE — ED Triage Notes (Signed)
Pt reports R hip pain. Hx of dislocations and surgeries. Shortening noted.

## 2019-04-18 NOTE — Sedation Documentation (Signed)
Unable to obtain q 5 vital signs due to patient removing all monitoring devices. Pt stated he was leaving and he has been through this 41 times. Gait unsteady.

## 2019-04-18 NOTE — Discharge Instructions (Addendum)
Follow up with your orthopedic doctor Use your knee immobilizer.  Return for any new or worsening conditions.

## 2019-04-29 ENCOUNTER — Observation Stay (HOSPITAL_COMMUNITY)
Admission: EM | Admit: 2019-04-29 | Discharge: 2019-04-29 | Disposition: A | Discharge status: Home / Self Care | Payer: Medicare HMO | Attending: Orthopedic Surgery | Admitting: Orthopedic Surgery

## 2019-04-29 ENCOUNTER — Encounter (HOSPITAL_COMMUNITY): Payer: Self-pay

## 2019-04-29 ENCOUNTER — Encounter (HOSPITAL_COMMUNITY)
Admission: EM | Disposition: A | Discharge status: Home / Self Care | Payer: Self-pay | Source: Home / Self Care | Attending: Emergency Medicine

## 2019-04-29 ENCOUNTER — Emergency Department (HOSPITAL_COMMUNITY): Payer: Medicare HMO

## 2019-04-29 ENCOUNTER — Emergency Department (HOSPITAL_COMMUNITY): Payer: Medicare HMO | Admitting: Anesthesiology

## 2019-04-29 ENCOUNTER — Other Ambulatory Visit: Payer: Self-pay

## 2019-04-29 ENCOUNTER — Observation Stay (HOSPITAL_COMMUNITY): Payer: Medicare HMO

## 2019-04-29 DIAGNOSIS — K449 Diaphragmatic hernia without obstruction or gangrene: Secondary | ICD-10-CM | POA: Insufficient documentation

## 2019-04-29 DIAGNOSIS — Z833 Family history of diabetes mellitus: Secondary | ICD-10-CM | POA: Diagnosis not present

## 2019-04-29 DIAGNOSIS — S73004A Unspecified dislocation of right hip, initial encounter: Secondary | ICD-10-CM | POA: Insufficient documentation

## 2019-04-29 DIAGNOSIS — I10 Essential (primary) hypertension: Secondary | ICD-10-CM | POA: Insufficient documentation

## 2019-04-29 DIAGNOSIS — D649 Anemia, unspecified: Secondary | ICD-10-CM | POA: Insufficient documentation

## 2019-04-29 DIAGNOSIS — Z885 Allergy status to narcotic agent status: Secondary | ICD-10-CM | POA: Diagnosis not present

## 2019-04-29 DIAGNOSIS — Y792 Prosthetic and other implants, materials and accessory orthopedic devices associated with adverse incidents: Secondary | ICD-10-CM | POA: Diagnosis not present

## 2019-04-29 DIAGNOSIS — Z888 Allergy status to other drugs, medicaments and biological substances status: Secondary | ICD-10-CM | POA: Diagnosis not present

## 2019-04-29 DIAGNOSIS — Z791 Long term (current) use of non-steroidal anti-inflammatories (NSAID): Secondary | ICD-10-CM | POA: Diagnosis not present

## 2019-04-29 DIAGNOSIS — Z96649 Presence of unspecified artificial hip joint: Secondary | ICD-10-CM

## 2019-04-29 DIAGNOSIS — Z419 Encounter for procedure for purposes other than remedying health state, unspecified: Secondary | ICD-10-CM

## 2019-04-29 DIAGNOSIS — T84020A Dislocation of internal right hip prosthesis, initial encounter: Principal | ICD-10-CM | POA: Diagnosis present

## 2019-04-29 DIAGNOSIS — Z79899 Other long term (current) drug therapy: Secondary | ICD-10-CM | POA: Diagnosis not present

## 2019-04-29 DIAGNOSIS — Z20822 Contact with and (suspected) exposure to covid-19: Secondary | ICD-10-CM | POA: Insufficient documentation

## 2019-04-29 DIAGNOSIS — K219 Gastro-esophageal reflux disease without esophagitis: Secondary | ICD-10-CM | POA: Insufficient documentation

## 2019-04-29 DIAGNOSIS — M24459 Recurrent dislocation, unspecified hip: Secondary | ICD-10-CM | POA: Insufficient documentation

## 2019-04-29 DIAGNOSIS — T84029A Dislocation of unspecified internal joint prosthesis, initial encounter: Secondary | ICD-10-CM

## 2019-04-29 HISTORY — PX: HIP CLOSED REDUCTION: SHX983

## 2019-04-29 LAB — CBC WITH DIFFERENTIAL/PLATELET
Abs Immature Granulocytes: 0.02 10*3/uL (ref 0.00–0.07)
Basophils Absolute: 0.1 10*3/uL (ref 0.0–0.1)
Basophils Relative: 1 %
Eosinophils Absolute: 0.1 10*3/uL (ref 0.0–0.5)
Eosinophils Relative: 2 %
HCT: 39 % (ref 39.0–52.0)
Hemoglobin: 13.1 g/dL (ref 13.0–17.0)
Immature Granulocytes: 0 %
Lymphocytes Relative: 11 %
Lymphs Abs: 0.8 10*3/uL (ref 0.7–4.0)
MCH: 29.1 pg (ref 26.0–34.0)
MCHC: 33.6 g/dL (ref 30.0–36.0)
MCV: 86.7 fL (ref 80.0–100.0)
Monocytes Absolute: 1 10*3/uL (ref 0.1–1.0)
Monocytes Relative: 13 %
Neutro Abs: 5.5 10*3/uL (ref 1.7–7.7)
Neutrophils Relative %: 73 %
Platelets: 258 10*3/uL (ref 150–400)
RBC: 4.5 MIL/uL (ref 4.22–5.81)
RDW: 12.7 % (ref 11.5–15.5)
WBC: 7.6 10*3/uL (ref 4.0–10.5)
nRBC: 0 % (ref 0.0–0.2)

## 2019-04-29 LAB — BASIC METABOLIC PANEL
Anion gap: 11 (ref 5–15)
BUN: 12 mg/dL (ref 6–20)
CO2: 25 mmol/L (ref 22–32)
Calcium: 8.4 mg/dL — ABNORMAL LOW (ref 8.9–10.3)
Chloride: 103 mmol/L (ref 98–111)
Creatinine, Ser: 0.92 mg/dL (ref 0.61–1.24)
GFR calc Af Amer: >60 mL/min (ref >60)
GFR calc non Af Amer: >60 mL/min (ref >60)
Glucose, Bld: 147 mg/dL — ABNORMAL HIGH (ref 70–99)
Potassium: 3.9 mmol/L (ref 3.5–5.1)
Sodium: 139 mmol/L (ref 135–145)

## 2019-04-29 LAB — RESPIRATORY PANEL BY RT PCR (FLU A&B, COVID)
Influenza A by PCR: NEGATIVE
Influenza B by PCR: NEGATIVE
SARS Coronavirus 2 by RT PCR: NEGATIVE

## 2019-04-29 SURGERY — CLOSED MANIPULATION, JOINT, HIP
Anesthesia: General | Site: Hip | Laterality: Right

## 2019-04-29 MED ORDER — EPHEDRINE 5 MG/ML INJ
INTRAVENOUS | Status: AC
  Filled 2019-04-29: qty 10

## 2019-04-29 MED ORDER — ONDANSETRON HCL 4 MG/2ML IJ SOLN
INTRAMUSCULAR | Status: DC | PRN
  Administered 2019-04-29: 4 mg via INTRAVENOUS

## 2019-04-29 MED ORDER — MIDAZOLAM HCL 2 MG/2ML IJ SOLN
INTRAMUSCULAR | Status: AC
  Filled 2019-04-29: qty 2

## 2019-04-29 MED ORDER — MEPERIDINE HCL 50 MG/ML IJ SOLN: 6.2500 mg | INTRAMUSCULAR | Status: DC | PRN

## 2019-04-29 MED ORDER — DEXAMETHASONE SODIUM PHOSPHATE 10 MG/ML IJ SOLN
INTRAMUSCULAR | Status: AC
Start: 2019-04-29 — End: ?
  Filled 2019-04-29: qty 1

## 2019-04-29 MED ORDER — METHOCARBAMOL 500 MG IVPB - SIMPLE MED: 500.0000 mg | Freq: Four times a day (QID) | INTRAVENOUS | Status: DC | PRN

## 2019-04-29 MED ORDER — ETOMIDATE 2 MG/ML IV SOLN
0.1500 mg/kg | Freq: Once | INTRAVENOUS | Status: AC
  Administered 2019-04-29: 06:00:00 12.24 mg via INTRAVENOUS
  Filled 2019-04-29: qty 10

## 2019-04-29 MED ORDER — HYDROMORPHONE HCL 1 MG/ML IJ SOLN: 0.2500 mg | INTRAMUSCULAR | Status: DC | PRN

## 2019-04-29 MED ORDER — OXYCODONE HCL 5 MG/5ML PO SOLN: 5.0000 mg | Freq: Once | ORAL | Status: AC | PRN

## 2019-04-29 MED ORDER — METHOCARBAMOL 500 MG PO TABS: 500.0000 mg | ORAL_TABLET | Freq: Four times a day (QID) | ORAL | Status: DC | PRN

## 2019-04-29 MED ORDER — MORPHINE SULFATE (PF) 4 MG/ML IV SOLN
4.0000 mg | Freq: Once | INTRAVENOUS | Status: AC
  Administered 2019-04-29: 4 mg via INTRAVENOUS
  Filled 2019-04-29: qty 1

## 2019-04-29 MED ORDER — PROPOFOL 10 MG/ML IV BOLUS
INTRAVENOUS | Status: DC | PRN
  Administered 2019-04-29: 200 mg via INTRAVENOUS

## 2019-04-29 MED ORDER — LIDOCAINE 2% (20 MG/ML) 5 ML SYRINGE
INTRAMUSCULAR | Status: DC | PRN
  Administered 2019-04-29: 80 mg via INTRAVENOUS

## 2019-04-29 MED ORDER — LACTATED RINGERS IV SOLN: INTRAVENOUS | Status: DC | PRN

## 2019-04-29 MED ORDER — PROMETHAZINE HCL 25 MG/ML IJ SOLN: 6.2500 mg | INTRAMUSCULAR | Status: DC | PRN

## 2019-04-29 MED ORDER — HYDROMORPHONE HCL 1 MG/ML IJ SOLN
INTRAMUSCULAR | Status: AC
  Filled 2019-04-29: qty 1

## 2019-04-29 MED ORDER — OXYCODONE HCL 5 MG PO TABS
ORAL_TABLET | ORAL | Status: AC
  Administered 2019-04-29: 11:00:00 5 mg via ORAL
  Filled 2019-04-29: qty 1

## 2019-04-29 MED ORDER — FENTANYL CITRATE (PF) 100 MCG/2ML IJ SOLN
INTRAMUSCULAR | Status: DC | PRN
  Administered 2019-04-29 (×2): 50 ug via INTRAVENOUS

## 2019-04-29 MED ORDER — MIDAZOLAM HCL 5 MG/5ML IJ SOLN
INTRAMUSCULAR | Status: DC | PRN
  Administered 2019-04-29: 2 mg via INTRAVENOUS

## 2019-04-29 MED ORDER — PROPOFOL 10 MG/ML IV BOLUS
INTRAVENOUS | Status: AC
  Filled 2019-04-29: qty 40

## 2019-04-29 MED ORDER — OXYCODONE HCL 5 MG PO TABS: 5.0000 mg | ORAL_TABLET | Freq: Once | ORAL | Status: AC | PRN

## 2019-04-29 MED ORDER — FENTANYL CITRATE (PF) 100 MCG/2ML IJ SOLN
INTRAMUSCULAR | Status: AC
  Filled 2019-04-29: qty 2

## 2019-04-29 MED ORDER — ETOMIDATE 2 MG/ML IV SOLN
INTRAVENOUS | Status: AC
  Administered 2019-04-29: 20 mg
  Filled 2019-04-29: qty 10

## 2019-04-29 MED ORDER — OXYCODONE HCL 5 MG PO TABS
ORAL_TABLET | ORAL | Status: AC
  Filled 2019-04-29: qty 1

## 2019-04-29 MED ORDER — ETOMIDATE 2 MG/ML IV SOLN
INTRAVENOUS | Status: AC | PRN
  Administered 2019-04-29: 7.5 mg via INTRAVENOUS
  Administered 2019-04-29: 12.5 mg via INTRAVENOUS
  Administered 2019-04-29: 7.5 mg via INTRAVENOUS

## 2019-04-29 SURGICAL SUPPLY — 2 items
IMMOBILIZER KNEE 20 (SOFTGOODS) ×2
IMMOBILIZER KNEE 20 THIGH 36 (SOFTGOODS) ×1 IMPLANT

## 2019-04-29 NOTE — ED Triage Notes (Signed)
Per EMS, patient from home c/o fall from bed resulting in his right hip dislocating. Patient has a hx of dislocations and surgeries. Patient called EMS earlier tonight, but upon their arrival patient refused to leave because they would not give him narcotics. Patient a&ox4. Upon arriving to the ED, patient began to yell that he needs water. Patient attempted to enter another patient's room and the nurses' station, while hopping on one leg. Security called for assistance.

## 2019-04-29 NOTE — ED Notes (Signed)
Patient given a total of 40mg  of Etomidate.

## 2019-04-29 NOTE — ED Notes (Signed)
Patient educated about being NPO at this time in case he needs surgery, however pt is still continuously attempting to drink water from sink.

## 2019-04-29 NOTE — ED Notes (Signed)
Pt keeps calling out asking for water and or ice. Pt educated on his NPO status however pt states he will just be "thrown to the wolves to figure out something himself." Pt educated on why we need him to stay NPO at this time. Pt continues to be noncompliant.

## 2019-04-29 NOTE — ED Notes (Signed)
Patient back to baseline before sedation

## 2019-04-29 NOTE — Op Note (Signed)
04/29/2019  10:35 AM  PATIENT:  Todd [name]    PRE-OPERATIVE DIAGNOSIS: Right prosthetic hip dislocation  POST-OPERATIVE DIAGNOSIS:  Same  PROCEDURE: Closed reduction under anesthesia right prosthetic hip dislocation  SURGEON:  Eulas Post, MD  PHYSICIAN ASSISTANT: Janine Ores, PA-C  ANESTHESIA:   General  PREOPERATIVE INDICATIONS:  [name] is a  49 y.o. male who originally had a total hip replacement with a metal-on-metal prosthesis, subsequently had revision and conversion to a polyethylene implant, who has had recurrent dislocations, a total of approximately 43 at this point.  His primary orthopedic surgeon is in [place].  Close reduction was attempted by the emergency room staff twice overnight, and was unsuccessful, and he is elected for close reduction under general anesthesia in the operating room.  The risks benefits and alternatives were discussed with the patient preoperatively including but not limited to the risks of infection, bleeding, nerve injury, cardiopulmonary complications, the need for revision surgery, among others, and the patient was willing to proceed.  He is likely to need conversion to a constrained prosthesis, and I will defer to his primary orthopedic surgeon regarding definitive management.  ESTIMATED BLOOD LOSS: None  OPERATIVE IMPLANTS: None  OPERATIVE FINDINGS: The hip was grossly unstable and was actually slightly difficult to reduce.  It took multiple different reduction maneuvers, but once I was able to get his hip manipulated, it reduced and was stable at 0 to 80 degrees of hip flexion.  OPERATIVE PROCEDURE: The patient was brought to the operating room and placed in the supine position.  General anesthesia was administered.  Timeout performed.  Initially I tried reducing the hip in the lateral position, but that was not successful.  I then manipulated it in the supine position, and with rotation was ultimately able to get the hip reduced.   Each time it was not reduced, the hip remained with the leg in flexion and internal rotation consistent with a posterior dislocation.  Ultimately his leg lengths were restored, and I confirmed the position on C arm, and a knee immobilizer was placed, and he was awakened and returned to the PACU in stable and satisfactory condition.  There were no complications and he tolerated the procedure well.  He will plan to continue with his standard baseline narcotic regimen managed by his pain doctor, and will follow up with his primary orthopedist next week.

## 2019-04-29 NOTE — ED Provider Notes (Signed)
[facility] DEPT Provider Note   CSN: [id number] Arrival date & time: 04/29/19  7062     History Chief Complaint  Patient presents with  . Hip Injury    Todd [name] is a 49 y.o. male.  Patient to ED with recurrent hip dislocation. He has a right prosthetic hip with >40 dislocations since last revision in 2018. He is followed by Dr. Starlyn Skeans at [facility] and is pending another revision but he has court dates that are delaying his ortho follow up. Tonight he was bending over to plug in his phone when the hip popped out again. No other injury.   The history is provided by the patient. No language interpreter was used.       Past Medical History:  Diagnosis Date  . Drug addiction ([place])   . GERD (gastroesophageal reflux disease)   . H/O hiatal hernia   . Hypertension     Patient Active Problem List   Diagnosis Date Noted  . Hypoxia 10/07/2015  . Mechanical complication of internal orthopedic device ([place]) 03/16/2015  . Hip dislocation, right ([place]) 02/18/2015  . Anemia 07/16/2014  . Altered mental status 10/20/2013  . Dislocation of hip prosthesis ([facility]) 10/20/2013  . CAP (community acquired pneumonia) 10/20/2013  . Acute GI bleeding 09/19/2013  . Acute blood loss anemia 09/19/2013  . Essential hypertension 09/19/2013  . Sinus tachycardia 09/19/2013  . GASTROESOPHAGEAL REFLUX, NO ESOPHAGITIS 05/06/2006    Past Surgical History:  Procedure Laterality Date  . APPENDECTOMY     as child  . COLONOSCOPY WITH PROPOFOL  02/17/2012   Procedure: COLONOSCOPY WITH PROPOFOL;  Surgeon: Arta Silence, MD;  Location: [facility] ENDOSCOPY;  Service: Endoscopy;  Laterality: N/A;  . ESOPHAGOGASTRODUODENOSCOPY Left 09/19/2013   Procedure: ESOPHAGOGASTRODUODENOSCOPY (EGD);  Surgeon: Arta Silence, MD;  Location: [facility] ENDOSCOPY;  Service: Endoscopy;  Laterality: Left;  . ESOPHAGOGASTRODUODENOSCOPY (EGD) WITH PROPOFOL  02/17/2012   Procedure: ESOPHAGOGASTRODUODENOSCOPY (EGD)  WITH PROPOFOL;  Surgeon: Arta Silence, MD;  Location: [facility] ENDOSCOPY;  Service: Endoscopy;  Laterality: N/A;  . HIP CLOSED REDUCTION Right 10/20/2013   Procedure: CLOSED REDUCTION HIP;  Surgeon: Marybelle Killings, MD;  Location: [facility];  Service: Orthopedics;  Laterality: Right;  . HIP CLOSED REDUCTION Right 02/18/2015   Procedure: CLOSED REDUCTION HIP;  Surgeon: Dorna Leitz, MD;  Location: [facility] ORS;  Service: Orthopedics;  Laterality: Right;  . HIP CLOSED REDUCTION Right 03/16/2015   Procedure: CLOSED REDUCTION HIP;  Surgeon: Mcarthur Rossetti, MD;  Location: [facility];  Service: Orthopedics;  Laterality: Right;  . JOINT REPLACEMENT  2005   Right Hip       Family History  Problem Relation Age of Onset  . Diabetes Mother     Social History   Tobacco Use  . Smoking status: Never Smoker  . Smokeless tobacco: Never Used  Substance Use Topics  . Alcohol use: No  . Drug use: No    Types: IV    Home Medications Prior to Admission medications   Medication Sig Start Date End Date Taking? Authorizing Provider  clotrimazole (LOTRIMIN) 1 % cream Apply 1 application topically 3 (three) times daily.    [provider]  FLUoxetine (PROZAC) 40 MG capsule Take 40 mg by mouth daily.    [provider]  ibuprofen (ADVIL,MOTRIN) 800 MG tablet Take 1 tablet (800 mg total) by mouth 3 (three) times daily. Patient not taking: Reported on 01/09/2019 07/11/16   Noemi Chapel, MD  loratadine (CLARITIN) 10 MG tablet Take  10 mg by mouth daily as needed for allergies.     [provider]  metoprolol (LOPRESSOR) 50 MG tablet Take 50 mg by mouth daily.     [provider]  Multiple Vitamin (MULTIVITAMIN) tablet Take 1 tablet by mouth daily.    [provider]  omeprazole (PRILOSEC) 40 MG capsule Take 40 mg by mouth daily. 09/04/18   [provider]  ondansetron (ZOFRAN) 4 MG tablet Take 4 mg by mouth every 8 (eight) hours as needed for nausea or vomiting.  11/26/18    [provider]  oxyCODONE (ROXICODONE) 15 MG immediate release tablet Take 15 mg by mouth 4 (four) times daily as needed for pain. 01/13/19   [provider]  OXYCONTIN 20 MG 12 hr tablet Take 20 mg by mouth 2 (two) times daily. 06/19/16   [provider]  testosterone cypionate (DEPOTESTOSTERONE CYPIONATE) 200 MG/ML injection Inject 200 mg into the muscle every 14 (fourteen) days.  11/11/18   [provider]  tiZANidine (ZANAFLEX) 4 MG tablet Take 4 mg by mouth 3 (three) times daily.  11/25/14   [provider]    Allergies    Ambien [zolpidem tartrate], Nu-iron [polysaccharide iron complex], Fentanyl, and Clonidine derivatives  Review of Systems   Review of Systems  Musculoskeletal:       See HPI  Skin: Negative.  Negative for color change and wound.  Neurological: Negative.  Negative for numbness.    Physical Exam Updated Vital Signs BP (!) 125/100 (BP Location: Left Arm)   Pulse 75   Temp 98.2 F (36.8 C) (Oral)   Resp 20   Ht 6\' 2"  (1.88 m)   Wt 81.6 kg   SpO2 95%   BMI 23.11 kg/m   Physical Exam Vitals and nursing note reviewed.  Constitutional:      Appearance: He is well-developed.  Pulmonary:     Effort: Pulmonary effort is normal.  Musculoskeletal:        General: Normal range of motion.     Cervical back: Normal range of motion.     Comments: Right leg shortened and internally rotated. Pulses present distally. There is a lateral hip deformity.   Skin:    General: Skin is warm and dry.  Neurological:     Mental Status: He is alert and oriented to person, place, and time.     ED Results / Procedures / Treatments   Labs (all labs ordered are listed, but only abnormal results are displayed) Labs Reviewed - No data to display  EKG None  Radiology DG Hip Unilat  With Pelvis 2-3 Views Right  Result Date: 04/29/2019 CLINICAL DATA:  History of right hip dislocation. EXAM: DG HIP (WITH OR WITHOUT PELVIS) 2-3V RIGHT  COMPARISON:  04/18/2019 FINDINGS: Recurrent dislocation of the prosthetic femoral head, similar to the previous instances. No evidence of fracture or other complicating feature. 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 Electronically Signed   By: 06/16/2019 M.D.   On: 04/29/2019 05:59    Procedures Reduction of dislocation  Date/Time: 04/29/2019 7:40 AM Performed by: 05/01/2019, PA-C Authorized by: Elpidio Anis, PA-C  Consent: Verbal consent obtained. Consent given by: patient Patient understanding: patient states understanding of the procedure being performed Imaging studies: imaging studies available Patient identity confirmed: verbally with patient and arm band Time out: Immediately prior to procedure a "time out" was called to verify the correct patient, procedure, equipment, support staff and site/side marked  as required. Local anesthesia used: no  Anesthesia: Local anesthesia used: no  Sedation: Patient sedated: yes Sedatives: etomidate  Comments: See procedural sedation note by Dr. Read Drivers  Patient lying supine. After adequate sedation, the right leg was brought to 90 degree flexion, upward traction applied with intermittent lateral movement of the joint. Multiple attempts were unsuccessful at reducing the hip joint.     (including critical care time)  Medications Ordered in ED Medications  etomidate (AMIDATE) injection 12.24 mg (has no administration in time range)    ED Course  I have reviewed the triage vital signs and the nursing notes.  Pertinent labs & imaging results that were available during my care of the patient were reviewed by me and considered in my medical decision making (see chart for details).    MDM Rules/Calculators/A&P                      Patient has a recurrent hip dislocation on right. Will undergo procedural sedation for reduction.   Reduction unsuccessful after 2 procedural sedations. Discussed   Patient with Dr. Dion Saucier who will take the patient to OR for reduction under anesthesia.   Final Clinical Impression(s) / ED Diagnoses Final diagnoses:  None   1. Right hip prosthesis dislocation  Rx / DC Orders ED Discharge Orders    None       Danne Harbor 04/29/19 9924    Paula Libra, MD 04/30/19 2218    Paula Libra, MD 04/30/19 2222

## 2019-04-29 NOTE — Anesthesia Postprocedure Evaluation (Signed)
Anesthesia Post Note  Patient: Todd [name]  Procedure(s) Performed: CLOSED MANIPULATION HIP (Right Hip)     Patient location during evaluation: PACU Anesthesia Type: General Level of consciousness: awake and alert Pain management: pain level controlled Vital Signs Assessment: post-procedure vital signs reviewed and stable Respiratory status: spontaneous breathing, nonlabored ventilation and respiratory function stable Cardiovascular status: blood pressure returned to baseline and stable Postop Assessment: no apparent nausea or vomiting Anesthetic complications: no    Last Vitals:  Vitals:   04/29/19 1115 04/29/19 1150  BP: 132/87 (!) 144/96  Pulse: 65 63  Resp: 18 19  Temp: 36.5 C 36.6 C  SpO2: 100% 100%    Last Pain:  Vitals:   04/29/19 1150  TempSrc:   PainSc: 3                  Lowella Curb

## 2019-04-29 NOTE — ED Notes (Signed)
Portable xray at bedside.

## 2019-04-29 NOTE — Discharge Summary (Signed)
Discharge Summary  Patient ID: HANAN MOEN MRN: [id number] DOB/AGE: Nov 11, 1970 49 y.o.  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 Drug addiction (HCC)   . 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 GERD (gastroesophageal reflux disease)   . H/O hiatal hernia   . Hypertension     Surgeries: Procedure(s): CLOSED MANIPULATION HIP on 04/29/2019   Consultants (if any): Treatment Team:  Teryl Lucy, MD  Discharged Condition: Improved  Hospital Course: ALFREDDIE CONSALVO is an 49 y.o. 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    He was given perioperative antibiotics:  Anti-infectives (From admission, onward)   None    .  He was given sequential compression devices and early ambulation for DVT prophylaxis.  He benefited maximally from the hospital stay and there were no complications.    Recent vital signs:  Vitals:   04/29/19 1115 04/29/19 1150  BP: 132/87 (!) 144/96  Pulse: 65 63  Resp: 18 19  Temp: 97.7 F (36.5 C) 97.9 F (36.6 C)  SpO2: 100% 100%    Recent laboratory studies:  Lab Results  Component Value Date   HGB 13.1 04/29/2019   HGB 14.2 01/30/2019   HGB 14.2 01/09/2019   Lab Results  Component Value Date   WBC 7.6 04/29/2019   PLT 258 04/29/2019   Lab Results  Component Value Date   INR 0.95 09/19/2013   Lab Results  Component Value Date   NA 139 04/29/2019   K 3.9 04/29/2019   CL 103 04/29/2019   CO2 25 04/29/2019   BUN 12 04/29/2019   CREATININE 0.92 04/29/2019   GLUCOSE 147 (H)  04/29/2019    Discharge Medications:   Allergies as of 04/29/2019      Reactions   Ambien [zolpidem Tartrate] Other (See Comments)   sedation   Nu-iron [polysaccharide Iron Complex] Diarrhea   Fentanyl Other (See Comments)   Unknown   Clonidine Derivatives Other (See Comments)   Caused Trudie Buckler syndrome      Medication List    STOP taking these medications   ibuprofen 800 MG tablet Commonly known as: ADVIL     TAKE these medications   busPIRone 10 MG tablet Commonly known as: BUSPAR Take 10 mg by mouth 3 (three) times daily.   clotrimazole 1 % cream Commonly known as: LOTRIMIN Apply 1 application topically 3 (three) times daily. elbow   dexmethylphenidate 10 MG tablet Commonly known as: FOCALIN Take 10 mg by mouth 2 (two) times daily.   FLUoxetine 40 MG capsule Commonly known as: PROZAC Take 40 mg by mouth daily.   fluticasone 50 MCG/ACT nasal spray Commonly known as: FLONASE Place 2 sprays into both nostrils daily as needed for allergies or rhinitis.   loratadine 10 MG tablet Commonly known as: CLARITIN Take 10 mg by mouth daily.   metoprolol tartrate 50 MG tablet Commonly known as: LOPRESSOR Take 50 mg by mouth daily.   multivitamin tablet Take 1 tablet by mouth daily.   ondansetron 4 MG tablet Commonly known as: ZOFRAN Take 4  mg by mouth every 8 (eight) hours as needed for nausea or vomiting.   oxyCODONE 15 MG immediate release tablet Commonly known as: ROXICODONE Take 15 mg by mouth 4 (four) times daily as needed for pain.   pantoprazole 40 MG tablet Commonly known as: PROTONIX Take 40 mg by mouth daily.   promethazine 25 MG tablet Commonly known as: PHENERGAN Take 25 mg by mouth at bedtime as needed for nausea/vomiting.   testosterone cypionate 200 MG/ML injection Commonly known as: DEPOTESTOSTERONE CYPIONATE Inject 200 mg into the muscle every 14 (fourteen) days.   tiZANidine 4 MG tablet Commonly known as: ZANAFLEX Take 4 mg by  mouth 3 (three) times daily as needed for muscle spasms.       Diagnostic Studies: DG C-Arm 1-60 Min-No Report  Result Date: 04/29/2019 Fluoroscopy was utilized by the requesting physician.  No radiographic interpretation.   DG Hip Unilat W or Wo Pelvis 1 View Right  Result Date: 04/18/2019 CLINICAL DATA:  Postreduction EXAM: DG HIP (WITH OR WITHOUT PELVIS) 1V RIGHT COMPARISON:  None. FINDINGS: Normal alignment of right total hip arthroplasty following reduction. No fracture. IMPRESSION: Normal alignment of right total hip arthroplasty following reduction. Electronically Signed   By: Ulyses Jarred M.D.   On: 04/18/2019 03:40   DG Hip Port Unilat W or Wo Pelvis 1 View Right  Result Date: 04/18/2019 CLINICAL DATA:  Hip dislocation EXAM: DG HIP (WITH OR WITHOUT PELVIS) 1V PORT RIGHT COMPARISON:  None. FINDINGS: There is superior dislocation of the right total hip arthroplasty. No periprosthetic fracture. There is approximately 3 cm of overriding. IMPRESSION: Superior dislocation of the right total hip arthroplasty. Electronically Signed   By: Ulyses Jarred M.D.   On: 04/18/2019 02:19   DG Hip Port Unilat W or Wo Pelvis 1 View Right  Result Date: 04/01/2019 CLINICAL DATA:  Post reduction of a right hip prosthesis dislocation EXAM: DG HIP (WITH OR WITHOUT PELVIS) 1V PORT RIGHT COMPARISON:  Radiograph 04/01/2019 FINDINGS: Articulating femoral component of the right hip prosthesis now appears normally seated within the screw fixed acetabular component. Associated swelling remains. Soft tissue mineralization superolateral to the acetabulum is unchanged from prior. No clear periprosthetic fracture or other complication is seen. IMPRESSION: Successful relocation of the right hip arthroplasty. Electronically Signed   By: Lovena Le M.D.   On: 04/01/2019 22:02   DG HIP OPERATIVE UNILAT WITH PELVIS RIGHT  Result Date: 04/29/2019 CLINICAL DATA:  Closed reduction of right hip dislocation EXAM: OPERATIVE  RIGHT HIP WITH PELVIS COMPARISON:  04/29/2019 right hip radiographs FLUOROSCOPY TIME:  Fluoroscopy Time:  0 minutes 1 second Radiation Exposure Index (if provided by the fluoroscopic device): 0.11 mGy Number of Acquired Spot Images: 1 FINDINGS: Spot fluoroscopic intraoperative right hip radiograph demonstrates right total hip arthroplasty with no residual malalignment in the right hip on this single frontal view. IMPRESSION: Intraoperative fluoroscopic guidance for closed reduction of right total hip arthroplasty dislocation. Electronically Signed   By: Ilona Sorrel M.D.   On: 04/29/2019 10:55   DG Hip Unilat  With Pelvis 2-3 Views Right  Result Date: 04/29/2019 CLINICAL DATA:  History of right hip dislocation. EXAM: DG HIP (WITH OR WITHOUT PELVIS) 2-3V RIGHT COMPARISON:  04/18/2019 FINDINGS: Recurrent dislocation of the prosthetic femoral head, similar to the previous instances. No evidence of fracture or other complicating feature. 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 Electronically Signed   By: Nelson Chimes M.D.   On: 04/29/2019 05:59  DG Hip Unilat W or Wo Pelvis 2-3 Views Right  Result Date: 04/01/2019 CLINICAL DATA:  Right hip pain. EXAM: DG HIP (WITH OR WITHOUT PELVIS) 2-3V RIGHT COMPARISON:  March 13, 2019 FINDINGS: A total right hip replacement is seen with superior dislocation of the right femoral head prosthesis. No acute fracture is identified. Soft tissue structures are unremarkable. IMPRESSION: 1. Superiorly displaced total right hip replacement. Electronically Signed   By: Aram Candela M.D.   On: 04/01/2019 20:33    Disposition: Discharge disposition: 01-Home or Self Care       Discharge Instructions    Discharge patient   Complete by: As directed    Discharge from PACU when meets all PACU criteria   Discharge disposition: 01-Home or Self Care   Discharge patient date: 04/29/2019      Follow-up Information    Schedule an  appointment as soon as possible for a visit with Levonne Lapping, MD.   Specialty: Student Contact information: [address] [place] [address] [phone]            Signed: Armida Sans PA-C 04/29/2019, 10:19 PM

## 2019-04-29 NOTE — Transfer of Care (Signed)
Immediate Anesthesia Transfer of Care Note  Patient: Todd [name]  Procedure(s) Performed: CLOSED MANIPULATION HIP (Right Hip)  Patient Location: PACU  Anesthesia Type:General  Level of Consciousness: awake, drowsy and patient cooperative  Airway & Oxygen Therapy: Patient Spontanous Breathing and Patient connected to face mask oxygen  Post-op Assessment: Report given to RN and Post -op Vital signs reviewed and stable  Post vital signs: Reviewed and stable  Last Vitals:  Vitals Value Taken Time  BP 114/64 04/29/19 1040  Temp 36.4 C 04/29/19 1040  Pulse 56 04/29/19 1040  Resp 13 04/29/19 1040  SpO2 100 % 04/29/19 1040    Last Pain:  Vitals:   04/29/19 0823  TempSrc:   PainSc: 10-Worst pain ever         Complications: No apparent anesthesia complications

## 2019-04-29 NOTE — H&P (Addendum)
Todd [name] is an 49 y.o. male.   Chief Complaint: Right hip pain HPI: Patient presented to ED this morning with recurrent hip dislocation. History of right hip replacement and subsequent revision in 2018. He is followed by Dr. Starlyn Skeans at [facility] and is pending another right hip revision. He was bending over across the bed from left to right to plug in his phone when he felt his right hip dislocate. Patient reports no other injuries. He states that this is his 46 rd right hip dislocation. Patient is seated comfortably in bed. He states pain is mainly located in his right buttock and is worse with movement, better with rest. Denies numbness or tingling. Denies chest pain, shortness of breath, nausea, or vomiting.  Past Medical History:  Diagnosis Date  . Drug addiction ([place])   . GERD (gastroesophageal reflux disease)   . H/O hiatal hernia   . Hypertension     Past Surgical History:  Procedure Laterality Date  . APPENDECTOMY     as child  . COLONOSCOPY WITH PROPOFOL  02/17/2012   Procedure: COLONOSCOPY WITH PROPOFOL;  Surgeon: Arta Silence, MD;  Location: [facility];  Service: Endoscopy;  Laterality: N/A;  . ESOPHAGOGASTRODUODENOSCOPY Left 09/19/2013   Procedure: ESOPHAGOGASTRODUODENOSCOPY (EGD);  Surgeon: Arta Silence, MD;  Location: [facility];  Service: Endoscopy;  Laterality: Left;  . ESOPHAGOGASTRODUODENOSCOPY (EGD) WITH PROPOFOL  02/17/2012   Procedure: ESOPHAGOGASTRODUODENOSCOPY (EGD) WITH PROPOFOL;  Surgeon: Arta Silence, MD;  Location: [facility];  Service: Endoscopy;  Laterality: N/A;  . HIP CLOSED REDUCTION Right 10/20/2013   Procedure: CLOSED REDUCTION HIP;  Surgeon: Marybelle Killings, MD;  Location: [place];  Service: Orthopedics;  Laterality: Right;  . HIP CLOSED REDUCTION Right 02/18/2015   Procedure: CLOSED REDUCTION HIP;  Surgeon: Dorna Leitz, MD;  Location: [facility];  Service: Orthopedics;  Laterality: Right;  . HIP CLOSED REDUCTION Right 03/16/2015   Procedure: CLOSED  REDUCTION HIP;  Surgeon: Mcarthur Rossetti, MD;  Location: [place];  Service: Orthopedics;  Laterality: Right;  . JOINT REPLACEMENT  2005   Right Hip    Family History  Problem Relation Age of Onset  . Diabetes Mother    Social History:  reports that he has never smoked. He has never used smokeless tobacco. He reports that he does not drink alcohol or use drugs.  Allergies:  Allergies  Allergen Reactions  . Ambien [Zolpidem Tartrate] Other (See Comments)    sedation  . Nu-Iron [Polysaccharide Iron Complex] Diarrhea  . Fentanyl Other (See Comments)    Unknown  . 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 The SARS-CoV-2 RNA is generally detectable in upper respiratoy specimens during the acute phase of infection. The lowest concentration of SARS-CoV-2 viral copies this assay can detect is 131 copies/mL. A negative result does not preclude SARS-Cov-2 infection and should not be used as the sole basis for treatment or other patient management decisions. A negative result may occur with  improper specimen collection/handling, submission  of specimen other than nasopharyngeal swab, presence of viral mutation(s) within the areas targeted by this assay, and inadequate number of viral copies (<131 copies/mL). A negative result must be combined with clinical observations, patient history, and epidemiological information. The expected result is Negative. Fact Sheet for Patients:   https://www.moore.com/ Fact Sheet for Healthcare Providers:  https://www.young.biz/ This test is not yet ap proved or cleared by the [place] FDA and  has been authorized for detection and/or diagnosis of SARS-CoV-2 by FDA under an Emergency Use Authorization (EUA). This EUA will remain  in effect (meaning this test can be used) for the duration of the COVID-19 declaration under Section 564(b)(1) of the Act, 21 U.S.C. section 360bbb-3(b)(1), unless the authorization is terminated or revoked sooner.    Influenza A by PCR NEGATIVE NEGATIVE   Influenza B by PCR NEGATIVE NEGATIVE    Comment: (NOTE) The Xpert Xpress SARS-CoV-2/FLU/RSV assay is intended as an aid in  the diagnosis of influenza from Nasopharyngeal swab specimens and  should not be used as a sole basis for treatment. Nasal washings and  aspirates are unacceptable for Xpert Xpress SARS-CoV-2/FLU/RSV  testing. Fact Sheet for Patients: https://www.moore.com/ Fact Sheet for Healthcare Providers: https://www.young.biz/ This test is not yet approved or cleared by the [place] FDA and  has been authorized for detection and/or diagnosis of SARS-CoV-2 by  FDA under an Emergency Use Authorization (EUA). This EUA will remain  in effect (meaning this test can be used) for the duration of the  Covid-19 declaration under Section 564(b)(1) of the Act, 21  U.S.C. section 360bbb-3(b)(1), unless the authorization is  terminated or revoked. Performed at [facility], [address] [place], [place] [address]   CBC with Differential     Status: None   Collection Time: 04/29/19  7:07 AM  Result Value Ref Range   WBC 7.6 4.0 - 10.5 K/uL   RBC 4.50 4.22 - 5.81 MIL/uL   Hemoglobin 13.1 13.0 - 17.0 g/dL   HCT 25.3 66.4 - 40.3 %   MCV 86.7 80.0 - 100.0 fL   MCH 29.1 26.0 - 34.0 pg   MCHC 33.6 30.0 - 36.0 g/dL   RDW 47.4 25.9 - 56.3 %    Platelets 258 150 - 400 K/uL   nRBC 0.0 0.0 - 0.2 %   Neutrophils Relative % 73 %   Neutro Abs 5.5 1.7 - 7.7 K/uL   Lymphocytes Relative 11 %   Lymphs Abs 0.8 0.7 - 4.0 K/uL   Monocytes Relative 13 %   Monocytes Absolute 1.0 0.1 - 1.0 K/uL   Eosinophils Relative 2 %   Eosinophils Absolute 0.1 0.0 - 0.5 K/uL   Basophils Relative 1 %   Basophils Absolute 0.1 0.0 - 0.1 K/uL   Immature Granulocytes 0 %   Abs Immature Granulocytes 0.02 0.00 - 0.07 K/uL    Comment: Performed at [facility], [address] [place], [place] [address]  Basic metabolic panel     Status: Abnormal   Collection Time: 04/29/19  7:07 AM  Result Value Ref Range   Sodium 139 135 - 145 mmol/L   Potassium 3.9 3.5 - 5.1 mmol/L   Chloride 103 98 - 111 mmol/L   CO2 25 22 - 32 mmol/L   Glucose, Bld 147 (H) 70 - 99 mg/dL   BUN 12 6 - 20 mg/dL   Creatinine, Ser 3.32 0.61 - 1.24 mg/dL   Calcium 8.4 (L) 8.9 - 10.3 mg/dL  GFR calc non Af Amer >60 >60 mL/min   GFR calc Af Amer >60 >60 mL/min   Anion gap 11 5 - 15    Comment: Performed at [facility], [address] [place], [place] [address]   DG Hip Unilat  With Pelvis 2-3 Views Right  Result Date: 04/29/2019 CLINICAL DATA:  History of right hip dislocation. EXAM: DG HIP (WITH OR WITHOUT PELVIS) 2-3V RIGHT COMPARISON:  04/18/2019 FINDINGS: Recurrent dislocation of the prosthetic femoral head, similar to the previous instances. No evidence of fracture or other complicating feature. 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 Electronically Signed   By: Paulina Fusi M.D.   On: 04/29/2019 05:59    Positive ROS: All other systems have been reviewed and were otherwise negative with the exception of those mentioned in the HPI and as above.  Physical Exam: Blood pressure (!) 136/96, pulse 70, temperature 98.2 F (36.8 C), temperature source Oral, resp. rate 15, height 6\' 2"  (1.88 m), weight 81.6  kg, SpO2 95 %.  General: Alert, no acute distress. Sitting in bed. Cardiovascular: No pedal edema Respiratory: No cyanosis, no use of accessory musculature GI: No organomegaly, abdomen is soft and non-tender Skin: No lesions in the area of chief complaint Neurologic: Sensation intact distally Psychiatric: Patient is competent for consent with normal mood and affect Lymphatic: No axillary or cervical lymphadenopathy  MUSCULOSKELETAL: RLE shortened and internally rotated. No TTP to right groin or right lateral thigh. TTP to right buttock. Distal sensation is intact. Full ROM of right ankle. 2+ DP pulse in right foot. Able to move all toes of right foot.    Assessment/Plan Principal Problem:   Hip dislocation, right (HCC)  Closed right hip prosthesis dislocation - Covid test confirmed negative - NPO  - Plan for closed reduction in OR with Dr. this morning - prn pain medication - plan for follow-up at [facility] with orthopedist   [facility], PA-C 04/29/2019, 8:41 AM

## 2019-04-29 NOTE — Discharge Instructions (Signed)
Closed Reduction for Prosthetic Hip Joint Dislocation, Care After This sheet gives you information about how to care for yourself after your procedure. Your health care provider may also give you more specific instructions. If you have problems or questions, contact your health care provider. What can I expect after the procedure? After the procedure, it is common to have:  Hip pain for about a week. The pain should lessen each day. It may take a few weeks to recover completely.  Difficulty walking or doing your usual daily activities. You may need to use a device to help you move around (assistive device), such as crutches, for a period of time. Follow these instructions at home: If you have a splint:  Wear the splint as told by your health care provider. Remove it only as told by your health care provider.  Loosen the splint if your toes tingle, become numb, or turn cold and blue.  Keep the splint clean.  If the splint is not waterproof: ? Do not let it get wet. ? Ask your health care provider if you may remove the splint before bathing. If you may not remove it, cover it with a watertight covering when you take a bath or a shower. If you have a cast:  Do not stick anything inside the cast to scratch your skin. Doing that raises your risk of infection.  Check the skin around the cast every day. Tell your health care provider about any concerns.  You may put lotion on dry skin around the edges of the cast. Do not put lotion on the skin underneath the cast.  Keep the cast clean.  If the cast is not waterproof: ? Do not let it get wet. ? Cover it with a watertight covering when you take a bath or a shower. Managing pain, stiffness, and swelling   If directed, put ice on the affected area: ? If you have a removable splint or brace, remove it as told by your health care provider. ? Put ice in a plastic bag. ? Place a towel between your skin and the bag or between your cast and the  bag. ? Leave the ice on for 20 minutes, 2?3 times a day.  Move your toes often to avoid stiffness and to lessen swelling. Driving  Do not drive until your health care provider approves. Ask your health care provider when it is safe for you to drive.  Do not drive or use heavy machinery while taking prescription pain medicine. Activity   Ask your health care provider what activities are safe for you. If any activity causes pain or discomfort, stop the activity. Ask for help with activities that are difficult.  Do not use your injured leg to support (bear) your body weight until your health care provider says that you can. Follow instructions about how much weight you may safely support on your affected leg (weight-bearing restrictions).  Use crutches as told by your health care provider. When you stop using your crutches, walk and move slowly until you feel more stable.  Do not lift anything that is heavier than 10 lb (4.5 kg), or the limit that you are told, until your health care provider says that it is safe.  Do exercises as told by your health care provider or physical therapist.  Follow all hip dislocation precautions in any position (standing, sitting, or lying) as told by your health care provider. These precautions may include the following: ? Do not cross your legs  at the knees. To remind yourself about this, you may keep a pillow between your legs while lying in bed. ? Do not bring your knees higher than the level of your hips. ? Do not bend at the waist more than 90 degrees (hip flexion), such as picking up something from the floor while sitting in a chair. ? Avoid twisting at your waist. To help with this, place frequently used items near the hand that you use more often (dominant side). ? Avoid rotating the toes of your affected leg inward. Keep your toes pointing straight ahead. ? Use a raised toilet seat, and avoid sitting in low chairs. General instructions  Take  over-the-counter and prescription medicines only as told by your health care provider.  If you are taking prescription pain medicine, take actions to prevent or treat constipation. Your health care provider may recommend that you: ? Drink enough fluid to keep your urine pale yellow. ? Eat foods that are high in fiber, such as fresh fruits and vegetables, whole grains, and beans. ? Limit foods that are high in fat and processed sugars, such as fried or sweet foods. ? Take an over-the-counter or prescription medicine for constipation.  Do not use any products that contain nicotine or tobacco, such as cigarettes and e-cigarettes. These can delay healing. If you need help quitting, ask your health care provider.  Keep all follow-up visits as told by your health care provider. This is important. Contact a health care provider if:  It is not getting easier for you to walk or move.  Your leg or the back of your lower leg (calf) swells or feels tender.  You have swelling that gets worse or is severe.  Any part of your hip or leg feels numb, tingles, burns, or stings.  You have pain that does not get better with medicine.  Your cast or splint is damaged or has gotten wet and is not waterproof. Get help right away if:  You think you have dislocated your hip again.  You cannot move your leg.  You have difficulty breathing.  You have chest pain. Summary  Use crutches as told by your health care provider. When you stop using your crutches, move slowly at first.  Do not use your injured leg to support your body weight until your health care provider says that you can. Follow instructions about how much weight you may safely support on your affected leg (weight-bearing restrictions).  Keep all follow-up visits as told by your health care provider. This is important. This information is not intended to replace advice given to you by your health care provider. Make sure you discuss any  questions you have with your health care provider. Document Revised: 06/15/2018 Document Reviewed: 12/23/2016 Elsevier Patient Education  2020 [employer].

## 2019-04-29 NOTE — ED Notes (Signed)
Pt arrived with EMS demanding water. When staff informed pt he needed to wait to be seen by a physician before being given water- pt jumped off the EMS stretcher and attempted to come into the nurses station to get water. Pt redirected out of the nurses station, and began walking down the hallway going into other patients room trying to get water from sinks. Pt redirected by nursing staff and security to room 18 where patient was being roomed. Pt did drink water out of the sink in the room and informed writer, "it didn't have to be like this if you just let me have water. I didn't have to make a scene. I should have just brought my water from home so I didn't have to beg for water".  Pt now in bed in room 18 placed on the monitor. Pt has call bell within reach.

## 2019-04-29 NOTE — Anesthesia Preprocedure Evaluation (Signed)
Anesthesia Evaluation  Patient identified by MRN, date of birth, ID band Patient awake    Reviewed: Allergy & Precautions, NPO status , Patient's Chart, lab work & pertinent test results, reviewed documented beta blocker date and time   Airway Mallampati: II   Neck ROM: Full    Dental  (+) Teeth Intact, Poor Dentition   Pulmonary    breath sounds clear to auscultation       Cardiovascular hypertension, Pt. on medications and Pt. on home beta blockers  Rhythm:Regular Rate:Normal     Neuro/Psych negative neurological ROS  negative psych ROS   GI/Hepatic Neg liver ROS, GERD  Medicated,  Endo/Other  negative endocrine ROS  Renal/GU negative Renal ROS  negative genitourinary   Musculoskeletal negative musculoskeletal ROS (+)   Abdominal   Peds negative pediatric ROS (+)  Hematology   Anesthesia Other Findings   Reproductive/Obstetrics negative OB ROS                             Lab Results  Component Value Date   WBC 7.6 04/29/2019   HGB 13.1 04/29/2019   HCT 39.0 04/29/2019   MCV 86.7 04/29/2019   PLT 258 04/29/2019   Lab Results  Component Value Date   CREATININE 0.92 04/29/2019   BUN 12 04/29/2019   NA 139 04/29/2019   K 3.9 04/29/2019   CL 103 04/29/2019   CO2 25 04/29/2019   EKG: normal sinus rhythm.   Anesthesia Physical  Anesthesia Plan  ASA: II  Anesthesia Plan: General   Post-op Pain Management:    Induction: Intravenous  PONV Risk Score and Plan: 2 and Ondansetron, Midazolam and Treatment may vary due to age or medical condition  Airway Management Planned: LMA  Additional Equipment:   Intra-op Plan:   Post-operative Plan: Extubation in OR  Informed Consent: I have reviewed the patients History and Physical, chart, labs and discussed the procedure including the risks, benefits and alternatives for the proposed anesthesia with the patient or authorized  representative who has indicated his/her understanding and acceptance.     Dental advisory given  Plan Discussed with: CRNA  Anesthesia Plan Comments:         Anesthesia Quick Evaluation

## 2019-04-29 NOTE — ED Notes (Signed)
Pt has gotten out of bed twice to drink water from the sink. This Clinical research associate asked pt to remain in bed due to his dislocated hip and not to drink anymore in case surgical intervention was nessecary.  Pt called this Clinical research associate a "mother f  ker"  And that I was stupid for not knowing he would not be having surgery today. He then proceeded to accuse this Clinical research associate of stealing a charging cord for some sort electronic device he has with him.  Charging cord was found with ems.

## 2019-04-29 NOTE — ED Notes (Signed)
Patient requesting ice chips, reminded of NPO status. Pateint stated that he would have to hobble to the sink for water, again re-educated on NPO status and risks for aspiration. Patient stated he has been through many times before and he knows the process and that he can have water. Patient again reminded of NPO status.

## 2019-04-29 NOTE — ED Notes (Signed)
Pt once again ambulating to sink in room to drink.

## 2019-04-29 NOTE — ED Provider Notes (Signed)
.  Sedation  Date/Time: 04/29/2019 6:12 AM Performed by: Jamarie Mussa, Shep, MD Authorized by: Belkys Henault, Tyreque, MD   Consent:    Consent obtained:  Verbal   Consent given by:  Patient   Risks discussed:  Inadequate sedation and respiratory compromise necessitating ventilatory assistance and intubation Universal protocol:    Procedure explained and questions answered to patient or proxy's satisfaction: yes     Relevant documents present and verified: yes     Test results available and properly labeled: yes     Imaging studies available: yes     Required blood products, implants, devices, and special equipment available: yes     Site/side marked: no     Immediately prior to procedure a time out was called: yes     Patient identity confirmation method:  Arm band and verbally with patient Indications:    Procedure performed:  Dislocation reduction   Procedure necessitating sedation performed by:  Different physician Pre-sedation assessment:    Time since last food or drink:  5 hours   ASA classification: class 2 - patient with mild systemic disease     Neck mobility: normal     Mouth opening:  3 or more finger widths   Thyromental distance:  4 finger widths   Mallampati score:  II - soft palate, uvula, fauces visible   Pre-sedation assessments completed and reviewed: airway patency, cardiovascular function, hydration status, mental status, nausea/vomiting, pain level, respiratory function and temperature     Pre-sedation assessment completed:  04/29/2019 6:16 AM Immediate pre-procedure details:    Reassessment: Patient reassessed immediately prior to procedure     Reviewed: vital signs     Verified: bag valve mask available, emergency equipment available, intubation equipment available, IV patency confirmed, oxygen available and suction available   Procedure details (see MAR for exact dosages):    Preoxygenation:  Nasal cannula   Sedation:  Etomidate (times two attempts)   Intended level of  sedation: deep   Analgesia:  None   Intra-procedure monitoring:  Cardiac monitor, blood pressure monitoring, continuous capnometry, continuous pulse oximetry, frequent LOC assessments and frequent vital sign checks   Intra-procedure events: none     Total Provider sedation time (minutes):  45 Post-procedure details:    Post-sedation assessment completed:  04/29/2019 7:12 AM   Attendance: Constant attendance by certified staff until patient recovered     Recovery: Patient returned to pre-procedure baseline     Post-sedation assessments completed and reviewed: airway patency, cardiovascular function, hydration status, mental status, nausea/vomiting, pain level, respiratory function and temperature     Patient is stable for discharge or admission: yes     Patient tolerance:  Tolerated well, no immediate complications Comments:     Unsuccessful reduction despite 2 attempts.  Patient will be transferred to the operating room.       Taygan Connell, Jonny Ruiz, MD 04/29/19 [phone]

## 2019-04-29 NOTE — Anesthesia Procedure Notes (Addendum)
Procedure Name: LMA Insertion Date/Time: 04/29/2019 10:25 AM Performed by: Wynonia Sours, CRNA Pre-anesthesia Checklist: Patient identified, Emergency Drugs available, Suction available, Patient being monitored and Timeout performed Patient Re-evaluated:Patient Re-evaluated prior to induction Oxygen Delivery Method: Circle system utilized Preoxygenation: Pre-oxygenation with 100% oxygen Induction Type: IV induction LMA: LMA with gastric port inserted LMA Size: 5.0 Number of attempts: 1 Placement Confirmation: positive ETCO2 and breath sounds checked- equal and bilateral Tube secured with: Tape Dental Injury: Teeth and Oropharynx as per pre-operative assessment

## 2019-05-01 ENCOUNTER — Emergency Department (HOSPITAL_COMMUNITY)
Admission: EM | Admit: 2019-05-01 | Discharge: 2019-05-01 | Disposition: A | Discharge status: Home / Self Care | Payer: Medicare HMO | Attending: Emergency Medicine | Admitting: Emergency Medicine

## 2019-05-01 ENCOUNTER — Encounter (HOSPITAL_COMMUNITY): Payer: Self-pay | Admitting: Emergency Medicine

## 2019-05-01 ENCOUNTER — Emergency Department (HOSPITAL_COMMUNITY): Payer: Medicare HMO

## 2019-05-01 ENCOUNTER — Other Ambulatory Visit: Payer: Self-pay

## 2019-05-01 DIAGNOSIS — I10 Essential (primary) hypertension: Secondary | ICD-10-CM | POA: Insufficient documentation

## 2019-05-01 DIAGNOSIS — X500XXA Overexertion from strenuous movement or load, initial encounter: Secondary | ICD-10-CM | POA: Diagnosis not present

## 2019-05-01 DIAGNOSIS — Y929 Unspecified place or not applicable: Secondary | ICD-10-CM | POA: Diagnosis not present

## 2019-05-01 DIAGNOSIS — T84020A Dislocation of internal right hip prosthesis, initial encounter: Secondary | ICD-10-CM | POA: Insufficient documentation

## 2019-05-01 DIAGNOSIS — Z79899 Other long term (current) drug therapy: Secondary | ICD-10-CM | POA: Insufficient documentation

## 2019-05-01 DIAGNOSIS — Y999 Unspecified external cause status: Secondary | ICD-10-CM | POA: Insufficient documentation

## 2019-05-01 DIAGNOSIS — S73004A Unspecified dislocation of right hip, initial encounter: Secondary | ICD-10-CM

## 2019-05-01 DIAGNOSIS — Y9389 Activity, other specified: Secondary | ICD-10-CM | POA: Insufficient documentation

## 2019-05-01 DIAGNOSIS — Y792 Prosthetic and other implants, materials and accessory orthopedic devices associated with adverse incidents: Secondary | ICD-10-CM | POA: Insufficient documentation

## 2019-05-01 MED ORDER — KETAMINE HCL 50 MG/5ML IJ SOSY
0.5000 mg/kg | PREFILLED_SYRINGE | INTRAMUSCULAR | Status: DC | PRN
  Administered 2019-05-01: 17:00:00 50 mg via INTRAVENOUS
  Administered 2019-05-01: 17:00:00 25 mg via INTRAVENOUS

## 2019-05-01 MED ORDER — KETAMINE HCL 50 MG/5ML IJ SOSY
PREFILLED_SYRINGE | INTRAMUSCULAR | Status: AC
  Filled 2019-05-01: qty 10

## 2019-05-01 MED ORDER — KETAMINE HCL 50 MG/5ML IJ SOSY
0.5000 mg/kg | PREFILLED_SYRINGE | Freq: Once | INTRAMUSCULAR | Status: DC
  Filled 2019-05-01: qty 5

## 2019-05-01 MED ORDER — KETAMINE HCL 50 MG/5ML IJ SOSY
PREFILLED_SYRINGE | INTRAMUSCULAR | Status: AC
  Filled 2019-05-01: qty 5

## 2019-05-01 MED ORDER — PROPOFOL 10 MG/ML IV BOLUS
0.5000 mg/kg | Freq: Once | INTRAVENOUS | Status: AC
  Administered 2019-05-01: 17:00:00 75 mg via INTRAVENOUS
  Filled 2019-05-01: qty 20

## 2019-05-01 NOTE — Progress Notes (Signed)
CO2 range during hip reduction 40-45.

## 2019-05-01 NOTE — ED Notes (Signed)
Pt ambulaotyr in room, disconnected fluids and dressed self stating " my ride is here I have to go thanks for all you did I need to leave now.   MD made aware. Pt left without DC paperwork but verbalized understanding of DC instructions

## 2019-05-01 NOTE — ED Triage Notes (Signed)
Pt reports that when he went to sit down " to take a shit my hip came out of place again. This is the 45th time it has happened".

## 2019-05-01 NOTE — ED Notes (Signed)
Pt verbalizes understanding of DC instructions. Pt belongings returned and is ambulatory out of ED.  

## 2019-05-01 NOTE — ED Provider Notes (Signed)
[facility] DEPT Provider Note   CSN: [id number] Arrival date & time: 05/01/19  1448     History Chief Complaint  Patient presents with  . Hip Injury    right    Todd [name] is a 49 y.o. male with history of recurrent prosthetic hip dislocations who presents with R hip dislocation. He states he was wiping his butt while seated on the toilet when he felt his hip pop out of place. It occurred around 11AM this morning. He reports associated pain in the area. He is followed by Dr. Starlyn Skeans at [facility] who is planning on doing another revision of the hip but he is unsure of the date of this. He denies any paresthesias of the area. He is requesting reduction of the hip. He has been NPO since 1AM  HPI     Past Medical History:  Diagnosis Date  . Drug addiction ([place])   . 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 GERD (gastroesophageal reflux disease)   . H/O hiatal hernia   . Hypertension     Patient Active Problem List   Diagnosis Date Noted  . Closed dislocation of right hip ([place]) 04/29/2019  . Hypoxia 10/07/2015  . Mechanical complication of internal orthopedic device ([place]) 03/16/2015  . 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 Anemia 07/16/2014  . Altered mental status 10/20/2013  . Dislocation of hip prosthesis ([place]) 10/20/2013  . CAP (community acquired pneumonia) 10/20/2013  . Acute GI bleeding 09/19/2013  . Acute blood loss anemia 09/19/2013  . Essential hypertension 09/19/2013  . Sinus tachycardia 09/19/2013  . GASTROESOPHAGEAL REFLUX, NO ESOPHAGITIS 05/06/2006    Past Surgical History:  Procedure Laterality Date  . APPENDECTOMY     as child  . COLONOSCOPY WITH PROPOFOL  02/17/2012   Procedure: COLONOSCOPY WITH PROPOFOL;  Surgeon: Arta Silence, MD;  Location: [facility] ENDOSCOPY;  Service: Endoscopy;  Laterality: N/A;  . ESOPHAGOGASTRODUODENOSCOPY Left  09/19/2013   Procedure: ESOPHAGOGASTRODUODENOSCOPY (EGD);  Surgeon: Arta Silence, MD;  Location[facility] ENDOSCOPY;  Service: Endoscopy;  Laterality: Left;  . ESOPHAGOGASTRODUODENOSCOPY (EGD) WITH PROPOFOL  02/17/2012   Procedure: ESOPHAGOGASTRODUODENOSCOPY (EGD) WITH PROPOFOL;  Surgeon: Arta Silence, MD;  Location: [facility] ENDOSCOPY;  Service: Endoscopy;  Laterality: N/A;  . HIP CLOSED REDUCTION Right 10/20/2013   Procedure: CLOSED REDUCTION HIP;  Surgeon: Marybelle Killings, MD;  Location: [place];  Service: Orthopedics;  Laterality: Right;  . HIP CLOSED REDUCTION Right 02/18/2015   Procedure: CLOSED REDUCTION HIP;  Surgeon: Dorna Leitz, MD;  Location: [facility];  Service: Orthopedics;  Laterality: Right;  . HIP CLOSED REDUCTION Right 03/16/2015   Procedure: CLOSED REDUCTION HIP;  Surgeon: Mcarthur Rossetti, MD;  Location: [place];  Service: Orthopedics;  Laterality: Right;  . HIP CLOSED REDUCTION Right 04/29/2019   Procedure: CLOSED MANIPULATION HIP;  Surgeon: Marchia Bond, MD;  Location: [facility];  Service: Orthopedics;  Laterality: Right;  . JOINT REPLACEMENT  2005   Right Hip       Family History  Problem Relation Age of Onset  . Diabetes Mother     Social History   Tobacco Use  . Smoking status: Never Smoker  . Smokeless tobacco: Never Used  Substance Use Topics  . Alcohol use: No  . Drug use: No    Types: IV    Home Medications Prior to Admission medications   Medication Sig Start Date End Date Taking? Authorizing  Provider  busPIRone (BUSPAR) 10 MG tablet Take 10 mg by mouth 3 (three) times daily. 03/20/19   [provider]  clotrimazole (LOTRIMIN) 1 % cream Apply 1 application topically 3 (three) times daily. elbow    [provider]  dexmethylphenidate (FOCALIN) 10 MG tablet Take 10 mg by mouth 2 (two) times daily. 04/07/19   [provider]  FLUoxetine (PROZAC) 40 MG capsule Take 40 mg by mouth daily.    [provider]  fluticasone (FLONASE) 50  MCG/ACT nasal spray Place 2 sprays into both nostrils daily as needed for allergies or rhinitis.    [provider]  loratadine (CLARITIN) 10 MG tablet Take 10 mg by mouth daily.     [provider]  metoprolol (LOPRESSOR) 50 MG tablet Take 50 mg by mouth daily.     [provider]  Multiple Vitamin (MULTIVITAMIN) tablet Take 1 tablet by mouth daily.    [provider]  ondansetron (ZOFRAN) 4 MG tablet Take 4 mg by mouth every 8 (eight) hours as needed for nausea or vomiting.  11/26/18   [provider]  oxyCODONE (ROXICODONE) 15 MG immediate release tablet Take 15 mg by mouth 4 (four) times daily as needed for pain. 01/13/19   [provider]  pantoprazole (PROTONIX) 40 MG tablet Take 40 mg by mouth daily. 03/22/19   [provider]  promethazine (PHENERGAN) 25 MG tablet Take 25 mg by mouth at bedtime as needed for nausea/vomiting. 03/20/19   [provider]  testosterone cypionate (DEPOTESTOSTERONE CYPIONATE) 200 MG/ML injection Inject 200 mg into the muscle every 14 (fourteen) days.  11/11/18   [provider]  tiZANidine (ZANAFLEX) 4 MG tablet Take 4 mg by mouth 3 (three) times daily as needed for muscle spasms.  11/25/14   [provider]    Allergies    Ambien [zolpidem tartrate], Nu-iron [polysaccharide iron complex], Fentanyl, and Clonidine derivatives  Review of Systems   Review of Systems  Musculoskeletal: Positive for arthralgias.       +joint dislocation  Neurological: Negative for weakness and numbness.  All other systems reviewed and are negative.   Physical Exam Updated Vital Signs BP (!) 158/71 (BP Location: Left Arm)   Pulse 89   Temp 99.3 F (37.4 C) (Oral)   Resp 20   SpO2 96%   Physical Exam Vitals and nursing note reviewed.  Constitutional:      General: He is not in acute distress.    Appearance: Normal appearance. He is well-developed. He is not ill-appearing.  HENT:      Head: Normocephalic and atraumatic.  Eyes:     General: No scleral icterus.       Right eye: No discharge.        Left eye: No discharge.     Conjunctiva/sclera: Conjunctivae normal.     Pupils: Pupils are equal, round, and reactive to light.  Cardiovascular:     Rate and Rhythm: Normal rate.  Pulmonary:     Effort: Pulmonary effort is normal. No respiratory distress.  Abdominal:     General: There is no distension.  Musculoskeletal:     Cervical back: Normal range of motion.     Comments: R leg is shortened and internally rotated. 2+ DP pulse  Skin:    General: Skin is warm and dry.  Neurological:     Mental Status: He is alert and oriented to person, place, and time.  Psychiatric:  Behavior: Behavior normal.     ED Results / Procedures / Treatments   Labs (all labs ordered are listed, but only abnormal results are displayed) Labs Reviewed - No data to display  EKG None  Radiology DG Hip [place] W or [place] Pelvis 1 View Right  Result Date: 05/01/2019 CLINICAL DATA:  Hip dislocation, postreduction. EXAM: DG HIP (WITH OR WITHOUT PELVIS) 1V PORT RIGHT COMPARISON:  Radiographs earlier this day. FINDINGS: Femoral component of the right hip arthroplasty is now seated in the acetabular component. Femoral shaft with cerclage wires is intact were visualized. Osseous fragment lateral to the acetabulum is chronic. No evidence of acute fracture. IMPRESSION: Reduction of right hip arthroplasty dislocation. Electronically Signed   By: Narda Rutherford M.D.   On: 05/01/2019 18:29   DG Hip Unilat  With Pelvis 2-3 Views Right  Result Date: 05/01/2019 CLINICAL DATA:  49 year old male with right hip dislocation. EXAM: DG HIP (WITH OR WITHOUT PELVIS) 2-3V RIGHT COMPARISON:  Intraoperative fluoroscopic image dated 04/29/2019. FINDINGS: There is a total right hip arthroplasty. There is superior dislocation of the femoral component with the femoral head located superior to the acetabular  cup. There is no acute fracture. The soft tissues are unremarkable. IMPRESSION: Superiorly dislocated right hip arthroplasty. Electronically Signed   By: Elgie Collard M.D.   On: 05/01/2019 15:35    Procedures Reduction of dislocation  Date/Time: 05/01/2019 7:21 PM Performed by: [name], PA-C Authorized by: [name], PA-C  Consent: Verbal consent obtained. Risks and benefits: risks, benefits and alternatives were discussed Consent given by: patient Patient understanding: patient states understanding of the procedure being performed Patient consent: the patient's understanding of the procedure matches consent given Procedure consent: procedure consent matches procedure scheduled Relevant documents: relevant documents present and verified Test results: test results available and properly labeled Site marked: the operative site was marked Imaging studies: imaging studies available Required items: required blood products, implants, devices, and special equipment available Patient identity confirmed: verbally with patient and arm band Time out: Immediately prior to procedure a "time out" was called to verify the correct patient, procedure, equipment, support staff and site/side marked as required. Preparation: Patient was prepped and draped in the usual sterile fashion. Local anesthesia used: no  Anesthesia: Local anesthesia used: no  Sedation: Patient sedated: yes Sedation type: moderate (conscious) sedation Sedatives: ketamine and propofol Sedation start date/time: 05/01/2019 5:20 PM Sedation end date/time: 05/01/2019 5:35 PM Vitals: Vital signs were monitored during sedation.  Patient tolerance: patient tolerated the procedure well with no immediate complications    (including critical care time)\    Medications Ordered in ED Medications - No data to display  ED Course  I have reviewed the triage vital signs and the nursing notes.  Pertinent labs &  imaging results that were available during my care of the patient were reviewed by me and considered in my medical decision making (see chart for details).  49 year old male presents with R hip dislocation. This is a recurrent problem for him. BP is elevated but otherwise vitals are normal and he is resting comfortably. Xray confirms superiorly dislocated right hip. Shared visit with Dr. Effie Shy. Will plan for sedation and reduction.  5:29 PM Hip was successfully reduced. Will order post reduction films.  Around 5:50PM the patient told nursing staff that he had to leave bc his ride was here. I was unable to reassess him but Dr. Effie Shy was able to see him and print his d/c  paperwork.   Post-reduction films show adequately reduced hip.   MDM Rules/Calculators/A&P                       Final Clinical Impression(s) / ED Diagnoses Final diagnoses:  Dislocation of right hip, initial encounter [facility])    Rx / DC Orders ED Discharge Orders    None       [name], PA-C 05/01/19 Margretta Ditty    Mancel Bale, MD 05/02/19 1420

## 2019-05-01 NOTE — Discharge Instructions (Addendum)
Follow-up with your orthopedist at [facility] for further care and treatment of your recurrent right hip dislocation.

## 2019-05-01 NOTE — ED Provider Notes (Signed)
  Face-to-face evaluation   History: He presents for evaluation of a suspected dislocated hip, when he was using the commode to have a bowel movement.  History of same numerous times.  Here 2 days ago with same, required operative intervention, for failed ED reduction.  He was not using his knee immobilizer today when the hip came out.  He states he is working to get a revision surgery.  It has not yet scheduled.  Physical exam: Awake, alert and cooperative.  Right leg shortened, flexed at hip and knee, and internally rotated.  Neurovascular intact distally in the right foot.  .Sedation  Date/Time: 05/01/2019 5:30 PM Performed by: Mancel Bale, MD Authorized by: Mancel Bale, MD   Consent:    Consent obtained:  Written   Consent given by:  Patient   Risks discussed:  Inadequate sedation, vomiting and nausea   Alternatives discussed:  Analgesia without sedation Universal protocol:    Immediately prior to procedure a time out was called: yes   Indications:    Procedure performed:  Dislocation reduction   Procedure necessitating sedation performed by:  Physician performing sedation Pre-sedation assessment:    Time since last food or drink:  13 hours   ASA classification: class 2 - patient with mild systemic disease     Mouth opening:  3 or more finger widths   Mallampati score:  II - soft palate, uvula, fauces visible   Pre-sedation assessments completed and reviewed: airway patency, cardiovascular function, hydration status, mental status and respiratory function     Pre-sedation assessments completed and reviewed: pre-procedure nausea and vomiting status not reviewed and pre-procedure pain level not reviewed     Pre-sedation assessment completed:  05/01/2019 5:10 PM Immediate pre-procedure details:    Reassessment: Patient reassessed immediately prior to procedure     Reviewed: vital signs and NPO status     Verified: bag valve mask available, emergency equipment available,  intubation equipment available, IV patency confirmed, oxygen available and suction available   Procedure details (see MAR for exact dosages):    Preoxygenation:  Nasal cannula   Sedation:  Etomidate and propofol   Intended level of sedation: deep   Intra-procedure monitoring:  Blood pressure monitoring, cardiac monitor, continuous capnometry, continuous pulse oximetry, frequent LOC assessments and frequent vital sign checks   Intra-procedure events: none     Total Provider sedation time (minutes):  12 Post-procedure details:    Post-sedation assessment completed:  05/01/2019 5:28 PM   Attendance: Constant attendance by certified staff until patient recovered     Recovery: Patient returned to pre-procedure baseline     Post-sedation assessments completed and reviewed: airway patency, cardiovascular function, hydration status, mental status and respiratory function     Post-sedation assessments completed and reviewed: nausea/vomiting not reviewed and pain level not reviewed     Patient is stable for discharge or admission: yes     Patient tolerance:  Tolerated well, no immediate complications   5:56 PM.  The patient has removed his IV, dressed, and is standing at the bedside stating he is ready to go home.  Will discharge patient.  Medical screening examination/treatment/procedure(s) were conducted as a shared visit with non-physician practitioner(s) and myself.  I personally evaluated the patient during the encounter    Mancel Bale, MD 05/02/19 1420

## 2019-05-14 ENCOUNTER — Emergency Department: Payer: Medicare Other | Admitting: Certified Registered"

## 2019-05-14 ENCOUNTER — Emergency Department: Payer: Medicare Other

## 2019-05-14 ENCOUNTER — Observation Stay
Admission: EM | Admit: 2019-05-14 | Discharge: 2019-05-15 | Discharge status: Against Medical Advice | Payer: Medicare Other | Attending: Specialist | Admitting: Specialist

## 2019-05-14 ENCOUNTER — Other Ambulatory Visit: Payer: Self-pay

## 2019-05-14 ENCOUNTER — Encounter
Admission: EM | Discharge status: Against Medical Advice | Payer: Self-pay | Source: Home / Self Care | Attending: Emergency Medicine

## 2019-05-14 DIAGNOSIS — K219 Gastro-esophageal reflux disease without esophagitis: Secondary | ICD-10-CM | POA: Diagnosis not present

## 2019-05-14 DIAGNOSIS — Y831 Surgical operation with implant of artificial internal device as the cause of abnormal reaction of the patient, or of later complication, without mention of misadventure at the time of the procedure: Secondary | ICD-10-CM | POA: Diagnosis not present

## 2019-05-14 DIAGNOSIS — I10 Essential (primary) hypertension: Secondary | ICD-10-CM | POA: Insufficient documentation

## 2019-05-14 DIAGNOSIS — Z79899 Other long term (current) drug therapy: Secondary | ICD-10-CM | POA: Diagnosis not present

## 2019-05-14 DIAGNOSIS — T84020A Dislocation of internal right hip prosthesis, initial encounter: Secondary | ICD-10-CM | POA: Diagnosis present

## 2019-05-14 DIAGNOSIS — Z20822 Contact with and (suspected) exposure to covid-19: Secondary | ICD-10-CM | POA: Diagnosis not present

## 2019-05-14 DIAGNOSIS — Z9889 Other specified postprocedural states: Secondary | ICD-10-CM

## 2019-05-14 DIAGNOSIS — T84029A Dislocation of unspecified internal joint prosthesis, initial encounter: Secondary | ICD-10-CM

## 2019-05-14 DIAGNOSIS — Z96649 Presence of unspecified artificial hip joint: Secondary | ICD-10-CM

## 2019-05-14 DIAGNOSIS — S73004A Unspecified dislocation of right hip, initial encounter: Secondary | ICD-10-CM

## 2019-05-14 HISTORY — PX: HIP CLOSED REDUCTION: SHX983

## 2019-05-14 LAB — CBC WITH DIFFERENTIAL/PLATELET
Abs Immature Granulocytes: 0.02 10*3/uL (ref 0.00–0.07)
Basophils Absolute: 0.1 10*3/uL (ref 0.0–0.1)
Basophils Relative: 1 %
Eosinophils Absolute: 0.1 10*3/uL (ref 0.0–0.5)
Eosinophils Relative: 1 %
HCT: 44.7 % (ref 39.0–52.0)
Hemoglobin: 14.9 g/dL (ref 13.0–17.0)
Immature Granulocytes: 0 %
Lymphocytes Relative: 8 %
Lymphs Abs: 0.7 10*3/uL (ref 0.7–4.0)
MCH: 29 pg (ref 26.0–34.0)
MCHC: 33.3 g/dL (ref 30.0–36.0)
MCV: 87 fL (ref 80.0–100.0)
Monocytes Absolute: 0.8 10*3/uL (ref 0.1–1.0)
Monocytes Relative: 9 %
Neutro Abs: 6.8 10*3/uL (ref 1.7–7.7)
Neutrophils Relative %: 81 %
Platelets: 343 10*3/uL (ref 150–400)
RBC: 5.14 MIL/uL (ref 4.22–5.81)
RDW: 13.1 % (ref 11.5–15.5)
WBC: 8.5 10*3/uL (ref 4.0–10.5)
nRBC: 0 % (ref 0.0–0.2)

## 2019-05-14 LAB — BASIC METABOLIC PANEL
Anion gap: 6 (ref 5–15)
BUN: 12 mg/dL (ref 6–20)
CO2: 26 mmol/L (ref 22–32)
Calcium: 8.8 mg/dL — ABNORMAL LOW (ref 8.9–10.3)
Chloride: 104 mmol/L (ref 98–111)
Creatinine, Ser: 1.05 mg/dL (ref 0.61–1.24)
GFR calc Af Amer: >60 mL/min (ref >60)
GFR calc non Af Amer: >60 mL/min (ref >60)
Glucose, Bld: 203 mg/dL — ABNORMAL HIGH (ref 70–99)
Potassium: 4.3 mmol/L (ref 3.5–5.1)
Sodium: 136 mmol/L (ref 135–145)

## 2019-05-14 LAB — RESPIRATORY PANEL BY RT PCR (FLU A&B, COVID)
Influenza A by PCR: NEGATIVE
Influenza B by PCR: NEGATIVE
SARS Coronavirus 2 by RT PCR: NEGATIVE

## 2019-05-14 SURGERY — CLOSED REDUCTION, HIP
Anesthesia: General | Site: Hip | Laterality: Right

## 2019-05-14 MED ORDER — LIDOCAINE HCL (CARDIAC) PF 100 MG/5ML IV SOSY
PREFILLED_SYRINGE | INTRAVENOUS | Status: DC | PRN
  Administered 2019-05-14: 80 mg via INTRAVENOUS

## 2019-05-14 MED ORDER — SODIUM CHLORIDE 0.9 % IV SOLN: INTRAVENOUS | Status: DC

## 2019-05-14 MED ORDER — PROPOFOL 10 MG/ML IV BOLUS
1.0000 mg/kg | Freq: Once | INTRAVENOUS | Status: AC
  Administered 2019-05-14: 200 mg via INTRAVENOUS
  Filled 2019-05-14: qty 20

## 2019-05-14 MED ORDER — MIDAZOLAM HCL 2 MG/2ML IJ SOLN
INTRAMUSCULAR | Status: DC | PRN
  Administered 2019-05-14: 2 mg via INTRAVENOUS

## 2019-05-14 MED ORDER — SUCCINYLCHOLINE CHLORIDE 20 MG/ML IJ SOLN
INTRAMUSCULAR | Status: DC | PRN
  Administered 2019-05-14: 60 mg via INTRAVENOUS

## 2019-05-14 MED ORDER — HYDROMORPHONE HCL 1 MG/ML IJ SOLN: 0.5000 mg | INTRAMUSCULAR | Status: DC | PRN

## 2019-05-14 MED ORDER — ETOMIDATE 2 MG/ML IV SOLN
20.0000 mg | Freq: Once | INTRAVENOUS | Status: DC
  Filled 2019-05-14: qty 10

## 2019-05-14 MED ORDER — PROPOFOL 10 MG/ML IV BOLUS
INTRAVENOUS | Status: AC
  Filled 2019-05-14: qty 40

## 2019-05-14 MED ORDER — FENTANYL CITRATE (PF) 100 MCG/2ML IJ SOLN
INTRAMUSCULAR | Status: DC | PRN
  Administered 2019-05-14: 75 ug via INTRAVENOUS
  Administered 2019-05-14: 25 ug via INTRAVENOUS

## 2019-05-14 MED ORDER — MORPHINE SULFATE (PF) 2 MG/ML IV SOLN
1.0000 mg | INTRAVENOUS | Status: DC | PRN
  Administered 2019-05-14: 1 mg via INTRAVENOUS
  Filled 2019-05-14: qty 1

## 2019-05-14 MED ORDER — HYDROCODONE-ACETAMINOPHEN 5-325 MG PO TABS
1.0000 | ORAL_TABLET | ORAL | Status: DC | PRN
  Administered 2019-05-15: 1 via ORAL
  Filled 2019-05-14: qty 1

## 2019-05-14 MED ORDER — FENTANYL CITRATE (PF) 100 MCG/2ML IJ SOLN: 25.0000 ug | INTRAMUSCULAR | Status: DC | PRN

## 2019-05-14 MED ORDER — ONDANSETRON HCL 4 MG/2ML IJ SOLN: 4.0000 mg | Freq: Once | INTRAMUSCULAR | Status: DC | PRN

## 2019-05-14 MED ORDER — HYDROMORPHONE HCL 1 MG/ML IJ SOLN
1.0000 mg | Freq: Once | INTRAMUSCULAR | Status: AC
  Administered 2019-05-14: 1 mg via INTRAVENOUS
  Filled 2019-05-14: qty 1

## 2019-05-14 MED ORDER — FENTANYL CITRATE (PF) 100 MCG/2ML IJ SOLN
INTRAMUSCULAR | Status: AC
  Filled 2019-05-14: qty 2

## 2019-05-14 MED ORDER — PROPOFOL 500 MG/50ML IV EMUL
INTRAVENOUS | Status: AC | PRN
  Administered 2019-05-14: 100 mg via INTRAVENOUS

## 2019-05-14 MED ORDER — MIDAZOLAM HCL 2 MG/2ML IJ SOLN
INTRAMUSCULAR | Status: AC
  Filled 2019-05-14: qty 2

## 2019-05-14 SURGICAL SUPPLY — 2 items
IMMOB KNEE 24 THIGH 24 443303 (SOFTGOODS) ×2 IMPLANT
KIT TURNOVER KIT A (KITS) IMPLANT

## 2019-05-14 NOTE — Anesthesia Procedure Notes (Signed)
Procedure Name: LMA Insertion Date/Time: 05/14/2019 9:43 PM Performed by: Clyde Lundborg, CRNA Pre-anesthesia Checklist: Patient identified, Emergency Drugs available, Suction available and Patient being monitored Patient Re-evaluated:Patient Re-evaluated prior to induction Oxygen Delivery Method: Circle system utilized Preoxygenation: Pre-oxygenation with 100% oxygen Induction Type: IV induction Ventilation: Mask ventilation without difficulty LMA: LMA inserted LMA Size: 4.0 Tube type: Oral Number of attempts: 1 Placement Confirmation: positive ETCO2,  breath sounds checked- equal and bilateral and CO2 detector Tube secured with: Tape Dental Injury: Teeth and Oropharynx as per pre-operative assessment

## 2019-05-14 NOTE — Discharge Instructions (Signed)

## 2019-05-14 NOTE — ED Notes (Signed)
Pt informed of md's declination to order additional pain medication at this time.

## 2019-05-14 NOTE — Anesthesia Preprocedure Evaluation (Signed)
Anesthesia Evaluation  Patient identified by MRN, date of birth, ID band Patient awake    Reviewed: Allergy & Precautions, H&P , NPO status , Patient's Chart, lab work & pertinent test results, reviewed documented beta blocker date and time   Airway Mallampati: II  TM Distance: >3 FB Neck ROM: full    Dental  (+) Teeth Intact   Pulmonary neg pulmonary ROS, pneumonia,    Pulmonary exam normal        Cardiovascular Exercise Tolerance: Poor hypertension, On Medications negative cardio ROS Normal cardiovascular exam Rate:Normal     Neuro/Psych negative neurological ROS  negative psych ROS   GI/Hepatic negative GI ROS, Neg liver ROS, hiatal hernia, GERD  Medicated,  Endo/Other  negative endocrine ROS  Renal/GU negative Renal ROS  negative genitourinary   Musculoskeletal   Abdominal   Peds  Hematology negative hematology ROS (+) Blood dyscrasia, anemia ,   Anesthesia Other Findings   Reproductive/Obstetrics negative OB ROS                             Anesthesia Physical Anesthesia Plan  ASA: III and emergent  Anesthesia Plan: General LMA   Post-op Pain Management:    Induction:   PONV Risk Score and Plan: 3  Airway Management Planned:   Additional Equipment:   Intra-op Plan:   Post-operative Plan:   Informed Consent: I have reviewed the patients History and Physical, chart, labs and discussed the procedure including the risks, benefits and alternatives for the proposed anesthesia with the patient or authorized representative who has indicated his/her understanding and acceptance.       Plan Discussed with: CRNA  Anesthesia Plan Comments:         Anesthesia Quick Evaluation

## 2019-05-14 NOTE — ED Triage Notes (Signed)
Pt arrives via EMS from home after popping his right hip out- states this has happened multiple times before- pt also bradycardic in the mid40s

## 2019-05-14 NOTE — ED Provider Notes (Signed)
[facility] Emergency Department Provider Note  ____________________________________________  Time seen: Approximately 5:37 PM  I have reviewed the triage vital signs and the nursing notes.   HISTORY  Chief Complaint Hip Pain    HPI Todd [name] is a 49 y.o. 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  Sudden onset of severe pain nonradiating, worse with movement, unable to bear weight.  No alleviating factors.  Constant.  Denies fall or other trauma.      Past Medical History:  Diagnosis Date  . Drug addiction (HCC)   . 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 GERD (gastroesophageal reflux disease)   . H/O hiatal hernia   . Hypertension      Patient Active Problem List   Diagnosis Date Noted  . Closed dislocation of right hip (HCC) 04/29/2019  . Hypoxia 10/07/2015  . Mechanical complication of internal orthopedic device (HCC) 03/16/2015  . 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 Anemia 07/16/2014  . Altered mental status 10/20/2013  . Dislocation of hip prosthesis (HCC) 10/20/2013  . CAP (community acquired pneumonia) 10/20/2013  . Acute GI bleeding 09/19/2013  . Acute blood loss anemia 09/19/2013  . Essential hypertension 09/19/2013  . Sinus tachycardia 09/19/2013  . GASTROESOPHAGEAL REFLUX, NO ESOPHAGITIS 05/06/2006     Past Surgical History:  Procedure Laterality Date  . APPENDECTOMY     as child  . COLONOSCOPY WITH PROPOFOL  02/17/2012   Procedure: COLONOSCOPY WITH PROPOFOL;  Surgeon: Willis Modena, MD;  Location: [facility] ENDOSCOPY;  Service: Endoscopy;  Laterality: N/A;  . ESOPHAGOGASTRODUODENOSCOPY Left  09/19/2013   Procedure: ESOPHAGOGASTRODUODENOSCOPY (EGD);  Surgeon: Willis Modena, MD;  Location: [facility] ENDOSCOPY;  Service: Endoscopy;  Laterality: Left;  . ESOPHAGOGASTRODUODENOSCOPY (EGD) WITH PROPOFOL  02/17/2012   Procedure: ESOPHAGOGASTRODUODENOSCOPY (EGD) WITH PROPOFOL;  Surgeon: Willis Modena, MD;  Location: [facility] ENDOSCOPY;  Service: Endoscopy;  Laterality: N/A;  . HIP CLOSED REDUCTION Right 10/20/2013   Procedure: CLOSED REDUCTION HIP;  Surgeon: Eldred Manges, MD;  Location: [facility] OR;  Service: Orthopedics;  Laterality: Right;  . HIP CLOSED REDUCTION Right 02/18/2015   Procedure: CLOSED REDUCTION HIP;  Surgeon: Jodi Geralds, MD;  Location: [facility] ORS;  Service: Orthopedics;  Laterality: Right;  . HIP CLOSED REDUCTION Right 03/16/2015   Procedure: CLOSED REDUCTION HIP;  Surgeon: Kathryne Hitch, MD;  Location: [facility] OR;  Service: Orthopedics;  Laterality: Right;  . HIP CLOSED REDUCTION Right 04/29/2019   Procedure: CLOSED MANIPULATION HIP;  Surgeon: Teryl Lucy, MD;  Location: [facility] ORS;  Service: Orthopedics;  Laterality: Right;  . JOINT REPLACEMENT  2005   Right Hip     Prior to Admission medications   Medication Sig Start Date End Date Taking? Authorizing Provider  busPIRone (BUSPAR) 10 MG tablet Take 10 mg by mouth 3 (three) times daily. 03/20/19   [provider]  clotrimazole (LOTRIMIN) 1 % cream Apply 1 application topically 3 (three) times daily. elbow    [provider]  dexmethylphenidate (FOCALIN) 10 MG tablet Take 10 mg by mouth 2 (two) times daily. 04/07/19   [provider]  FLUoxetine (PROZAC) 40 MG capsule Take 40 mg by mouth daily.  [provider]  fluticasone (FLONASE) 50 MCG/ACT nasal spray Place 2 sprays into both nostrils daily as needed for allergies or rhinitis.    [provider]  loratadine (CLARITIN) 10 MG tablet Take 10 mg by mouth daily.     [provider]  metoprolol (LOPRESSOR) 50 MG tablet Take 50 mg by mouth  daily.     [provider]  Multiple Vitamin (MULTIVITAMIN) tablet Take 1 tablet by mouth daily.    [provider]  ondansetron (ZOFRAN) 4 MG tablet Take 4 mg by mouth every 8 (eight) hours as needed for nausea or vomiting.  11/26/18   [provider]  oxyCODONE (ROXICODONE) 15 MG immediate release tablet Take 15 mg by mouth 4 (four) times daily as needed for pain. 01/13/19   [provider]  pantoprazole (PROTONIX) 40 MG tablet Take 40 mg by mouth daily. 03/22/19   [provider]  promethazine (PHENERGAN) 25 MG tablet Take 25 mg by mouth at bedtime as needed for nausea/vomiting. 03/20/19   [provider]  testosterone cypionate (DEPOTESTOSTERONE CYPIONATE) 200 MG/ML injection Inject 200 mg into the muscle every 14 (fourteen) days.  11/11/18   [provider]  tiZANidine (ZANAFLEX) 4 MG tablet Take 4 mg by mouth 3 (three) times daily as needed for muscle spasms.  11/25/14   [provider]     Allergies Ambien [zolpidem tartrate], Nu-iron [polysaccharide iron complex], Fentanyl, and Clonidine derivatives   Family History  Problem Relation Age of Onset  . Diabetes Mother     Social History Social History   Tobacco Use  . Smoking status: Never Smoker  . Smokeless tobacco: Never Used  Substance Use Topics  . Alcohol use: No  . Drug use: No    Types: IV    Review of Systems  Constitutional:   No fever or chills.  ENT:   No sore throat. No rhinorrhea. Cardiovascular:   No chest pain or syncope. Respiratory:   No dyspnea or cough. Gastrointestinal:   Negative for abdominal pain, vomiting and diarrhea.  Musculoskeletal:   Right hip pain as above. All other systems reviewed and are negative except as documented above in ROS and HPI.  ____________________________________________   PHYSICAL EXAM:  VITAL SIGNS: ED Triage Vitals  Enc Vitals Group     BP 05/14/19 1734 (!) 191/77     Pulse Rate 05/14/19 1734 (!)  46     Resp 05/14/19 1734 16     Temp 05/14/19 1734 97.8 F (36.6 C)     Temp Source 05/14/19 1734 Oral     SpO2 05/14/19 1734 99 %     Weight 05/14/19 1735 180 lb 12.4 oz (82 kg)     Height 05/14/19 1735 6\' 2"  (1.88 m)     Head Circumference --      Peak Flow --      Pain Score 05/14/19 1735 10     Pain Loc --      Pain Edu? --      Excl. in GC? --     Vital signs reviewed, nursing assessments reviewed.   Constitutional:   Alert and oriented. Non-toxic appearance. Eyes:   Conjunctivae are normal. EOMI. PERRL. ENT      Head:   Normocephalic and atraumatic.      Nose:   Wearing a mask.      Mouth/Throat:   Wearing a mask.      Neck:   No meningismus. Full ROM. Hematological/Lymphatic/Immunilogical:  No cervical lymphadenopathy. Cardiovascular:   Bradycardia, rate of 50.. Symmetric bilateral radial and DP pulses.  No murmurs. Cap refill less than 2 seconds. Respiratory:   Normal respiratory effort without tachypnea/retractions. Breath sounds are clear and equal bilaterally. No wheezes/rales/rhonchi. Gastrointestinal:   Soft and nontender. Non distended. There is no CVA tenderness.  No rebound, rigidity, or guarding.  Musculoskeletal:   Reduced range of motion the right hip, painful with passive range of motion.  There is shortening of the right leg and external rotation.  No long bone deformities.  Other extremities unremarkable with normal range of motion. Neurologic:   Normal speech and language.  Motor grossly intact. No acute focal neurologic deficits are appreciated.  Skin:    Skin is warm, dry and intact. No rash noted.  No petechiae, purpura, or bullae.  ____________________________________________    LABS (pertinent positives/negatives) (all labs ordered are listed, but only abnormal results are displayed) Labs Reviewed  BASIC METABOLIC PANEL - Abnormal; Notable for the following components:      Result Value   Glucose, Bld 203 (*)    Calcium 8.8 (*)    All  other components within normal limits  RESPIRATORY PANEL BY RT PCR (FLU A&B, COVID)  CBC WITH DIFFERENTIAL/PLATELET   ____________________________________________   EKG  Interpreted by me Sinus bradycardia rate of 44, normal axis and intervals.  Poor R wave progression.  There is diffuse ST elevation in the inferior and lateral leads with pronounced T waves.  Similar in appearance to prior EKG on April 29, 2019.  ____________________________________________    RADIOLOGY  DG Hip Unilat W or Wo Pelvis 2-3 Views Right  Result Date: 05/14/2019 CLINICAL DATA:  Suspected dislocation EXAM: DG HIP (WITH OR WITHOUT PELVIS) 2-3V RIGHT COMPARISON:  Radiograph 05/01/2019 FINDINGS: Recurrent posterolateral dislocation of the patient's total right hip arthroplasty. Femoral component demonstrates a collared femoral stem secured distally with cerclage wires. There is right hip swelling. No other acute osseous abnormality is seen. No evidence of periprosthetic fracture or progressive hardware loosening. IMPRESSION: Recurrent posterolateral dislocation of the patient's total right hip arthroplasty. Electronically Signed   By: Kreg Shropshire M.D.   On: 05/14/2019 18:13    ____________________________________________   PROCEDURES .Sedation  Date/Time: 05/14/2019 7:21 PM Performed by: Sharman Cheek, MD Authorized by: Sharman Cheek, MD   Consent:    Consent obtained:  Verbal and written   Consent given by:  Patient   Risks discussed:  Allergic reaction, dysrhythmia, inadequate sedation, nausea, prolonged hypoxia resulting in organ damage, prolonged sedation necessitating reversal, respiratory compromise necessitating ventilatory assistance and intubation and vomiting   Alternatives discussed:  Analgesia without sedation, anxiolysis and regional anesthesia Universal protocol:    Procedure explained and questions answered to patient or proxy's satisfaction: yes     Relevant documents present and  verified: yes     Test results available and properly labeled: yes     Imaging studies available: yes     Required blood products, implants, devices, and special equipment available: yes     Site/side marked: yes     Immediately prior to procedure a time out was called: yes     Patient identity confirmation method:  Verbally with patient Indications:    Procedure performed:  Dislocation reduction   Procedure necessitating sedation performed by:  Physician performing sedation Pre-sedation assessment:    Time since last food or drink:  12 hours   ASA classification: class 1 - normal, healthy patient     Neck  mobility: normal     Mouth opening:  3 or more finger widths   Thyromental distance:  4 finger widths   Mallampati score:  I - soft palate, uvula, fauces, pillars visible   Pre-sedation assessments completed and reviewed: airway patency, cardiovascular function, hydration status, mental status, nausea/vomiting, pain level, respiratory function and temperature     Pre-sedation assessment completed:  05/14/2019 6:30 PM Immediate pre-procedure details:    Reassessment: Patient reassessed immediately prior to procedure     Reviewed: vital signs, relevant labs/tests and NPO status     Verified: bag valve mask available, emergency equipment available, intubation equipment available, IV patency confirmed, oxygen available and suction available   Procedure details (see MAR for exact dosages):    Preoxygenation:  Nasal cannula   Sedation:  Propofol   Intended level of sedation: deep   Analgesia:  Hydromorphone   Intra-procedure monitoring:  Blood pressure monitoring, cardiac monitor, continuous pulse oximetry, frequent LOC assessments, frequent vital sign checks and continuous capnometry   Intra-procedure events: none     Total Provider sedation time (minutes):  20 Post-procedure details:    Post-sedation assessment completed:  05/14/2019 7:22 PM   Attendance: Constant attendance by certified  staff until patient recovered     Recovery: Patient returned to pre-procedure baseline     Post-sedation assessments completed and reviewed: airway patency, cardiovascular function, hydration status, mental status, nausea/vomiting, pain level, respiratory function and temperature     Patient is stable for discharge or admission: yes     Patient tolerance:  Tolerated well, no immediate complications Comments:        .Ortho Injury Treatment  Date/Time: 05/14/2019 7:22 PM Performed by: Carrie Mew, MD Authorized by: Carrie Mew, MD   Consent:    Consent obtained:  Verbal and written   Consent given by:  Patient   Risks discussed:  Irreducible dislocation, fracture, recurrent dislocation, restricted joint movement and nerve damage   Alternatives discussed:  Alternative treatment and delayed treatmentInjury location: hip Location details: right hip Injury type: dislocation Dislocation type: posterior Spontaneous dislocation: yes Prosthesis: yes Pre-procedure neurovascular assessment: neurovascularly intact Pre-procedure distal perfusion: normal Pre-procedure neurological function: normal Pre-procedure range of motion: reduced  Anesthesia: Local anesthesia used: no  Patient sedated: Yes. Refer to sedation procedure documentation for details of sedation. Manipulation performed: yes Reduction method: Allis maneuver, Rochester method and Bigelow technique Reduction successful: no Post-procedure neurovascular assessment: post-procedure neurovascularly intact Post-procedure distal perfusion: normal Post-procedure neurological function: normal Post-procedure range of motion: unchanged Patient tolerance: patient tolerated the procedure well with no immediate complications Comments: Good sedation achieved with propofol 100mg . With patient relaxed, multiple attempts at reduction attempted without success. No appreciable mobility of the prosthetic  head.     ____________________________________________    CLINICAL IMPRESSION / ASSESSMENT AND PLAN / ED COURSE  Medications ordered in the ED: Medications  propofol (DIPRIVAN) 10 mg/mL bolus/IV push 82 mg (has no administration in time range)  etomidate (AMIDATE) injection 20 mg (has no administration in time range)  0.9 %  sodium chloride infusion (has no administration in time range)  morphine 2 MG/ML injection 1 mg (has no administration in time range)  HYDROmorphone (DILAUDID) injection 1 mg (1 mg Intravenous Given 05/14/19 1742)  propofol (DIPRIVAN) 500 MG/50ML infusion (100 mg Intravenous New Bag/Given 05/14/19 1852)    Pertinent labs & imaging results that were available during my care of the patient were reviewed by me and considered in my medical decision making (see chart for details).  Todd [name] was evaluated in Emergency Department on 05/14/2019 for the symptoms described in the history of present illness. He was evaluated in the context of the global COVID-19 pandemic, which necessitated consideration that the patient might be at risk for infection with the SARS-CoV-2 virus that causes COVID-19. Institutional protocols and algorithms that pertain to the evaluation of patients at risk for COVID-19 are in a state of rapid change based on information released by regulatory bodies including the CDC and federal and state organizations. These policies and algorithms were followed during the patient's care in the ED.   Patient presents with likely recurrent dislocation of right hip prosthesis after sitting down on the toilet.  EKG obtained due to noted bradycardia on exam, which confirms a sinus rhythm with normal intervals.  ST morphology is unusual, but grossly unchanged from previous, and he has no chest pain or shortness of breath or anginal symptoms.  Do not think he is having a cardiac ischemic event.    No other complaints, no fall or other trauma.  X-ray confirms posterior  dislocation of the prosthesis.  With patient's consent, sedation under propofol was attempted, good sedation achieved, unable to reduce the hip.  Multiple attempts were made, but no mobility was felt in the prosthesis head.  ----------------------------------------- 7:16 PM on 05/14/2019 -----------------------------------------  Case discussed with Ortho Dr. Hyacinth Meeker who will come for close reduction under anesthesia.  We will need to repeat Covid screening first.       ____________________________________________   FINAL CLINICAL IMPRESSION(S) / ED DIAGNOSES    Final diagnoses:  Dislocation of hip joint prosthesis, initial encounter [facility])     ED Discharge Orders    None      Portions of this note were generated with dragon dictation software. Dictation errors may occur despite best attempts at proofreading.   Sharman Cheek, MD 05/14/19 1924

## 2019-05-14 NOTE — ED Notes (Signed)
Patient given and helped to adjust pillows for comfort of right leg/hip.Additional blankets also given.

## 2019-05-14 NOTE — Progress Notes (Signed)
Pt unable to find a ride home. Dr. Hyacinth Meeker notified. Acknowledged. Orders received.

## 2019-05-14 NOTE — H&P (Signed)
THE PATIENT WAS SEEN PRIOR TO SURGERY TODAY.  HISTORY, ALLERGIES, HOME MEDICATIONS AND OPERATIVE PROCEDURE WERE REVIEWED. RISKS AND BENEFITS OF SURGERY DISCUSSED WITH PATIENT AGAIN.  NO CHANGES FROM INITIAL HISTORY AND PHYSICAL NOTED.    

## 2019-05-14 NOTE — ED Notes (Signed)
Answered pt's call bell per primary RN. Pt states "I am in pain, get my doctor in here. I need pain medication". Pt states he usually takes dilaudid for pain "more than one milligram at a time". Pt informed will speak with MD regarding pt request for pain medication. Dr. Scotty Court notified of pt request for pain medication.

## 2019-05-14 NOTE — H&P (Signed)
PREOPERATIVE H&P  Chief Complaint: Right Hip dislocation  HPI: Todd [name] is a 49 y.o. male who dislocated his right total hip replacement earlier today when he sat on the toilet.  He was not wearing his knee immobilizer.  The patient apparently has had many multiple dislocations in the past.  He states that he is scheduled to have revision surgery at the end of the month in [place].  Having severe pain in the hip.  He wishes to proceed with closed reduction of the total hip replacement.  Risk benefits and postop protocol were discussed with them.  Advised him that I did not perform revision hip surgeries and would not be performing any open procedure if I was unable to reduce this closed.  He understands and agrees with this.  Past Medical History:  Diagnosis Date  . Drug addiction ([place])   . 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 GERD (gastroesophageal reflux disease)   . H/O hiatal hernia   . Hypertension    Past Surgical History:  Procedure Laterality Date  . APPENDECTOMY     as child  . COLONOSCOPY WITH PROPOFOL  02/17/2012   Procedure: COLONOSCOPY WITH PROPOFOL;  Surgeon: Arta Silence, MD;  Location: [facility];  Service: Endoscopy;  Laterality: N/A;  . ESOPHAGOGASTRODUODENOSCOPY Left 09/19/2013   Procedure: ESOPHAGOGASTRODUODENOSCOPY (EGD);  Surgeon: Arta Silence, MD;  Location: [facility];  Service: Endoscopy;  Laterality: Left;  . ESOPHAGOGASTRODUODENOSCOPY (EGD) WITH PROPOFOL  02/17/2012   Procedure: ESOPHAGOGASTRODUODENOSCOPY (EGD) WITH PROPOFOL;  Surgeon: Arta Silence, MD;  Location: [facility];  Service: Endoscopy;  Laterality: N/A;  . HIP CLOSED REDUCTION Right 10/20/2013   Procedure: CLOSED REDUCTION HIP;  Surgeon: Marybelle Killings, MD;  Location: [facility];  Service: Orthopedics;  Laterality: Right;  . HIP CLOSED REDUCTION Right 02/18/2015   Procedure: CLOSED REDUCTION HIP;  Surgeon: Dorna Leitz, MD;  Location: [facility];   Service: Orthopedics;  Laterality: Right;  . HIP CLOSED REDUCTION Right 03/16/2015   Procedure: CLOSED REDUCTION HIP;  Surgeon: Mcarthur Rossetti, MD;  Location: [facility];  Service: Orthopedics;  Laterality: Right;  . HIP CLOSED REDUCTION Right 04/29/2019   Procedure: CLOSED MANIPULATION HIP;  Surgeon: Marchia Bond, MD;  Location: [facility];  Service: Orthopedics;  Laterality: Right;  . JOINT REPLACEMENT  2005   Right Hip   Social History   Socioeconomic History  . Marital status: Married    Spouse name: Not on file  . Number of children: Not on file  . Years of education: Not on file  . Highest education level: Not on file  Occupational History  . Not on file  Tobacco Use  . Smoking status: Never Smoker  . Smokeless tobacco: Never Used  Substance and Sexual Activity  . Alcohol use: No  . Drug use: No    Types: IV  . Sexual activity: Yes  Other Topics Concern  . Not on file  Social History Narrative  . Not on file   Social Determinants of Health   Financial Resource Strain:   . Difficulty of Paying Living Expenses: Not on file  Food Insecurity:   . Worried About Charity fundraiser in the Last Year: Not on file  . Ran Out of Food in the Last Year: Not on file  Transportation Needs:   . Lack of Transportation (Medical): Not on file  . Lack of Transportation (Non-Medical): Not on file  Physical Activity:   . Days  of Exercise per Week: Not on file  . Minutes of Exercise per Session: Not on file  Stress:   . Feeling of Stress : Not on file  Social Connections:   . Frequency of Communication with Friends and Family: Not on file  . Frequency of Social Gatherings with Friends and Family: Not on file  . Attends Religious Services: Not on file  . Active Member of Clubs or Organizations: Not on file  . Attends Banker Meetings: Not on file  . Marital Status: Not on file   Family History  Problem Relation Age of Onset  . Diabetes Mother    Allergies   Allergen Reactions  . Ambien [Zolpidem Tartrate] Other (See Comments)    sedation  . Nu-Iron [Polysaccharide Iron Complex] Diarrhea  . Fentanyl Other (See Comments)    Unknown  . Clonidine Derivatives Other (See Comments)    Caused Trudie Buckler syndrome   Prior to Admission medications   Medication Sig Start Date End Date Taking? Authorizing Provider  busPIRone (BUSPAR) 10 MG tablet Take 10 mg by mouth 3 (three) times daily. 03/20/19  Yes [provider]  dexmethylphenidate (FOCALIN) 10 MG tablet Take 10 mg by mouth 2 (two) times daily. 04/07/19  Yes [provider]  FLUoxetine (PROZAC) 40 MG capsule Take 40 mg by mouth daily.   Yes [provider]  fluticasone (FLONASE) 50 MCG/ACT nasal spray Place 2 sprays into both nostrils daily as needed for allergies or rhinitis.   Yes [provider]  loratadine (CLARITIN) 10 MG tablet Take 10 mg by mouth daily.    Yes [provider]  metoprolol (LOPRESSOR) 50 MG tablet Take 50 mg by mouth 2 (two) times daily.    Yes [provider]  ondansetron (ZOFRAN) 4 MG tablet Take 4 mg by mouth every 8 (eight) hours as needed for nausea or vomiting.  11/26/18  Yes [provider]  oxyCODONE (ROXICODONE) 15 MG immediate release tablet Take 15 mg by mouth 4 (four) times daily as needed for pain. 01/13/19  Yes [provider]  pantoprazole (PROTONIX) 40 MG tablet Take 40 mg by mouth 2 (two) times daily.  03/22/19  Yes [provider]  promethazine (PHENERGAN) 25 MG tablet Take 25 mg by mouth at bedtime as needed for nausea/vomiting. 03/20/19  Yes [provider]  testosterone cypionate (DEPOTESTOSTERONE CYPIONATE) 200 MG/ML injection Inject 200 mg into the muscle every 14 (fourteen) days.  11/11/18  Yes [provider]  tiZANidine (ZANAFLEX) 4 MG tablet Take 4 mg by mouth 5 (five) times daily as needed for muscle spasms.    Yes [provider]     Positive  ROS: All other systems have been reviewed and were otherwise negative with the exception of those mentioned in the HPI and as above.  Physical Exam: General: Alert, no acute distress Cardiovascular: No pedal edema. Heart is regular and without murmur.  Respiratory: No cyanosis, no use of accessory musculature. Lungs are clear. GI: No organomegaly, abdomen is soft and non-tender Skin: No lesions in the area of chief complaint Neurologic: Sensation intact distally Psychiatric: Patient is competent for consent with normal mood and affect Lymphatic: No axillary or cervical lymphadenopathy  MUSCULOSKELETAL: Patient alert cooperative in moderate pain.  Right leg is shortened internally rotated and painful with range of motion.  He has well-healed proximal incision.  Neurovascular status good distally.  Left leg and other extremities are normal.  Assessment: Right Hip dislocation  Plan: Plan  for Procedure(s): CLOSED REDUCTION HIP  The risks benefits and alternatives were discussed with the patient including but not limited to the risks of nonoperative treatment, versus surgical intervention including infection, bleeding, nerve injury,  blood clots, cardiopulmonary complications, morbidity, mortality, among others, and they were willing to proceed.   Valinda Hoar, MD [phone]   05/14/2019 9:35 PM

## 2019-05-14 NOTE — Transfer of Care (Signed)
Immediate Anesthesia Transfer of Care Note  Patient: Todd [name]  Procedure(s) Performed: CLOSED REDUCTION HIP (Right Hip)  Patient Location: PACU  Anesthesia Type:General  Level of Consciousness: awake, alert  and oriented  Airway & Oxygen Therapy: Patient Spontanous Breathing and Patient connected to face mask oxygen  Post-op Assessment: Report given to RN and Post -op Vital signs reviewed and stable  Post vital signs: Reviewed and stable  Last Vitals:  Vitals Value Taken Time  BP    Temp    Pulse    Resp    SpO2      Last Pain:  Vitals:   05/14/19 2055  TempSrc:   PainSc: 10-Worst pain ever         Complications: No apparent anesthesia complications

## 2019-05-14 NOTE — Op Note (Signed)
ORDATE@  10:01 PM  PATIENT:  Todd [name]    PRE-OPERATIVE DIAGNOSIS:  Right Hip dislocation  POST-OPERATIVE DIAGNOSIS:  Same  PROCEDURE:  CLOSED REDUCTION HIP RIGHT  SURGEON:  Valinda Hoar, MD   ANESTHESIA:   General  PREOPERATIVE INDICATIONS:  [name] is a  48 y.o. male with a diagnosis of Right Hip dislocation who failed closed reduction in the ER and is being brought to the operating room for reduction under anesthesia.    The risks benefits and alternatives were discussed with the patient preoperatively including but not limited to the risks of infection, bleeding, nerve injury, cardiopulmonary complications, the need for revision surgery, among others, and the patient was willing to proceed.  OPERATIVE IMPLANTS: None  OPERATIVE FINDINGS: Dislocated total hip right  EBL: None  COMPLICATIONS:   None  OPERATIVE PROCEDURE: The patient underwent satisfactory general anesthesia in the supine position on the operating room table.  The dislocated hip was seen to be short and unstable.  Using manual traction directed distally and anteriorly the hip was reduced with a satisfactory pop.  Range of motion was stable and leg lengths were restored.  The fluoroscopy was used to show that the hip was indeed reduced.  A knee immobilizer was applied to prevent hip and knee flexion.  The patient was awakened and taken to recovery in good condition.  Valinda Hoar, MD

## 2019-05-15 NOTE — Progress Notes (Addendum)
Patient walked out of room and stated he was leaving. This nurse asked the patient to wait and he refused. Notified nursing supervisor and security to find the patient and remove the IV. Notified on call provider Webb Silversmith NP.

## 2019-05-15 NOTE — Progress Notes (Signed)
Dr. Hyacinth Meeker notified of incident.

## 2019-05-15 NOTE — Discharge Summary (Signed)
Physician Discharge Summary  Patient ID: Todd [name] MRN: [id number] DOB/AGE: 1970-12-30 49 y.o.  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 Drug addiction ([facility])   . 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 GERD (gastroesophageal reflux disease)   . H/O hiatal hernia   . Hypertension     Surgeries: Procedure(s): CLOSED REDUCTION HIP on 05/14/2019   Consultants (if any):   Discharged Condition: Improved  Hospital Course: Todd [name] is an 49 y.o. male who was admitted 05/14/2019 with a diagnosis of <principal problem not specified> and went to the operating room on 05/14/2019 and underwent the above named procedures.  Postoperatively the patient was able to be discharged but he did not have a ride home after surgery.  He was therefore placed in outpatient status on the orthopedic floor.  The patient became agitated during the middle the night and left the hospital AGAINST MEDICAL ADVICE without signing the paperwork for this.  Nursing staff notified me after this it happened.  Patient does have orthopedic care elsewhere and is scheduled to have revision hip surgery in [facility] later this month.  He was advised to keep the new knee immobilizer on at all times prevent recurrent dislocation.    Anti-infectives (From admission, onward)   None    .     He benefited maximally from the hospital stay and there were no complications.  The patient left the hospital in the middle of the night against medical advice and refused to sign such documents. He should follow up with his own Orthopaedic surgeons in [place] and [place].   Recent vital signs:  Vitals:   05/14/19 2334 05/14/19 2353  BP: 96/63  110/65  Pulse: 65 72  Resp: 14 16  Temp: 98.3 F (36.8 C) 98 F (36.7 C)  SpO2: 98% 100%    Recent laboratory studies:  Lab Results  Component Value Date   HGB 14.9 05/14/2019   HGB 13.1 04/29/2019   HGB 14.2 01/30/2019   Lab Results  Component Value Date   WBC 8.5 05/14/2019   PLT 343 05/14/2019   Lab Results  Component Value Date   INR 0.95 09/19/2013   Lab Results  Component Value Date   NA 136 05/14/2019   K 4.3 05/14/2019   CL 104 05/14/2019   CO2 26 05/14/2019   BUN 12 05/14/2019   CREATININE 1.05 05/14/2019   GLUCOSE 203 (H) 05/14/2019    Discharge Medications:   Allergies as of 05/15/2019      Reactions   Ambien [zolpidem Tartrate] Other (See Comments)   sedation   Nu-iron [polysaccharide Iron Complex] Diarrhea   Fentanyl Other (See Comments)   Unknown   Clonidine Derivatives Other (See Comments)   Caused Trudie Buckler syndrome      Medication List    TAKE these medications   busPIRone 10 MG tablet Commonly known as: BUSPAR Take 10 mg by mouth 3 (three) times daily.   dexmethylphenidate 10 MG tablet Commonly known as: FOCALIN Take 10 mg by mouth 2 (two) times daily.   FLUoxetine 40 MG capsule Commonly known as: PROZAC Take 40 mg by mouth daily.   fluticasone 50 MCG/ACT nasal spray Commonly known as: FLONASE Place 2 sprays into  both nostrils daily as needed for allergies or rhinitis.   loratadine 10 MG tablet Commonly known as: CLARITIN Take 10 mg by mouth daily.   metoprolol tartrate 50 MG tablet Commonly known as: LOPRESSOR Take 50 mg by mouth 2 (two) times daily.   ondansetron 4 MG tablet Commonly known as: ZOFRAN Take 4 mg by mouth every 8 (eight) hours as needed for nausea or vomiting.   oxyCODONE 15 MG immediate release tablet Commonly known as: ROXICODONE Take 15 mg by mouth 4 (four) times daily as needed for pain.   pantoprazole 40 MG tablet Commonly known as: PROTONIX Take 40 mg by mouth 2 (two) times daily.    promethazine 25 MG tablet Commonly known as: PHENERGAN Take 25 mg by mouth at bedtime as needed for nausea/vomiting.   testosterone cypionate 200 MG/ML injection Commonly known as: DEPOTESTOSTERONE CYPIONATE Inject 200 mg into the muscle every 14 (fourteen) days.   tiZANidine 4 MG tablet Commonly known as: ZANAFLEX Take 4 mg by mouth 5 (five) times daily as needed for muscle spasms.       Diagnostic Studies: DG Pelvis Portable  Result Date: 05/14/2019 CLINICAL DATA:  49 year old male status post attempted reduction of prosthetic hip dislocation. EXAM: PORTABLE PELVIS 1-2 VIEWS COMPARISON:  1743 hours today. FINDINGS: Portable AP supine view at 1934 hours. Unchanged right side prosthetic hip dislocation from 1743 hours. Stable visible hardware. No new No acute osseous abnormality identified. IMPRESSION: Stable right prosthetic hip dislocation from earlier. Electronically Signed   By: Odessa Fleming M.D.   On: 05/14/2019 19:50   DG C-Arm 1-60 Min-No Report  Result Date: 04/29/2019 Fluoroscopy was utilized by the requesting physician.  No radiographic interpretation.   DG Hip Unilat W or Wo Pelvis 1 View Right  Result Date: 04/18/2019 CLINICAL DATA:  Postreduction EXAM: DG HIP (WITH OR WITHOUT PELVIS) 1V RIGHT COMPARISON:  None. FINDINGS: Normal alignment of right total hip arthroplasty following reduction. No fracture. IMPRESSION: Normal alignment of right total hip arthroplasty following reduction. Electronically Signed   By: Deatra Robinson M.D.   On: 04/18/2019 03:40   DG Hip [place] W or Wo Pelvis 1 View Right  Result Date: 05/01/2019 CLINICAL DATA:  Hip dislocation, postreduction. EXAM: DG HIP (WITH OR WITHOUT PELVIS) 1V PORT RIGHT COMPARISON:  Radiographs earlier this day. FINDINGS: Femoral component of the right hip arthroplasty is now seated in the acetabular component. Femoral shaft with cerclage wires is intact were visualized. Osseous fragment lateral to the acetabulum is chronic. No  evidence of acute fracture. IMPRESSION: Reduction of right hip arthroplasty dislocation. Electronically Signed   By: Narda Rutherford M.D.   On: 05/01/2019 18:29   DG Hip Port Unilat W or Wo Pelvis 1 View Right  Result Date: 04/18/2019 CLINICAL DATA:  Hip dislocation EXAM: DG HIP (WITH OR WITHOUT PELVIS) 1V PORT RIGHT COMPARISON:  None. FINDINGS: There is superior dislocation of the right total hip arthroplasty. No periprosthetic fracture. There is approximately 3 cm of overriding. IMPRESSION: Superior dislocation of the right total hip arthroplasty. Electronically Signed   By: Deatra Robinson M.D.   On: 04/18/2019 02:19   DG HIP OPERATIVE UNILAT W OR W/O PELVIS RIGHT  Result Date: 05/14/2019 CLINICAL DATA:  Hip reduction EXAM: OPERATIVE RIGHT HIP (WITH PELVIS IF PERFORMED) 2 VIEWS TECHNIQUE: Fluoroscopic spot image(s) were submitted for interpretation post-operatively. COMPARISON:  May 14, 2019 FINDINGS: Patient has undergone successful reduction of the previously demonstrated right hip dislocation. The hardware appears grossly intact where visualized.  There is no obvious displaced fracture. The total fluoroscopy time was 2 seconds. IMPRESSION: Successful reduction of the previously demonstrated right hip dislocation. Electronically Signed   By: Katherine Mantle M.D.   On: 05/14/2019 22:16   DG HIP OPERATIVE UNILAT WITH PELVIS RIGHT  Result Date: 04/29/2019 CLINICAL DATA:  Closed reduction of right hip dislocation EXAM: OPERATIVE RIGHT HIP WITH PELVIS COMPARISON:  04/29/2019 right hip radiographs FLUOROSCOPY TIME:  Fluoroscopy Time:  0 minutes 1 second Radiation Exposure Index (if provided by the fluoroscopic device): 0.11 mGy Number of Acquired Spot Images: 1 FINDINGS: Spot fluoroscopic intraoperative right hip radiograph demonstrates right total hip arthroplasty with no residual malalignment in the right hip on this single frontal view. IMPRESSION: Intraoperative fluoroscopic guidance for closed  reduction of right total hip arthroplasty dislocation. Electronically Signed   By: Delbert Phenix M.D.   On: 04/29/2019 10:55   DG Hip Unilat W or Wo Pelvis 2-3 Views Right  Result Date: 05/14/2019 CLINICAL DATA:  Suspected dislocation EXAM: DG HIP (WITH OR WITHOUT PELVIS) 2-3V RIGHT COMPARISON:  Radiograph 05/01/2019 FINDINGS: Recurrent posterolateral dislocation of the patient's total right hip arthroplasty. Femoral component demonstrates a collared femoral stem secured distally with cerclage wires. There is right hip swelling. No other acute osseous abnormality is seen. No evidence of periprosthetic fracture or progressive hardware loosening. IMPRESSION: Recurrent posterolateral dislocation of the patient's total right hip arthroplasty. Electronically Signed   By: Kreg Shropshire M.D.   On: 05/14/2019 18:13   DG Hip Unilat  With Pelvis 2-3 Views Right  Result Date: 05/01/2019 CLINICAL DATA:  49 year old male with right hip dislocation. EXAM: DG HIP (WITH OR WITHOUT PELVIS) 2-3V RIGHT COMPARISON:  Intraoperative fluoroscopic image dated 04/29/2019. FINDINGS: There is a total right hip arthroplasty. There is superior dislocation of the femoral component with the femoral head located superior to the acetabular cup. There is no acute fracture. The soft tissues are unremarkable. IMPRESSION: Superiorly dislocated right hip arthroplasty. Electronically Signed   By: Elgie Collard M.D.   On: 05/01/2019 15:35   DG Hip Unilat  With Pelvis 2-3 Views Right  Result Date: 04/29/2019 CLINICAL DATA:  History of right hip dislocation. EXAM: DG HIP (WITH OR WITHOUT PELVIS) 2-3V RIGHT COMPARISON:  04/18/2019 FINDINGS: Recurrent dislocation of the prosthetic femoral head, similar to the previous instances. No evidence of fracture or other complicating feature. 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 Electronically Signed   By: Paulina Fusi M.D.   On: 04/29/2019 05:59    Disposition:  Discharge disposition: 01-Home or Self Care       Discharge Instructions    Apply knee immobilizer   Complete by: As directed    To be worn at all times   Call MD for:  persistant nausea and vomiting   Complete by: As directed    Call MD for:  redness, tenderness, or signs of infection (pain, swelling, redness, odor or green/yellow discharge around incision site)   Complete by: As directed    Call MD for:  severe uncontrolled pain   Complete by: As directed    Call MD for:  temperature >100.4   Complete by: As directed    Diet - low sodium heart healthy   Complete by: As directed    Discharge instructions   Complete by: As directed    Elevate operative leg as much as possible. Move knee frequently. Ice as needed. Return to clinic as appointed.   Increase activity slowly  Complete by: As directed          Signed: Park Breed 05/15/2019, 11:38 AM

## 2019-05-17 NOTE — Anesthesia Postprocedure Evaluation (Signed)
Anesthesia Post Note  Patient: MAXIMILIEN HAYASHI  Procedure(s) Performed: CLOSED REDUCTION HIP (Right Hip)  Patient location during evaluation: PACU Anesthesia Type: General Level of consciousness: awake and alert Pain management: pain level controlled Vital Signs Assessment: post-procedure vital signs reviewed and stable Respiratory status: spontaneous breathing, nonlabored ventilation, respiratory function stable and patient connected to nasal cannula oxygen Cardiovascular status: blood pressure returned to baseline and stable Postop Assessment: no apparent nausea or vomiting Anesthetic complications: no     Last Vitals:  Vitals:   05/14/19 2334 05/14/19 2353  BP: 96/63 110/65  Pulse: 65 72  Resp: 14 16  Temp: 36.8 C 36.7 C  SpO2: 98% 100%    Last Pain:  Vitals:   05/14/19 2353  TempSrc: Oral  PainSc:                  Yevette Edwards

## 2019-05-21 ENCOUNTER — Encounter (HOSPITAL_COMMUNITY): Payer: Self-pay | Admitting: Emergency Medicine

## 2019-05-21 ENCOUNTER — Emergency Department (HOSPITAL_COMMUNITY): Payer: Medicare Other

## 2019-05-21 ENCOUNTER — Emergency Department (HOSPITAL_COMMUNITY)
Admission: EM | Admit: 2019-05-21 | Discharge: 2019-05-21 | Disposition: A | Discharge status: Home / Self Care | Payer: Medicare Other | Attending: Emergency Medicine | Admitting: Emergency Medicine

## 2019-05-21 ENCOUNTER — Other Ambulatory Visit: Payer: Self-pay

## 2019-05-21 DIAGNOSIS — Y792 Prosthetic and other implants, materials and accessory orthopedic devices associated with adverse incidents: Secondary | ICD-10-CM | POA: Insufficient documentation

## 2019-05-21 DIAGNOSIS — Z79899 Other long term (current) drug therapy: Secondary | ICD-10-CM | POA: Diagnosis not present

## 2019-05-21 DIAGNOSIS — T84020A Dislocation of internal right hip prosthesis, initial encounter: Secondary | ICD-10-CM | POA: Diagnosis not present

## 2019-05-21 DIAGNOSIS — I10 Essential (primary) hypertension: Secondary | ICD-10-CM | POA: Insufficient documentation

## 2019-05-21 DIAGNOSIS — S73004A Unspecified dislocation of right hip, initial encounter: Secondary | ICD-10-CM

## 2019-05-21 DIAGNOSIS — M25551 Pain in right hip: Secondary | ICD-10-CM

## 2019-05-21 MED ORDER — PROPOFOL 10 MG/ML IV BOLUS
0.5000 mg/kg | Freq: Once | INTRAVENOUS | Status: DC
  Filled 2019-05-21: qty 20

## 2019-05-21 MED ORDER — PROPOFOL 10 MG/ML IV BOLUS
INTRAVENOUS | Status: AC | PRN
  Administered 2019-05-21: 40 mg via INTRAVENOUS
  Administered 2019-05-21 (×4): 20 mg via INTRAVENOUS

## 2019-05-21 MED ORDER — KETAMINE HCL 10 MG/ML IJ SOLN
INTRAMUSCULAR | Status: AC | PRN
  Administered 2019-05-21: 10 mg via INTRAVENOUS
  Administered 2019-05-21: 40 mg via INTRAVENOUS

## 2019-05-21 MED ORDER — KETAMINE HCL 50 MG/5ML IJ SOSY
0.5000 mg/kg | PREFILLED_SYRINGE | Freq: Once | INTRAMUSCULAR | Status: DC
  Filled 2019-05-21: qty 5

## 2019-05-21 MED ORDER — AMOXICILLIN 500 MG PO CAPS: 500.0000 mg | ORAL_CAPSULE | Freq: Three times a day (TID) | ORAL | 0 refills | Status: DC

## 2019-05-21 NOTE — ED Provider Notes (Signed)
[facility] DEPT Provider Note   CSN: [id number] Arrival date & time: 05/21/19  7106     History Chief Complaint  Patient presents with  . Hip Pain    Todd [name] is a 49 y.o. male.  Patient is a 49 year old male who has had a history of recurrent right hip dislocations.  He has a prosthetic hip and is scheduled to have a revision on March 23.  He says that he was sleeping and dream that his hip popped out of place and in fact it had.  This happened during the night.  His last oral intake was last night before he went to bed.  He denies any other complaints.  No other recent trauma.  He has a history of hypertension and GERD.  He denies any recent illnesses.  No prior history of issues with anesthesia.        Past Medical History:  Diagnosis Date  . Drug addiction ([place])   . 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 GERD (gastroesophageal reflux disease)   . H/O hiatal hernia   . Hypertension     Patient Active Problem List   Diagnosis Date Noted  . 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 Closed dislocation of right hip ([place]) 04/29/2019  . Hypoxia 10/07/2015  . Mechanical complication of internal orthopedic device ([place]) 03/16/2015  . 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 Anemia 07/16/2014  . Altered mental status 10/20/2013  . Dislocation of hip prosthesis ([place]) 10/20/2013  . CAP (community acquired pneumonia) 10/20/2013  . Acute GI bleeding 09/19/2013  . Acute blood loss anemia 09/19/2013  . Essential hypertension 09/19/2013  . Sinus tachycardia 09/19/2013  . GASTROESOPHAGEAL REFLUX, NO ESOPHAGITIS 05/06/2006    Past Surgical History:  Procedure Laterality Date  . APPENDECTOMY     as child  . COLONOSCOPY WITH PROPOFOL  02/17/2012   Procedure: COLONOSCOPY WITH PROPOFOL;  Surgeon: Arta Silence, MD;   Location: [facility];  Service: Endoscopy;  Laterality: N/A;  . ESOPHAGOGASTRODUODENOSCOPY Left 09/19/2013   Procedure: ESOPHAGOGASTRODUODENOSCOPY (EGD);  Surgeon: Arta Silence, MD;  Location: [facility] ENDOSCOPY;  Service: Endoscopy;  Laterality: Left;  . ESOPHAGOGASTRODUODENOSCOPY (EGD) WITH PROPOFOL  02/17/2012   Procedure: ESOPHAGOGASTRODUODENOSCOPY (EGD) WITH PROPOFOL;  Surgeon: Arta Silence, MD;  Location: [facility];  Service: Endoscopy;  Laterality: N/A;  . HIP CLOSED REDUCTION Right 10/20/2013   Procedure: CLOSED REDUCTION HIP;  Surgeon: Marybelle Killings, MD;  Location: [place];  Service: Orthopedics;  Laterality: Right;  . HIP CLOSED REDUCTION Right 02/18/2015   Procedure: CLOSED REDUCTION HIP;  Surgeon: Dorna Leitz, MD;  Location: [facility];  Service: Orthopedics;  Laterality: Right;  . HIP CLOSED REDUCTION Right 03/16/2015   Procedure: CLOSED REDUCTION HIP;  Surgeon: Mcarthur Rossetti, MD;  Location: [place];  Service: Orthopedics;  Laterality: Right;  . HIP CLOSED REDUCTION Right 04/29/2019   Procedure: CLOSED MANIPULATION HIP;  Surgeon: Marchia Bond, MD;  Location: [facility];  Service: Orthopedics;  Laterality: Right;  . HIP CLOSED REDUCTION Right 05/14/2019   Procedure: CLOSED REDUCTION HIP;  Surgeon: Earnestine Leys, MD;  Location: [facility] ORS;  Service: Orthopedics;  Laterality: Right;  . JOINT REPLACEMENT  2005   Right Hip       Family History  Problem Relation Age of Onset  . Diabetes Mother     Social History   Tobacco Use  .  Smoking status: Never Smoker  . Smokeless tobacco: Never Used  Substance Use Topics  . Alcohol use: No  . Drug use: No    Types: IV    Home Medications Prior to Admission medications   Medication Sig Start Date End Date Taking? Authorizing Provider  busPIRone (BUSPAR) 10 MG tablet Take 10 mg by mouth 3 (three) times daily. 03/20/19  Yes [provider]  dexmethylphenidate (FOCALIN) 10 MG tablet Take 10 mg by mouth 2 (two) times daily. 04/07/19   Yes [provider]  FLUoxetine (PROZAC) 40 MG capsule Take 40 mg by mouth daily.   Yes [provider]  loratadine (CLARITIN) 10 MG tablet Take 10 mg by mouth daily.    Yes [provider]  metoprolol (LOPRESSOR) 50 MG tablet Take 50 mg by mouth 2 (two) times daily.    Yes [provider]  Multiple Vitamins-Minerals (MENS MULTIVITAMIN) TABS Take 1 tablet by mouth daily.   Yes [provider]  ondansetron (ZOFRAN) 4 MG tablet Take 4 mg by mouth every 8 (eight) hours as needed for nausea or vomiting.  11/26/18  Yes [provider]  oxyCODONE (ROXICODONE) 15 MG immediate release tablet Take 15 mg by mouth every 6 (six) hours.  01/13/19  Yes [provider]  oxymetazoline (AFRIN) 0.05 % nasal spray Place 1 spray into both nostrils 2 (two) times daily as needed for congestion.   Yes [provider]  pantoprazole (PROTONIX) 40 MG tablet Take 40 mg by mouth 2 (two) times daily.  03/22/19  Yes [provider]  promethazine (PHENERGAN) 25 MG tablet Take 25 mg by mouth at bedtime as needed for nausea/vomiting. 03/20/19  Yes [provider]  testosterone cypionate (DEPOTESTOSTERONE CYPIONATE) 200 MG/ML injection Inject 200 mg into the muscle every 14 (fourteen) days.  11/11/18  Yes [provider]  tiZANidine (ZANAFLEX) 4 MG tablet Take 4 mg by mouth every 6 (six) hours.    Yes [provider]  triamcinolone ointment (KENALOG) 0.1 % Apply 1 application topically 2 (two) times daily as needed (psoriasis).   Yes [provider]  amoxicillin (AMOXIL) 500 MG capsule Take 1 capsule (500 mg total) by mouth 3 (three) times daily. 05/21/19   Rolan Bucco, MD    Allergies    Ambien [zolpidem tartrate], Nu-iron [polysaccharide iron complex], and Fentanyl  Review of Systems   Review of Systems  Constitutional: Negative for chills, diaphoresis, fatigue and fever.  HENT: Negative for congestion, rhinorrhea  and sneezing.   Eyes: Negative.   Respiratory: Negative for cough, chest tightness and shortness of breath.   Cardiovascular: Negative for chest pain and leg swelling.  Gastrointestinal: Negative for abdominal pain, blood in stool, diarrhea, nausea and vomiting.  Genitourinary: Negative for difficulty urinating, flank pain, frequency and hematuria.  Musculoskeletal: Positive for arthralgias. Negative for back pain.  Skin: Negative for rash.  Neurological: Negative for dizziness, speech difficulty, weakness, numbness and headaches.    Physical Exam Updated Vital Signs BP 118/76   Pulse 74   Temp 98.2 F (36.8 C) (Oral)   Resp 14   Ht 6\' 1"  (1.854 m)   Wt 79.4 kg   SpO2 96%   BMI 23.09 kg/m   Physical Exam Constitutional:      Appearance: He is well-developed.  HENT:     Head: Normocephalic and atraumatic.  Eyes:     Pupils: Pupils are equal, round, and reactive to light.  Cardiovascular:     Rate and Rhythm:  Normal rate and regular rhythm.     Heart sounds: Normal heart sounds.  Pulmonary:     Effort: Pulmonary effort is normal. No respiratory distress.     Breath sounds: Normal breath sounds. No wheezing or rales.  Chest:     Chest wall: No tenderness.  Abdominal:     General: Bowel sounds are normal.     Palpations: Abdomen is soft.     Tenderness: There is no abdominal tenderness. There is no guarding or rebound.  Musculoskeletal:        General: Normal range of motion.     Cervical back: Normal range of motion and neck supple.     Comments: Right leg is internally rotated.  Pedal pulses intact  Lymphadenopathy:     Cervical: No cervical adenopathy.  Skin:    General: Skin is warm and dry.     Findings: No rash.  Neurological:     Mental Status: He is alert and oriented to person, place, and time.     ED Results / Procedures / Treatments   Labs (all labs ordered are listed, but only abnormal results are displayed) Labs Reviewed - No data to  display  EKG None  Radiology DG HIP PORT UNILAT WITH PELVIS 1V RIGHT  Result Date: 05/21/2019 CLINICAL DATA:  Post right hip reduction EXAM: DG HIP (WITH OR WITHOUT PELVIS) 1V PORT RIGHT COMPARISON:  05/21/2019 at 0704 hours FINDINGS: Interval reduction of the right hip arthroplasty. No fracture is seen. 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 Left hip joint space is preserved. Visualized bony pelvis appears intact. IMPRESSION: Interval reduction of the right hip arthroplasty. 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 Electronically Signed   By: Charline Bills M.D.   On: 05/21/2019 08:45   DG Hip Unilat W or Wo Pelvis 2-3 Views Right  Result Date: 05/21/2019 CLINICAL DATA:  Dislocation EXAM: DG HIP (WITH OR WITHOUT PELVIS) 2-3V RIGHT COMPARISON:  05/14/2019 FINDINGS: Dislocation of the right hip arthroplasty femoral head component relative to the acetabular cup. On the frontal radiograph, this is superiorly displaced and likely is posteriorly displaced, although that cannot be confirmed due to lack of true lateral view. No fracture is seen. Left hip joint space is preserved. Visualized bony pelvis appears intact. IMPRESSION: Recurrent prosthetic right hip dislocation, as described above. Electronically Signed   By: Charline Bills M.D.   On: 05/21/2019 07:53    Procedures .Sedation  Date/Time: 05/21/2019 8:59 AM Performed by: Rolan Bucco, MD Authorized by: Rolan Bucco, MD   Consent:    Consent obtained:  Written   Consent given by:  Patient   Risks discussed:  Allergic reaction, inadequate sedation, vomiting, prolonged hypoxia resulting in organ damage, prolonged sedation necessitating reversal and respiratory compromise necessitating ventilatory assistance and intubation   Alternatives discussed:  Analgesia without sedation Universal protocol:    Procedure explained and questions answered to patient or proxy's satisfaction: yes      Relevant documents present and verified: yes     Test results available and properly labeled: yes     Imaging studies available: yes     Required blood products, implants, devices, and special equipment available: yes     Site/side marked: no     Immediately prior to procedure a time out was called: yes     Patient identity confirmation method:  Verbally with patient Indications:    Procedure performed:  Dislocation reduction   Procedure necessitating sedation performed by:  Physician performing sedation Pre-sedation assessment:    Time since last food or drink:  8 hours   ASA classification: class 2 - patient with mild systemic disease     Neck mobility: normal     Mouth opening:  3 or more finger widths   Thyromental distance:  3 finger widths   Mallampati score:  II - soft palate, uvula, fauces visible   Pre-sedation assessments completed and reviewed: airway patency, cardiovascular function, hydration status, mental status, nausea/vomiting, pain level, respiratory function and temperature     Pre-sedation assessment completed:  05/21/2019 8:00 AM Immediate pre-procedure details:    Reassessment: Patient reassessed immediately prior to procedure     Reviewed: vital signs and NPO status     Verified: bag valve mask available, emergency equipment available, intubation equipment available, IV patency confirmed and oxygen available   Procedure details (see MAR for exact dosages):    Preoxygenation:  Nasal cannula   Sedation:  Ketamine and propofol   Intended level of sedation: deep   Intra-procedure monitoring:  Blood pressure monitoring, cardiac monitor, continuous capnometry, continuous pulse oximetry, frequent LOC assessments and frequent vital sign checks   Intra-procedure events: none     Total Provider sedation time (minutes):  8 Post-procedure details:    Post-sedation assessment completed:  05/21/2019 9:02 AM   Attendance: Constant attendance by certified staff until patient  recovered     Recovery: Patient returned to pre-procedure baseline     Post-sedation assessments completed and reviewed: airway patency, cardiovascular function, hydration status, mental status, nausea/vomiting, pain level, respiratory function and temperature     Patient is stable for discharge or admission: yes     Patient tolerance:  Tolerated well, no immediate complications  Reduction of dislocation  Date/Time: 05/21/2019 9:04 AM Performed by: Rolan BuccoBelfi, Vasilisa Vore, MD Authorized by: Rolan BuccoBelfi, Aubrielle Stroud, MD  Consent: Written consent obtained. Risks and benefits: risks, benefits and alternatives were discussed Consent given by: patient Patient understanding: patient states understanding of the procedure being performed Patient consent: the patient's understanding of the procedure matches consent given Procedure consent: procedure consent matches procedure scheduled Relevant documents: relevant documents present and verified Test results: test results available and properly labeled Site marked: the operative site was marked Imaging studies: imaging studies available Required items: required blood products, implants, devices, and special equipment available Patient identity confirmed: verbally with patient Time out: Immediately prior to procedure a "time out" was called to verify the correct patient, procedure, equipment, support staff and site/side marked as required. Preparation: Patient was prepped and draped in the usual sterile fashion. Local anesthesia used: no  Anesthesia: Local anesthesia used: no  Sedation: Patient sedated: yes Sedatives: propofol and ketamine Vitals: Vital signs were monitored during sedation.  Patient tolerance: patient tolerated the procedure well with no immediate complications    (including critical care time)  Medications Ordered in ED Medications  ketamine 50 mg in normal saline 5 mL (10 mg/mL) syringe (40 mg Intravenous Not Given 05/21/19 0831)   propofol (DIPRIVAN) 10 mg/mL bolus/IV push 39.7 mg (39.7 mg Intravenous Not Given 05/21/19 0830)  ketamine (KETALAR) injection (10 mg Intravenous Given 05/21/19 0817)  propofol (DIPRIVAN) 10 mg/mL bolus/IV push (20 mg Intravenous Given 05/21/19 [id number])    ED Course  I have reviewed the triage vital signs and the nursing notes.  Pertinent labs & imaging results that were available during my care of the patient were reviewed by me and considered in my medical decision making (see chart for details).  MDM Rules/Calculators/A&P                      Patient's hip dislocation was successfully reduced under sedation in the ED.  He was monitored and had a successful return to his baseline status.  He is scheduled to have surgery for definitive repair on March 23.  He was placed in a knee immobilizer and advised to use this at all times.  He does say that he has bad teeth and they recently been flaring up and that he generally needs to take penicillin prior to surgery.  He was given prescription for amoxicillin. Final Clinical Impression(s) / ED Diagnoses Final diagnoses:  Dislocation of right hip, initial encounter [facility])    Rx / DC Orders ED Discharge Orders         Ordered    amoxicillin (AMOXIL) 500 MG capsule  3 times daily     05/21/19 0859           Rolan Bucco, MD 05/21/19 [phone]

## 2019-05-21 NOTE — ED Triage Notes (Signed)
Assisted patient getting out of car-states his right hip has popped out again-suppose to have surgery on the 23rd

## 2019-05-22 ENCOUNTER — Encounter (HOSPITAL_COMMUNITY)
Admission: EM | Discharge status: Against Medical Advice | Payer: Self-pay | Source: Home / Self Care | Attending: Emergency Medicine

## 2019-05-22 ENCOUNTER — Emergency Department (HOSPITAL_COMMUNITY): Payer: Medicare Other

## 2019-05-22 ENCOUNTER — Other Ambulatory Visit: Payer: Self-pay

## 2019-05-22 ENCOUNTER — Ambulatory Visit (HOSPITAL_COMMUNITY)
Admission: EM | Admit: 2019-05-22 | Discharge: 2019-05-22 | Discharge status: Against Medical Advice | Payer: Medicare Other | Attending: Orthopedic Surgery | Admitting: Orthopedic Surgery

## 2019-05-22 ENCOUNTER — Encounter (HOSPITAL_COMMUNITY): Payer: Self-pay

## 2019-05-22 ENCOUNTER — Emergency Department (HOSPITAL_COMMUNITY): Payer: Medicare Other | Admitting: Certified Registered"

## 2019-05-22 DIAGNOSIS — K219 Gastro-esophageal reflux disease without esophagitis: Secondary | ICD-10-CM | POA: Insufficient documentation

## 2019-05-22 DIAGNOSIS — D649 Anemia, unspecified: Secondary | ICD-10-CM | POA: Insufficient documentation

## 2019-05-22 DIAGNOSIS — Z79899 Other long term (current) drug therapy: Secondary | ICD-10-CM | POA: Diagnosis not present

## 2019-05-22 DIAGNOSIS — Z833 Family history of diabetes mellitus: Secondary | ICD-10-CM | POA: Insufficient documentation

## 2019-05-22 DIAGNOSIS — S73034A Other anterior dislocation of right hip, initial encounter: Secondary | ICD-10-CM | POA: Diagnosis present

## 2019-05-22 DIAGNOSIS — Y792 Prosthetic and other implants, materials and accessory orthopedic devices associated with adverse incidents: Secondary | ICD-10-CM | POA: Insufficient documentation

## 2019-05-22 DIAGNOSIS — Z20822 Contact with and (suspected) exposure to covid-19: Secondary | ICD-10-CM | POA: Diagnosis not present

## 2019-05-22 DIAGNOSIS — K449 Diaphragmatic hernia without obstruction or gangrene: Secondary | ICD-10-CM | POA: Diagnosis not present

## 2019-05-22 DIAGNOSIS — I10 Essential (primary) hypertension: Secondary | ICD-10-CM | POA: Insufficient documentation

## 2019-05-22 DIAGNOSIS — Z9889 Other specified postprocedural states: Secondary | ICD-10-CM

## 2019-05-22 DIAGNOSIS — Z888 Allergy status to other drugs, medicaments and biological substances status: Secondary | ICD-10-CM | POA: Insufficient documentation

## 2019-05-22 DIAGNOSIS — T84020A Dislocation of internal right hip prosthesis, initial encounter: Secondary | ICD-10-CM | POA: Diagnosis not present

## 2019-05-22 DIAGNOSIS — F419 Anxiety disorder, unspecified: Secondary | ICD-10-CM | POA: Diagnosis not present

## 2019-05-22 DIAGNOSIS — F329 Major depressive disorder, single episode, unspecified: Secondary | ICD-10-CM | POA: Diagnosis not present

## 2019-05-22 DIAGNOSIS — Z419 Encounter for procedure for purposes other than remedying health state, unspecified: Secondary | ICD-10-CM

## 2019-05-22 DIAGNOSIS — S73004A Unspecified dislocation of right hip, initial encounter: Secondary | ICD-10-CM | POA: Diagnosis present

## 2019-05-22 HISTORY — PX: HIP CLOSED REDUCTION: SHX983

## 2019-05-22 LAB — CBC
HCT: 40.6 % (ref 39.0–52.0)
Hemoglobin: 13.3 g/dL (ref 13.0–17.0)
MCH: 29 pg (ref 26.0–34.0)
MCHC: 32.8 g/dL (ref 30.0–36.0)
MCV: 88.6 fL (ref 80.0–100.0)
Platelets: 268 10*3/uL (ref 150–400)
RBC: 4.58 MIL/uL (ref 4.22–5.81)
RDW: 13.8 % (ref 11.5–15.5)
WBC: 9.1 10*3/uL (ref 4.0–10.5)
nRBC: 0 % (ref 0.0–0.2)

## 2019-05-22 LAB — BASIC METABOLIC PANEL
Anion gap: 9 (ref 5–15)
BUN: 17 mg/dL (ref 6–20)
CO2: 28 mmol/L (ref 22–32)
Calcium: 8.9 mg/dL (ref 8.9–10.3)
Chloride: 102 mmol/L (ref 98–111)
Creatinine, Ser: 0.89 mg/dL (ref 0.61–1.24)
GFR calc Af Amer: >60 mL/min (ref >60)
GFR calc non Af Amer: >60 mL/min (ref >60)
Glucose, Bld: 106 mg/dL — ABNORMAL HIGH (ref 70–99)
Potassium: 3.6 mmol/L (ref 3.5–5.1)
Sodium: 139 mmol/L (ref 135–145)

## 2019-05-22 LAB — RESPIRATORY PANEL BY RT PCR (FLU A&B, COVID)
Influenza A by PCR: NEGATIVE
Influenza B by PCR: NEGATIVE
SARS Coronavirus 2 by RT PCR: NEGATIVE

## 2019-05-22 SURGERY — CLOSED MANIPULATION, JOINT, HIP
Anesthesia: General | Site: Hip | Laterality: Right

## 2019-05-22 MED ORDER — PROPOFOL 10 MG/ML IV BOLUS
INTRAVENOUS | Status: AC
  Filled 2019-05-22: qty 40

## 2019-05-22 MED ORDER — ONDANSETRON HCL 4 MG/2ML IJ SOLN
INTRAMUSCULAR | Status: DC | PRN
  Administered 2019-05-22: 4 mg via INTRAVENOUS

## 2019-05-22 MED ORDER — LACTATED RINGERS IV SOLN: INTRAVENOUS | Status: DC | PRN

## 2019-05-22 MED ORDER — HYDROMORPHONE HCL 1 MG/ML IJ SOLN
1.0000 mg | Freq: Once | INTRAMUSCULAR | Status: AC
  Administered 2019-05-22: 1 mg via INTRAVENOUS
  Filled 2019-05-22: qty 1

## 2019-05-22 MED ORDER — PROPOFOL 10 MG/ML IV BOLUS
0.5000 mg/kg | Freq: Once | INTRAVENOUS | Status: AC
  Administered 2019-05-22: 17:00:00 39.7 mg via INTRAVENOUS
  Filled 2019-05-22: qty 20

## 2019-05-22 MED ORDER — MIDAZOLAM HCL 2 MG/2ML IJ SOLN
INTRAMUSCULAR | Status: AC
  Filled 2019-05-22: qty 2

## 2019-05-22 MED ORDER — PROPOFOL 10 MG/ML IV BOLUS
INTRAVENOUS | Status: DC | PRN
  Administered 2019-05-22: 200 mg via INTRAVENOUS

## 2019-05-22 MED ORDER — LIDOCAINE 2% (20 MG/ML) 5 ML SYRINGE
INTRAMUSCULAR | Status: DC | PRN
  Administered 2019-05-22: 60 mg via INTRAVENOUS

## 2019-05-22 MED ORDER — FENTANYL CITRATE (PF) 250 MCG/5ML IJ SOLN: INTRAMUSCULAR | Status: DC | PRN

## 2019-05-22 MED ORDER — FENTANYL CITRATE (PF) 100 MCG/2ML IJ SOLN
INTRAMUSCULAR | Status: DC | PRN
  Administered 2019-05-22: 100 ug via INTRAVENOUS

## 2019-05-22 MED ORDER — KETAMINE HCL 50 MG/5ML IJ SOSY
1.0000 mg/kg | PREFILLED_SYRINGE | Freq: Once | INTRAMUSCULAR | Status: AC
  Administered 2019-05-22: 50 mg via INTRAVENOUS
  Filled 2019-05-22: qty 10

## 2019-05-22 MED ORDER — MORPHINE SULFATE (PF) 4 MG/ML IV SOLN
4.0000 mg | Freq: Once | INTRAVENOUS | Status: AC
  Administered 2019-05-22: 4 mg via INTRAVENOUS
  Filled 2019-05-22: qty 1

## 2019-05-22 MED ORDER — FENTANYL CITRATE (PF) 100 MCG/2ML IJ SOLN
INTRAMUSCULAR | Status: AC
  Filled 2019-05-22: qty 2

## 2019-05-22 MED ORDER — FENTANYL CITRATE (PF) 100 MCG/2ML IJ SOLN
50.0000 ug | Freq: Once | INTRAMUSCULAR | Status: AC
  Administered 2019-05-22: 50 ug via INTRAVENOUS
  Filled 2019-05-22: qty 2

## 2019-05-22 MED ORDER — FENTANYL CITRATE (PF) 100 MCG/2ML IJ SOLN: 100.0000 ug | Freq: Once | INTRAMUSCULAR | Status: DC

## 2019-05-22 MED ORDER — MIDAZOLAM HCL 2 MG/2ML IJ SOLN
INTRAMUSCULAR | Status: DC | PRN
  Administered 2019-05-22: 2 mg via INTRAVENOUS

## 2019-05-22 MED ORDER — SUCCINYLCHOLINE CHLORIDE 200 MG/10ML IV SOSY
PREFILLED_SYRINGE | INTRAVENOUS | Status: DC | PRN
  Administered 2019-05-22: 60 mg via INTRAVENOUS

## 2019-05-22 SURGICAL SUPPLY — 1 items: IMMOBILIZER KNEE 16 UNIV (MISCELLANEOUS) ×2 IMPLANT

## 2019-05-22 NOTE — Transfer of Care (Signed)
Immediate Anesthesia Transfer of Care Note  Patient: Todd [name]  Procedure(s) Performed: CLOSED MANIPULATION HIP (Right Hip)  Patient Location: PACU  Anesthesia Type:General  Level of Consciousness: sedated  Airway & Oxygen Therapy: Patient Spontanous Breathing and Patient connected to face mask oxygen  Post-op Assessment: Report given to RN and Post -op Vital signs reviewed and stable  Post vital signs: Reviewed and stable  Last Vitals:  Vitals Value Taken Time  BP 137/80 05/22/19 2112  Temp    Pulse 72 05/22/19 2114  Resp 12 05/22/19 2114  SpO2 100 % 05/22/19 2114  Vitals shown include unvalidated device data.  Last Pain:  Vitals:   05/22/19 1632  TempSrc: Oral  PainSc:          Complications: No apparent anesthesia complications

## 2019-05-22 NOTE — Sedation Documentation (Signed)
Another 20 mg propofol given per verbal from Estell Harpin, MD

## 2019-05-22 NOTE — H&P (Signed)
H&P  Chief Complaint: right hip pain  HPI: Todd [name] is a 49 y.o. male who presented to ED with recurrent hip dislocations. History of right hip replacement and subsequent revision in 2018. He is followed by Dr. Rolley Sims at [facility] and is pending another right hip revision, currently scheduled for 3/23. He went to bed at 3 am this morning and when he woke up, his right hip had dislocated Patient reports no other injuries. Patient is seated comfortably in bed. He states pain is mainly located in his hip and is worse with movement, better with rest. Denies numbness or tingling. Denies chest pain, shortness of breath, nausea, or vomiting.   Past Medical History:  Diagnosis Date  . Drug addiction ([facility])   . 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 GERD (gastroesophageal reflux disease)   . H/O hiatal hernia   . Hypertension    Past Surgical History:  Procedure Laterality Date  . APPENDECTOMY     as child  . COLONOSCOPY WITH PROPOFOL  02/17/2012   Procedure: COLONOSCOPY WITH PROPOFOL;  Surgeon: Willis Modena, MD;  [facility];  Service: Endoscopy;  Laterality: N/A;  . ESOPHAGOGASTRODUODENOSCOPY Left 09/19/2013   Procedure: ESOPHAGOGASTRODUODENOSCOPY (EGD);  Surgeon: Willis Modena, MD;  Location: [facility] ENDOSCOPY;  Service: Endoscopy;  Laterality: Left;  . ESOPHAGOGASTRODUODENOSCOPY (EGD) WITH PROPOFOL  02/17/2012   Procedure: ESOPHAGOGASTRODUODENOSCOPY (EGD) WITH PROPOFOL;  Surgeon: Willis Modena, MD;  [facility];  Service: Endoscopy;  Laterality: N/A;  . HIP CLOSED REDUCTION Right 10/20/2013   Procedure: CLOSED REDUCTION HIP;  Surgeon: Eldred Manges, MD;  Location: [facility] OR;  Service: Orthopedics;  Laterality: Right;  . HIP CLOSED REDUCTION Right 02/18/2015   Procedure: CLOSED REDUCTION HIP;  Surgeon: Jodi Geralds, MD;  [facility];  Service: Orthopedics;  Laterality: Right;  . HIP CLOSED REDUCTION Right 03/16/2015   Procedure:  CLOSED REDUCTION HIP;  Surgeon: Kathryne Hitch, MD;  Location: [facility] OR;  Service: Orthopedics;  Laterality: Right;  . HIP CLOSED REDUCTION Right 04/29/2019   Procedure: CLOSED MANIPULATION HIP;  Surgeon: Teryl Lucy, MD;  Location: [facility];  Service: Orthopedics;  Laterality: Right;  . HIP CLOSED REDUCTION Right 05/14/2019   Procedure: CLOSED REDUCTION HIP;  Surgeon: Deeann Saint, MD;  Location: [facility] ORS;  Service: Orthopedics;  Laterality: Right;  . JOINT REPLACEMENT  2005   Right Hip   Social History   Socioeconomic History  . Marital status: Married    Spouse name: Not on file  . Number of children: Not on file  . Years of education: Not on file  . Highest education level: Not on file  Occupational History  . Not on file  Tobacco Use  . Smoking status: Never Smoker  . Smokeless tobacco: Never Used  Substance and Sexual Activity  . Alcohol use: No  . Drug use: Not Currently    Types: IV  . Sexual activity: Yes  Other Topics Concern  . Not on file  Social History Narrative  . Not on file   Social Determinants of Health   Financial Resource Strain:   . Difficulty of Paying Living Expenses:   Food Insecurity:   . Worried About Programme researcher, broadcasting/film/video in the Last Year:   . Barista in the Last Year:   Transportation Needs:   . Freight forwarder (Medical):   Marland Kitchen Lack of Transportation (Non-Medical):   Physical Activity:   . Days of Exercise  per Week:   . Minutes of Exercise per Session:   Stress:   . Feeling of Stress :   Social Connections:   . Frequency of Communication with Friends and Family:   . Frequency of Social Gatherings with Friends and Family:   . Attends Religious Services:   . Active Member of Clubs or Organizations:   . Attends Archivist Meetings:   Marland Kitchen Marital Status:    Family History  Problem Relation Age of Onset  . Diabetes Mother    Allergies  Allergen Reactions  . Ambien [Zolpidem Tartrate] Other (See Comments)     Sedation   . Nu-Iron [Polysaccharide Iron Complex] Diarrhea  . Fentanyl Other (See Comments)    Patches caused headache   Prior to Admission medications   Medication Sig Start Date End Date Taking? Authorizing Provider  amoxicillin (AMOXIL) 500 MG capsule Take 1 capsule (500 mg total) by mouth 3 (three) times daily. 05/21/19  Yes Malvin Johns, MD  busPIRone (BUSPAR) 10 MG tablet Take 10 mg by mouth 3 (three) times daily. 03/20/19  Yes [provider]  dexmethylphenidate (FOCALIN) 10 MG tablet Take 10 mg by mouth 2 (two) times daily. 04/07/19  Yes [provider]  FLUoxetine (PROZAC) 40 MG capsule Take 40 mg by mouth daily.   Yes [provider]  loratadine (CLARITIN) 10 MG tablet Take 10 mg by mouth daily.    Yes [provider]  metoprolol (LOPRESSOR) 50 MG tablet Take 50 mg by mouth 2 (two) times daily.    Yes [provider]  Multiple Vitamins-Minerals (MENS MULTIVITAMIN) TABS Take 1 tablet by mouth daily.   Yes [provider]  ondansetron (ZOFRAN) 4 MG tablet Take 4 mg by mouth every 8 (eight) hours as needed for nausea or vomiting.  11/26/18  Yes [provider]  oxyCODONE (ROXICODONE) 15 MG immediate release tablet Take 15 mg by mouth every 6 (six) hours.  01/13/19  Yes [provider]  oxymetazoline (AFRIN) 0.05 % nasal spray Place 1 spray into both nostrils 2 (two) times daily as needed for congestion.   Yes [provider]  pantoprazole (PROTONIX) 40 MG tablet Take 40 mg by mouth 2 (two) times daily.  03/22/19  Yes [provider]  promethazine (PHENERGAN) 25 MG tablet Take 25 mg by mouth at bedtime as needed for nausea/vomiting. 03/20/19  Yes [provider]  testosterone cypionate (DEPOTESTOSTERONE CYPIONATE) 200 MG/ML injection Inject 200 mg into the muscle every 14 (fourteen) days.  11/11/18  Yes [provider]  tiZANidine (ZANAFLEX) 4 MG tablet Take 4 mg by mouth every 6 (six)  hours.    Yes [provider]  triamcinolone ointment (KENALOG) 0.1 % Apply 1 application topically 2 (two) times daily as needed (psoriasis).   Yes [provider]     Positive ROS: All other systems have been reviewed and were otherwise negative with the exception of those mentioned in the HPI and as above.  Physical Exam: General: Alert, no acute distress Cardiovascular: No pedal edema Respiratory: No cyanosis, no use of accessory musculature GI: No organomegaly, abdomen is soft and non-tender Skin: No lesions in the area of chief complaint Neurologic: Sensation intact distally Psychiatric: Patient is competent for consent with normal mood and affect Lymphatic: No axillary or cervical lymphadenopathy  MUSCULOSKELETAL: RLE shortened and internally rotated. TTP to right buttock. Distal sensation is intact. Full ROM of right ankle. 2+ DP pulse in right foot. Able to move all toes  of right foot.   Assessment: Right hip dislocation   Plan: Plan for Procedure(s): CLOSED MANIPULATION HIP - Covid test confirmed negative - Plan for closed reduction in OR with Dr. Dion Saucier this evening - prn pain medication - plan for follow-up at [facility] with orthopedist, revision scheduled for 3/23   Armida Sans, PA-C    05/22/2019 8:36 PM

## 2019-05-22 NOTE — Anesthesia Preprocedure Evaluation (Addendum)
Anesthesia Evaluation  Patient identified by MRN, date of birth, ID band Patient awake    Reviewed: Allergy & Precautions, NPO status , Patient's Chart, lab work & pertinent test results, reviewed documented beta blocker date and time   Airway Mallampati: II  TM Distance: >3 FB Neck ROM: Full    Dental no notable dental hx. (+) Dental Advisory Given, Poor Dentition,    Pulmonary neg pulmonary ROS,    Pulmonary exam normal breath sounds clear to auscultation       Cardiovascular hypertension, Pt. on home beta blockers and Pt. on medications negative cardio ROS Normal cardiovascular exam Rhythm:Regular Rate:Normal     Neuro/Psych PSYCHIATRIC DISORDERS Anxiety Depression negative neurological ROS     GI/Hepatic hiatal hernia, GERD  ,(+)     substance abuse  IV drug use,   Endo/Other  negative endocrine ROS  Renal/GU negative Renal ROS  negative genitourinary   Musculoskeletal negative musculoskeletal ROS (+)   Abdominal   Peds  Hematology negative hematology ROS (+)   Anesthesia Other Findings Dislocation of hip prosthesis  Reproductive/Obstetrics                          Anesthesia Physical Anesthesia Plan  ASA: II and emergent  Anesthesia Plan: General   Post-op Pain Management:    Induction: Intravenous  PONV Risk Score and Plan: 2 and Ondansetron and Midazolam  Airway Management Planned: LMA  Additional Equipment:   Intra-op Plan:   Post-operative Plan: Extubation in OR  Informed Consent: I have reviewed the patients History and Physical, chart, labs and discussed the procedure including the risks, benefits and alternatives for the proposed anesthesia with the patient or authorized representative who has indicated his/her understanding and acceptance.     Dental advisory given  Plan Discussed with: CRNA  Anesthesia Plan Comments:        Anesthesia Quick  Evaluation

## 2019-05-22 NOTE — ED Notes (Signed)
Pt screaming that he needs his pain medicine now that his IV has been placed. Writer asked pt to stop screaming but pt continued. Charge RN Taquita aware the pt is demanding pain medicine.

## 2019-05-22 NOTE — Sedation Documentation (Signed)
20 mg more of propofol given per verbal from Estell Harpin, MD.

## 2019-05-22 NOTE — Anesthesia Procedure Notes (Signed)
Date/Time: 05/22/2019 9:07 PM Performed by: Minerva Ends, CRNA Oxygen Delivery Method: Simple face mask Placement Confirmation: positive ETCO2 and breath sounds checked- equal and bilateral Dental Injury: Teeth and Oropharynx as per pre-operative assessment

## 2019-05-22 NOTE — Sedation Documentation (Signed)
Verbal for 20 mg IV more of propofol per Estell Harpin, MD. Total of 120 mg propofol given.

## 2019-05-22 NOTE — ED Notes (Signed)
Unsuccessful IV attempt x2.  

## 2019-05-22 NOTE — ED Provider Notes (Signed)
  Physical Exam  BP (!) 157/95 (BP Location: Right Arm)   Pulse 93   Temp 98.3 F (36.8 C)   Resp 17   Ht 6\' 1"  (1.854 m)   Wt 79.4 kg   SpO2 98%   BMI 23.09 kg/m   Physical Exam  ED Course/Procedures     .Sedation  Date/Time: 05/22/2019 11:10 PM Performed by: 05/24/2019, MD Authorized by: Bethann Berkshire, MD   Consent:    Consent obtained:  Verbal   Consent given by:  Patient   Risks discussed:  Allergic reaction, dysrhythmia, inadequate sedation, nausea, prolonged hypoxia resulting in organ damage, prolonged sedation necessitating reversal, respiratory compromise necessitating ventilatory assistance and intubation and vomiting   Alternatives discussed:  Analgesia without sedation, anxiolysis and regional anesthesia Universal protocol:    Procedure explained and questions answered to patient or proxy's satisfaction: yes     Relevant documents present and verified: yes     Test results available and properly labeled: yes     Imaging studies available: yes     Required blood products, implants, devices, and special equipment available: yes     Site/side marked: yes     Immediately prior to procedure a time out was called: yes     Patient identity confirmation method:  Verbally with patient Indications:    Procedure necessitating sedation performed by:  Physician performing sedation Pre-sedation assessment:    Time since last food or drink:  Unknown   ASA classification: class 1 - normal, healthy patient     Neck mobility: normal     Mouth opening:  3 or more finger widths   Thyromental distance:  4 finger widths   Mallampati score:  I - soft palate, uvula, fauces, pillars visible   Pre-sedation assessments completed and reviewed: airway patency, cardiovascular function, hydration status, mental status, nausea/vomiting, pain level, respiratory function and temperature   Immediate pre-procedure details:    Reassessment: Patient reassessed immediately prior to procedure      Reviewed: vital signs, relevant labs/tests and NPO status     Verified: bag valve mask available, emergency equipment available, intubation equipment available, IV patency confirmed, oxygen available and suction available   Procedure details (see MAR for exact dosages):    Preoxygenation:  Nasal cannula   Sedation:  Propofol   Intended level of sedation: deep   Intra-procedure monitoring:  Blood pressure monitoring, cardiac monitor, continuous pulse oximetry, frequent LOC assessments, frequent vital sign checks and continuous capnometry   Intra-procedure events: none     Total Provider sedation time (minutes):  45 Post-procedure details:    Attendance: Constant attendance by certified staff until patient recovered     Recovery: Patient returned to pre-procedure baseline     Post-sedation assessments completed and reviewed: airway patency, cardiovascular function, hydration status, mental status, nausea/vomiting, pain level, respiratory function and temperature     Patient is stable for discharge or admission: yes     Patient tolerance:  Tolerated well, no immediate complications Comments:     Patient was given propofol and ketamine for sedation of hip reduction.    MDM        Bethann Berkshire, MD 05/22/19 2312

## 2019-05-22 NOTE — Progress Notes (Signed)
Patient transported from PACU to ED entrance via wheelchair to wait for son who stated he would be here in just a few minutes; upon arriving to ED entrance, after about 5 minutes, patient states "My son said he got sidetracked and that he's going to be awhile.  I want to go home.  I can either walk out right now or sign AMA."  Writer advised patient against this and was warned that it isn't recommended and that it is dangerous considering the medications he was administered.  Pt stated, "Wendall Stade yeh, I know.  I've been through this several times.  Since you're nice, I'll sign a paper."  No AMA paper found in ED for patient to sign, so patient hastily gave his verbal agreement to sign out AMA and left.  Vitals were stable during the course of the PACU stay, patient ate a ham sandwich, graham crackers with peanut butter, saltine crackers and drank 480cc of apple juice.  Was able to dress self and walk to restroom independently, and able to have lucid/appropriate conversations.  Upon giving verbal agreement to sign out AMA, patient walked out the Emergency entrance door.

## 2019-05-22 NOTE — Sedation Documentation (Signed)
Unable to reduce right hip at this time.

## 2019-05-22 NOTE — Op Note (Signed)
05/22/2019  9:03 PM  PATIENT:  Todd [name]    PRE-OPERATIVE DIAGNOSIS:  dislocated right prosthetic hip  POST-OPERATIVE DIAGNOSIS:  Same  PROCEDURE: Closed reduction, right prosthetic hip dislocation  SURGEON:  Eulas Post, MD  PHYSICIAN ASSISTANT: Janine Ores, PA-C, present and scrubbed throughout the case, critical for completion in a timely fashion, and for retraction, instrumentation, and closure.  ANESTHESIA:   General  PREOPERATIVE INDICATIONS:  SILVERIO HAGAN is a  49 y.o. male with a diagnosis of dislocated right hip who failed conservative measures and elected for surgical management.    The risks benefits and alternatives were discussed with the patient preoperatively including but not limited to the risks of infection, bleeding, nerve injury, cardiopulmonary complications, the need for revision surgery, among others, and the patient was willing to proceed.  He is scheduled for revision hip surgery later this month with his primary orthopedist.  ESTIMATED BLOOD LOSS: None  OPERATIVE IMPLANTS: None  OPERATIVE FINDINGS: The hip was fairly difficult to reduce.  OPERATIVE PROCEDURE: The patient was brought to the operating room and placed in supine position.  General anesthesia was administered.  Multiple manipulation attempts were performed, ultimately I did have to stand on the bed, rotate the hip, and ultimately was able to achieve a satisfactory reduction.  I tested the motion, and I went from 0 to 90 degrees, and the hip remained in the socket.  C-arm was used to confirm position.  A knee immobilizer was placed, and the patient was awakened and returned to the PACU in stable and satisfactory condition.  I had a long discussion with the patient preoperatively about being compliant with the use of the knee immobilizer.  He says that in fact he has been compliant, but sometimes the knee immobilizer bars tend to begin his skin and cause ulceration, so he was removing the  knee immobilizer for this reason.  I counseled him that he in fact needs to maintain the position of the knee immobilizer at all times, and to be very careful between now and his operation, which is apparently 8 days from now.  He has had 2 dislocations in 48 hours, and if he is not able to remain located then he will likely need transfer to [facility] for direct surgical care.  In the meantime we will discharge him from the recovery room with a knee immobilizer, with follow-up revision surgery as scheduled.

## 2019-05-22 NOTE — Anesthesia Procedure Notes (Signed)
Procedure Name: LMA Insertion Date/Time: 05/22/2019 8:50 PM Performed by: Minerva Ends, CRNA Pre-anesthesia Checklist: Patient identified, Emergency Drugs available, Suction available and Patient being monitored Patient Re-evaluated:Patient Re-evaluated prior to induction Oxygen Delivery Method: Circle System Utilized Preoxygenation: Pre-oxygenation with 100% oxygen Induction Type: IV induction Ventilation: Mask ventilation without difficulty LMA: LMA inserted LMA Size: 5.0 Number of attempts: 1 Placement Confirmation: positive ETCO2 Tube secured with: Tape Dental Injury: Teeth and Oropharynx as per pre-operative assessment  Comments: Smooth induction Wood[place]-- LMA inserted AM CRNA atraumatic-- teeth and mouth as preop-- many missing chipped- very poor dentition- bilat bs

## 2019-05-22 NOTE — ED Triage Notes (Signed)
Patient states he was at home today and his right hip is out of place again. Patient states he was seen yesterday for the same.

## 2019-05-22 NOTE — Discharge Summary (Signed)
Discharge Summary  Patient ID: Todd [name] MRN: [id number] DOB/AGE: 49-Jan-1972 49 y.o.  Admit date: 05/22/2019 Discharge date: 05/22/2019  Admission Diagnoses:  Right hip dislocation  Discharge Diagnoses:  Right hip dislocation s/p closed reduction  Past Medical History:  Diagnosis Date  . Drug addiction ([place])   . 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 GERD (gastroesophageal reflux disease)   . H/O hiatal hernia   . Hypertension     Surgeries: Procedure(s): CLOSED MANIPULATION HIP on 05/22/2019   Consultants (if any): Treatment Team:  Marchia Bond, MD  Discharged Condition: Improved  Hospital Course: Todd [name] is an 49 y.o. male who was admitted 05/22/2019 with a diagnosis of right hip dislocation and went to the operating room on 05/22/2019 and underwent the above named procedures.    He was given perioperative antibiotics:  Anti-infectives (From admission, onward)   None    .  He was given sequential compression devices and early ambulation for DVT prophylaxis.  He benefited maximally from the hospital stay and there were no complications.    Recent vital signs:  Vitals:   05/22/19 1745 05/22/19 1821  BP: (!) 165/98 (!) 147/95  Pulse: 84 90  Resp:  15  Temp:    SpO2: 99% 98%    Recent laboratory studies:  Lab Results  Component Value Date   HGB 13.3 05/22/2019   HGB 14.9 05/14/2019   HGB 13.1 04/29/2019   Lab Results  Component Value Date   WBC 9.1 05/22/2019   PLT 268 05/22/2019   Lab Results  Component Value Date   INR 0.95 09/19/2013   Lab Results  Component Value Date   NA 139 05/22/2019   K 3.6 05/22/2019   CL 102 05/22/2019   CO2 28 05/22/2019   BUN 17 05/22/2019   CREATININE 0.89 05/22/2019   GLUCOSE 106 (H) 05/22/2019    Discharge Medications:   Allergies as of 05/22/2019      Reactions   Ambien [zolpidem Tartrate] Other (See Comments)   Sedation    Nu-iron [polysaccharide Iron  Complex] Diarrhea   Fentanyl Other (See Comments)   Patches caused headache      Medication List    TAKE these medications   amoxicillin 500 MG capsule Commonly known as: AMOXIL Take 1 capsule (500 mg total) by mouth 3 (three) times daily.   busPIRone 10 MG tablet Commonly known as: BUSPAR Take 10 mg by mouth 3 (three) times daily.   dexmethylphenidate 10 MG tablet Commonly known as: FOCALIN Take 10 mg by mouth 2 (two) times daily.   FLUoxetine 40 MG capsule Commonly known as: PROZAC Take 40 mg by mouth daily.   loratadine 10 MG tablet Commonly known as: CLARITIN Take 10 mg by mouth daily.   Mens Multivitamin Tabs Take 1 tablet by mouth daily.   metoprolol tartrate 50 MG tablet Commonly known as: LOPRESSOR Take 50 mg by mouth 2 (two) times daily.   ondansetron 4 MG tablet Commonly known as: ZOFRAN Take 4 mg by mouth every 8 (eight) hours as needed for nausea or vomiting.   oxyCODONE 15 MG immediate release tablet Commonly known as: ROXICODONE Take 15 mg by mouth every 6 (six) hours.   oxymetazoline 0.05 % nasal spray Commonly known as: AFRIN Place 1 spray into both nostrils 2 (two) times daily as needed for congestion.   pantoprazole 40 MG tablet Commonly known as: PROTONIX Take 40 mg by mouth 2 (two)  times daily.   promethazine 25 MG tablet Commonly known as: PHENERGAN Take 25 mg by mouth at bedtime as needed for nausea/vomiting.   testosterone cypionate 200 MG/ML injection Commonly known as: DEPOTESTOSTERONE CYPIONATE Inject 200 mg into the muscle every 14 (fourteen) days.   tiZANidine 4 MG tablet Commonly known as: ZANAFLEX Take 4 mg by mouth every 6 (six) hours.   triamcinolone ointment 0.1 % Commonly known as: KENALOG Apply 1 application topically 2 (two) times daily as needed (psoriasis).       Diagnostic Studies: DG Pelvis Portable  Result Date: 05/22/2019 CLINICAL DATA:  Right hip dislocation. EXAM: PORTABLE PELVIS 1-2 VIEWS COMPARISON:   Earlier this day, 2 hours prior. FINDINGS: Right hip arthroplasty with persistent superior dislocation of the femoral component with respect to the acetabular component. Unchanged alignment. No visualized fracture. IMPRESSION: Right hip arthroplasty with persistent superior dislocation of the femoral component. Electronically Signed   By: Todd Rutherford M.D.   On: 05/22/2019 18:29   DG Pelvis Portable  Result Date: 05/14/2019 CLINICAL DATA:  49 year old male status post attempted reduction of prosthetic hip dislocation. EXAM: PORTABLE PELVIS 1-2 VIEWS COMPARISON:  1743 hours today. FINDINGS: Portable AP supine view at 1934 hours. Unchanged right side prosthetic hip dislocation from 1743 hours. Stable visible hardware. No new No acute osseous abnormality identified. IMPRESSION: Stable right prosthetic hip dislocation from earlier. Electronically Signed   By: Todd Fleming M.D.   On: 05/14/2019 19:50   DG C-Arm 1-60 Min-No Report  Result Date: 04/29/2019 Fluoroscopy was utilized by the requesting physician.  No radiographic interpretation.   DG HIP PORT UNILAT WITH PELVIS 1V RIGHT  Result Date: 05/21/2019 CLINICAL DATA:  Post right hip reduction EXAM: DG HIP (WITH OR WITHOUT PELVIS) 1V PORT RIGHT COMPARISON:  05/21/2019 at 0704 hours FINDINGS: Interval reduction of the right hip arthroplasty. No fracture is seen. 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 Left hip joint space is preserved. Visualized bony pelvis appears intact. IMPRESSION: Interval reduction of the right hip arthroplasty. 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 Electronically Signed   By: Todd Bills M.D.   On: 05/21/2019 08:45   DG Hip Port [place] W or Wo Pelvis 1 View Right  Result Date: 05/01/2019 CLINICAL DATA:  Hip dislocation, postreduction. EXAM: DG HIP (WITH OR WITHOUT PELVIS) 1V PORT RIGHT COMPARISON:  Radiographs earlier this day. FINDINGS: Femoral component of the right hip  arthroplasty is now seated in the acetabular component. Femoral shaft with cerclage wires is intact were visualized. Osseous fragment lateral to the acetabulum is chronic. No evidence of acute fracture. IMPRESSION: Reduction of right hip arthroplasty dislocation. Electronically Signed   By: Todd Rutherford M.D.   On: 05/01/2019 18:29   DG HIP OPERATIVE UNILAT W OR W/O PELVIS RIGHT  Result Date: 05/14/2019 CLINICAL DATA:  Hip reduction EXAM: OPERATIVE RIGHT HIP (WITH PELVIS IF PERFORMED) 2 VIEWS TECHNIQUE: Fluoroscopic spot image(s) were submitted for interpretation post-operatively. COMPARISON:  May 14, 2019 FINDINGS: Patient has undergone successful reduction of the previously demonstrated right hip dislocation. The hardware appears grossly intact where visualized. There is no obvious displaced fracture. The total fluoroscopy time was 2 seconds. IMPRESSION: Successful reduction of the previously demonstrated right hip dislocation. Electronically Signed   By: Katherine Mantle M.D.   On: 05/14/2019 22:16   DG HIP OPERATIVE UNILAT WITH PELVIS RIGHT  Result Date: 04/29/2019 CLINICAL DATA:  Closed reduction of right hip dislocation EXAM: OPERATIVE RIGHT HIP WITH PELVIS COMPARISON:  04/29/2019 right hip radiographs FLUOROSCOPY TIME:  Fluoroscopy Time:  0 minutes 1 second Radiation Exposure Index (if provided by the fluoroscopic device): 0.11 mGy Number of Acquired Spot Images: 1 FINDINGS: Spot fluoroscopic intraoperative right hip radiograph demonstrates right total hip arthroplasty with no residual malalignment in the right hip on this single frontal view. IMPRESSION: Intraoperative fluoroscopic guidance for closed reduction of right total hip arthroplasty dislocation. Electronically Signed   By: Delbert Phenix M.D.   On: 04/29/2019 10:55   DG Hip Unilat W or Wo Pelvis 2-3 Views Right  Result Date: 05/22/2019 CLINICAL DATA:  Dislocation. EXAM: DG HIP (WITH OR WITHOUT PELVIS) 2-3V RIGHT COMPARISON:   Radiograph yesterday. FINDINGS: Recurrent superior dislocation of the right femoral arthroplasty with femoral head component displaced superiorly with respect to the acetabular component. No evidence of acute fracture. Exam is otherwise unchanged. IMPRESSION: Recurrent right hip arthroplasty dislocation with superior displacement of the femoral head component. Electronically Signed   By: Todd Rutherford M.D.   On: 05/22/2019 15:55   DG Hip Unilat W or Wo Pelvis 2-3 Views Right  Result Date: 05/21/2019 CLINICAL DATA:  Dislocation EXAM: DG HIP (WITH OR WITHOUT PELVIS) 2-3V RIGHT COMPARISON:  05/14/2019 FINDINGS: Dislocation of the right hip arthroplasty femoral head component relative to the acetabular cup. On the frontal radiograph, this is superiorly displaced and likely is posteriorly displaced, although that cannot be confirmed due to lack of true lateral view. No fracture is seen. Left hip joint space is preserved. Visualized bony pelvis appears intact. IMPRESSION: Recurrent prosthetic right hip dislocation, as described above. Electronically Signed   By: Todd Bills M.D.   On: 05/21/2019 07:53   DG Hip Unilat W or Wo Pelvis 2-3 Views Right  Result Date: 05/14/2019 CLINICAL DATA:  Suspected dislocation EXAM: DG HIP (WITH OR WITHOUT PELVIS) 2-3V RIGHT COMPARISON:  Radiograph 05/01/2019 FINDINGS: Recurrent posterolateral dislocation of the patient's total right hip arthroplasty. Femoral component demonstrates a collared femoral stem secured distally with cerclage wires. There is right hip swelling. No other acute osseous abnormality is seen. No evidence of periprosthetic fracture or progressive hardware loosening. IMPRESSION: Recurrent posterolateral dislocation of the patient's total right hip arthroplasty. Electronically Signed   By: Kreg Shropshire M.D.   On: 05/14/2019 18:13   DG Hip Unilat  With Pelvis 2-3 Views Right  Result Date: 05/01/2019 CLINICAL DATA:  49 year old male with right hip  dislocation. EXAM: DG HIP (WITH OR WITHOUT PELVIS) 2-3V RIGHT COMPARISON:  Intraoperative fluoroscopic image dated 04/29/2019. FINDINGS: There is a total right hip arthroplasty. There is superior dislocation of the femoral component with the femoral head located superior to the acetabular cup. There is no acute fracture. The soft tissues are unremarkable. IMPRESSION: Superiorly dislocated right hip arthroplasty. Electronically Signed   By: Elgie Collard M.D.   On: 05/01/2019 15:35   DG Hip Unilat  With Pelvis 2-3 Views Right  Result Date: 04/29/2019 CLINICAL DATA:  History of right hip dislocation. EXAM: DG HIP (WITH OR WITHOUT PELVIS) 2-3V RIGHT COMPARISON:  04/18/2019 FINDINGS: Recurrent dislocation of the prosthetic femoral head, similar to the previous instances. No evidence of fracture or other complicating feature. 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 Electronically Signed   By: Paulina Fusi M.D.   On: 04/29/2019 05:59    Disposition: Discharge disposition: 01-Home or Self Care       Discharge Instructions    Discharge patient   Complete by: As directed    Discharge disposition:  01-Home or Self Care   Discharge patient date: 05/22/2019         Signed: Armida Sans PA-C 05/22/2019, 9:13 PM

## 2019-05-22 NOTE — ED Provider Notes (Signed)
[facility]-EMERGENCY DEPT Provider Note   CSN: [id number] Arrival date & time: 05/22/19  1453     History No chief complaint on file.   Todd [name] is a 49 y.o. male presenting for evaluation of right hip dislocation and pain.  Patient states he has had frequent hip dislocations since 2015.  His most recent revision surgery with orthopedics was 2018.  He is scheduled for surgery to have surgery on March 23 with [facility] orthopedics for repeat revision.  Patient states he went to bed at 3 AM last night and was feeling good.  When he woke up at 11 AM this morning his hip was dislocated.  He has had pain since then.  He denies numbness or tingling.  He denies fall, trauma, or injury.  Patient states he was seen yesterday for the same, hip was reduced in the ED.  Patient states hip is described as a throbbing constant pain.  It does not radiate.  No pain in his back or knee.  He states he tries to wear his knee immobilizer as long as he can, but has issues with it rubbing against his skin.  He was not wearing it last night.  Additional history obtained from chart review.  Patient with a history of recurrent right hip dislocations.   HPI     Past Medical History:  Diagnosis Date  . Drug addiction (HCC)   . 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 GERD (gastroesophageal reflux disease)   . H/O hiatal hernia   . Hypertension     Patient Active Problem List   Diagnosis Date Noted  . 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 Closed dislocation of right hip (HCC) 04/29/2019  . Hypoxia 10/07/2015  . Mechanical complication of internal orthopedic device (HCC) 03/16/2015  . 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 Anemia 07/16/2014  . Altered mental status 10/20/2013  . Dislocation of hip prosthesis (HCC) 10/20/2013  . CAP (community acquired  pneumonia) 10/20/2013  . Acute GI bleeding 09/19/2013  . Acute blood loss anemia 09/19/2013  . Essential hypertension 09/19/2013  . Sinus tachycardia 09/19/2013  . GASTROESOPHAGEAL REFLUX, NO ESOPHAGITIS 05/06/2006    Past Surgical History:  Procedure Laterality Date  . APPENDECTOMY     as child  . COLONOSCOPY WITH PROPOFOL  02/17/2012   Procedure: COLONOSCOPY WITH PROPOFOL;  Surgeon: Willis Modena, MD;  Location: [facility] ENDOSCOPY;  Service: Endoscopy;  Laterality: N/A;  . ESOPHAGOGASTRODUODENOSCOPY Left 09/19/2013   Procedure: ESOPHAGOGASTRODUODENOSCOPY (EGD);  Surgeon: Willis Modena, MD;  Location: [facility] ENDOSCOPY;  Service: Endoscopy;  Laterality: Left;  . ESOPHAGOGASTRODUODENOSCOPY (EGD) WITH PROPOFOL  02/17/2012   Procedure: ESOPHAGOGASTRODUODENOSCOPY (EGD) WITH PROPOFOL;  Surgeon: Willis Modena, MD;  Location: [facility] ENDOSCOPY;  Service: Endoscopy;  Laterality: N/A;  . HIP CLOSED REDUCTION Right 10/20/2013   Procedure: CLOSED REDUCTION HIP;  Surgeon: Eldred Manges, MD;  Location: [facility] OR;  Service: Orthopedics;  Laterality: Right;  . HIP CLOSED REDUCTION Right 02/18/2015   Procedure: CLOSED REDUCTION HIP;  Surgeon: Jodi Geralds, MD;  Location: [facility] ORS;  Service: Orthopedics;  Laterality: Right;  . HIP CLOSED REDUCTION Right 03/16/2015   Procedure: CLOSED REDUCTION HIP;  Surgeon: Kathryne Hitch, MD;  Location: [facility] OR;  Service: Orthopedics;  Laterality: Right;  . HIP CLOSED REDUCTION Right 04/29/2019   Procedure: CLOSED MANIPULATION HIP;  Surgeon: Teryl Lucy, MD;  Location: [facility] ORS;  Service: Orthopedics;  Laterality: Right;  . HIP CLOSED REDUCTION Right 05/14/2019   Procedure: CLOSED REDUCTION HIP;  Surgeon: Deeann Saint, MD;  Location: [facility] ORS;  Service: Orthopedics;  Laterality: Right;  . JOINT REPLACEMENT  2005   Right Hip       Family History  Problem Relation Age of Onset  . Diabetes Mother     Social History   Tobacco Use  . Smoking status: Never Smoker  . Smokeless  tobacco: Never Used  Substance Use Topics  . Alcohol use: No  . Drug use: Not Currently    Types: IV    Home Medications Prior to Admission medications   Medication Sig Start Date End Date Taking? Authorizing Provider  amoxicillin (AMOXIL) 500 MG capsule Take 1 capsule (500 mg total) by mouth 3 (three) times daily. 05/21/19   Rolan Bucco, MD  busPIRone (BUSPAR) 10 MG tablet Take 10 mg by mouth 3 (three) times daily. 03/20/19   [provider]  dexmethylphenidate (FOCALIN) 10 MG tablet Take 10 mg by mouth 2 (two) times daily. 04/07/19   [provider]  FLUoxetine (PROZAC) 40 MG capsule Take 40 mg by mouth daily.    [provider]  loratadine (CLARITIN) 10 MG tablet Take 10 mg by mouth daily.     [provider]  metoprolol (LOPRESSOR) 50 MG tablet Take 50 mg by mouth 2 (two) times daily.     [provider]  Multiple Vitamins-Minerals (MENS MULTIVITAMIN) TABS Take 1 tablet by mouth daily.    [provider]  ondansetron (ZOFRAN) 4 MG tablet Take 4 mg by mouth every 8 (eight) hours as needed for nausea or vomiting.  11/26/18   [provider]  oxyCODONE (ROXICODONE) 15 MG immediate release tablet Take 15 mg by mouth every 6 (six) hours.  01/13/19   [provider]  oxymetazoline (AFRIN) 0.05 % nasal spray Place 1 spray into both nostrils 2 (two) times daily as needed for congestion.    [provider]  pantoprazole (PROTONIX) 40 MG tablet Take 40 mg by mouth 2 (two) times daily.  03/22/19   [provider]  promethazine (PHENERGAN) 25 MG tablet Take 25 mg by mouth at bedtime as needed for nausea/vomiting. 03/20/19   [provider]  testosterone cypionate (DEPOTESTOSTERONE CYPIONATE) 200 MG/ML injection Inject 200 mg into the muscle every 14 (fourteen) days.  11/11/18   [provider]  tiZANidine (ZANAFLEX) 4 MG tablet Take 4 mg by mouth every 6 (six) hours.     [provider]   triamcinolone ointment (KENALOG) 0.1 % Apply 1 application topically 2 (two) times daily as needed (psoriasis).    [provider]    Allergies    Ambien [zolpidem tartrate], Nu-iron [polysaccharide iron complex], and Fentanyl  Review of Systems   Review of Systems  Musculoskeletal: Positive for arthralgias and joint swelling.  All other systems reviewed and are negative.   Physical Exam Updated Vital Signs BP (!) 142/92 (BP Location: Left Arm)   Temp 98.1 F (36.7 C) (Oral)   Resp 16   Ht 6\' 1"  (1.854 m)   Wt 79.4 kg   SpO2 97%   BMI 23.09 kg/m   Physical Exam Vitals and nursing note reviewed.  Constitutional:      General: He is not in acute distress.    Appearance: He is well-developed.     Comments: Appears nontoxic  HENT:     Head: Normocephalic and  atraumatic.  Eyes:     Conjunctiva/sclera: Conjunctivae normal.     Pupils: Pupils are equal, round, and reactive to light.  Cardiovascular:     Rate and Rhythm: Normal rate and regular rhythm.  Pulmonary:     Effort: Pulmonary effort is normal. No respiratory distress.     Breath sounds: Normal breath sounds. No wheezing.  Abdominal:     General: Bowel sounds are normal. There is no distension.     Palpations: Abdomen is soft.     Tenderness: There is no abdominal tenderness.  Musculoskeletal:     Cervical back: Normal range of motion and neck supple.     Comments: Obvious deformity of the right hip.  Right leg is shortened and internally rotated.  Pedal pulses 2+ bilaterally.  Good distal cap refill and sensation.  Patient able to wiggle toes and ankle without difficulty.  Skin:    General: Skin is warm and dry.     Capillary Refill: Capillary refill takes less than 2 seconds.  Neurological:     Mental Status: He is alert and oriented to person, place, and time.     ED Results / Procedures / Treatments   Labs (all labs ordered are listed, but only abnormal results are displayed) Labs Reviewed -  No data to display  EKG None  Radiology DG HIP PORT UNILAT WITH PELVIS 1V RIGHT  Result Date: 05/21/2019 CLINICAL DATA:  Post right hip reduction EXAM: DG HIP (WITH OR WITHOUT PELVIS) 1V PORT RIGHT COMPARISON:  05/21/2019 at 0704 hours FINDINGS: Interval reduction of the right hip arthroplasty. No fracture is seen. 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 Left hip joint space is preserved. Visualized bony pelvis appears intact. IMPRESSION: Interval reduction of the right hip arthroplasty. 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 Electronically Signed   By: Julian Hy M.D.   On: 05/21/2019 08:45   DG Hip Unilat W or Wo Pelvis 2-3 Views Right  Result Date: 05/21/2019 CLINICAL DATA:  Dislocation EXAM: DG HIP (WITH OR WITHOUT PELVIS) 2-3V RIGHT COMPARISON:  05/14/2019 FINDINGS: Dislocation of the right hip arthroplasty femoral head component relative to the acetabular cup. On the frontal radiograph, this is superiorly displaced and likely is posteriorly displaced, although that cannot be confirmed due to lack of true lateral view. No fracture is seen. Left hip joint space is preserved. Visualized bony pelvis appears intact. IMPRESSION: Recurrent prosthetic right hip dislocation, as described above. Electronically Signed   By: Julian Hy M.D.   On: 05/21/2019 07:53    Procedures Reduction of dislocation  Date/Time: 05/22/2019 10:19 PM Performed by: Franchot Heidelberg, PA-C Authorized by: Franchot Heidelberg, PA-C  Consent: Verbal consent obtained. Written consent obtained. Risks and benefits: risks, benefits and alternatives were discussed Consent given by: patient Patient understanding: patient states understanding of the procedure being performed Patient consent: the patient's understanding of the procedure matches consent given Procedure consent: procedure consent matches procedure scheduled Relevant documents: relevant documents  present and verified Test results: test results available and properly labeled Site marked: the operative site was marked Imaging studies: imaging studies available Required items: required blood products, implants, devices, and special equipment available Patient identity confirmed: verbally with patient Time out: Immediately prior to procedure a "time out" was called to verify the correct patient, procedure, equipment, support staff and site/side marked as required. Preparation: Patient was prepped and draped in the usual sterile fashion. Local anesthesia used: no  Anesthesia: Local anesthesia used: no  Sedation: Patient sedated: attempted.  Comments: Attempted right hip reduction.  Unsuccessful due to inability to completely sedate patient.  Terminated at patient's request.    (including critical care time)  Medications Ordered in ED Medications - No data to display  ED Course  I have reviewed the triage vital signs and the nursing notes.  Pertinent labs & imaging results that were available during my care of the patient were reviewed by me and considered in my medical decision making (see chart for details).    MDM Rules/Calculators/A&P                      Patient presented for evaluation of right hip pain.  On exam, obvious deformity of the right hip, and right leg is shortened and internally rotated.  He is neurovascularly intact.  X-ray viewed interpreted by me, shows dislocation.  Attempted reduction, however unsuccessful due to inability to completely sedate patient.  Will consult with orthopedics.  Discussed with Dr. Dion Saucier from orthopedics who will take patient to the OR.  Final Clinical Impression(s) / ED Diagnoses Final diagnoses:  Anterior dislocation of right hip, initial encounter [facility])    Rx / DC Orders ED Discharge Orders    None       Alveria Apley, PA-C 05/22/19 2221    Bethann Berkshire, MD 05/22/19 2307

## 2019-05-22 NOTE — ED Notes (Signed)
IV start attempted x3.  Unsuccessful, another RN asked to start IV for pt.

## 2019-05-22 NOTE — ED Notes (Signed)
To OR

## 2019-05-22 NOTE — Sedation Documentation (Signed)
Another 20 mg propofol given IV per verbal from Estell Harpin, MD.

## 2019-05-22 NOTE — Sedation Documentation (Signed)
Verbal for 10 mg more of Ketamine to be given IV per Estell Harpin, MD.

## 2019-05-23 NOTE — Progress Notes (Signed)
Called patient at home via mobile number provided in chart to ensure he arrived home safely.  Patient answered and said he did get home safe and that he is doing well and appreciated the call.

## 2019-05-24 ENCOUNTER — Encounter: Payer: Self-pay | Admitting: *Deleted

## 2019-05-24 NOTE — Anesthesia Postprocedure Evaluation (Signed)
Anesthesia Post Note  Patient: Todd [name]  Procedure(s) Performed: CLOSED MANIPULATION HIP (Right Hip)     Patient location during evaluation: PACU Anesthesia Type: General Level of consciousness: awake and alert Pain management: pain level controlled Vital Signs Assessment: post-procedure vital signs reviewed and stable Respiratory status: spontaneous breathing, nonlabored ventilation, respiratory function stable and patient connected to nasal cannula oxygen Cardiovascular status: blood pressure returned to baseline and stable Postop Assessment: no apparent nausea or vomiting Anesthetic complications: no    Last Vitals:  Vitals:   05/22/19 2130 05/22/19 2145  BP: 111/90 (!) 157/95  Pulse: 95 93  Resp: 20 17  Temp:  36.8 C  SpO2: 98% 98%    Last Pain:  Vitals:   05/22/19 2145  TempSrc:   PainSc: 2    Pain Goal:                   Kyra Laffey L Tye Juarez

## 2019-05-26 ENCOUNTER — Emergency Department (HOSPITAL_COMMUNITY): Payer: Medicare Other

## 2019-05-26 ENCOUNTER — Encounter (HOSPITAL_COMMUNITY): Payer: Self-pay | Admitting: Emergency Medicine

## 2019-05-26 ENCOUNTER — Other Ambulatory Visit: Payer: Self-pay

## 2019-05-26 ENCOUNTER — Emergency Department (HOSPITAL_COMMUNITY)
Admission: EM | Admit: 2019-05-26 | Discharge: 2019-05-26 | Disposition: A | Discharge status: Home / Self Care | Payer: Medicare Other | Attending: Emergency Medicine | Admitting: Emergency Medicine

## 2019-05-26 DIAGNOSIS — M25551 Pain in right hip: Secondary | ICD-10-CM | POA: Diagnosis not present

## 2019-05-26 DIAGNOSIS — T84029A Dislocation of unspecified internal joint prosthesis, initial encounter: Secondary | ICD-10-CM

## 2019-05-26 DIAGNOSIS — Z96649 Presence of unspecified artificial hip joint: Secondary | ICD-10-CM

## 2019-05-26 DIAGNOSIS — T84020A Dislocation of internal right hip prosthesis, initial encounter: Secondary | ICD-10-CM | POA: Insufficient documentation

## 2019-05-26 MED ORDER — PROPOFOL 10 MG/ML IV BOLUS
INTRAVENOUS | Status: AC
  Filled 2019-05-26: qty 20

## 2019-05-26 MED ORDER — PROPOFOL 10 MG/ML IV BOLUS
0.5000 mg/kg | Freq: Once | INTRAVENOUS | Status: DC
  Filled 2019-05-26: qty 20

## 2019-05-26 MED ORDER — PROPOFOL 10 MG/ML IV BOLUS
INTRAVENOUS | Status: AC | PRN
  Administered 2019-05-26: 100 mg via INTRAVENOUS

## 2019-05-26 MED ORDER — HYDROMORPHONE HCL 1 MG/ML IJ SOLN
1.0000 mg | Freq: Once | INTRAMUSCULAR | Status: DC
  Filled 2019-05-26: qty 1

## 2019-05-26 NOTE — ED Triage Notes (Signed)
Pt reports his hip popped out again over night. States it has happened 3 times this week. Has a revision next Wednesday.

## 2019-05-26 NOTE — ED Provider Notes (Signed)
[facility] EMERGENCY DEPARTMENT Provider Note   CSN: [id number] Arrival date & time: 05/26/19  1343     History Chief Complaint  Patient presents with  . Hip Injury    Todd [name] is a 49 y.o. male.  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 C/o pain right hip. Symptoms acute onset, moderate, persistent, worse w movement, non radiating. No numbness or weakness. Has not tried to walk since. Feels same as prior dislocations. States is scheduled for surgery next week for same.   The history is provided by the patient.       Past Medical History:  Diagnosis Date  . Drug addiction ([place])   . 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 GERD (gastroesophageal reflux disease)   . H/O hiatal hernia   . Hypertension     Patient Active Problem List   Diagnosis Date Noted  . 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 Closed dislocation of right hip ([place]) 04/29/2019  . Hypoxia 10/07/2015  . Mechanical complication of internal orthopedic device ([place]) 03/16/2015  . 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 Anemia 07/16/2014  . Altered mental status 10/20/2013  . Dislocation of hip prosthesis ([place]) 10/20/2013  . CAP (community acquired pneumonia) 10/20/2013  . Acute GI bleeding 09/19/2013  . Acute blood loss anemia 09/19/2013  . Essential hypertension 09/19/2013  . Sinus tachycardia 09/19/2013  . GASTROESOPHAGEAL REFLUX, NO ESOPHAGITIS 05/06/2006    Past Surgical History:  Procedure Laterality Date  . APPENDECTOMY     as child  . COLONOSCOPY WITH PROPOFOL  02/17/2012   Procedure: COLONOSCOPY WITH PROPOFOL;  Surgeon: Arta Silence, MD;  Location: [facility];  Service: Endoscopy;  Laterality: N/A;  . ESOPHAGOGASTRODUODENOSCOPY  Left 09/19/2013   Procedure: ESOPHAGOGASTRODUODENOSCOPY (EGD);  Surgeon: Arta Silence, MD;  Location: [facility] ENDOSCOPY;  Service: Endoscopy;  Laterality: Left;  . ESOPHAGOGASTRODUODENOSCOPY (EGD) WITH PROPOFOL  02/17/2012   Procedure: ESOPHAGOGASTRODUODENOSCOPY (EGD) WITH PROPOFOL;  Surgeon: Arta Silence, MD;  Location: [facility] ENDOSCOPY;  Service: Endoscopy;  Laterality: N/A;  . HIP CLOSED REDUCTION Right 10/20/2013   Procedure: CLOSED REDUCTION HIP;  Surgeon: Marybelle Killings, MD;  Location: [place] [facility];  Service: Orthopedics;  Laterality: Right;  . HIP CLOSED REDUCTION Right 02/18/2015   Procedure: CLOSED REDUCTION HIP;  Surgeon: Dorna Leitz, MD;  Location: [facility];  Service: Orthopedics;  Laterality: Right;  . HIP CLOSED REDUCTION Right 03/16/2015   Procedure: CLOSED REDUCTION HIP;  Surgeon: Mcarthur Rossetti, MD;  Location: [place];  Service: Orthopedics;  Laterality: Right;  . HIP CLOSED REDUCTION Right 04/29/2019   Procedure: CLOSED MANIPULATION HIP;  Surgeon: Marchia Bond, MD;  Location: [facility];  Service: Orthopedics;  Laterality: Right;  . HIP CLOSED REDUCTION Right 05/14/2019   Procedure: CLOSED REDUCTION HIP;  Surgeon: Earnestine Leys, MD;  Location: [facility] ORS;  Service: Orthopedics;  Laterality: Right;  . HIP CLOSED REDUCTION Right 05/22/2019   Procedure: CLOSED MANIPULATION HIP;  Surgeon: Marchia Bond, MD;  Location: [facility];  Service: Orthopedics;  Laterality: Right;  . JOINT REPLACEMENT  2005   Right Hip       Family History  Problem Relation Age of Onset  . Diabetes Mother     Social History   Tobacco Use  . Smoking status: Never Smoker  . Smokeless  tobacco: Never Used  Substance Use Topics  . Alcohol use: No  . Drug use: Not Currently    Types: IV    Home Medications Prior to Admission medications   Medication Sig Start Date End Date Taking? Authorizing Provider  amoxicillin (AMOXIL) 500 MG capsule Take 1 capsule (500 mg total) by mouth 3 (three) times daily. 05/21/19   Rolan BuccoBelfi,  Melanie, MD  busPIRone (BUSPAR) 10 MG tablet Take 10 mg by mouth 3 (three) times daily. 03/20/19   [provider]  dexmethylphenidate (FOCALIN) 10 MG tablet Take 10 mg by mouth 2 (two) times daily. 04/07/19   [provider]  FLUoxetine (PROZAC) 40 MG capsule Take 40 mg by mouth daily.    [provider]  loratadine (CLARITIN) 10 MG tablet Take 10 mg by mouth daily.     [provider]  metoprolol (LOPRESSOR) 50 MG tablet Take 50 mg by mouth 2 (two) times daily.     [provider]  Multiple Vitamins-Minerals (MENS MULTIVITAMIN) TABS Take 1 tablet by mouth daily.    [provider]  ondansetron (ZOFRAN) 4 MG tablet Take 4 mg by mouth every 8 (eight) hours as needed for nausea or vomiting.  11/26/18   [provider]  oxyCODONE (ROXICODONE) 15 MG immediate release tablet Take 15 mg by mouth every 6 (six) hours.  01/13/19   [provider]  oxymetazoline (AFRIN) 0.05 % nasal spray Place 1 spray into both nostrils 2 (two) times daily as needed for congestion.    [provider]  pantoprazole (PROTONIX) 40 MG tablet Take 40 mg by mouth 2 (two) times daily.  03/22/19   [provider]  promethazine (PHENERGAN) 25 MG tablet Take 25 mg by mouth at bedtime as needed for nausea/vomiting. 03/20/19   [provider]  testosterone cypionate (DEPOTESTOSTERONE CYPIONATE) 200 MG/ML injection Inject 200 mg into the muscle every 14 (fourteen) days.  11/11/18   [provider]  tiZANidine (ZANAFLEX) 4 MG tablet Take 4 mg by mouth every 6 (six) hours.     [provider]  triamcinolone ointment (KENALOG) 0.1 % Apply 1 application topically 2 (two) times daily as needed (psoriasis).    [provider]    Allergies    Ambien [zolpidem tartrate], Nu-iron [polysaccharide iron complex], and Fentanyl  Review of Systems   Review of Systems  Constitutional: Negative for fever.  HENT: Negative for sore  throat.   Eyes: Negative for redness.  Respiratory: Negative for shortness of breath.   Cardiovascular: Negative for chest pain.  Gastrointestinal: Negative for abdominal pain and vomiting.  Genitourinary: Negative for flank pain.  Musculoskeletal: Negative for back pain and neck pain.  Skin: Negative for rash.  Neurological: Negative for weakness and numbness.  Hematological: Does not bruise/bleed easily.  Psychiatric/Behavioral: Negative for confusion.    Physical Exam Updated Vital Signs BP (!) 128/93   Pulse 97   Temp 98.1 F (36.7 C) (Oral)   Resp 16   Ht 1.854 m (6\' 1" )   Wt 79.4 kg   SpO2 97%   BMI 23.09 kg/m   Physical Exam Vitals and nursing note reviewed.  Constitutional:      Appearance: Normal appearance. He is well-developed.  HENT:     Head: Atraumatic.     Nose: Nose normal.     Mouth/Throat:     Mouth: Mucous membranes are moist.     Pharynx: Oropharynx is clear.  Eyes:     General: No scleral  icterus.    Conjunctiva/sclera: Conjunctivae normal.  Neck:     Trachea: No tracheal deviation.  Cardiovascular:     Rate and Rhythm: Normal rate and regular rhythm.     Pulses: Normal pulses.     Heart sounds: Normal heart sounds. No murmur. No friction rub. No gallop.   Pulmonary:     Effort: Pulmonary effort is normal. No accessory muscle usage or respiratory distress.     Breath sounds: Normal breath sounds.  Abdominal:     General: Bowel sounds are normal. There is no distension.     Palpations: Abdomen is soft.     Tenderness: There is no abdominal tenderness. There is no guarding.  Genitourinary:    Comments: No cva tenderness. Musculoskeletal:     Cervical back: Normal range of motion and neck supple. No rigidity.     Comments: RLE sl shortened. Tender/painful right hip. Distal pulses palp.   Skin:    General: Skin is warm and dry.     Findings: No rash.  Neurological:     Mental Status: He is alert.     Comments: Alert, speech clear. RLE  motor/sens grossly intact.   Psychiatric:        Mood and Affect: Mood normal.     ED Results / Procedures / Treatments   Labs (all labs ordered are listed, but only abnormal results are displayed) Labs Reviewed - No data to display  EKG None  Radiology DG Hip [place] W or [place] Pelvis 1 View Right  Result Date: 05/26/2019 CLINICAL DATA:  Post hip reduction EXAM: DG HIP (WITH OR WITHOUT PELVIS) 1V PORT RIGHT COMPARISON:  May 26, 2019 FINDINGS: The patient has undergone reduction of the previously demonstrated right hip dislocation. The alignment is improved. There is no evidence for an acute displaced fracture. No dislocation. 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 IMPRESSION: Interval reduction of the previously demonstrated right hip dislocation. No evidence for an acute displaced fracture. Electronically Signed   By: Katherine Mantle M.D.   On: 05/26/2019 17:11   DG Hip Unilat  With Pelvis 2-3 Views Right  Result Date: 05/26/2019 CLINICAL DATA:  Hip dislocation. RIGHT hip keeps getting dislocated. This is happened 2 times in the last 3 days in numerous times before. EXAM: DG HIP (WITH OR WITHOUT PELVIS) 2-3V RIGHT COMPARISON:  05/22/2019 FINDINGS: RIGHT total hip arthroplasty. The femoral head component is superiorly dislocated relative to the acetabular cup. No acute fracture. Hardware intact. IMPRESSION: Dislocated RIGHT hip arthroplasty. Electronically Signed   By: Norva Pavlov M.D.   On: 05/26/2019 14:27    Procedures .Sedation  Date/Time: 05/26/2019 5:28 PM Performed by: Cathren Laine, MD Authorized by: Cathren Laine, MD   Consent:    Consent obtained:  Verbal   Consent given by:  Patient   Risks discussed:  Allergic reaction, dysrhythmia, inadequate sedation, nausea, prolonged hypoxia resulting in organ damage, prolonged sedation necessitating reversal, respiratory compromise necessitating  ventilatory assistance and intubation and vomiting   Alternatives discussed:  Analgesia without sedation, anxiolysis and regional anesthesia Universal protocol:    Procedure explained and questions answered to patient or proxy's satisfaction: yes     Relevant documents present and verified: yes     Test results available and properly labeled: yes     Imaging studies available: yes     Required blood products, implants, devices, and special equipment available: yes     Site/side marked:  yes     Immediately prior to procedure a time out was called: yes     Patient identity confirmation method:  Verbally with patient, provided demographic data and arm band Indications:    Procedure necessitating sedation performed by:  Physician performing sedation Pre-sedation assessment:    Time since last food or drink:  Last night   ASA classification: class 1 - normal, healthy patient     Neck mobility: normal     Mouth opening:  3 or more finger widths   Thyromental distance:  4 finger widths   Mallampati score:  I - soft palate, uvula, fauces, pillars visible   Pre-sedation assessments completed and reviewed: airway patency, cardiovascular function, hydration status, mental status, nausea/vomiting, pain level, respiratory function and temperature   Immediate pre-procedure details:    Reassessment: Patient reassessed immediately prior to procedure     Reviewed: vital signs, relevant labs/tests and NPO status     Verified: bag valve mask available, emergency equipment available, intubation equipment available, IV patency confirmed, oxygen available and suction available   Procedure details (see MAR for exact dosages):    Preoxygenation:  Nasal cannula   Sedation:  Propofol   Intended level of sedation: deep   Intra-procedure monitoring:  Blood pressure monitoring, cardiac monitor, continuous pulse oximetry, frequent LOC assessments, frequent vital sign checks and continuous capnometry    Intra-procedure events: none     Total Provider sedation time (minutes):  25 Post-procedure details:    Post-sedation assessment completed:  05/26/2019 5:29 PM   Attendance: Constant attendance by certified staff until patient recovered     Recovery: Patient returned to pre-procedure baseline     Post-sedation assessments completed and reviewed: airway patency, cardiovascular function, hydration status, mental status, nausea/vomiting, pain level, respiratory function and temperature     Patient is stable for discharge or admission: yes     Patient tolerance:  Tolerated well, no immediate complications .Ortho Injury Treatment  Date/Time: 05/26/2019 5:33 PM Performed by: Cathren Laine, MD Authorized by: Cathren Laine, MD   Consent:    Consent obtained:  Verbal and written   Consent given by:  PatientInjury location: hip Location details: right hip Injury type: dislocation Dislocation type: posterior Spontaneous dislocation: yes Prosthesis: yes Pre-procedure neurovascular assessment: neurovascularly intact Pre-procedure distal perfusion: normal Pre-procedure neurological function: normal Pre-procedure range of motion: reduced Manipulation performed: yes Reduction method: on stretcher, traction upward/inf, with counter pressure applied to pelvis. Reduction successful: yes X-ray confirmed reduction: yes Immobilization: brace (knee imm + abduction pillow) Post-procedure neurovascular assessment: post-procedure neurovascularly intact Post-procedure distal perfusion: normal Post-procedure neurological function: normal Post-procedure range of motion: normal Patient tolerance: patient tolerated the procedure well with no immediate complications    (including critical care time)  Medications Ordered in ED Medications  HYDROmorphone (DILAUDID) injection 1 mg (has no administration in time range)  propofol (DIPRIVAN) 10 mg/mL bolus/IV push 39.7 mg (has no administration in time range)     ED Course  I have reviewed the triage vital signs and the nursing notes.  Pertinent labs & imaging results that were available during my care of the patient were reviewed by me and considered in my medical decision making (see chart for details).    MDM Rules/Calculators/A&P                      Stat imaging. Iv ns. Dilaudid 1 mg iv.   Reviewed nursing notes and prior charts for additional history. Several recent dislocations noted -  past time reduced in OR, but previously reduced in ED.   Procedural sedation/propofol.  Xrays reviewed/interpreted by me - dislocated right hip prosthesis.   Procedural sedation with propofol. After initial 100 mg iv pt was awake, not adequately relaxed. An additional 100 mg was given w good sedation and relaxation - hip reduced. Pulse ox remained > 95% at all times.   Distal pulse 2+. Normal rom. Knee immobilizer and abduction pillow applied. Pain relieved.   Recheck, fully awake and alert. No resp depression. No numbness/weakness. Pain controlled.   Repeat xrays reviewed/interpreted by me - dislocation is reduced.   No driving until cleared by ortho. F/u as planned for revision surgery. Until then, knee immobilizer at all times w abduction pillow.   Pt requests d/c.   Return precautions provided.      Final Clinical Impression(s) / ED Diagnoses Final diagnoses:  None    Rx / DC Orders ED Discharge Orders    None       Cathren Laine, MD 05/26/19 1735

## 2019-05-26 NOTE — ED Notes (Signed)
Patient verbalizes understanding of discharge instructions. Opportunity for questioning and answers were provided. Armband removed by staff. Patient left AMA from ED. Signature pad unavailable.

## 2019-05-26 NOTE — Discharge Instructions (Signed)
It was our pleasure to provide your ER care today - we hope that you feel better.  Wear knee immobilizer and use abduction pillow at all times, even while sleeping.  Take your pain medication as need.   Follow up with your orthopedist as planned in the coming week.  Return to ER if worse, new symptoms, severe pain, numbness/weakness, recurrent dislocation, or other concern.  No driving until cleared to do so by your orthopedist.  Also, you were given sedating medication in the ER - no operating heavy machinery, or other potentially dangerous activity for the next 12 hours.

## 2019-05-26 NOTE — ED Notes (Addendum)
Pt left AMA prior to receiving discharge papers or knee immobilizer and hip abduction pillow. Potential adverse from leaving AMA w/o ortho support explained to patient. Provider made aware

## 2019-05-28 ENCOUNTER — Other Ambulatory Visit: Payer: Self-pay

## 2019-05-28 ENCOUNTER — Encounter (HOSPITAL_COMMUNITY): Payer: Self-pay | Admitting: Emergency Medicine

## 2019-05-28 ENCOUNTER — Emergency Department (HOSPITAL_COMMUNITY): Payer: Medicare Other

## 2019-05-28 ENCOUNTER — Emergency Department (HOSPITAL_COMMUNITY)
Admission: EM | Admit: 2019-05-28 | Discharge: 2019-05-28 | Discharge status: Against Medical Advice | Payer: Medicare Other | Attending: Emergency Medicine | Admitting: Emergency Medicine

## 2019-05-28 DIAGNOSIS — M25551 Pain in right hip: Secondary | ICD-10-CM | POA: Insufficient documentation

## 2019-05-28 DIAGNOSIS — I10 Essential (primary) hypertension: Secondary | ICD-10-CM | POA: Insufficient documentation

## 2019-05-28 DIAGNOSIS — Y69 Unspecified misadventure during surgical and medical care: Secondary | ICD-10-CM | POA: Diagnosis not present

## 2019-05-28 DIAGNOSIS — Z79899 Other long term (current) drug therapy: Secondary | ICD-10-CM | POA: Insufficient documentation

## 2019-05-28 DIAGNOSIS — T84020A Dislocation of internal right hip prosthesis, initial encounter: Secondary | ICD-10-CM | POA: Diagnosis present

## 2019-05-28 DIAGNOSIS — S73014A Posterior dislocation of right hip, initial encounter: Secondary | ICD-10-CM

## 2019-05-28 MED ORDER — PROPOFOL 10 MG/ML IV BOLUS
INTRAVENOUS | Status: AC | PRN
  Administered 2019-05-28: 160 mg via INTRAVENOUS

## 2019-05-28 MED ORDER — PROPOFOL 10 MG/ML IV BOLUS
0.5000 mg/kg | Freq: Once | INTRAVENOUS | Status: AC
  Administered 2019-05-28: 160 mg via INTRAVENOUS
  Filled 2019-05-28: qty 20

## 2019-05-28 NOTE — ED Notes (Signed)
Pt currently A+Ox4, ambulatory, VSS. Back at preprocedure baseline. Pt did not want to wait for post procedure xray and wanted to leave AMA. Advised Dr. Rodena Medin. Pt verbalized understanding of leaving AMA and signed AMA form.

## 2019-05-28 NOTE — ED Triage Notes (Addendum)
Patient reports that his R hip popped out of joint while he was sleeping, reports this is a chronic problem for him. Multiple hip surgeries with another revision coming up on 3/23. Seen here Friday for the same.

## 2019-05-28 NOTE — ED Provider Notes (Addendum)
[name] EMERGENCY DEPARTMENT Provider Note   CSN: [id number] Arrival date & time: 05/28/19  1341     History Chief Complaint  Patient presents with  . Hip Pain    dislocation    Todd [name] is a 49 y.o. male.  49 year old male with prior medical history as detailed below presents for evaluation of right hip dislocation.  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  He reports multiple prior dislocations of same.  His last visit to this facility was 2 days prior for similar complaint.  His hip was successfully reduced in the ED.  Today's evaluation will be the eighth hip dislocation that he has had in the last 2 months.  Patient reports that he is scheduled for revision of his right hip later this week with his primary orthopedic doctor.  He woke with symptoms.  He reports that he thinks that his hip is gated in the night while he was asleep.  He reports that he has not had anything to eat or drink today.  The history is provided by the patient and medical records.  Hip Pain This is a recurrent problem. The problem occurs every several days. The problem has not changed since onset.Pertinent negatives include no chest pain. Nothing aggravates the symptoms. Nothing relieves the symptoms.       Past Medical History:  Diagnosis Date  . Drug addiction (HCC)   . 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 GERD (gastroesophageal reflux disease)   . H/O hiatal hernia   . Hypertension     Patient Active Problem List   Diagnosis Date Noted  . 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 Closed dislocation of right hip (HCC) 04/29/2019  . Hypoxia 10/07/2015  . Mechanical complication of internal orthopedic device (HCC) 03/16/2015  . 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 Anemia 07/16/2014  . Altered mental status 10/20/2013  . Dislocation  of hip prosthesis (HCC) 10/20/2013  . CAP (community acquired pneumonia) 10/20/2013  . Acute GI bleeding 09/19/2013  . Acute blood loss anemia 09/19/2013  . Essential hypertension 09/19/2013  . Sinus tachycardia 09/19/2013  . GASTROESOPHAGEAL REFLUX, NO ESOPHAGITIS 05/06/2006    Past Surgical History:  Procedure Laterality Date  . APPENDECTOMY     as child  . COLONOSCOPY WITH PROPOFOL  02/17/2012   Procedure: COLONOSCOPY WITH PROPOFOL;  Surgeon: Willis Modena, MD;  Location: [facility] ENDOSCOPY;  Service: Endoscopy;  Laterality: N/A;  . ESOPHAGOGASTRODUODENOSCOPY Left 09/19/2013   Procedure: ESOPHAGOGASTRODUODENOSCOPY (EGD);  Surgeon: Willis Modena, MD;  Location: [facility] ENDOSCOPY;  Service: Endoscopy;  Laterality: Left;  . ESOPHAGOGASTRODUODENOSCOPY (EGD) WITH PROPOFOL  02/17/2012   Procedure: ESOPHAGOGASTRODUODENOSCOPY (EGD) WITH PROPOFOL;  Surgeon: Willis Modena, MD;  Location: [facility] ENDOSCOPY;  Service: Endoscopy;  Laterality: N/A;  . HIP CLOSED REDUCTION Right 10/20/2013   Procedure: CLOSED REDUCTION HIP;  Surgeon: Eldred Manges, MD;  Location: [facility] OR;  Service: Orthopedics;  Laterality: Right;  . HIP CLOSED REDUCTION Right 02/18/2015   Procedure: CLOSED REDUCTION HIP;  Surgeon: Jodi Geralds, MD;  Location: [facility] ORS;  Service: Orthopedics;  Laterality: Right;  . HIP CLOSED REDUCTION Right 03/16/2015   Procedure: CLOSED REDUCTION HIP;  Surgeon: Kathryne Hitch, MD;  Location: [facility] OR;  Service: Orthopedics;  Laterality: Right;  . HIP CLOSED REDUCTION Right 04/29/2019   Procedure: CLOSED MANIPULATION HIP;  Surgeon: Teryl Lucy, MD;  Location: [facility] ORS;  Service: Orthopedics;  Laterality: Right;  . HIP CLOSED REDUCTION Right 05/14/2019   Procedure: CLOSED REDUCTION HIP;  Surgeon: Deeann Saint, MD;  Location: [facility] ORS;  Service: Orthopedics;  Laterality: Right;  . HIP CLOSED REDUCTION Right 05/22/2019   Procedure: CLOSED MANIPULATION HIP;  Surgeon: Teryl Lucy, MD;  Location: [facility] ORS;  Service:  Orthopedics;  Laterality: Right;  . JOINT REPLACEMENT  2005   Right Hip       Family History  Problem Relation Age of Onset  . Diabetes Mother     Social History   Tobacco Use  . Smoking status: Never Smoker  . Smokeless tobacco: Never Used  Substance Use Topics  . Alcohol use: No  . Drug use: Not Currently    Types: IV    Home Medications Prior to Admission medications   Medication Sig Start Date End Date Taking? Authorizing Provider  amoxicillin (AMOXIL) 500 MG capsule Take 1 capsule (500 mg total) by mouth 3 (three) times daily. 05/21/19   Rolan Bucco, MD  busPIRone (BUSPAR) 10 MG tablet Take 10 mg by mouth 3 (three) times daily. 03/20/19   [provider]  dexmethylphenidate (FOCALIN) 10 MG tablet Take 10 mg by mouth 2 (two) times daily. 04/07/19   [provider]  FLUoxetine (PROZAC) 40 MG capsule Take 40 mg by mouth daily.    [provider]  loratadine (CLARITIN) 10 MG tablet Take 10 mg by mouth daily.     [provider]  metoprolol (LOPRESSOR) 50 MG tablet Take 50 mg by mouth 2 (two) times daily.     [provider]  Multiple Vitamins-Minerals (MENS MULTIVITAMIN) TABS Take 1 tablet by mouth daily.    [provider]  ondansetron (ZOFRAN) 4 MG tablet Take 4 mg by mouth every 8 (eight) hours as needed for nausea or vomiting.  11/26/18   [provider]  oxyCODONE (ROXICODONE) 15 MG immediate release tablet Take 15 mg by mouth every 6 (six) hours.  01/13/19   [provider]  oxymetazoline (AFRIN) 0.05 % nasal spray Place 1 spray into both nostrils 2 (two) times daily as needed for congestion.    [provider]  pantoprazole (PROTONIX) 40 MG tablet Take 40 mg by mouth 2 (two) times daily.  03/22/19   [provider]  promethazine (PHENERGAN) 25 MG tablet Take 25 mg by mouth at bedtime as needed for nausea/vomiting. 03/20/19   [provider]  testosterone cypionate  (DEPOTESTOSTERONE CYPIONATE) 200 MG/ML injection Inject 200 mg into the muscle every 14 (fourteen) days.  11/11/18   [provider]  tiZANidine (ZANAFLEX) 4 MG tablet Take 4 mg by mouth every 6 (six) hours.     [provider]  triamcinolone ointment (KENALOG) 0.1 % Apply 1 application topically 2 (two) times daily as needed (psoriasis).    [provider]    Allergies    Ambien [zolpidem tartrate], Nu-iron [polysaccharide iron complex], and Fentanyl  Review of Systems   Review of Systems  Cardiovascular: Negative for chest pain.  All other systems reviewed and are negative.   Physical Exam Updated Vital Signs BP 136/82 (BP Location: Left Arm)   Pulse 84   Temp 98 F (36.7 C) (Oral)   Resp 16   SpO2 95%   Physical Exam Vitals and nursing note reviewed.  Constitutional:      General: He is not in acute distress.    Appearance: Normal appearance. He is well-developed.  HENT:  Head: Normocephalic and atraumatic.  Eyes:     Conjunctiva/sclera: Conjunctivae normal.     Pupils: Pupils are equal, round, and reactive to light.  Cardiovascular:     Rate and Rhythm: Normal rate and regular rhythm.     Heart sounds: Normal heart sounds.  Pulmonary:     Effort: Pulmonary effort is normal. No respiratory distress.     Breath sounds: Normal breath sounds.  Abdominal:     General: There is no distension.     Palpations: Abdomen is soft.     Tenderness: There is no abdominal tenderness.  Musculoskeletal:        General: No deformity.     Cervical back: Normal range of motion and neck supple.     Comments: Right lower extremity is shortened and externally rotated.  There is tenderness to the right lateral aspect with palpable deformity that is likely dislocated femoral head.  Skin:    General: Skin is warm and dry.  Neurological:     General: No focal deficit present.     Mental Status: He is alert and oriented to person, place, and time. Mental status  is at baseline.     ED Results / Procedures / Treatments   Labs (all labs ordered are listed, but only abnormal results are displayed) Labs Reviewed - No data to display  EKG None  Radiology DG Hip [place] W or [place] Pelvis 1 View Right  Result Date: 05/26/2019 CLINICAL DATA:  Post hip reduction EXAM: DG HIP (WITH OR WITHOUT PELVIS) 1V PORT RIGHT COMPARISON:  May 26, 2019 FINDINGS: The patient has undergone reduction of the previously demonstrated right hip dislocation. The alignment is improved. There is no evidence for an acute displaced fracture. No dislocation. 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 IMPRESSION: Interval reduction of the previously demonstrated right hip dislocation. No evidence for an acute displaced fracture. Electronically Signed   By: Constance Holster M.D.   On: 05/26/2019 17:11    Procedures .Sedation  Date/Time: 05/28/2019 4:52 PM Performed by: Valarie Merino, MD Authorized by: Valarie Merino, MD   Consent:    Consent obtained:  Verbal   Consent given by:  Patient   Risks discussed:  Allergic reaction, dysrhythmia, inadequate sedation, nausea, prolonged hypoxia resulting in organ damage, prolonged sedation necessitating reversal, respiratory compromise necessitating ventilatory assistance and intubation and vomiting   Alternatives discussed:  Analgesia without sedation, anxiolysis and regional anesthesia Universal protocol:    Procedure explained and questions answered to patient or proxy's satisfaction: yes     Relevant documents present and verified: yes     Test results available and properly labeled: yes     Imaging studies available: yes     Required blood products, implants, devices, and special equipment available: yes     Site/side marked: yes     Immediately prior to procedure a time out was called: yes     Patient identity confirmation method:  Verbally with  patient Indications:    Procedure necessitating sedation performed by:  Physician performing sedation Pre-sedation assessment:    Time since last food or drink:  12 hours    ASA classification: class 1 - normal, healthy patient     Neck mobility: normal     Mouth opening:  3 or more finger widths   Thyromental distance:  4 finger widths   Mallampati score:  I - soft palate, uvula, fauces, pillars visible  Pre-sedation assessments completed and reviewed: airway patency, cardiovascular function, hydration status, mental status, nausea/vomiting, pain level, respiratory function and temperature   Immediate pre-procedure details:    Reassessment: Patient reassessed immediately prior to procedure     Reviewed: vital signs, relevant labs/tests and NPO status     Verified: bag valve mask available, emergency equipment available, intubation equipment available, IV patency confirmed, oxygen available and suction available   Procedure details (see MAR for exact dosages):    Preoxygenation:  Nasal cannula   Sedation:  Propofol   Intended level of sedation: deep   Intra-procedure monitoring:  Blood pressure monitoring, cardiac monitor, continuous pulse oximetry, frequent LOC assessments, frequent vital sign checks and continuous capnometry   Intra-procedure events: none     Total Provider sedation time (minutes):  40 Post-procedure details:    Attendance: Constant attendance by certified staff until patient recovered     Recovery: Patient returned to pre-procedure baseline     Post-sedation assessments completed and reviewed: airway patency, cardiovascular function, hydration status, mental status, nausea/vomiting, pain level, respiratory function and temperature     Patient is stable for discharge or admission: yes     Patient tolerance:  Tolerated well, no immediate complications .Ortho Injury Treatment  Date/Time: 05/28/2019 4:53 PM Performed by: Wynetta FinesMessick, Marinda Tyer C, MD Authorized by: Wynetta FinesMessick,  Aubre Quincy C, MD   Consent:    Consent obtained:  Verbal   Consent given by:  Patient   Risks discussed:  Fracture, irreducible dislocation, recurrent dislocation, nerve damage, restricted joint movement, stiffness and vascular damage   Alternatives discussed:  No treatmentInjury location: hip Location details: right hip Injury type: dislocation Spontaneous dislocation: yes Prosthesis: yes Pre-procedure distal perfusion: normal Pre-procedure neurological function: normal Pre-procedure range of motion: normal  Anesthesia: Local anesthesia used: no  Patient sedated: Yes. Refer to sedation procedure documentation for details of sedation. Manipulation performed: yes Reduction method: external rotation Reduction successful: yes X-ray confirmed reduction: yes Post-procedure neurovascular assessment: post-procedure neurovascularly intact Post-procedure distal perfusion: normal Post-procedure neurological function: normal Post-procedure range of motion: normal Patient tolerance: patient tolerated the procedure well with no immediate complications    (including critical care time)  Medications Ordered in ED Medications  propofol (DIPRIVAN) 10 mg/mL bolus/IV push 39.7 mg (has no administration in time range)    ED Course  I have reviewed the triage vital signs and the nursing notes.  Pertinent labs & imaging results that were available during my care of the patient were reviewed by me and considered in my medical decision making (see chart for details).    MDM Rules/Calculators/A&P                      MDM  Screen complete  Todd [name] was evaluated in Emergency Department on 05/28/2019 for the symptoms described in the history of present illness. He was evaluated in the context of the global COVID-19 pandemic, which necessitated consideration that the patient might be at risk for infection with the SARS-CoV-2 virus that causes COVID-19. Institutional protocols and algorithms that  pertain to the evaluation of patients at risk for COVID-19 are in a state of rapid change based on information released by regulatory bodies including the CDC and federal and state organizations. These policies and algorithms were followed during the patient's care in the ED.  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  Patient reports that the dislocation occurred while he was asleep.  This would be his eighth visit  to our facilities in the last 2 months for the same complaint.  He tells me that he is scheduled for revision of his right hip on Tuesday of this week.  Patient was sedated with propofol without difficulty.  Right hip was reduced without difficulty.  Patient understands need for close follow-up.  He reports that he has close follow-up with his orthopedic doctor and has a scheduled revision of the right hip on Tuesday of this week.   1700 Post sedation and reduction the patient is now ambulating around the ED.  He declines to be re-imaged for confirmation of hip reduction.  He is able to ambulate without difficulty.  He states that he is ready to leave.  Patient ambulated from the ED without difficulty.    Final Clinical Impression(s) / ED Diagnoses Final diagnoses:  Posterior dislocation of right hip, initial encounter [facility])    Rx / DC Orders ED Discharge Orders    None       Wynetta Fines, MD 05/28/19 1655    Wynetta Fines, MD 05/28/19 1709    Wynetta Fines, MD 05/28/19 [phone]

## 2019-05-29 ENCOUNTER — Other Ambulatory Visit: Payer: Self-pay

## 2019-05-29 ENCOUNTER — Emergency Department (HOSPITAL_COMMUNITY): Payer: Medicare Other

## 2019-05-29 ENCOUNTER — Encounter (HOSPITAL_COMMUNITY): Payer: Self-pay | Admitting: Emergency Medicine

## 2019-05-29 ENCOUNTER — Emergency Department (HOSPITAL_COMMUNITY)
Admission: EM | Admit: 2019-05-29 | Discharge: 2019-05-29 | Disposition: A | Discharge status: Home / Self Care | Payer: Medicare Other | Attending: Emergency Medicine | Admitting: Emergency Medicine

## 2019-05-29 DIAGNOSIS — M25551 Pain in right hip: Secondary | ICD-10-CM | POA: Insufficient documentation

## 2019-05-29 DIAGNOSIS — Z5321 Procedure and treatment not carried out due to patient leaving prior to being seen by health care provider: Secondary | ICD-10-CM | POA: Insufficient documentation

## 2019-05-29 NOTE — ED Triage Notes (Signed)
Pt reports that his right hip popped out of joint while he was sleeping, hx of same issue. Multiple hip surgeries with another revision tomorrow. Here yesterday for same problem, reports conscious sedation completed multiple times. Unable to ambulate in triage.

## 2020-05-06 ENCOUNTER — Emergency Department (HOSPITAL_COMMUNITY)
Admission: EM | Admit: 2020-05-06 | Discharge: 2020-05-07 | Disposition: A | Discharge status: Home / Self Care | Payer: Medicare (Managed Care) | Attending: Emergency Medicine | Admitting: Emergency Medicine

## 2020-05-06 ENCOUNTER — Encounter (HOSPITAL_COMMUNITY): Payer: Self-pay | Admitting: *Deleted

## 2020-05-06 DIAGNOSIS — F430 Acute stress reaction: Secondary | ICD-10-CM | POA: Diagnosis not present

## 2020-05-06 DIAGNOSIS — R4588 Nonsuicidal self-harm: Secondary | ICD-10-CM | POA: Diagnosis not present

## 2020-05-06 DIAGNOSIS — Z96641 Presence of right artificial hip joint: Secondary | ICD-10-CM | POA: Diagnosis not present

## 2020-05-06 DIAGNOSIS — T1491XA Suicide attempt, initial encounter: Secondary | ICD-10-CM | POA: Insufficient documentation

## 2020-05-06 DIAGNOSIS — Z20822 Contact with and (suspected) exposure to covid-19: Secondary | ICD-10-CM | POA: Insufficient documentation

## 2020-05-06 DIAGNOSIS — R000 Tachycardia, unspecified: Secondary | ICD-10-CM | POA: Diagnosis not present

## 2020-05-06 DIAGNOSIS — F062 Psychotic disorder with delusions due to known physiological condition: Secondary | ICD-10-CM | POA: Diagnosis not present

## 2020-05-06 DIAGNOSIS — F1910 Other psychoactive substance abuse, uncomplicated: Secondary | ICD-10-CM | POA: Insufficient documentation

## 2020-05-06 DIAGNOSIS — F1124 Opioid dependence with opioid-induced mood disorder: Secondary | ICD-10-CM

## 2020-05-06 DIAGNOSIS — M25551 Pain in right hip: Secondary | ICD-10-CM | POA: Insufficient documentation

## 2020-05-06 DIAGNOSIS — G8929 Other chronic pain: Secondary | ICD-10-CM | POA: Insufficient documentation

## 2020-05-06 DIAGNOSIS — W260XXA Contact with knife, initial encounter: Secondary | ICD-10-CM | POA: Diagnosis not present

## 2020-05-06 DIAGNOSIS — Z79899 Other long term (current) drug therapy: Secondary | ICD-10-CM | POA: Diagnosis not present

## 2020-05-06 DIAGNOSIS — S51011A Laceration without foreign body of right elbow, initial encounter: Secondary | ICD-10-CM | POA: Insufficient documentation

## 2020-05-06 DIAGNOSIS — I10 Essential (primary) hypertension: Secondary | ICD-10-CM | POA: Insufficient documentation

## 2020-05-06 DIAGNOSIS — Z7289 Other problems related to lifestyle: Secondary | ICD-10-CM

## 2020-05-06 DIAGNOSIS — S59901A Unspecified injury of right elbow, initial encounter: Secondary | ICD-10-CM | POA: Diagnosis present

## 2020-05-06 LAB — CBC WITH DIFFERENTIAL/PLATELET
Abs Immature Granulocytes: 0.06 10*3/uL (ref 0.00–0.07)
Basophils Absolute: 0 10*3/uL (ref 0.0–0.1)
Basophils Relative: 0 %
Eosinophils Absolute: 0 10*3/uL (ref 0.0–0.5)
Eosinophils Relative: 0 %
HCT: 44.3 % (ref 39.0–52.0)
Hemoglobin: 14.9 g/dL (ref 13.0–17.0)
Immature Granulocytes: 0 %
Lymphocytes Relative: 7 %
Lymphs Abs: 1.1 10*3/uL (ref 0.7–4.0)
MCH: 27.9 pg (ref 26.0–34.0)
MCHC: 33.6 g/dL (ref 30.0–36.0)
MCV: 83 fL (ref 80.0–100.0)
Monocytes Absolute: 1.7 10*3/uL — ABNORMAL HIGH (ref 0.1–1.0)
Monocytes Relative: 11 %
Neutro Abs: 12.7 10*3/uL — ABNORMAL HIGH (ref 1.7–7.7)
Neutrophils Relative %: 82 %
Platelets: 450 10*3/uL — ABNORMAL HIGH (ref 150–400)
RBC: 5.34 MIL/uL (ref 4.22–5.81)
RDW: 15.9 % — ABNORMAL HIGH (ref 11.5–15.5)
WBC: 15.5 10*3/uL — ABNORMAL HIGH (ref 4.0–10.5)
nRBC: 0 % (ref 0.0–0.2)

## 2020-05-06 LAB — COMPREHENSIVE METABOLIC PANEL
ALT: 14 U/L (ref 0–44)
AST: 27 U/L (ref 15–41)
Albumin: 4 g/dL (ref 3.5–5.0)
Alkaline Phosphatase: 80 U/L (ref 38–126)
Anion gap: 14 (ref 5–15)
BUN: 18 mg/dL (ref 6–20)
CO2: 24 mmol/L (ref 22–32)
Calcium: 9 mg/dL (ref 8.9–10.3)
Chloride: 100 mmol/L (ref 98–111)
Creatinine, Ser: 1.21 mg/dL (ref 0.61–1.24)
GFR, Estimated: >60 mL/min (ref >60)
Glucose, Bld: 170 mg/dL — ABNORMAL HIGH (ref 70–99)
Potassium: 3.5 mmol/L (ref 3.5–5.1)
Sodium: 138 mmol/L (ref 135–145)
Total Bilirubin: 1.8 mg/dL — ABNORMAL HIGH (ref 0.3–1.2)
Total Protein: 7.3 g/dL (ref 6.5–8.1)

## 2020-05-06 LAB — RESP PANEL BY RT-PCR (FLU A&B, COVID) ARPGX2
Influenza A by PCR: NEGATIVE
Influenza B by PCR: NEGATIVE
SARS Coronavirus 2 by RT PCR: NEGATIVE

## 2020-05-06 LAB — ACETAMINOPHEN LEVEL: Acetaminophen (Tylenol), Serum: <10 ug/mL — ABNORMAL LOW (ref 10–30)

## 2020-05-06 LAB — SALICYLATE LEVEL: Salicylate Lvl: <7 mg/dL — ABNORMAL LOW (ref 7.0–30.0)

## 2020-05-06 LAB — ETHANOL: Alcohol, Ethyl (B): <10 mg/dL (ref <10)

## 2020-05-06 MED ORDER — METOPROLOL TARTRATE 25 MG PO TABS
50.0000 mg | ORAL_TABLET | Freq: Two times a day (BID) | ORAL | Status: DC
  Administered 2020-05-06 – 2020-05-07 (×3): 50 mg via ORAL
  Filled 2020-05-06 (×3): qty 2

## 2020-05-06 MED ORDER — SALINE SPRAY 0.65 % NA SOLN
1.0000 | NASAL | Status: DC | PRN
  Administered 2020-05-07 (×2): 1 via NASAL
  Filled 2020-05-06 (×2): qty 44

## 2020-05-06 MED ORDER — CLONIDINE HCL 0.1 MG PO TABS: 0.1000 mg | ORAL_TABLET | Freq: Every day | ORAL | Status: DC

## 2020-05-06 MED ORDER — LIDOCAINE-EPINEPHRINE (PF) 2 %-1:200000 IJ SOLN
INTRAMUSCULAR | Status: AC
  Filled 2020-05-06: qty 20

## 2020-05-06 MED ORDER — OXYCODONE HCL 5 MG PO TABS
15.0000 mg | ORAL_TABLET | Freq: Four times a day (QID) | ORAL | Status: DC
  Administered 2020-05-06 – 2020-05-07 (×4): 15 mg via ORAL
  Filled 2020-05-06 (×4): qty 3

## 2020-05-06 MED ORDER — HYDROXYZINE HCL 25 MG PO TABS
25.0000 mg | ORAL_TABLET | Freq: Four times a day (QID) | ORAL | Status: DC | PRN
  Administered 2020-05-06 (×2): 25 mg via ORAL
  Filled 2020-05-06 (×2): qty 1

## 2020-05-06 MED ORDER — DICYCLOMINE HCL 20 MG PO TABS: 20.0000 mg | ORAL_TABLET | Freq: Four times a day (QID) | ORAL | Status: DC | PRN

## 2020-05-06 MED ORDER — LORATADINE 10 MG PO TABS
10.0000 mg | ORAL_TABLET | Freq: Every day | ORAL | Status: DC
  Administered 2020-05-06 – 2020-05-07 (×2): 10 mg via ORAL
  Filled 2020-05-06 (×2): qty 1

## 2020-05-06 MED ORDER — DEXMETHYLPHENIDATE HCL 5 MG PO TABS
10.0000 mg | ORAL_TABLET | Freq: Two times a day (BID) | ORAL | Status: DC
  Filled 2020-05-06: qty 2

## 2020-05-06 MED ORDER — NICOTINE 21 MG/24HR TD PT24
21.0000 mg | MEDICATED_PATCH | Freq: Every day | TRANSDERMAL | Status: DC
  Administered 2020-05-07: 21 mg via TRANSDERMAL
  Filled 2020-05-06 (×2): qty 1

## 2020-05-06 MED ORDER — LIDOCAINE-EPINEPHRINE 2 %-1:100000 IJ SOLN: 20.0000 mL | Freq: Once | INTRAMUSCULAR | Status: DC

## 2020-05-06 MED ORDER — FLUOXETINE HCL 20 MG PO CAPS
40.0000 mg | ORAL_CAPSULE | Freq: Every day | ORAL | Status: DC
  Administered 2020-05-06 – 2020-05-07 (×2): 40 mg via ORAL
  Filled 2020-05-06 (×2): qty 2

## 2020-05-06 MED ORDER — BUSPIRONE HCL 10 MG PO TABS
10.0000 mg | ORAL_TABLET | Freq: Three times a day (TID) | ORAL | Status: DC
  Administered 2020-05-06 – 2020-05-07 (×4): 10 mg via ORAL
  Filled 2020-05-06 (×4): qty 1

## 2020-05-06 MED ORDER — OXYCODONE-ACETAMINOPHEN 5-325 MG PO TABS
2.0000 | ORAL_TABLET | Freq: Once | ORAL | Status: DC
  Filled 2020-05-06: qty 2

## 2020-05-06 MED ORDER — CLONIDINE HCL 0.1 MG PO TABS
0.1000 mg | ORAL_TABLET | ORAL | Status: DC
Start: 2020-05-08 — End: 2020-05-07

## 2020-05-06 MED ORDER — CLONIDINE HCL 0.1 MG PO TABS
0.1000 mg | ORAL_TABLET | Freq: Four times a day (QID) | ORAL | Status: DC
  Administered 2020-05-06 – 2020-05-07 (×5): 0.1 mg via ORAL
  Filled 2020-05-06 (×6): qty 1

## 2020-05-06 MED ORDER — PANTOPRAZOLE SODIUM 40 MG PO TBEC
40.0000 mg | DELAYED_RELEASE_TABLET | Freq: Two times a day (BID) | ORAL | Status: DC
  Administered 2020-05-06 – 2020-05-07 (×3): 40 mg via ORAL
  Filled 2020-05-06 (×3): qty 1

## 2020-05-06 MED ORDER — ONDANSETRON HCL 4 MG PO TABS: 4.0000 mg | ORAL_TABLET | Freq: Three times a day (TID) | ORAL | Status: DC | PRN

## 2020-05-06 MED ORDER — ONDANSETRON 4 MG PO TBDP: 4.0000 mg | ORAL_TABLET | Freq: Four times a day (QID) | ORAL | Status: DC | PRN

## 2020-05-06 MED ORDER — ALUM & MAG HYDROXIDE-SIMETH 200-200-20 MG/5ML PO SUSP
30.0000 mL | Freq: Four times a day (QID) | ORAL | Status: DC | PRN
  Administered 2020-05-07: 30 mL via ORAL
  Filled 2020-05-06: qty 30

## 2020-05-06 MED ORDER — LOPERAMIDE HCL 2 MG PO CAPS: 2.0000 mg | ORAL_CAPSULE | ORAL | Status: DC | PRN

## 2020-05-06 MED ORDER — TIZANIDINE HCL 4 MG PO TABS
4.0000 mg | ORAL_TABLET | Freq: Four times a day (QID) | ORAL | Status: DC
  Administered 2020-05-06 – 2020-05-07 (×5): 4 mg via ORAL
  Filled 2020-05-06 (×5): qty 1

## 2020-05-06 MED ORDER — ZOLPIDEM TARTRATE 5 MG PO TABS
5.0000 mg | ORAL_TABLET | Freq: Every evening | ORAL | Status: DC | PRN
  Administered 2020-05-06 (×2): 5 mg via ORAL
  Filled 2020-05-06 (×2): qty 1

## 2020-05-06 MED ORDER — METHOCARBAMOL 500 MG PO TABS: 500.0000 mg | ORAL_TABLET | Freq: Three times a day (TID) | ORAL | Status: DC | PRN

## 2020-05-06 MED ORDER — OXYCODONE HCL 5 MG PO TABS
15.0000 mg | ORAL_TABLET | Freq: Once | ORAL | Status: AC
  Administered 2020-05-06: 15 mg via ORAL
  Filled 2020-05-06: qty 3

## 2020-05-06 MED ORDER — CLONIDINE HCL 0.1 MG PO TABS
0.1000 mg | ORAL_TABLET | Freq: Four times a day (QID) | ORAL | Status: DC
  Administered 2020-05-06: 0.1 mg via ORAL
  Filled 2020-05-06: qty 1

## 2020-05-06 MED ORDER — CLONIDINE HCL 0.1 MG PO TABS
0.1000 mg | ORAL_TABLET | Freq: Two times a day (BID) | ORAL | Status: DC
Start: 2020-05-08 — End: 2020-05-06

## 2020-05-06 MED ORDER — NAPROXEN 500 MG PO TABS: 500.0000 mg | ORAL_TABLET | Freq: Two times a day (BID) | ORAL | Status: DC | PRN

## 2020-05-06 NOTE — ED Notes (Signed)
Pt belongings placed in 1 bag. Including his 1 shirt, rings, necklace, and watch.

## 2020-05-06 NOTE — ED Notes (Signed)
Pt accidentally broke shower handle. Pt moved to another room for maintenance to fix shower handle. Pt right arm laceration redressed after shower. Wound is not draining, stitches in place. Dressing clean dry and intact

## 2020-05-06 NOTE — ED Provider Notes (Signed)
[facility]-EMERGENCY DEPT Provider Note   CSN: [id number] Arrival date & time: 05/06/20  2947     History Chief Complaint  Patient presents with  . Suicidal  . Laceration    Todd [name] is a 50 y.o. male.  The history is provided by the patient and medical records. No language interpreter was used.  Laceration    50 year old male significant history of polysubstance abuse, hypertension, brought here via EMS with suicidal attempt.  Patient report for the past 3 days he has been without his usual opiate pain medication because his provider supposedly had the refill date incorrectly.  Since been without his chronic pain medication he endorsed worsening of his chronic right hip pain, now body aches, and reported feeling very frustrated.  This morning he attempted to end his life by cutting his dominant right arm.  At this time he reports severe sharp pain at the affected site.  Denies any associated radiation no numbness he denies significant bleeding.  He denies homicidal ideation, auditory or visual hallucination.  He report he is up-to-date with tetanus.  Past Medical History:  Diagnosis Date  . Drug addiction (HCC)   . 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 GERD (gastroesophageal reflux disease)   . H/O hiatal hernia   . Hypertension     Patient Active Problem List   Diagnosis Date Noted  . 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 Closed dislocation of right hip (HCC) 04/29/2019  . Hypoxia 10/07/2015  . Mechanical complication of internal orthopedic device (HCC) 03/16/2015  . 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 Anemia 07/16/2014  . Altered mental status 10/20/2013  . Dislocation of hip prosthesis (HCC) 10/20/2013  . CAP (community acquired pneumonia) 10/20/2013  . Acute GI bleeding 09/19/2013  . Acute blood loss anemia  09/19/2013  . Essential hypertension 09/19/2013  . Sinus tachycardia 09/19/2013  . GASTROESOPHAGEAL REFLUX, NO ESOPHAGITIS 05/06/2006    Past Surgical History:  Procedure Laterality Date  . APPENDECTOMY     as child  . COLONOSCOPY WITH PROPOFOL  02/17/2012   Procedure: COLONOSCOPY WITH PROPOFOL;  Surgeon: Willis Modena, MD;  Location: [facility] ENDOSCOPY;  Service: Endoscopy;  Laterality: N/A;  . ESOPHAGOGASTRODUODENOSCOPY Left 09/19/2013   Procedure: ESOPHAGOGASTRODUODENOSCOPY (EGD);  Surgeon: Willis Modena, MD;  Location: [facility] ENDOSCOPY;  Service: Endoscopy;  Laterality: Left;  . ESOPHAGOGASTRODUODENOSCOPY (EGD) WITH PROPOFOL  02/17/2012   Procedure: ESOPHAGOGASTRODUODENOSCOPY (EGD) WITH PROPOFOL;  Surgeon: Willis Modena, MD;  Location: [facility] ENDOSCOPY;  Service: Endoscopy;  Laterality: N/A;  . HIP CLOSED REDUCTION Right 10/20/2013   Procedure: CLOSED REDUCTION HIP;  Surgeon: Eldred Manges, MD;  Location: [facility] OR;  Service: Orthopedics;  Laterality: Right;  . HIP CLOSED REDUCTION Right 02/18/2015   Procedure: CLOSED REDUCTION HIP;  Surgeon: Jodi Geralds, MD;  Location: [facility] ORS;  Service: Orthopedics;  Laterality: Right;  . HIP CLOSED REDUCTION Right 03/16/2015   Procedure: CLOSED REDUCTION HIP;  Surgeon: Kathryne Hitch, MD;  Location: [facility] OR;  Service: Orthopedics;  Laterality: Right;  . HIP CLOSED REDUCTION Right 04/29/2019   Procedure: CLOSED MANIPULATION HIP;  Surgeon: Teryl Lucy, MD;  Location: [facility] ORS;  Service: Orthopedics;  Laterality: Right;  . HIP CLOSED REDUCTION Right 05/14/2019   Procedure: CLOSED REDUCTION HIP;  Surgeon: Deeann Saint, MD;  Location: [facility] ORS;  Service: Orthopedics;  Laterality: Right;  . HIP CLOSED REDUCTION Right  05/22/2019   Procedure: CLOSED MANIPULATION HIP;  Surgeon: Teryl LucyLandau, Joshua, MD;  Location: [facility] ORS;  Service: Orthopedics;  Laterality: Right;  . JOINT REPLACEMENT  2005   Right Hip       Family History  Problem Relation Age of Onset  . Diabetes  Mother     Social History   Tobacco Use  . Smoking status: Never Smoker  . Smokeless tobacco: Never Used  Vaping Use  . Vaping Use: Never used  Substance Use Topics  . Alcohol use: No  . Drug use: Not Currently    Types: IV    Home Medications Prior to Admission medications   Medication Sig Start Date End Date Taking? Authorizing Provider  amoxicillin (AMOXIL) 500 MG capsule Take 1 capsule (500 mg total) by mouth 3 (three) times daily. 05/21/19   Rolan BuccoBelfi, Melanie, MD  busPIRone (BUSPAR) 10 MG tablet Take 10 mg by mouth 3 (three) times daily. 03/20/19   [provider]  dexmethylphenidate (FOCALIN) 10 MG tablet Take 10 mg by mouth 2 (two) times daily. 04/07/19   [provider]  FLUoxetine (PROZAC) 40 MG capsule Take 40 mg by mouth daily.    [provider]  loratadine (CLARITIN) 10 MG tablet Take 10 mg by mouth daily.     [provider]  metoprolol (LOPRESSOR) 50 MG tablet Take 50 mg by mouth 2 (two) times daily.     [provider]  Multiple Vitamins-Minerals (MENS MULTIVITAMIN) TABS Take 1 tablet by mouth daily.    [provider]  ondansetron (ZOFRAN) 4 MG tablet Take 4 mg by mouth every 8 (eight) hours as needed for nausea or vomiting.  11/26/18   [provider]  oxyCODONE (ROXICODONE) 15 MG immediate release tablet Take 15 mg by mouth every 6 (six) hours.  01/13/19   [provider]  oxymetazoline (AFRIN) 0.05 % nasal spray Place 1 spray into both nostrils 2 (two) times daily as needed for congestion.    [provider]  pantoprazole (PROTONIX) 40 MG tablet Take 40 mg by mouth 2 (two) times daily.  03/22/19   [provider]  promethazine (PHENERGAN) 25 MG tablet Take 25 mg by mouth at bedtime as needed for nausea/vomiting. 03/20/19   [provider]  testosterone cypionate (DEPOTESTOSTERONE CYPIONATE) 200 MG/ML injection Inject 200 mg into the muscle every 14 (fourteen) days.  11/11/18    [provider]  tiZANidine (ZANAFLEX) 4 MG tablet Take 4 mg by mouth every 6 (six) hours.     [provider]  triamcinolone ointment (KENALOG) 0.1 % Apply 1 application topically 2 (two) times daily as needed (psoriasis).    [provider]    Allergies    Ambien [zolpidem tartrate], Nu-iron [polysaccharide iron complex], and Fentanyl  Review of Systems   Review of Systems  All other systems reviewed and are negative.   Physical Exam Updated Vital Signs BP (!) 171/106 (BP Location: Right Arm)   Pulse (!) 126   Temp 98.2 F (36.8 C) (Oral)   Resp (!) 22   SpO2 99%   Physical Exam Vitals and nursing note reviewed.  Constitutional:      General: He is not in acute distress.    Appearance: He is well-developed and well-nourished.  HENT:     Head: Atraumatic.  Eyes:     Conjunctiva/sclera: Conjunctivae normal.  Cardiovascular:     Rate and Rhythm: Tachycardia present.     Pulses: Normal pulses.  Heart sounds: Normal heart sounds.  Pulmonary:     Effort: Pulmonary effort is normal.     Breath sounds: Normal breath sounds.  Abdominal:     Palpations: Abdomen is soft.  Musculoskeletal:        General: Signs of injury (R arm: 5cm deep horizontal laceration distal to R antecubital region without nerve, muscle or vascular involvement.) present.     Cervical back: Neck supple.  Skin:    Findings: No rash.  Neurological:     Mental Status: He is alert.  Psychiatric:        Mood and Affect: Mood and affect normal.     ED Results / Procedures / Treatments   Labs (all labs ordered are listed, but only abnormal results are displayed) Labs Reviewed  ACETAMINOPHEN LEVEL - Abnormal; Notable for the following components:      Result Value   Acetaminophen (Tylenol), Serum <10 (*)    All other components within normal limits  COMPREHENSIVE METABOLIC PANEL - Abnormal; Notable for the following components:   Glucose, Bld 170 (*)    Total Bilirubin  1.8 (*)    All other components within normal limits  SALICYLATE LEVEL - Abnormal; Notable for the following components:   Salicylate Lvl <7.0 (*)    All other components within normal limits  CBC WITH DIFFERENTIAL/PLATELET - Abnormal; Notable for the following components:   WBC 15.5 (*)    RDW 15.9 (*)    Platelets 450 (*)    Neutro Abs 12.7 (*)    Monocytes Absolute 1.7 (*)    All other components within normal limits  RESP PANEL BY RT-PCR (FLU A&B, COVID) ARPGX2  ETHANOL  RAPID URINE DRUG SCREEN, HOSP PERFORMED    EKG None  Radiology No results found.  Procedures .Marland KitchenLaceration Repair  Date/Time: 05/06/2020 8:35 AM Performed by: Fayrene Helper, PA-C Authorized by: Fayrene Helper, PA-C   Consent:    Consent obtained:  Verbal   Consent given by:  Patient   Risks discussed:  Infection, need for additional repair, pain, poor cosmetic result and poor wound healing   Alternatives discussed:  No treatment and delayed treatment Universal protocol:    Procedure explained and questions answered to patient or proxy's satisfaction: yes     Relevant documents present and verified: yes     Test results available: yes     Imaging studies available: yes     Required blood products, implants, devices, and special equipment available: yes     Site/side marked: yes     Immediately prior to procedure, a time out was called: yes     Patient identity confirmed:  Verbally with patient Anesthesia:    Anesthesia method:  Local infiltration Laceration details:    Location:  Shoulder/arm   Shoulder/arm location:  R elbow   Length (cm):  5   Depth (mm):  10 Pre-procedure details:    Preparation:  Patient was prepped and draped in usual sterile fashion Exploration:    Limited defect created (wound extended): no     Hemostasis achieved with:  Epinephrine   Imaging outcome: foreign body not noted     Wound exploration: wound explored through full range of motion and entire depth of wound  visualized     Wound extent: fascia violated and muscle damage     Wound extent: no areolar tissue violation noted, no foreign bodies/material noted, no nerve damage noted, no tendon damage noted, no underlying fracture noted and no vascular damage noted  Contaminated: no   Treatment:    Area cleansed with:  Saline and povidone-iodine   Amount of cleaning:  Standard   Irrigation solution:  Sterile saline   Irrigation method:  Pressure wash   Visualized foreign bodies/material removed: no     Debridement:  Minimal   Undermining:  Minimal   Scar revision: no     Layers/structures repaired:  Muscle belly Muscle belly:    Suture size:  5-0   Suture material:  Plain gut   Suture technique:  Simple interrupted   Number of sutures:  2 Skin repair:    Repair method:  Sutures   Suture size:  4-0   Suture material:  Prolene   Suture technique:  Simple interrupted   Number of sutures:  9 Approximation:    Approximation:  Close Repair type:    Repair type:  Complex Post-procedure details:    Dressing:  Non-adherent dressing   Procedure completion:  Tolerated well, no immediate complications .Marland KitchenLaceration Repair  Date/Time: 05/06/2020 8:40 AM Performed by: Fayrene Helper, PA-C Authorized by: Fayrene Helper, PA-C   Consent:    Consent obtained:  Verbal   Consent given by:  Patient   Risks discussed:  Infection, need for additional repair, pain, poor cosmetic result and poor wound healing   Alternatives discussed:  No treatment and delayed treatment Universal protocol:    Procedure explained and questions answered to patient or proxy's satisfaction: yes     Relevant documents present and verified: yes     Test results available: yes     Imaging studies available: yes     Required blood products, implants, devices, and special equipment available: yes     Site/side marked: yes     Immediately prior to procedure, a time out was called: yes     Patient identity confirmed:  Verbally with  patient Anesthesia:    Anesthesia method:  Local infiltration Laceration details:    Location:  Shoulder/arm   Shoulder/arm location:  R lower arm   Length (cm):  3   Depth (mm):  2 Pre-procedure details:    Preparation:  Patient was prepped and draped in usual sterile fashion Exploration:    Limited defect created (wound extended): no     Imaging outcome: foreign body not noted     Wound exploration: wound explored through full range of motion and entire depth of wound visualized     Wound extent: no muscle damage noted and no vascular damage noted     Contaminated: no   Treatment:    Area cleansed with:  Povidone-iodine and saline   Amount of cleaning:  Standard   Irrigation solution:  Sterile saline   Irrigation method:  Pressure wash   Debridement:  None Skin repair:    Repair method:  Sutures   Suture size:  4-0   Suture material:  Prolene   Number of sutures:  2 Approximation:    Approximation:  Close Repair type:    Repair type:  Simple Post-procedure details:    Dressing:  Non-adherent dressing   Procedure completion:  Tolerated well, no immediate complications     Medications Ordered in ED Medications  lidocaine-EPINEPHrine (XYLOCAINE W/EPI) 2 %-1:100000 (with pres) injection 20 mL (has no administration in time range)  lidocaine-EPINEPHrine (XYLOCAINE W/EPI) 2 %-1:200000 (PF) injection (has no administration in time range)  dicyclomine (BENTYL) tablet 20 mg (has no administration in time range)  hydrOXYzine (ATARAX/VISTARIL) tablet 25 mg (25 mg Oral Given 05/06/20 0954)  loperamide (IMODIUM) capsule 2-4 mg (has no administration in time range)  ondansetron (ZOFRAN) tablet 4 mg (has no administration in time range)  alum & mag hydroxide-simeth (MAALOX/MYLANTA) 200-200-20 MG/5ML suspension 30 mL (has no administration in time range)  nicotine (NICODERM CQ - dosed in mg/24 hours) patch 21 mg (21 mg Transdermal Not Given 05/06/20 0934)  zolpidem (AMBIEN) tablet 5 mg  (5 mg Oral Given 05/06/20 0909)  busPIRone (BUSPAR) tablet 10 mg (10 mg Oral Given 05/06/20 0955)  dexmethylphenidate (FOCALIN) tablet 10 mg (has no administration in time range)  FLUoxetine (PROZAC) capsule 40 mg (has no administration in time range)  loratadine (CLARITIN) tablet 10 mg (10 mg Oral Given 05/06/20 0954)  metoprolol tartrate (LOPRESSOR) tablet 50 mg (50 mg Oral Given 05/06/20 0954)  oxyCODONE (Oxy IR/ROXICODONE) immediate release tablet 15 mg (has no administration in time range)  pantoprazole (PROTONIX) EC tablet 40 mg (40 mg Oral Given 05/06/20 0955)  tiZANidine (ZANAFLEX) tablet 4 mg (4 mg Oral Given 05/06/20 0955)  oxyCODONE (Oxy IR/ROXICODONE) immediate release tablet 15 mg (15 mg Oral Given 05/06/20 1610)    ED Course  I have reviewed the triage vital signs and the nursing notes.  Pertinent labs & imaging results that were available during my care of the patient were reviewed by me and considered in my medical decision making (see chart for details).    MDM Rules/Calculators/A&P                          BP 134/83 (BP Location: Left Arm)   Pulse 83   Temp 98.2 F (36.8 C) (Oral)   Resp 18   SpO2 96%   Final Clinical Impression(s) / ED Diagnoses Final diagnoses:  Suicide attempt (HCC)  Elbow laceration, right, initial encounter  Opioid dependence with opioid-induced mood disorder (HCC)    Rx / DC Orders ED Discharge Orders    None     8:41 AM Patient here with suicidal ideation with intent self-harm. He suffered from a self-inflicted wound to his R elbow when cut himself earlier this AM due to being without his chronic opiate pain meds x 3 days.  Denies HI.  Denies recent alcohol or other drug use.  He has a deep 5cm horizontal lac to R elbow without vessel involvement or nerve involvement.  He did cut the belly of his bicep that was sutured by me.  Sutures will need to be remove in 7-10 days.  Will perform medical screen and request TTS for further psych  management. Care discussed with Dr. Estell Harpin.   I have performed first exam as well as IVC paper.  2:35 PM Patient is medically cleared and will be assessed further by TTS.   Fayrene Helper, PA-C 05/06/20 1435    Bethann Berkshire, MD 05/06/20 [phone]

## 2020-05-06 NOTE — ED Triage Notes (Signed)
Per EMS, pt tried to kill self with kitchen knife, has laceration to to right anterior forearm. He ran out of oxycodone 3 days ago and says he is going through withdrawal. Pt uncooperative with EMS, walking out of room asking for medicine.   BP 180/110 HR110

## 2020-05-06 NOTE — ED Notes (Signed)
Pt in bathroom taking a shower

## 2020-05-06 NOTE — ED Notes (Addendum)
Pt is very drug seeking. His BP dropped to 87 systolic x2 prior to clonidine 0.1 administration at 1800.  It required drinking water, eating dinner and moving around the room to come up t 121/79.  Pt was impatient and wanted the clonidine immediately.  Now he wants the oxycodone that is scheduled for 2000 and is argumentative about that, demanding it early.

## 2020-05-07 ENCOUNTER — Encounter (HOSPITAL_COMMUNITY): Payer: Self-pay | Admitting: Registered Nurse

## 2020-05-07 DIAGNOSIS — Z7289 Other problems related to lifestyle: Secondary | ICD-10-CM

## 2020-05-07 DIAGNOSIS — F1124 Opioid dependence with opioid-induced mood disorder: Secondary | ICD-10-CM | POA: Insufficient documentation

## 2020-05-07 DIAGNOSIS — F430 Acute stress reaction: Secondary | ICD-10-CM

## 2020-05-07 DIAGNOSIS — R4588 Nonsuicidal self-harm: Secondary | ICD-10-CM | POA: Diagnosis present

## 2020-05-07 LAB — CBG MONITORING, ED: Glucose-Capillary: 129 mg/dL — ABNORMAL HIGH (ref 70–99)

## 2020-05-07 MED ORDER — MELATONIN 3 MG PO TABS
3.0000 mg | ORAL_TABLET | Freq: Every day | ORAL | Status: DC
  Administered 2020-05-07: 3 mg via ORAL
  Filled 2020-05-07: qty 1

## 2020-05-07 MED ORDER — OXYCODONE HCL 15 MG PO TABS: 15.0000 mg | ORAL_TABLET | Freq: Four times a day (QID) | ORAL | 0 refills | Status: DC | PRN

## 2020-05-07 NOTE — ED Notes (Signed)
Pt states, "I am trying not to cause a scene. I just want to go to sleep. I know if I cause a scene, I will get held down and given medication. I don't want to do that. I just want to go to sleep."
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
## 2020-05-07 NOTE — ED Notes (Signed)
Pt DC off unit to home per provider. Pt alert, cooperative, no s/s of distress.DC information and resources given to and reviewed with pt, pt acknowledged understanding. Pt ambulatory off unit, escorted by SW.

## 2020-05-07 NOTE — BH Assessment (Addendum)
Nira Conn, NP, patient meets inpatient criteria. Everardo Pacific, AC, no appropriate beds at [facility]. Disposition SW will secure placement in the AM.  1:1 NEEDED

## 2020-05-07 NOTE — ED Notes (Addendum)
Pt requested medication for sleep once again and demanded to see a doctor. Pt is agitated. He stated he would "freak out" if a doctor did not come see him and help him get some sleep. States he has been awake for 3 days straight. Provider notified.

## 2020-05-07 NOTE — ED Notes (Signed)
Pt requesting medication for sleep again

## 2020-05-07 NOTE — TOC Initial Note (Signed)
Transition of Care [facility]) - Initial/Assessment Note    Patient Details  Name: Todd [name] MRN: [id number] Date of Birth: September 18, 1970  [facility]) CM/SW Contact:    Elliot Cousin, RN Phone Number: [phone] 05/07/2020, 12:27 PM  Clinical Narrative:                  TOC CM spoke to pt at bedside. States his son is on the way to drop off his clothes and phone. States he has a single wide trailer but needs some work done. Pt states he was staying with son. [employer] waiver signed and emailed to transportation. Provided him CM number to arrange transportation if his son does not provide him a ride.    Barriers to Discharge: No Barriers Identified   Patient Goals and CMS Choice        Expected Discharge Plan and Services                                                Prior Living Arrangements/Services                       Activities of Daily Living      Permission Sought/Granted                  Emotional Assessment              Admission diagnosis:  si;laceration Patient Active Problem List   Diagnosis Date Noted   Self-injurious behavior 05/07/2020   Non-suicidal self-harm 05/07/2020   Acute stress reaction causing mixed disturbance of emotion and conduct 05/07/2020   Opioid dependence with opioid-induced mood disorder (HCC)    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 05/14/2019   Closed dislocation of right hip (HCC) 04/29/2019   Hypoxia 10/07/2015   Mechanical complication of internal orthopedic device (HCC) 03/16/2015   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   Anemia 07/16/2014   Altered mental status 10/20/2013   Dislocation of hip prosthesis (HCC) 10/20/2013   CAP (community acquired pneumonia) 10/20/2013   Acute GI bleeding 09/19/2013   Acute blood loss anemia 09/19/2013   Essential hypertension 09/19/2013   Sinus  tachycardia 09/19/2013   GASTROESOPHAGEAL REFLUX, NO ESOPHAGITIS 05/06/2006   PCP:  [facility] Pharmacy:   [id number] - [place] - [address] AT [facility] [address] [place] [address] Phone: [phone] Fax: [phone]     Social Determinants of Health (SDOH) Interventions    Readmission Risk Interventions No flowsheet data found.

## 2020-05-07 NOTE — ED Notes (Signed)
Pt speaking with TTS 

## 2020-05-07 NOTE — ED Notes (Signed)
Patient received lunch tray Patient given blue scrubs for discharge/sandals

## 2020-05-07 NOTE — BH Assessment (Addendum)
BHH Assessment Progress Note  Per Shuvon Rankin, NP, this pt does not require psychiatric hospitalization at this time.  Pt presents under IVC initiated by EDP Bethann Berkshire, MD which has been rescinded by Nelly Rout, MD.  Pt is psychiatrically cleared.  Discharge instructions include referral information for area providers of psychiatry and therapy.  EDP Lorre Nick, MD and pt's nurse, Waynetta Sandy, have been notified.  Doylene Canning, MA Triage Specialist [phone]  Addendum:  Per Denice Bors, pt has requested that a call be placed to his outpatient provider, Courtney Paris, FNP, at [facility] on [address].  At 11:48 this Clinical research associate called them to notify them of this encounter at [facility].  They note that pt does not currently have an appointment scheduled, which he will need in order to have prescriptions refilled.  Recommendation to follow up with [facility] has been added to pt's discharge instructions, including the fact that they offer psychiatry as well.  Dr Freida Busman, Denice Bors, and Beth have been notified.  Doylene Canning, [place] Behavioral Health Coordinator [phone]
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
## 2020-05-07 NOTE — ED Notes (Signed)
Pt requesting medication for sleep

## 2020-05-07 NOTE — ED Notes (Signed)
Patient asleep rise and fall of chest breathing observed. Sitter at bedside. 

## 2020-05-07 NOTE — ED Notes (Signed)
Pt is requesting medication for sleep. Pt has already received available PRN's for sleep and anxiety. Provider notified

## 2020-05-07 NOTE — ED Provider Notes (Addendum)
Emergency Medicine Observation Re-evaluation Note  Todd [name] is a 50 y.o. male, seen on rounds today.  Pt initially presented to the ED for complaints of Suicidal and Laceration Currently, the patient is placement.  Physical Exam  BP 130/85 (BP Location: Left Arm)   Pulse 85   Temp 98.2 F (36.8 C) (Oral)   Resp 20   SpO2 98%  Physical Exam General: calm   ED Course / MDM  EKG:    I have reviewed the labs performed to date as well as medications administered while in observation.  Recent changes in the last 24 hours include none.  Plan  Current plan is for placement.  11:28 AM Patient has been cleared by psychiatry for discharge.  Patient requesting 3 days of home opiate medications.  PMP database reviewed and patient does receive this on a regular basis.  Patient told that this will be a one-time order for his opiates.    Lorre Nick, MD 05/07/20 4536    Lorre Nick, MD 05/07/20 [phone]

## 2020-05-07 NOTE — Discharge Instructions (Addendum)
For your primary care needs, including prescription refills, you are advised to continue treatment with your current outpatient provider, Courtney Paris, FNP, at [facility].  You do not currently have an appointment scheduled with her:       Courtney Paris, FNP      [facility]      [address] [place], [place] [address]      [phone]  For your behavioral health needs, you are advised to follow up with an outpatient psychiatry provider.  [facility] offers this specialty their patients.  As an alternative, the following providers in the community also offer psychiatry.  Contact one at your earliest opportunity to schedule an intake appointment:       [facility]. [facility]. [address]      [place], [place] [address]      [phone]       [facility]      [address]      [place], [place] [address]      [phone]       [facility]      [address], [address]      [place], [place] [address]      [phone]

## 2020-05-21 ENCOUNTER — Inpatient Hospital Stay (HOSPITAL_COMMUNITY): Payer: Medicare (Managed Care)

## 2020-05-21 ENCOUNTER — Other Ambulatory Visit: Payer: Self-pay

## 2020-05-21 ENCOUNTER — Inpatient Hospital Stay (HOSPITAL_COMMUNITY)
Admission: EM | Admit: 2020-05-21 | Discharge: 2020-05-23 | DRG: 193 | Disposition: A | Discharge status: Home / Self Care | Payer: Medicare (Managed Care) | Attending: Internal Medicine | Admitting: Internal Medicine

## 2020-05-21 ENCOUNTER — Emergency Department (HOSPITAL_COMMUNITY): Payer: Medicare (Managed Care)

## 2020-05-21 ENCOUNTER — Observation Stay (HOSPITAL_COMMUNITY): Payer: Medicare (Managed Care)

## 2020-05-21 DIAGNOSIS — R55 Syncope and collapse: Secondary | ICD-10-CM | POA: Diagnosis present

## 2020-05-21 DIAGNOSIS — J189 Pneumonia, unspecified organism: Principal | ICD-10-CM | POA: Diagnosis present

## 2020-05-21 DIAGNOSIS — Z8701 Personal history of pneumonia (recurrent): Secondary | ICD-10-CM | POA: Diagnosis not present

## 2020-05-21 DIAGNOSIS — K219 Gastro-esophageal reflux disease without esophagitis: Secondary | ICD-10-CM | POA: Diagnosis present

## 2020-05-21 DIAGNOSIS — Z5900 Homelessness unspecified: Secondary | ICD-10-CM | POA: Diagnosis not present

## 2020-05-21 DIAGNOSIS — Z20822 Contact with and (suspected) exposure to covid-19: Secondary | ICD-10-CM | POA: Diagnosis present

## 2020-05-21 DIAGNOSIS — Z79891 Long term (current) use of opiate analgesic: Secondary | ICD-10-CM

## 2020-05-21 DIAGNOSIS — Z888 Allergy status to other drugs, medicaments and biological substances status: Secondary | ICD-10-CM

## 2020-05-21 DIAGNOSIS — E8770 Fluid overload, unspecified: Secondary | ICD-10-CM | POA: Diagnosis present

## 2020-05-21 DIAGNOSIS — Z885 Allergy status to narcotic agent status: Secondary | ICD-10-CM

## 2020-05-21 DIAGNOSIS — F900 Attention-deficit hyperactivity disorder, predominantly inattentive type: Secondary | ICD-10-CM | POA: Diagnosis present

## 2020-05-21 DIAGNOSIS — R0600 Dyspnea, unspecified: Secondary | ICD-10-CM | POA: Diagnosis not present

## 2020-05-21 DIAGNOSIS — I10 Essential (primary) hypertension: Secondary | ICD-10-CM | POA: Diagnosis present

## 2020-05-21 DIAGNOSIS — I340 Nonrheumatic mitral (valve) insufficiency: Secondary | ICD-10-CM

## 2020-05-21 DIAGNOSIS — J9601 Acute respiratory failure with hypoxia: Secondary | ICD-10-CM | POA: Diagnosis present

## 2020-05-21 DIAGNOSIS — Z23 Encounter for immunization: Secondary | ICD-10-CM | POA: Diagnosis present

## 2020-05-21 DIAGNOSIS — G8929 Other chronic pain: Secondary | ICD-10-CM | POA: Diagnosis present

## 2020-05-21 DIAGNOSIS — Z79899 Other long term (current) drug therapy: Secondary | ICD-10-CM

## 2020-05-21 LAB — CBC WITH DIFFERENTIAL/PLATELET
Abs Immature Granulocytes: 0.04 10*3/uL (ref 0.00–0.07)
Basophils Absolute: 0.1 10*3/uL (ref 0.0–0.1)
Basophils Relative: 1 %
Eosinophils Absolute: 0.3 10*3/uL (ref 0.0–0.5)
Eosinophils Relative: 2 %
HCT: 41.5 % (ref 39.0–52.0)
Hemoglobin: 13.2 g/dL (ref 13.0–17.0)
Immature Granulocytes: 0 %
Lymphocytes Relative: 3 %
Lymphs Abs: 0.4 10*3/uL — ABNORMAL LOW (ref 0.7–4.0)
MCH: 28.6 pg (ref 26.0–34.0)
MCHC: 31.8 g/dL (ref 30.0–36.0)
MCV: 90 fL (ref 80.0–100.0)
Monocytes Absolute: 0.7 10*3/uL (ref 0.1–1.0)
Monocytes Relative: 5 %
Neutro Abs: 11.3 10*3/uL — ABNORMAL HIGH (ref 1.7–7.7)
Neutrophils Relative %: 89 %
Platelets: 458 10*3/uL — ABNORMAL HIGH (ref 150–400)
RBC: 4.61 MIL/uL (ref 4.22–5.81)
RDW: 15.7 % — ABNORMAL HIGH (ref 11.5–15.5)
WBC: 12.8 10*3/uL — ABNORMAL HIGH (ref 4.0–10.5)
nRBC: 0 % (ref 0.0–0.2)

## 2020-05-21 LAB — COMPREHENSIVE METABOLIC PANEL
ALT: 22 U/L (ref 0–44)
AST: 23 U/L (ref 15–41)
Albumin: 3.1 g/dL — ABNORMAL LOW (ref 3.5–5.0)
Alkaline Phosphatase: 94 U/L (ref 38–126)
Anion gap: 6 (ref 5–15)
BUN: 10 mg/dL (ref 6–20)
CO2: 28 mmol/L (ref 22–32)
Calcium: 8.5 mg/dL — ABNORMAL LOW (ref 8.9–10.3)
Chloride: 101 mmol/L (ref 98–111)
Creatinine, Ser: 1.2 mg/dL (ref 0.61–1.24)
GFR, Estimated: >60 mL/min (ref >60)
Glucose, Bld: 224 mg/dL — ABNORMAL HIGH (ref 70–99)
Potassium: 4 mmol/L (ref 3.5–5.1)
Sodium: 135 mmol/L (ref 135–145)
Total Bilirubin: 0.5 mg/dL (ref 0.3–1.2)
Total Protein: 6.4 g/dL — ABNORMAL LOW (ref 6.5–8.1)

## 2020-05-21 LAB — C-REACTIVE PROTEIN: CRP: 7.4 mg/dL — ABNORMAL HIGH (ref <1.0)

## 2020-05-21 LAB — SARS CORONAVIRUS 2 (TAT 6-24 HRS): SARS Coronavirus 2: NEGATIVE

## 2020-05-21 LAB — TROPONIN I (HIGH SENSITIVITY)
Troponin I (High Sensitivity): 17 ng/L (ref <18)
Troponin I (High Sensitivity): 12 ng/L (ref <18)
Troponin I (High Sensitivity): 10 ng/L (ref <18)

## 2020-05-21 LAB — T4, FREE: Free T4: 1.22 ng/dL — ABNORMAL HIGH (ref 0.61–1.12)

## 2020-05-21 LAB — ECHOCARDIOGRAM COMPLETE
Area-P 1/2: 3.12 cm2
Height: 74 in
S' Lateral: 3.8 cm
Weight: 2756.8 oz

## 2020-05-21 LAB — LACTIC ACID, PLASMA
Lactic Acid, Venous: 1.6 mmol/L (ref 0.5–1.9)
Lactic Acid, Venous: 1.6 mmol/L (ref 0.5–1.9)

## 2020-05-21 LAB — SEDIMENTATION RATE: Sed Rate: 28 mm/hr — ABNORMAL HIGH (ref 0–16)

## 2020-05-21 LAB — APTT: aPTT: 31 seconds (ref 24–36)

## 2020-05-21 LAB — PROTIME-INR
INR: 1.1 (ref 0.8–1.2)
Prothrombin Time: 14 seconds (ref 11.4–15.2)

## 2020-05-21 LAB — BRAIN NATRIURETIC PEPTIDE: B Natriuretic Peptide: 482.4 pg/mL — ABNORMAL HIGH (ref 0.0–100.0)

## 2020-05-21 LAB — TSH: TSH: 0.104 u[IU]/mL — ABNORMAL LOW (ref 0.350–4.500)

## 2020-05-21 LAB — MAGNESIUM: Magnesium: 1.9 mg/dL (ref 1.7–2.4)

## 2020-05-21 LAB — HIV ANTIBODY (ROUTINE TESTING W REFLEX): HIV Screen 4th Generation wRfx: NONREACTIVE

## 2020-05-21 MED ORDER — AMLODIPINE BESYLATE 5 MG PO TABS
5.0000 mg | ORAL_TABLET | Freq: Every day | ORAL | Status: DC
  Administered 2020-05-21 – 2020-05-23 (×3): 5 mg via ORAL
  Filled 2020-05-21 (×3): qty 1

## 2020-05-21 MED ORDER — HYDRALAZINE HCL 50 MG PO TABS: 50.0000 mg | ORAL_TABLET | Freq: Three times a day (TID) | ORAL | Status: DC

## 2020-05-21 MED ORDER — ZOLPIDEM TARTRATE 5 MG PO TABS
5.0000 mg | ORAL_TABLET | Freq: Every evening | ORAL | Status: DC | PRN
  Administered 2020-05-21 – 2020-05-22 (×2): 5 mg via ORAL
  Filled 2020-05-21 (×2): qty 1

## 2020-05-21 MED ORDER — DEXMETHYLPHENIDATE HCL 5 MG PO TABS
10.0000 mg | ORAL_TABLET | Freq: Two times a day (BID) | ORAL | Status: DC | PRN
  Filled 2020-05-21: qty 2

## 2020-05-21 MED ORDER — HYDRALAZINE HCL 25 MG PO TABS
25.0000 mg | ORAL_TABLET | Freq: Three times a day (TID) | ORAL | Status: DC | PRN
  Administered 2020-05-21: 25 mg via ORAL
  Filled 2020-05-21 (×2): qty 1

## 2020-05-21 MED ORDER — METOPROLOL TARTRATE 50 MG PO TABS
50.0000 mg | ORAL_TABLET | Freq: Two times a day (BID) | ORAL | Status: DC
  Administered 2020-05-21 – 2020-05-23 (×2): 50 mg via ORAL
  Filled 2020-05-21 (×3): qty 1

## 2020-05-21 MED ORDER — ACETAMINOPHEN 650 MG RE SUPP
650.0000 mg | Freq: Four times a day (QID) | RECTAL | Status: DC | PRN
  Filled 2020-05-21: qty 1

## 2020-05-21 MED ORDER — METHYLPREDNISOLONE SODIUM SUCC 125 MG IJ SOLR
125.0000 mg | Freq: Once | INTRAMUSCULAR | Status: AC
  Administered 2020-05-21: 125 mg via INTRAVENOUS
  Filled 2020-05-21: qty 2

## 2020-05-21 MED ORDER — ALUM & MAG HYDROXIDE-SIMETH 200-200-20 MG/5ML PO SUSP
30.0000 mL | ORAL | Status: DC | PRN
  Administered 2020-05-21: 30 mL via ORAL
  Filled 2020-05-21: qty 30

## 2020-05-21 MED ORDER — DEXMETHYLPHENIDATE HCL 5 MG PO TABS
10.0000 mg | ORAL_TABLET | Freq: Two times a day (BID) | ORAL | Status: DC | PRN
  Administered 2020-05-21 – 2020-05-22 (×2): 10 mg via ORAL
  Filled 2020-05-21: qty 2

## 2020-05-21 MED ORDER — PNEUMOCOCCAL VAC POLYVALENT 25 MCG/0.5ML IJ INJ
0.5000 mL | INJECTION | INTRAMUSCULAR | Status: AC
  Administered 2020-05-23: 0.5 mL via INTRAMUSCULAR
  Filled 2020-05-21: qty 0.5

## 2020-05-21 MED ORDER — FUROSEMIDE 10 MG/ML IJ SOLN
20.0000 mg | Freq: Once | INTRAMUSCULAR | Status: AC
  Administered 2020-05-21: 20 mg via INTRAVENOUS
  Filled 2020-05-21: qty 2

## 2020-05-21 MED ORDER — SODIUM CHLORIDE 0.9 % IV SOLN
2.0000 g | INTRAVENOUS | Status: DC
  Administered 2020-05-21 – 2020-05-22 (×2): 2 g via INTRAVENOUS
  Filled 2020-05-21: qty 20
  Filled 2020-05-21 (×2): qty 2

## 2020-05-21 MED ORDER — IOHEXOL 350 MG/ML SOLN
63.0000 mL | Freq: Once | INTRAVENOUS | Status: AC | PRN
  Administered 2020-05-21: 63 mL via INTRAVENOUS

## 2020-05-21 MED ORDER — BUSPIRONE HCL 10 MG PO TABS
10.0000 mg | ORAL_TABLET | Freq: Three times a day (TID) | ORAL | Status: DC
  Administered 2020-05-21 – 2020-05-23 (×7): 10 mg via ORAL
  Filled 2020-05-21 (×7): qty 1

## 2020-05-21 MED ORDER — SODIUM CHLORIDE 0.9 % IV SOLN
1.0000 g | Freq: Once | INTRAVENOUS | Status: AC
  Administered 2020-05-21: 1 g via INTRAVENOUS
  Filled 2020-05-21: qty 10

## 2020-05-21 MED ORDER — GABAPENTIN 100 MG PO CAPS
100.0000 mg | ORAL_CAPSULE | Freq: Every day | ORAL | Status: DC
  Administered 2020-05-21 – 2020-05-22 (×2): 100 mg via ORAL
  Filled 2020-05-21 (×2): qty 1

## 2020-05-21 MED ORDER — ACETAMINOPHEN 325 MG PO TABS: 650.0000 mg | ORAL_TABLET | Freq: Four times a day (QID) | ORAL | Status: DC | PRN

## 2020-05-21 MED ORDER — INFLUENZA VAC SPLIT QUAD 0.5 ML IM SUSY
0.5000 mL | PREFILLED_SYRINGE | INTRAMUSCULAR | Status: AC
  Administered 2020-05-23: 0.5 mL via INTRAMUSCULAR
  Filled 2020-05-21: qty 0.5

## 2020-05-21 MED ORDER — PANTOPRAZOLE SODIUM 40 MG PO TBEC
40.0000 mg | DELAYED_RELEASE_TABLET | Freq: Two times a day (BID) | ORAL | Status: DC
  Administered 2020-05-21 – 2020-05-23 (×5): 40 mg via ORAL
  Filled 2020-05-21 (×5): qty 1

## 2020-05-21 MED ORDER — SODIUM CHLORIDE 0.9 % IV SOLN
500.0000 mg | Freq: Once | INTRAVENOUS | Status: AC
  Administered 2020-05-21: 500 mg via INTRAVENOUS
  Filled 2020-05-21: qty 500

## 2020-05-21 MED ORDER — HYDRALAZINE HCL 25 MG PO TABS
25.0000 mg | ORAL_TABLET | Freq: Three times a day (TID) | ORAL | Status: DC
  Administered 2020-05-21 – 2020-05-23 (×5): 25 mg via ORAL
  Filled 2020-05-21 (×5): qty 1

## 2020-05-21 MED ORDER — TIZANIDINE HCL 4 MG PO TABS
6.0000 mg | ORAL_TABLET | Freq: Four times a day (QID) | ORAL | Status: DC
  Administered 2020-05-21 – 2020-05-23 (×9): 6 mg via ORAL
  Filled 2020-05-21 (×12): qty 1

## 2020-05-21 MED ORDER — SODIUM CHLORIDE 0.9 % IV SOLN
500.0000 mg | INTRAVENOUS | Status: DC
  Administered 2020-05-21: 500 mg via INTRAVENOUS
  Filled 2020-05-21 (×2): qty 500

## 2020-05-21 MED ORDER — ENOXAPARIN SODIUM 40 MG/0.4ML ~~LOC~~ SOLN
40.0000 mg | SUBCUTANEOUS | Status: DC
  Administered 2020-05-21 – 2020-05-23 (×3): 40 mg via SUBCUTANEOUS
  Filled 2020-05-21 (×3): qty 0.4

## 2020-05-21 MED ORDER — OXYCODONE HCL 5 MG PO TABS
15.0000 mg | ORAL_TABLET | Freq: Four times a day (QID) | ORAL | Status: DC | PRN
  Administered 2020-05-21 – 2020-05-23 (×9): 15 mg via ORAL
  Filled 2020-05-21 (×9): qty 3

## 2020-05-21 MED ORDER — IPRATROPIUM-ALBUTEROL 0.5-2.5 (3) MG/3ML IN SOLN
3.0000 mL | Freq: Once | RESPIRATORY_TRACT | Status: AC
  Administered 2020-05-21: 3 mL via RESPIRATORY_TRACT
  Filled 2020-05-21: qty 3

## 2020-05-21 MED ORDER — SALINE SPRAY 0.65 % NA SOLN
1.0000 | NASAL | Status: DC | PRN
  Administered 2020-05-21: 1 via NASAL
  Filled 2020-05-21: qty 44

## 2020-05-21 MED ORDER — FLUOXETINE HCL 20 MG PO CAPS
40.0000 mg | ORAL_CAPSULE | Freq: Every day | ORAL | Status: DC
  Administered 2020-05-21 – 2020-05-23 (×3): 40 mg via ORAL
  Filled 2020-05-21 (×4): qty 2

## 2020-05-21 NOTE — ED Triage Notes (Signed)
Pt BIBA from home with sudden onset of SOB and chest pain along with head pain. Pt rec'd 324mg  ASA from EMS. Pt is anxious in triage. Congested cough assessed with no sputum production. Breathing is labored.

## 2020-05-21 NOTE — ED Notes (Signed)
Pt requesting more water despite already having 2 full cups of water. Educated patient on fluid restriction and strict intake and output due to fluid overload. Will provide patient with mouth swabs for dry mouth.

## 2020-05-21 NOTE — ED Notes (Signed)
Pt found standing in the room and yelling for water. Re educated patient on NPO status while waiting for test work. Pt pushed past RN narrating to get to the sink in the room and become verbally aggressive. Security paged and patient got back in the bed. Pt getting breathing treatment.

## 2020-05-21 NOTE — ED Provider Notes (Signed)
[facility] EMERGENCY DEPARTMENT Provider Note   CSN: [id number] Arrival date & time: 05/21/20  0328     History Chief Complaint  Patient presents with   Shortness of Breath    Todd [name] is a 50 y.o. male.  Patient presents to the emergency department with a chief complaint of shortness of breath.  He states symptoms started suddenly tonight.  He denies any fevers.  Denies any vomiting.  States that he woke up with the symptoms.  He denies any treatment prior to arrival.  The history is provided by the patient. No language interpreter was used.       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 05/14/2019   Closed dislocation of right hip (HCC) 04/29/2019   Hypoxia 10/07/2015   Mechanical complication of internal orthopedic device (HCC) 03/16/2015   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   Anemia 07/16/2014   Altered mental status 10/20/2013   Dislocation of hip prosthesis (HCC) 10/20/2013   CAP (community acquired pneumonia) 10/20/2013   Acute GI bleeding 09/19/2013   Acute blood loss anemia 09/19/2013   Essential hypertension 09/19/2013   Sinus tachycardia 09/19/2013   GASTROESOPHAGEAL REFLUX, NO ESOPHAGITIS 05/06/2006    Past Surgical History:  Procedure Laterality Date   APPENDECTOMY     as child   COLONOSCOPY WITH PROPOFOL  02/17/2012   Procedure:  COLONOSCOPY WITH PROPOFOL;  Surgeon: Willis Modena, MD;  Location: [facility] ENDOSCOPY;  Service: Endoscopy;  Laterality: N/A;   ESOPHAGOGASTRODUODENOSCOPY Left 09/19/2013   Procedure: ESOPHAGOGASTRODUODENOSCOPY (EGD);  Surgeon: Willis Modena, MD;  Location: [facility] ENDOSCOPY;  Service: Endoscopy;  Laterality: Left;   ESOPHAGOGASTRODUODENOSCOPY (EGD) WITH PROPOFOL  02/17/2012   Procedure: ESOPHAGOGASTRODUODENOSCOPY (EGD) WITH PROPOFOL;  Surgeon: Willis Modena, MD;  Location: [facility] ENDOSCOPY;  Service: Endoscopy;  Laterality: N/A;   HIP CLOSED REDUCTION Right 10/20/2013   Procedure: CLOSED REDUCTION HIP;  Surgeon: Eldred Manges, MD;  Location: [facility] OR;  Service: Orthopedics;  Laterality: Right;   HIP CLOSED REDUCTION Right 02/18/2015   Procedure: CLOSED REDUCTION HIP;  Surgeon: Jodi Geralds, MD;  Location: [facility] ORS;  Service: Orthopedics;  Laterality: Right;   HIP CLOSED REDUCTION Right 03/16/2015   Procedure: CLOSED REDUCTION HIP;  Surgeon: Kathryne Hitch, MD;  Location: [facility] OR;  Service: Orthopedics;  Laterality: Right;   HIP CLOSED REDUCTION Right 04/29/2019   Procedure: CLOSED MANIPULATION HIP;  Surgeon: Teryl Lucy, MD;  Location: [facility] ORS;  Service: Orthopedics;  Laterality: Right;   HIP CLOSED REDUCTION Right 05/14/2019   Procedure: CLOSED REDUCTION HIP;  Surgeon: Deeann Saint, MD;  Location: [facility] ORS;  Service: Orthopedics;  Laterality: Right;   HIP CLOSED REDUCTION Right 05/22/2019   Procedure: CLOSED MANIPULATION HIP;  Surgeon: Teryl Lucy, MD;  Location: [facility] ORS;  Service: Orthopedics;  Laterality: Right;   JOINT REPLACEMENT  2005   Right Hip       Family History  Problem Relation Age  of Onset   Diabetes Mother     Social History   Tobacco Use   Smoking status: Never Smoker   Smokeless tobacco: Never Used  Vaping Use   Vaping Use: Never used  Substance Use Topics   Alcohol use: No   Drug use: Not Currently    Types: IV    Home Medications Prior to Admission  medications   Medication Sig Start Date End Date Taking? Authorizing Provider  busPIRone (BUSPAR) 10 MG tablet Take 10 mg by mouth 3 (three) times daily. 03/20/19   [provider]  dexmethylphenidate (FOCALIN) 10 MG tablet Take 10 mg by mouth 2 (two) times daily as needed (for work). 04/07/19   [provider]  FLUoxetine (PROZAC) 40 MG capsule Take 40 mg by mouth daily.    [provider]  gabapentin (NEURONTIN) 100 MG capsule Take 100 mg by mouth at bedtime. 04/03/20   [provider]  ibuprofen (ADVIL) 200 MG tablet Take 600 mg by mouth every 6 (six) hours as needed for fever, mild pain or headache.    [provider]  metoprolol (LOPRESSOR) 50 MG tablet Take 50 mg by mouth 2 (two) times daily.     [provider]  oxyCODONE (ROXICODONE) 15 MG immediate release tablet Take 1 tablet (15 mg total) by mouth every 6 (six) hours as needed for pain. 05/07/20   Lorre Nick, MD  pantoprazole (PROTONIX) 40 MG tablet Take 40 mg by mouth 2 (two) times daily.  03/22/19   [provider]  testosterone cypionate (DEPOTESTOSTERONE CYPIONATE) 200 MG/ML injection Inject 200 mg into the muscle every 14 (fourteen) days.  11/11/18   [provider]  tiZANidine (ZANAFLEX) 4 MG tablet Take 6 mg by mouth 4 (four) times daily.    [provider]  triamcinolone ointment (KENALOG) 0.1 % Apply 1 application topically 2 (two) times daily as needed (psoriasis).    [provider]    Allergies    Ambien [zolpidem tartrate], Nu-iron [polysaccharide iron complex], and Fentanyl  Review of Systems   Review of Systems  All other systems reviewed and are negative.   Physical Exam Updated Vital Signs BP (!) 162/104 (BP Location: Right Arm)    Pulse 61    Temp 98.8 F (37.1 C) (Oral)    Resp (!) 36    SpO2 94%   Physical Exam Vitals and nursing note reviewed.  Constitutional:      Appearance: He is well-developed.  HENT:     Head:  Normocephalic and atraumatic.  Eyes:     Conjunctiva/sclera: Conjunctivae normal.  Cardiovascular:     Rate and Rhythm: Regular rhythm. Bradycardia present.     Heart sounds: No murmur heard.   Pulmonary:     Effort: Respiratory distress present.     Breath sounds: Rhonchi present.     Comments: Moderate respiratory distress Abdominal:     Palpations: Abdomen is soft.     Tenderness: There is no abdominal tenderness.  Musculoskeletal:        General: Normal range of motion.     Cervical back: Neck supple.  Skin:    General: Skin is warm and dry.  Neurological:     Mental Status: He is alert and oriented to person, place, and time.  Psychiatric:     Comments: anxious     ED Results / Procedures / Treatments   Labs (all labs ordered are listed, but only abnormal results are displayed) Labs Reviewed  CBC  WITH DIFFERENTIAL/PLATELET - Abnormal; Notable for the following components:      Result Value   WBC 12.8 (*)    RDW 15.7 (*)    Platelets 458 (*)    Neutro Abs 11.3 (*)    Lymphs Abs 0.4 (*)    All other components within normal limits  BRAIN NATRIURETIC PEPTIDE - Abnormal; Notable for the following components:   B Natriuretic Peptide 482.4 (*)    All other components within normal limits  COMPREHENSIVE METABOLIC PANEL - Abnormal; Notable for the following components:   Glucose, Bld 224 (*)    Calcium 8.5 (*)    Total Protein 6.4 (*)    Albumin 3.1 (*)    All other components within normal limits  CULTURE, BLOOD (SINGLE)  SARS CORONAVIRUS 2 (TAT 6-24 HRS)  LACTIC ACID, PLASMA  PROTIME-INR  APTT  LACTIC ACID, PLASMA  TROPONIN I (HIGH SENSITIVITY)  TROPONIN I (HIGH SENSITIVITY)    EKG EKG Interpretation  Date/Time:  Tuesday May 21 2020 03:35:03 EDT Ventricular Rate:  53 PR Interval:    QRS Duration: 76 QT Interval:  454 QTC Calculation: 427 R Axis:   79 Text Interpretation: Sinus rhythm Probable left atrial enlargement Left ventricular hypertrophy ST  elevation suggests acute pericarditis Confirmed by Ross MarcusHorton, Courtney ([id number]) on 05/21/2020 3:52:13 AM   Radiology DG Chest Port 1 View  Result Date: 05/21/2020 CLINICAL DATA:  Shortness of breath and fever EXAM: PORTABLE CHEST 1 VIEW COMPARISON:  03/11/2016 FINDINGS: Extensive pulmonary infiltrate on the right. Large hiatal hernia which is gas distended. No visible effusion or pneumothorax. Normal heart size. Remote right rib fractures. IMPRESSION: 1. Extensive pneumonia on the right. 2. Large hiatal hernia which is more gas distended than previously, consider proximal obstruction and aspiration. Electronically Signed   By: Marnee SpringJonathon  Watts M.D.   On: 05/21/2020 04:00    Procedures .Critical Care Performed by: Roxy HorsemanBrowning, Aminata Buffalo, PA-C Authorized by: Roxy HorsemanBrowning, Ford Peddie, PA-C   Critical care provider statement:    Critical care time (minutes):  50   Critical care was necessary to treat or prevent imminent or life-threatening deterioration of the following conditions:  Respiratory failure   Critical care was time spent personally by me on the following activities:  Discussions with consultants, evaluation of patient's response to treatment, examination of patient, ordering and performing treatments and interventions, ordering and review of laboratory studies, ordering and review of radiographic studies, pulse oximetry, re-evaluation of patient's condition, obtaining history from patient or surrogate and review of old charts     Medications Ordered in ED Medications  ipratropium-albuterol (DUONEB) 0.5-2.5 (3) MG/3ML nebulizer solution 3 mL (has no administration in time range)  methylPREDNISolone sodium succinate (SOLU-MEDROL) 125 mg/2 mL injection 125 mg (has no administration in time range)    ED Course  I have reviewed the triage vital signs and the nursing notes.  Pertinent labs & imaging results that were available during my care of the patient were reviewed by me and considered in my medical  decision making (see chart for details).    MDM Rules/Calculators/A&P                          This patient complains of SOB, this involves an extensive number of treatment options, and is a complaint that carries with it a high risk of complications and morbidity.    Differential Dx Covid, PE, pneumonia, CHF, COPD, asthma  Pertinent Labs I ordered, reviewed, and interpreted labs, which  included CBC shows mild leukocytosis, lactate is normal, no significant electrolyte derangement.  Imaging Interpretation I ordered imaging studies which included chest x-ray.  I independently visualized and interpreted the chest x-ray, which showed large right-sided pneumonia.   Medications I ordered medication azithromycin and Rocephin for pneumonia.  Critical Interventions  Frequent rechecks, continuous O2 monitoring, O2 supplementation with nonrebreather mask  Reassessments After the interventions stated above, I reevaluated the patient and found improved after he calm down, titrating oxygen down.  Consultants Dr. Toniann Fail, hospitalist, who is appreciated for admitting.  Recommends adding CT.  Plan Admit    Final Clinical Impression(s) / ED Diagnoses Final diagnoses:  Community acquired pneumonia of right lung, unspecified part of lung    Rx / DC Orders ED Discharge Orders    None       Roxy Horseman, PA-C 05/21/20 0541    Shon Baton, MD 05/21/20 [phone]

## 2020-05-21 NOTE — ED Notes (Signed)
Pt resting with eyes closed and in NAD. IV abx infusing. Pt placed back on NRB at 10L due to drop in O2 saturation on 6L [place]. Placed warm blanket on patient.

## 2020-05-21 NOTE — Progress Notes (Signed)
°  PROGRESS NOTE    Todd [name]  VPX:[id number] DOB: 12-Mar-1970 DOA: 05/21/2020  PCP: [facility]    LOS - 0    Patient admitted earlier this AM with acute respiratory failure secondary to pneumonia after presenting with progressive shortness of breath.  CTA chest showed right-sided pneumonia, negative for PE.  Started on Rocephin and Zithromax.  Interval subjective: Pt feeling little better, somewhat less short of breath this afternoon.  Requests something for congestion at night, and something for sleep.  States is currently homeless.  Also follows at a pain clinic and has appt he will miss due to this admission, concerned he will not have medication refilled when he leaves the hospital.  Exam: awake & alert, on room air, normal respiratory effort, diminished Right, abdomen non-distended non-tender, grossly non-focal neuro exam.   Principal Problem:   Acute respiratory failure with hypoxia (HCC) Active Problems:   Essential hypertension   CAP (community acquired pneumonia)    I have reviewed the full H&P by Dr. Eilene Ghazi in detail, and I agree with the assessment and plan as outlined therein. In addition: --Ambien PRN sleep --ocean nasal spray added for congestion --Cardiology consulted for ?pericarditis on ECG --Will hold off anti-inflammatory regimen for now --Check inflammatory markers and f/u Cardiology recs --Echo reviewed, normal EF, no valvular disease or diastolic dysfunction   No Charge    Pennie Banter, DO [facility]   If 7PM-7AM, please contact night-coverage [email or web address] 05/21/2020, 4:35 PM

## 2020-05-21 NOTE — ED Notes (Signed)
Pt repeatedly asking for water due to a dry mouth. Educated patient on NPO status while testing is done. Pt continues to ask for water despite education. Pt appears anxious. Provider aware.

## 2020-05-21 NOTE — ED Notes (Signed)
Pt resting with eyes closed and in NAD. Resp even and unlabored. VSS.

## 2020-05-21 NOTE — Progress Notes (Signed)
  Echocardiogram 2D Echocardiogram has been performed.  Todd [name] 05/21/2020, 2:48 PM

## 2020-05-21 NOTE — H&P (Signed)
History and Physical    Todd [name] GGY:[id number] DOB: 05-Feb-1971 DOA: 05/21/2020  PCP: [facility]  Patient coming from: Home.  Chief Complaint: Shortness of breath.  HPI: Todd [name] is a 50 y.o. male with history of hypertension, chronic pain, GERD, ADD presents to the ER because of worsening shortness of breath.  Patient states over the last 24 hours he had flulike symptoms which acutely worsened last midnight.  Patient became short of breath with pleuritic type of chest pain and had some cough nonproductive.  Patient had a syncopal episode when he tried to exert and he called EMS.  ED Course: In the ER patient was hypoxic requiring 10 L oxygen.  Chest x-ray shows right-sided pneumonia.  Labs also show BNP of 400 high sensitive troponins were negative Covid test is still pending.  WBC count is mildly elevated.  EKG shows sinus bradycardia with heart rate 53 beats minute with nonspecific ST-T changes.  Patient admitted for acute respiratory failure likely from pneumonia.  Review of Systems: As per HPI, rest all negative.   Past Medical History:  Diagnosis Date  . Drug addiction (HCC)   . 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 GERD (gastroesophageal reflux disease)   . H/O hiatal hernia   . Hypertension     Past Surgical History:  Procedure Laterality Date  . APPENDECTOMY     as child  . COLONOSCOPY WITH PROPOFOL  02/17/2012   Procedure: COLONOSCOPY WITH PROPOFOL;  Surgeon: Willis Modena, MD;  Location: [facility] ENDOSCOPY;  Service: Endoscopy;  Laterality: N/A;  . ESOPHAGOGASTRODUODENOSCOPY Left 09/19/2013   Procedure: ESOPHAGOGASTRODUODENOSCOPY (EGD);  Surgeon: Willis Modena, MD;  Location: [facility] ENDOSCOPY;  Service: Endoscopy;  Laterality: Left;  . ESOPHAGOGASTRODUODENOSCOPY (EGD) WITH PROPOFOL  02/17/2012   Procedure: ESOPHAGOGASTRODUODENOSCOPY (EGD) WITH PROPOFOL;  Surgeon: Willis Modena, MD;  Location: [facility]  ENDOSCOPY;  Service: Endoscopy;  Laterality: N/A;  . HIP CLOSED REDUCTION Right 10/20/2013   Procedure: CLOSED REDUCTION HIP;  Surgeon: Eldred Manges, MD;  Location: [facility] OR;  Service: Orthopedics;  Laterality: Right;  . HIP CLOSED REDUCTION Right 02/18/2015   Procedure: CLOSED REDUCTION HIP;  Surgeon: Jodi Geralds, MD;  Location: [facility] ORS;  Service: Orthopedics;  Laterality: Right;  . HIP CLOSED REDUCTION Right 03/16/2015   Procedure: CLOSED REDUCTION HIP;  Surgeon: Kathryne Hitch, MD;  Location: [facility] OR;  Service: Orthopedics;  Laterality: Right;  . HIP CLOSED REDUCTION Right 04/29/2019   Procedure: CLOSED MANIPULATION HIP;  Surgeon: Teryl Lucy, MD;  Location: [facility] ORS;  Service: Orthopedics;  Laterality: Right;  . HIP CLOSED REDUCTION Right 05/14/2019   Procedure: CLOSED REDUCTION HIP;  Surgeon: Deeann Saint, MD;  Location: [facility] ORS;  Service: Orthopedics;  Laterality: Right;  . HIP CLOSED REDUCTION Right 05/22/2019   Procedure: CLOSED MANIPULATION HIP;  Surgeon: Teryl Lucy, MD;  Location: [facility] ORS;  Service: Orthopedics;  Laterality: Right;  . JOINT REPLACEMENT  2005   Right Hip     reports that he has never smoked. He has never used smokeless tobacco. He reports previous drug use. Drug: IV. He reports that he does not drink alcohol.  Allergies  Allergen Reactions  . Ambien [Zolpidem Tartrate] Other (See Comments)    Sedation   . Nu-Iron [Polysaccharide Iron Complex] Diarrhea  . Fentanyl Other (See Comments)    Patches caused headache    Family History  Problem Relation Age of Onset  . Diabetes Mother  Prior to Admission medications   Medication Sig Start Date End Date Taking? Authorizing Provider  busPIRone (BUSPAR) 10 MG tablet Take 10 mg by mouth 3 (three) times daily. 03/20/19   [provider]  dexmethylphenidate (FOCALIN) 10 MG tablet Take 10 mg by mouth 2 (two) times daily as needed (for work). 04/07/19   [provider]  FLUoxetine (PROZAC) 40 MG  capsule Take 40 mg by mouth daily.    [provider]  gabapentin (NEURONTIN) 100 MG capsule Take 100 mg by mouth at bedtime. 04/03/20   [provider]  ibuprofen (ADVIL) 200 MG tablet Take 600 mg by mouth every 6 (six) hours as needed for fever, mild pain or headache.    [provider]  metoprolol (LOPRESSOR) 50 MG tablet Take 50 mg by mouth 2 (two) times daily.     [provider]  oxyCODONE (ROXICODONE) 15 MG immediate release tablet Take 1 tablet (15 mg total) by mouth every 6 (six) hours as needed for pain. 05/07/20   Lorre Nick, MD  pantoprazole (PROTONIX) 40 MG tablet Take 40 mg by mouth 2 (two) times daily.  03/22/19   [provider]  testosterone cypionate (DEPOTESTOSTERONE CYPIONATE) 200 MG/ML injection Inject 200 mg into the muscle every 14 (fourteen) days.  11/11/18   [provider]  tiZANidine (ZANAFLEX) 4 MG tablet Take 6 mg by mouth 4 (four) times daily.    [provider]  triamcinolone ointment (KENALOG) 0.1 % Apply 1 application topically 2 (two) times daily as needed (psoriasis).    [provider]    Physical Exam: Constitutional: Moderately built and nourished. Vitals:   05/21/20 0340 05/21/20 0342 05/21/20 0445 05/21/20 0515  BP: (!) 162/104  (!) 166/110 (!) 147/108  Pulse: 61  (!) 55 66  Resp: (!) 36  (!) 24 (!) 27  Temp: 98.8 F (37.1 C)     TempSrc: Oral     SpO2: (!) 84% 94% 93% 90%   Eyes: Anicteric no pallor. ENMT: No discharge from the ears eyes nose or mouth. Neck: No mass felt.  No neck rigidity. Respiratory: No rhonchi or crepitations. Cardiovascular: S1-S2 heard. Abdomen: Soft nontender bowel sounds present. Musculoskeletal: No edema. Skin: No rash. Neurologic: Alert awake oriented to time place and person.  Moves all extremities. Psychiatric: Appears normal.  Normal affect.   Labs on Admission: I have personally reviewed following labs and imaging studies  CBC: Recent Labs   Lab 05/21/20 0346  WBC 12.8*  NEUTROABS 11.3*  HGB 13.2  HCT 41.5  MCV 90.0  PLT 458*   Basic Metabolic Panel: Recent Labs  Lab 05/21/20 0409  NA 135  K 4.0  CL 101  CO2 28  GLUCOSE 224*  BUN 10  CREATININE 1.20  CALCIUM 8.5*   GFR: CrCl cannot be calculated (Unknown ideal weight.). Liver Function Tests: Recent Labs  Lab 05/21/20 0409  AST 23  ALT 22  ALKPHOS 94  BILITOT 0.5  PROT 6.4*  ALBUMIN 3.1*   No results for input(s): LIPASE, AMYLASE in the last 168 hours. No results for input(s): AMMONIA in the last 168 hours. Coagulation Profile: Recent Labs  Lab 05/21/20 0409  INR 1.1   Cardiac Enzymes: No results for input(s): CKTOTAL, CKMB, CKMBINDEX, TROPONINI in the last 168 hours. BNP (last 3 results) No results for input(s): PROBNP in the last 8760 hours. HbA1C: No results for input(s): HGBA1C in the last 72 hours. CBG: No results for input(s): GLUCAP in the last  168 hours. Lipid Profile: No results for input(s): CHOL, HDL, LDLCALC, TRIG, CHOLHDL, LDLDIRECT in the last 72 hours. Thyroid Function Tests: No results for input(s): TSH, T4TOTAL, FREET4, T3FREE, THYROIDAB in the last 72 hours. Anemia Panel: No results for input(s): VITAMINB12, FOLATE, FERRITIN, TIBC, IRON, RETICCTPCT in the last 72 hours. Urine analysis:    Component Value Date/Time   COLORURINE YELLOW 06/25/2014 1621   APPEARANCEUR CLEAR 06/25/2014 1621   LABSPEC 1.014 06/25/2014 1621   PHURINE 8.0 06/25/2014 1621   GLUCOSEU NEGATIVE 06/25/2014 1621   HGBUR NEGATIVE 06/25/2014 1621   BILIRUBINUR NEGATIVE 06/25/2014 1621   KETONESUR NEGATIVE 06/25/2014 1621   PROTEINUR NEGATIVE 06/25/2014 1621   UROBILINOGEN 0.2 06/25/2014 1621   NITRITE NEGATIVE 06/25/2014 1621   LEUKOCYTESUR NEGATIVE 06/25/2014 1621   Sepsis Labs: @LABRCNTIP (procalcitonin:4,lacticidven:4) )No results found for this or any previous visit (from the past 240 hour(s)).   Radiological Exams on Admission: CT Angio  Chest PE W and/or Wo Contrast  Result Date: 05/21/2020 CLINICAL DATA:  High probability for pulmonary embolism. Sudden onset pain EXAM: CT ANGIOGRAPHY CHEST WITH CONTRAST TECHNIQUE: Multidetector CT imaging of the chest was performed using the standard protocol during bolus administration of intravenous contrast. Multiplanar CT image reconstructions and MIPs were obtained to evaluate the vascular anatomy. CONTRAST:  63mL OMNIPAQUE IOHEXOL 350 MG/ML SOLN COMPARISON:  10/20/13 FINDINGS: Cardiovascular: Satisfactory opacification of the pulmonary arteries to the segmental level. No evidence of pulmonary embolism. Normal heart size. No pericardial effusion. Mediastinum/Nodes: Large hiatal hernia which is moderately gas distended. Transverse colon also herniates through the esophageal hiatus. Adjacent transverse colonic loops are gas distended both before and after the herniated segment. Lungs/Pleura: Airspace disease throughout the right lung. The central airways are clear. Trace pleural fluid on the right. Negative for air leak Upper Abdomen: No acute finding Musculoskeletal: No acute finding Review of the MIP images confirms the above findings. IMPRESSION: 1. Negative for pulmonary embolism. 2. Asymmetric right-sided airspace disease compatible with pneumonia. Given a large sliding hiatal hernia, aspiration in the decubitus position is also considered. 3. Transverse colon herniation through the esophageal hiatus. Electronically Signed   By: Marnee SpringJonathon  Watts M.D.   On: 05/21/2020 06:29   DG Chest Port 1 View  Result Date: 05/21/2020 CLINICAL DATA:  Shortness of breath and fever EXAM: PORTABLE CHEST 1 VIEW COMPARISON:  03/11/2016 FINDINGS: Extensive pulmonary infiltrate on the right. Large hiatal hernia which is gas distended. No visible effusion or pneumothorax. Normal heart size. Remote right rib fractures. IMPRESSION: 1. Extensive pneumonia on the right. 2. Large hiatal hernia which is more gas distended than  previously, consider proximal obstruction and aspiration. Electronically Signed   By: Marnee SpringJonathon  Watts M.D.   On: 05/21/2020 04:00    EKG: Independently reviewed.  Sinus bradycardia heart rate around 53 bpm.  Nonspecific T changes.  Assessment/Plan Principal Problem:   Acute respiratory failure with hypoxia (HCC) Active Problems:   Essential hypertension   CAP (community acquired pneumonia)    1. Acute respiratory failure with hypoxia presently on 10 L oxygen likely from pneumonia for which patient has been placed on empiric antibiotics.  Given the mildly elevated BNP around 400 suspect there may be a component of CHF I ordered small dose of Lasix 20 mg IV.  Check sputum cultures.  CT angiogram of the chest is pending.  Covid test is pending. 2. Hypertension presently on metoprolol.  Mildly bradycardic will place patient on holding parameters for metoprolol.  Nonspecific T changes.  Follow 2D echo.  3. Brief episode of syncope could be likely from hypoxia.  Patient is also mildly bradycardic which could have contributed.  Follow 2D echo and monitor in telemetry. 4. History of chronic pain on oxycodone gabapentin and ties Todd [name]. 5. History of ADD takes as needed Focalin.  Since patient is acute respiratory failure with hypoxia will need close monitoring for any further worsening in inpatient status.  Covid test is pending.   DVT prophylaxis: Lovenox. Code Status: Full code. Family Communication: Discussed with patient. Disposition Plan: Home. Consults called: None. Admission status: Inpatient.   Eduard Clos MD [facility] Pager [phone].  If 7PM-7AM, please contact night-coverage [email or web address] Password [email or web address]  05/21/2020, 6:38 AM

## 2020-05-22 DIAGNOSIS — J9601 Acute respiratory failure with hypoxia: Secondary | ICD-10-CM | POA: Diagnosis not present

## 2020-05-22 LAB — PROCALCITONIN: Procalcitonin: 0.46 ng/mL

## 2020-05-22 LAB — BRAIN NATRIURETIC PEPTIDE: B Natriuretic Peptide: 272.6 pg/mL — ABNORMAL HIGH (ref 0.0–100.0)

## 2020-05-22 MED ORDER — IPRATROPIUM-ALBUTEROL 0.5-2.5 (3) MG/3ML IN SOLN
3.0000 mL | Freq: Four times a day (QID) | RESPIRATORY_TRACT | Status: DC
  Administered 2020-05-22 – 2020-05-23 (×5): 3 mL via RESPIRATORY_TRACT
  Filled 2020-05-22 (×5): qty 3

## 2020-05-22 MED ORDER — IPRATROPIUM-ALBUTEROL 0.5-2.5 (3) MG/3ML IN SOLN: 3.0000 mL | RESPIRATORY_TRACT | Status: DC | PRN

## 2020-05-22 MED ORDER — SENNOSIDES-DOCUSATE SODIUM 8.6-50 MG PO TABS
1.0000 | ORAL_TABLET | Freq: Two times a day (BID) | ORAL | Status: DC | PRN
  Administered 2020-05-22: 1 via ORAL
  Filled 2020-05-22: qty 1

## 2020-05-22 MED ORDER — AZITHROMYCIN 250 MG PO TABS
500.0000 mg | ORAL_TABLET | Freq: Every day | ORAL | Status: DC
  Administered 2020-05-22: 500 mg via ORAL

## 2020-05-22 MED ORDER — HALOPERIDOL LACTATE 5 MG/ML IJ SOLN
5.0000 mg | Freq: Once | INTRAMUSCULAR | Status: AC
  Administered 2020-05-22: 5 mg via INTRAVENOUS
  Filled 2020-05-22: qty 1

## 2020-05-22 MED ORDER — FUROSEMIDE 10 MG/ML IJ SOLN
40.0000 mg | Freq: Once | INTRAMUSCULAR | Status: AC
  Administered 2020-05-22: 40 mg via INTRAVENOUS
  Filled 2020-05-22: qty 4

## 2020-05-22 NOTE — Progress Notes (Signed)
PHARMACIST - PHYSICIAN COMMUNICATION DR:   Nelson Chimes CONCERNING: Antibiotic IV to Oral Route Change Policy  RECOMMENDATION: This patient is receiving azithromycin by the intravenous route.  Based on criteria approved by the Pharmacy and Therapeutics Committee, the antibiotic(s) is/are being converted to the equivalent oral dose form(s).   DESCRIPTION: These criteria include:  Patient being treated for a respiratory tract infection, urinary tract infection, cellulitis or clostridium difficile associated diarrhea if on metronidazole  The patient is not neutropenic and does not exhibit a GI malabsorption state  The patient is eating (either orally or via tube) and/or has been taking other orally administered medications for a least 24 hours  The patient is improving clinically and has a Tmax < 100.5  If you have questions about this conversion, please contact the Pharmacy Department  []   [phone] )  ( [phone] []   [phone] )  [facility] [x]   [phone] )  [facility] []   [phone] )  [facility] []   [phone] )  [facility]

## 2020-05-22 NOTE — Progress Notes (Signed)
PROGRESS NOTE    Todd [name]  KGU:[id number] DOB: 05-11-1970 DOA: 05/21/2020 PCP: [facility]   Brief Narrative:  50 y.o. male with history of hypertension, chronic pain, GERD, ADD presents to the ER because of worsening shortness of breath.  Patient states over the last 24 hours he had flulike symptoms which acutely worsened last midnight.  Patient became short of breath with pleuritic type of chest pain and had some cough nonproductive.  Patient diagnosed with combination of multifocal pneumonia and concern for pericarditis with fluid overload.  Started on IV diuretics and antibiotics.  Cardiology team consulted.   Assessment & Plan:   Principal Problem:   Acute respiratory failure with hypoxia (HCC) Active Problems:   Essential hypertension   CAP (community acquired pneumonia)   Acute hypoxic respiratory failure requiring 10 L nasal cannula Community-acquired pneumonia -Patient does not use any oxygen.  Upon admission required 10 L nasal cannula.  This is multifactorial with combination of community-acquired pneumonia and fluid overload. -CTA chest is negative for pulmonary embolism. -Continue bronchodilators.  Supportive care. -Empiric Rocephin and azithromycin, can stop if procalcitonin is negative -Check BNP, procalcitonin  Atypical chest pain -Elevated inflammatory marker.  Echocardiogram and EKG overall unremarkable but concerns of acute pericarditis.  Cardiology team consulted  Syncope -Unclear etiology.  CTA chest is negative for PE, echocardiogram is overall unremarkable.  Continue to monitor on telemetry. -Low TSH, mildly elevated T4.  Will need to repeat thyroid studies in 4 to 6 weeks outpatient  Chronic pain -Continue home medications  ADD -Continue home meds  Essential hypertension -Norvasc 5 mg orally daily   DVT prophylaxis: enoxaparin (LOVENOX) injection 40 mg Start: 05/21/20 1000  Code Status: Full code Family Communication:     Status is: Inpatient  Remains inpatient appropriate because:Inpatient level of care appropriate due to severity of illness   Dispo: The patient is from: Home              Anticipated d/c is to: Home              Patient currently is not medically stable to d/c.   Difficult to place patient No   Hopefully we can discharge patient in next 24 hours after evaluation by cardiology team and improvement in his breathing symptoms.    Body mass index is 22.12 kg/m.      Subjective: During my evaluation patient is reporting of some chest discomfort and shortness of breath along with coughing.  States his shortness of breath worsens especially with some ambulation.  Review of Systems Otherwise negative except as per HPI, including: General: Denies fever, chills, night sweats or unintended weight loss. Resp: Denies cough, wheezing, shortness of breath. Cardiac: Denies chest pain, palpitations, orthopnea, paroxysmal nocturnal dyspnea. GI: Denies abdominal pain, nausea, vomiting, diarrhea or constipation GU: Denies dysuria, frequency, hesitancy or incontinence MS: Denies muscle aches, joint pain or swelling Neuro: Denies headache, neurologic deficits (focal weakness, numbness, tingling), abnormal gait Psych: Denies anxiety, depression, SI/HI/AVH Skin: Denies new rashes or lesions ID: Denies sick contacts, exotic exposures, travel  Examination:  General exam: Appears calm and comfortable  Respiratory system: Mild bilateral rhonchi Cardiovascular system: S1 & S2 heard, RRR. No JVD, murmurs, rubs, gallops or clicks. No pedal edema. Gastrointestinal system: Abdomen is nondistended, soft and nontender. No organomegaly or masses felt. Normal bowel sounds heard. Central nervous system: Alert and oriented. No focal neurological deficits. Extremities: Symmetric 5 x 5 power. Skin: No rashes, lesions or ulcers Psychiatry: Judgement  and insight appear normal. Mood & affect appropriate.      Objective: Vitals:   05/21/20 2008 05/21/20 2319 05/22/20 0844 05/22/20 1211  BP: (!) 176/94 (!) 146/91 (!) 167/99 125/80  Pulse: (!) 53 64 60 63  Resp: 18 (!) 29 20 20   Temp: 98.3 F (36.8 C) 98.3 F (36.8 C)    TempSrc: Oral Oral    SpO2: 95% 96% 92% 93%  Weight:      Height:        Intake/Output Summary (Last 24 hours) at 05/22/2020 1259 Last data filed at 05/22/2020 1056 Gross per 24 hour  Intake 1054 ml  Output 1300 ml  Net -246 ml   Filed Weights   05/21/20 0948  Weight: 78.2 kg     Data Reviewed:   CBC: Recent Labs  Lab 05/21/20 0346  WBC 12.8*  NEUTROABS 11.3*  HGB 13.2  HCT 41.5  MCV 90.0  PLT 458*   Basic Metabolic Panel: Recent Labs  Lab 05/21/20 0409 05/21/20 0419  NA 135  --   K 4.0  --   CL 101  --   CO2 28  --   GLUCOSE 224*  --   BUN 10  --   CREATININE 1.20  --   CALCIUM 8.5*  --   MG  --  1.9   GFR: Estimated Creatinine Clearance: 82.4 mL/min (by C-G formula based on SCr of 1.2 mg/dL). Liver Function Tests: Recent Labs  Lab 05/21/20 0409  AST 23  ALT 22  ALKPHOS 94  BILITOT 0.5  PROT 6.4*  ALBUMIN 3.1*   No results for input(s): LIPASE, AMYLASE in the last 168 hours. No results for input(s): AMMONIA in the last 168 hours. Coagulation Profile: Recent Labs  Lab 05/21/20 0409  INR 1.1   Cardiac Enzymes: No results for input(s): CKTOTAL, CKMB, CKMBINDEX, TROPONINI in the last 168 hours. BNP (last 3 results) No results for input(s): PROBNP in the last 8760 hours. HbA1C: No results for input(s): HGBA1C in the last 72 hours. CBG: No results for input(s): GLUCAP in the last 168 hours. Lipid Profile: No results for input(s): CHOL, HDL, LDLCALC, TRIG, CHOLHDL, LDLDIRECT in the last 72 hours. Thyroid Function Tests: Recent Labs    05/21/20 1001 05/21/20 1010  TSH  --  0.104*  FREET4 1.22*  --    Anemia Panel: No results for input(s): VITAMINB12, FOLATE, FERRITIN, TIBC, IRON, RETICCTPCT in the last 72  hours. Sepsis Labs: Recent Labs  Lab 05/21/20 0425 05/21/20 0535  LATICACIDVEN 1.6 1.6    Recent Results (from the past 240 hour(s))  Blood culture (routine single)     Status: None (Preliminary result)   Collection Time: 05/21/20  4:25 AM   Specimen: BLOOD RIGHT HAND  Result Value Ref Range Status   Specimen Description BLOOD RIGHT HAND  Final   Special Requests   Final    BOTTLES DRAWN AEROBIC AND ANAEROBIC Blood Culture adequate volume   Culture   Final    NO GROWTH 1 DAY Performed at [facility], [address] [place], [place]    Report Status PENDING  Incomplete  SARS CORONAVIRUS 2 (TAT 6-24 HRS) Nasopharyngeal Nasopharyngeal Swab     Status: None   Collection Time: 05/21/20  5:41 AM   Specimen: Nasopharyngeal Swab  Result Value Ref Range Status   SARS Coronavirus 2 NEGATIVE NEGATIVE Final    Comment: (NOTE) SARS-CoV-2 target nucleic acids are NOT DETECTED.  The SARS-CoV-2 RNA is  generally detectable in upper and lower respiratory specimens during the acute phase of infection. Negative results do not preclude SARS-CoV-2 infection, do not rule out co-infections with other pathogens, and should not be used as the sole basis for treatment or other patient management decisions. Negative results must be combined with clinical observations, patient history, and epidemiological information. The expected result is Negative.  Fact Sheet for Patients: HairSlick.nohttps://www.fda.gov/media/138098/download  Fact Sheet for Healthcare Providers: quierodirigir.comhttps://www.fda.gov/media/138095/download  This test is not yet approved or cleared by the [place] FDA and  has been authorized for detection and/or diagnosis of SARS-CoV-2 by FDA under an Emergency Use Authorization (EUA). This EUA will remain  in effect (meaning this test can be used) for the duration of the COVID-19 declaration under Se ction 564(b)(1) of the Act, 21 U.S.C. section 360bbb-3(b)(1), unless the  authorization is terminated or revoked sooner.  Performed at [facility], [address] [place], [place] [address]          Radiology Studies: CT Angio Chest PE W and/or Wo Contrast  Result Date: 05/21/2020 CLINICAL DATA:  High probability for pulmonary embolism. Sudden onset pain EXAM: CT ANGIOGRAPHY CHEST WITH CONTRAST TECHNIQUE: Multidetector CT imaging of the chest was performed using the standard protocol during bolus administration of intravenous contrast. Multiplanar CT image reconstructions and MIPs were obtained to evaluate the vascular anatomy. CONTRAST:  63mL OMNIPAQUE IOHEXOL 350 MG/ML SOLN COMPARISON:  10/20/13 FINDINGS: Cardiovascular: Satisfactory opacification of the pulmonary arteries to the segmental level. No evidence of pulmonary embolism. Normal heart size. No pericardial effusion. Mediastinum/Nodes: Large hiatal hernia which is moderately gas distended. Transverse colon also herniates through the esophageal hiatus. Adjacent transverse colonic loops are gas distended both before and after the herniated segment. Lungs/Pleura: Airspace disease throughout the right lung. The central airways are clear. Trace pleural fluid on the right. Negative for air leak Upper Abdomen: No acute finding Musculoskeletal: No acute finding Review of the MIP images confirms the above findings. IMPRESSION: 1. Negative for pulmonary embolism. 2. Asymmetric right-sided airspace disease compatible with pneumonia. Given a large sliding hiatal hernia, aspiration in the decubitus position is also considered. 3. Transverse colon herniation through the esophageal hiatus. Electronically Signed   By: Marnee SpringJonathon  Watts M.D.   On: 05/21/2020 06:29   DG Chest Port 1 View  Result Date: 05/21/2020 CLINICAL DATA:  Shortness of breath and fever EXAM: PORTABLE CHEST 1 VIEW COMPARISON:  03/11/2016 FINDINGS: Extensive pulmonary infiltrate on the right. Large hiatal hernia which is gas distended. No visible  effusion or pneumothorax. Normal heart size. Remote right rib fractures. IMPRESSION: 1. Extensive pneumonia on the right. 2. Large hiatal hernia which is more gas distended than previously, consider proximal obstruction and aspiration. Electronically Signed   By: Marnee SpringJonathon  Watts M.D.   On: 05/21/2020 04:00   ECHOCARDIOGRAM COMPLETE  Result Date: 05/21/2020    ECHOCARDIOGRAM REPORT   Patient Name:   Todd [name] Date of Exam: 05/21/2020 Medical Rec #:  [id number]     Height:       74.0 in Accession #:    [id number]    Weight:       172.3 lb Date of Birth:  12/02/1970     BSA:          2.040 m Patient Age:    49 years      BP:           171/117 mmHg Patient Gender: M  HR:           45 bpm. Exam Location:  Inpatient Procedure: 2D Echo, Cardiac Doppler and Color Doppler Indications:    Dyspnea R06.00  History:        Patient has no prior history of Echocardiogram examinations.                 Risk Factors:Hypertension. GERD. Drug addiction.  Sonographer:    Elmarie Shiley Dance Referring Phys: [id number] ARSHAD N KAKRAKANDY IMPRESSIONS  1. Left ventricular ejection fraction, by estimation, is 60 to 65%. The left ventricle has normal function. The left ventricle has no regional wall motion abnormalities. Left ventricular diastolic parameters were normal.  2. Right ventricular systolic function is normal. The right ventricular size is normal. There is normal pulmonary artery systolic pressure.  3. The mitral valve is normal in structure. Mild to moderate mitral valve regurgitation. No evidence of mitral stenosis.  4. The aortic valve is normal in structure. Aortic valve regurgitation is not visualized. No aortic stenosis is present.  5. The inferior vena cava is normal in size with greater than 50% respiratory variability, suggesting right atrial pressure of 3 mmHg. FINDINGS  Left Ventricle: Diastolic function evaluation is limited by marked bradycardia. Annulus tissue Doppler velocities are consistent with normal  diastolic function. Left ventricular ejection fraction, by estimation, is 60 to 65%. The left ventricle has normal function. The left ventricle has no regional wall motion abnormalities. The left ventricular internal cavity size was normal in size. There is no left ventricular hypertrophy. Left ventricular diastolic parameters were normal. Right Ventricle: The right ventricular size is normal. No increase in right ventricular wall thickness. Right ventricular systolic function is normal. There is normal pulmonary artery systolic pressure. The tricuspid regurgitant velocity is 2.51 m/s, and  with an assumed right atrial pressure of 3 mmHg, the estimated right ventricular systolic pressure is 28.2 mmHg. Left Atrium: Left atrial size was normal in size. Right Atrium: Right atrial size was normal in size. Pericardium: There is no evidence of pericardial effusion. Mitral Valve: The mitral valve is normal in structure. Mild to moderate mitral valve regurgitation. No evidence of mitral valve stenosis. Tricuspid Valve: The tricuspid valve is normal in structure. Tricuspid valve regurgitation is trivial. No evidence of tricuspid stenosis. Aortic Valve: The aortic valve is normal in structure. Aortic valve regurgitation is not visualized. No aortic stenosis is present. Pulmonic Valve: The pulmonic valve was normal in structure. Pulmonic valve regurgitation is not visualized. No evidence of pulmonic stenosis. Aorta: The aortic root is normal in size and structure. Venous: The inferior vena cava is normal in size with greater than 50% respiratory variability, suggesting right atrial pressure of 3 mmHg. IAS/Shunts: No atrial level shunt detected by color flow Doppler.  LEFT VENTRICLE PLAX 2D LVIDd:         4.50 cm  Diastology LVIDs:         3.80 cm  LV e' medial:    10.80 cm/s LV PW:         1.20 cm  LV E/e' medial:  12.0 LV IVS:        0.80 cm  LV e' lateral:   9.51 cm/s LVOT diam:     1.80 cm  LV E/e' lateral: 13.7 LV SV:          58 LV SV Index:   28 LVOT Area:     2.54 cm  RIGHT VENTRICLE  IVC RV Basal diam:  3.30 cm     IVC diam: 1.40 cm RV Mid diam:    2.50 cm RV S prime:     13.60 cm/s TAPSE (M-mode): 2.4 cm LEFT ATRIUM             Index       RIGHT ATRIUM           Index LA diam:        3.30 cm 1.62 cm/m  RA Area:     20.60 cm LA Vol (A2C):   96.5 ml 47.30 ml/m RA Volume:   60.40 ml  29.61 ml/m LA Vol (A4C):   62.2 ml 30.49 ml/m LA Biplane Vol: 78.9 ml 38.68 ml/m  AORTIC VALVE LVOT Vmax:   97.40 cm/s LVOT Vmean:  65.900 cm/s LVOT VTI:    0.227 m  AORTA Ao Root diam: 3.30 cm Ao Asc diam:  3.10 cm MITRAL VALVE                TRICUSPID VALVE MV Area (PHT): 3.12 cm     TR Peak grad:   25.2 mmHg MV Decel Time: 243 msec     TR Vmax:        251.00 cm/s MV E velocity: 130.00 cm/s MV A velocity: 54.40 cm/s   SHUNTS MV E/A ratio:  2.39         Systemic VTI:  0.23 m                             Systemic Diam: 1.80 cm Rachelle Hora Croitoru MD Electronically signed by Thurmon Fair MD Signature Date/Time: 05/21/2020/3:36:40 PM    Final         Scheduled Meds: . amLODipine  5 mg Oral Daily  . azithromycin  500 mg Oral q1800  . busPIRone  10 mg Oral TID  . enoxaparin (LOVENOX) injection  40 mg Subcutaneous Q24H  . FLUoxetine  40 mg Oral Daily  . gabapentin  100 mg Oral QHS  . hydrALAZINE  25 mg Oral Q8H  . influenza vac split quadrivalent PF  0.5 mL Intramuscular Tomorrow-1000  . metoprolol tartrate  50 mg Oral BID  . pantoprazole  40 mg Oral BID  . pneumococcal 23 valent vaccine  0.5 mL Intramuscular Tomorrow-1000  . tiZANidine  6 mg Oral QID   Continuous Infusions: . cefTRIAXone (ROCEPHIN)  IV 2 g (05/21/20 2116)     LOS: 1 day   Time spent= 35 mins    Ankit Joline Maxcy, MD [facility]  If 7PM-7AM, please contact night-coverage  05/22/2020, 12:59 PM

## 2020-05-22 NOTE — TOC Initial Note (Addendum)
Transition of Care [facility]) - Initial/Assessment Note    Patient Details  Name: Todd [name] MRN: [id number] Date of Birth: 01-14-1971  [facility] CM/SW Contact:    Joanne Chars, LCSW Phone Number: 05/22/2020, 4:11 PM  Clinical Narrative: CSW met with pt regarding TOC consult for housing issues.  Pt shares story: owns single wide mobile home but was evicted in a dispute over lot rent.  Still engaged in effort to recover this trailer.  Pt has been staying with two adult sons but they recently told him he could not return.  Christian: [phone] and Felix Ahmadi [phone].  Ex wife Karin Lieu is also supportive but not a housing option: [phone]. Permission given to speak with all three.  Pt asked CSW to call all three to advocate for him to return with sons.  Pt also said his exwife "is the person who could talk my sons into letting me come back." Pt does have disability income of $1700 but next pay date is early April.   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  Pt sees Dr Cristela Blue at [facility] PCP.  Pt is not vaccinated for covid. Pt does not have his own transportation.    CSW provided contact info for [place] and asked pt to contact them both regarding his legal situation with his trailer and low rent housing options since he has income.   CSW also discussed shelter options in [place] through [facility] and also the [employer], where pt could get immediate shelter if that is his goal.  Pt does not want to leave the area and go to [facility] and is not excited about shelter options either.                    Expected Discharge Plan: Homeless Shelter Barriers to Discharge: Continued Medical Work up   Patient Goals and CMS Choice Patient states their goals for this hospitalization and ongoing recovery are:: "get back to normal" CMS Medicare.gov Compare Post Acute Care list provided to::  (na)    Expected Discharge  Plan and Services Expected Discharge Plan: Homeless Shelter In-house Referral: Clinical Social Work   Post Acute Care Choice: NA Living arrangements for the past 2 months: No permanent address                                      Prior Living Arrangements/Services Living arrangements for the past 2 months: No permanent address Lives with:: Other (Comment) (Has been place to [place] recently with sons) Patient language and need for interpreter reviewed:: Yes Do you feel safe going back to the place where you live?: No   No permanent housing currently  Need for Family Participation in Patient Care: No (Comment) Care giver support system in place?: Yes (comment) Current home services: Other (comment) (none) Criminal Activity/Legal Involvement Pertinent to Current Situation/Hospitalization: No - Comment as needed  Activities of Daily Living Home Assistive Devices/Equipment: None ADL Screening (condition at time of admission) Patient's cognitive ability adequate to safely complete daily activities?: Yes Is the patient deaf or have difficulty hearing?: No Does the patient have difficulty seeing, even when wearing glasses/contacts?: No Does the patient have difficulty concentrating, remembering, or making decisions?: No Patient able to express need for assistance with ADLs?: Yes Does the patient have difficulty dressing or bathing?: No Independently performs ADLs?: Yes (  appropriate for developmental age) Does the patient have difficulty walking or climbing stairs?: No Weakness of Legs: None Weakness of Arms/Hands: None  Permission Sought/Granted Permission sought to share information with : Family Supports Permission granted to share information with : Yes, Verbal Permission Granted  Share Information with NAME: 2 sons, ex wife           Emotional Assessment Appearance:: Appears stated age Attitude/Demeanor/Rapport: Engaged Affect (typically observed):  Appropriate,Pleasant Orientation: : Oriented to Self,Oriented to Place,Oriented to  Time,Oriented to Situation Alcohol / Substance Use: Not Applicable Psych Involvement: No (comment)  Admission diagnosis:  CAP (community acquired pneumonia) [J18.9] Acute respiratory failure with hypoxia ([place]) [J96.01] Community acquired pneumonia of right lung, unspecified part of lung [J18.9] Patient Active Problem List   Diagnosis Date Noted  . Acute respiratory failure with hypoxia ([place]) 05/21/2020  . Self-injurious behavior 05/07/2020  . Non-suicidal self-harm 05/07/2020  . Acute stress reaction causing mixed disturbance of emotion and conduct 05/07/2020  . Opioid dependence with opioid-induced mood disorder ([place])   . 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 Closed dislocation of right hip ([place]) 04/29/2019  . Hypoxia 10/07/2015  . Mechanical complication of internal orthopedic device ([place]) 03/16/2015  . 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 Anemia 07/16/2014  . Altered mental status 10/20/2013  . Dislocation of hip prosthesis ([place]) 10/20/2013  . CAP (community acquired pneumonia) 10/20/2013  . Acute GI bleeding 09/19/2013  . Acute blood loss anemia 09/19/2013  . Essential hypertension 09/19/2013  . Sinus tachycardia 09/19/2013  . GASTROESOPHAGEAL REFLUX, NO ESOPHAGITIS 05/06/2006   PCP:  [place]:   [id number] [place] [id number]: [phone]: [phone]    Social Determinants of Health (SDOH) Interventions    Readmission Risk Interventions No flowsheet data found.

## 2020-05-22 NOTE — Consult Note (Signed)
CARDIOLOGY CONSULT NOTE  Patient ID: Todd [name] MRN: [id number] DOB/AGE: 08-11-1970 50 y.o.  Admit date: 05/21/2020 Attending physician: Damita Lack, MD Primary Physician:  [facility] Cardiologist: None Inpatient Cardiologist: Rex Kras, DO, [facility]  Chief complaint: Shortness of breath  HPI:  Todd [name] is a 50 y.o. Caucasian male who presents with a chief complaint of " shortness of breath." His past medical history and cardiovascular risk factors include: Hypertension, chronic pain, GERD, ADD.  Cardiology is consulted during this hospitalization for possible diastolic heart failure and questionable pericarditis.  Patient states that he started having cold-like symptoms on Sunday 05/19/2020 he started using over-the-counter NyQuil which helped his symptoms.  However on Tuesday morning he woke up with difficulty in breathing, shallow breaths and therefore called EMS for further evaluation.  Patient is currently being treated for pneumonia.   He had a CT PE protocol which is negative for pulmonary embolism.  Patient denies any chest pain or pleuritic discomfort.  Denies orthopnea, paroxysmal nocturnal dyspnea or lower extremity swelling. No prior history of coronary artery disease or congestive heart failure.  Denies sick contacts.  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  ALLERGIES: Allergies  Allergen Reactions  . Ambien [Zolpidem Tartrate] Other (See Comments)    Sedation   . Nu-Iron [Polysaccharide Iron Complex] Diarrhea  . Fentanyl Other (See Comments)    Patches caused headache    PAST MEDICAL HISTORY: Past Medical History:  Diagnosis Date  . Drug addiction ([place])   . 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 GERD (gastroesophageal reflux disease)   . H/O hiatal hernia   . Hypertension     PAST SURGICAL HISTORY: Past Surgical History:  Procedure Laterality Date  .  APPENDECTOMY     as child  . COLONOSCOPY WITH PROPOFOL  02/17/2012   Procedure: COLONOSCOPY WITH PROPOFOL;  Surgeon: Arta Silence, MD;  Location: [facility];  Service: Endoscopy;  Laterality: N/A;  . ESOPHAGOGASTRODUODENOSCOPY Left 09/19/2013   Procedure: ESOPHAGOGASTRODUODENOSCOPY (EGD);  Surgeon: Arta Silence, MD;  Location: [facility] ENDOSCOPY;  Service: Endoscopy;  Laterality: Left;  . ESOPHAGOGASTRODUODENOSCOPY (EGD) WITH PROPOFOL  02/17/2012   Procedure: ESOPHAGOGASTRODUODENOSCOPY (EGD) WITH PROPOFOL;  Surgeon: Arta Silence, MD;  Location: [facility];  Service: Endoscopy;  Laterality: N/A;  . HIP CLOSED REDUCTION Right 10/20/2013   Procedure: CLOSED REDUCTION HIP;  Surgeon: Marybelle Killings, MD;  Location: [facility];  Service: Orthopedics;  Laterality: Right;  . HIP CLOSED REDUCTION Right 02/18/2015   Procedure: CLOSED REDUCTION HIP;  Surgeon: Dorna Leitz, MD;  Location: [facility];  Service: Orthopedics;  Laterality: Right;  . HIP CLOSED REDUCTION Right 03/16/2015   Procedure: CLOSED REDUCTION HIP;  Surgeon: Mcarthur Rossetti, MD;  Location: [place];  Service: Orthopedics;  Laterality: Right;  . HIP CLOSED REDUCTION Right 04/29/2019   Procedure: CLOSED MANIPULATION HIP;  Surgeon: Marchia Bond, MD;  Location: [facility];  Service: Orthopedics;  Laterality: Right;  . HIP CLOSED REDUCTION Right 05/14/2019   Procedure: CLOSED REDUCTION HIP;  Surgeon: Earnestine Leys, MD;  Location: [facility] ORS;  Service: Orthopedics;  Laterality: Right;  . HIP CLOSED REDUCTION Right 05/22/2019   Procedure: CLOSED MANIPULATION HIP;  Surgeon: Marchia Bond, MD;  Location: [facility];  Service: Orthopedics;  Laterality: Right;  . JOINT REPLACEMENT  2005   Right Hip    FAMILY HISTORY: The patient family history includes Diabetes in his mother.   SOCIAL HISTORY:  The patient  reports that he has never smoked. He has never used smokeless tobacco. He reports previous drug use. Drug: IV. He reports that he does not drink  alcohol.  MEDICATIONS: Current Outpatient Medications  Medication Instructions  . busPIRone (BUSPAR) 10 mg, Oral, 3 times daily  . dexmethylphenidate (FOCALIN) 10 mg, Oral, 2 times daily PRN  . FLUoxetine (PROZAC) 40 mg, Oral, Daily  . gabapentin (NEURONTIN) 100 mg, Oral, Daily at bedtime  . ibuprofen (ADVIL) 600 mg, Oral, Every 6 hours PRN  . metoprolol tartrate (LOPRESSOR) 50 mg, Oral, 2 times daily  . oxyCODONE (ROXICODONE) 15 mg, Oral, Every 6 hours PRN  . pantoprazole (PROTONIX) 40 mg, Oral, 2 times daily  . testosterone cypionate (DEPOTESTOSTERONE CYPIONATE) 200 mg, Intramuscular, Every 14 days  . tiZANidine (ZANAFLEX) 6 mg, Oral, 4 times daily  . triamcinolone ointment (KENALOG) 0.1 % 1 application, Topical, 2 times daily PRN    14 ORGAN REVIEW OF SYSTEMS: Review of Systems  Constitutional: Negative for chills and fever.  HENT: Negative for hoarse voice and nosebleeds.   Eyes: Negative for discharge, double vision and pain.  Cardiovascular: Negative for chest pain, claudication, dyspnea on exertion, leg swelling, near-syncope, orthopnea, palpitations, paroxysmal nocturnal dyspnea and syncope.  Respiratory: Negative for hemoptysis and shortness of breath.   Musculoskeletal: Positive for back pain and joint pain (hip). Negative for muscle cramps and myalgias.  Gastrointestinal: Negative for abdominal pain, constipation, diarrhea, hematemesis, hematochezia, melena, nausea and vomiting.  Neurological: Negative for dizziness and light-headedness.  All other systems reviewed and are negative.  PHYSICAL EXAM: Vitals with BMI 05/22/2020 05/22/2020 05/21/2020  Height - - -  Weight - - -  BMI - - -  Systolic 628 366 294  Diastolic 80 99 91  Pulse 63 60 64     Intake/Output Summary (Last 24 hours) at 05/22/2020 1322 Last data filed at 05/22/2020 1056 Gross per 24 hour  Intake 590 ml  Output 1300 ml  Net -710 ml    Net IO Since Admission: -1,040 mL [05/22/20  1322]  CONSTITUTIONAL: Well-developed and well-nourished. No acute distress.  SKIN: Skin is warm and dry. No rash noted. No cyanosis. No pallor. No jaundice HEAD: Normocephalic and atraumatic.  EYES: No scleral icterus MOUTH/THROAT: Moist oral membranes.  NECK: No JVD present. No thyromegaly noted. No carotid bruits  LYMPHATIC: No visible cervical adenopathy.  CHEST Normal respiratory effort. No intercostal retractions.  Tattoos noted anteriorly and posteriorly. LUNGS: Clear to auscultation anteriorly with rhonchi noted posterior right lower lobe. CARDIOVASCULAR: Regular, positive T6-L4, soft holosystolic murmur heard at the apex, no rubs or gallops appreciated. ABDOMINAL: Soft, nontender, nondistended, positive bowel sounds all 4 quadrants, no apparent ascites.  EXTREMITIES: No peripheral edema, 2+ dorsalis pedis and posterior tibial pulses bilaterally, warm to touch HEMATOLOGIC: No significant bruising NEUROLOGIC: Oriented to person, place, and time. Nonfocal. Normal muscle tone.  PSYCHIATRIC: Normal mood and affect. Normal behavior. Cooperative  RADIOLOGY: CT Angio Chest PE W and/or Wo Contrast  Result Date: 05/21/2020 CLINICAL DATA:  High probability for pulmonary embolism. Sudden onset pain EXAM: CT ANGIOGRAPHY CHEST WITH CONTRAST TECHNIQUE: Multidetector CT imaging of the chest was performed using the standard protocol during bolus administration of intravenous contrast. Multiplanar CT image reconstructions and MIPs were obtained to evaluate the vascular anatomy. CONTRAST:  10m OMNIPAQUE IOHEXOL 350 MG/ML SOLN COMPARISON:  10/20/13 FINDINGS: Cardiovascular: Satisfactory opacification of the pulmonary arteries to the segmental level. No evidence of pulmonary embolism. Normal heart size. No pericardial effusion. Mediastinum/Nodes:  Large hiatal hernia which is moderately gas distended. Transverse colon also herniates through the esophageal hiatus. Adjacent transverse colonic loops are gas  distended both before and after the herniated segment. Lungs/Pleura: Airspace disease throughout the right lung. The central airways are clear. Trace pleural fluid on the right. Negative for air leak Upper Abdomen: No acute finding Musculoskeletal: No acute finding Review of the MIP images confirms the above findings. IMPRESSION: 1. Negative for pulmonary embolism. 2. Asymmetric right-sided airspace disease compatible with pneumonia. Given a large sliding hiatal hernia, aspiration in the decubitus position is also considered. 3. Transverse colon herniation through the esophageal hiatus. Electronically Signed   By: Monte Fantasia M.D.   On: 05/21/2020 06:29   DG Chest Port 1 View  Result Date: 05/21/2020 CLINICAL DATA:  Shortness of breath and fever EXAM: PORTABLE CHEST 1 VIEW COMPARISON:  03/11/2016 FINDINGS: Extensive pulmonary infiltrate on the right. Large hiatal hernia which is gas distended. No visible effusion or pneumothorax. Normal heart size. Remote right rib fractures. IMPRESSION: 1. Extensive pneumonia on the right. 2. Large hiatal hernia which is more gas distended than previously, consider proximal obstruction and aspiration. Electronically Signed   By: Monte Fantasia M.D.   On: 05/21/2020 04:00   ECHOCARDIOGRAM COMPLETE  Result Date: 05/21/2020    ECHOCARDIOGRAM REPORT   Patient Name:   Todd [name] Date of Exam: 05/21/2020 Medical Rec #:  [id number]     Height:       74.0 in Accession #:    [id number]    Weight:       172.3 lb Date of Birth:  1970/03/31     BSA:          2.040 m Patient Age:    89 years      BP:           171/117 mmHg Patient Gender: M             HR:           45 bpm. Exam Location:  Inpatient Procedure: 2D Echo, Cardiac Doppler and Color Doppler Indications:    Dyspnea R06.00  History:        Patient has no prior history of Echocardiogram examinations.                 Risk Factors:Hypertension. GERD. Drug addiction.  Sonographer:    Jonelle Sidle Dance Referring Phys: [place]  1. Left ventricular ejection fraction, by estimation, is 60 to 65%. The left ventricle has normal function. The left ventricle has no regional wall motion abnormalities. Left ventricular diastolic parameters were normal.  2. Right ventricular systolic function is normal. The right ventricular size is normal. There is normal pulmonary artery systolic pressure.  3. The mitral valve is normal in structure. Mild to moderate mitral valve regurgitation. No evidence of mitral stenosis.  4. The aortic valve is normal in structure. Aortic valve regurgitation is not visualized. No aortic stenosis is present.  5. The inferior vena cava is normal in size with greater than 50% respiratory variability, suggesting right atrial pressure of 3 mmHg. FINDINGS  Left Ventricle: Diastolic function evaluation is limited by marked bradycardia. Annulus tissue Doppler velocities are consistent with normal diastolic function. Left ventricular ejection fraction, by estimation, is 60 to 65%. The left ventricle has normal function. The left ventricle has no regional wall motion abnormalities. The left ventricular internal cavity size was normal in size. There is no left ventricular hypertrophy. Left ventricular  diastolic parameters were normal. Right Ventricle: The right ventricular size is normal. No increase in right ventricular wall thickness. Right ventricular systolic function is normal. There is normal pulmonary artery systolic pressure. The tricuspid regurgitant velocity is 2.51 m/s, and  with an assumed right atrial pressure of 3 mmHg, the estimated right ventricular systolic pressure is 98.1 mmHg. Left Atrium: Left atrial size was normal in size. Right Atrium: Right atrial size was normal in size. Pericardium: There is no evidence of pericardial effusion. Mitral Valve: The mitral valve is normal in structure. Mild to moderate mitral valve regurgitation. No evidence of mitral valve stenosis. Tricuspid Valve: The  tricuspid valve is normal in structure. Tricuspid valve regurgitation is trivial. No evidence of tricuspid stenosis. Aortic Valve: The aortic valve is normal in structure. Aortic valve regurgitation is not visualized. No aortic stenosis is present. Pulmonic Valve: The pulmonic valve was normal in structure. Pulmonic valve regurgitation is not visualized. No evidence of pulmonic stenosis. Aorta: The aortic root is normal in size and structure. Venous: The inferior vena cava is normal in size with greater than 50% respiratory variability, suggesting right atrial pressure of 3 mmHg. IAS/Shunts: No atrial level shunt detected by color flow Doppler.  LEFT VENTRICLE PLAX 2D LVIDd:         4.50 cm  Diastology LVIDs:         3.80 cm  LV e' medial:    10.80 cm/s LV PW:         1.20 cm  LV E/e' medial:  12.0 LV IVS:        0.80 cm  LV e' lateral:   9.51 cm/s LVOT diam:     1.80 cm  LV E/e' lateral: 13.7 LV SV:         58 LV SV Index:   28 LVOT Area:     2.54 cm  RIGHT VENTRICLE             IVC RV Basal diam:  3.30 cm     IVC diam: 1.40 cm RV Mid diam:    2.50 cm RV S prime:     13.60 cm/s TAPSE (M-mode): 2.4 cm LEFT ATRIUM             Index       RIGHT ATRIUM           Index LA diam:        3.30 cm 1.62 cm/m  RA Area:     20.60 cm LA Vol (A2C):   96.5 ml 47.30 ml/m RA Volume:   60.40 ml  29.61 ml/m LA Vol (A4C):   62.2 ml 30.49 ml/m LA Biplane Vol: 78.9 ml 38.68 ml/m  AORTIC VALVE LVOT Vmax:   97.40 cm/s LVOT Vmean:  65.900 cm/s LVOT VTI:    0.227 m  AORTA Ao Root diam: 3.30 cm Ao Asc diam:  3.10 cm MITRAL VALVE                TRICUSPID VALVE MV Area (PHT): 3.12 cm     TR Peak grad:   25.2 mmHg MV Decel Time: 243 msec     TR Vmax:        251.00 cm/s MV E velocity: 130.00 cm/s MV A velocity: 54.40 cm/s   SHUNTS MV E/A ratio:  2.39         Systemic VTI:  0.23 m  Systemic Diam: 1.80 cm Dani Gobble Croitoru MD Electronically signed by Sanda Klein MD Signature Date/Time: 05/21/2020/3:36:40 PM     Final     LABORATORY DATA: Lab Results  Component Value Date   WBC 12.8 (H) 05/21/2020   HGB 13.2 05/21/2020   HCT 41.5 05/21/2020   MCV 90.0 05/21/2020   PLT 458 (H) 05/21/2020    Recent Labs  Lab 05/21/20 0409  NA 135  K 4.0  CL 101  CO2 28  BUN 10  CREATININE 1.20  CALCIUM 8.5*  PROT 6.4*  BILITOT 0.5  ALKPHOS 94  ALT 22  AST 23  GLUCOSE 224*    Lipid Panel  No results found for: CHOL, TRIG, HDL, CHOLHDL, VLDL, LDLCALC  BNP (last 3 results) Recent Labs    05/21/20 0348  BNP 482.4*    HEMOGLOBIN A1C No results found for: HGBA1C, MPG  Cardiac Panel (last 3 results) Recent Labs    05/21/20 0346 05/21/20 0533 05/21/20 1010  TROPONINIHS 17 12 10       Component Value Date/Time   CRP 7.4 (H) 05/21/2020 1737   Lab Results  Component Value Date   ESRSEDRATE 28 (H) 05/21/2020   TSH Recent Labs    05/21/20 1010  TSH 0.104*    CARDIAC DATABASE: EKG: 05/23/2019: Normal sinus rhythm, 79 bpm, LVH per voltage criteria, right axis deviation.  05/21/2020: Normal sinus rhythm, 52 bpm, LVH per voltage criteria, without underlying injury pattern, concave upsloping of the ST segments involving the inferior and precordial leads (V2-V6).  Echocardiogram: 05/21/2020: 1. Left ventricular ejection fraction, by estimation, is 60 to 65%. The left ventricle has normal function. The left ventricle has no regional wall motion abnormalities. Left ventricular diastolic parameters were normal.  2. Right ventricular systolic function is normal. The right ventricular size is normal. There is normal pulmonary artery systolic pressure.  3. The mitral valve is normal in structure. Mild to moderate mitral valve regurgitation. No evidence of mitral stenosis.  4. The aortic valve is normal in structure. Aortic valve regurgitation is  not visualized. No aortic stenosis is present.  5. The inferior vena cava is normal in size with greater than 50% respiratory variability,  suggesting right atrial pressure of 3 mmHg. There is no evidence of pericardial effusion.  IMPRESSION & RECOMMENDATIONS: GEDALYA JIM is a 50 y.o. Caucasian male whose past medical history and cardiovascular risk factors include: Hypertension, chronic pain, GERD, ADD.  Shortness of breath: Multifactorial community-acquired pneumonia, uncontrolled hypertension. Community-acquired pneumonia. Benign essential hypertension Elevated BNP History of illicit use  Patient shortness of breath most likely secondary to community-acquired pneumonia and passive pulmonary vascular congestion due to uncontrolled hypertension.  Patient is currently on antibiotics.  Clinically, patient is not in congestive heart failure.  Patient denies orthopnea, paroxysmal nocturnal dyspnea and lower extremity swelling.  On physical examination no JVP, no gallops, no lower extremity swelling.  Echocardiogram notes preserved LVEF and normal diastolic function.  Low suspicion for acute pericarditis as the patient denies any active chest pain or pleuritic discomfort.  No pericardial rub on physical examination.  EKG this morning shows upsloping ST segments without convincing evidence of PR elevation in aVR and PR depression in the remaining limb leads.  No pericardial effusion noted on 2D echo or CT.  High sensitive troponins are negative x3.  No fever. The elevation in ESR and CRP are not specific or sensitive for acute pericarditis.  And can be elevated in multiple disease processes.  I suspect the degree  of his shortness of breath may be coming from uncontrolled hypertension.  His systolic blood pressures are consistently greater than 140 mmHg and in the setting of community-acquired pneumonia may cause passive congestion worsening shortness of breath and elevated BNP.  Patient would benefit from better BP control and low-dose diuretic therapy.  Patient states that his blood pressures are consistently greater than 140 mmHg.  He  is educated on the importance of a low-salt diet and medication compliance.  He is asked to follow-up with his PCP once discharged for further medication titration if clinically warranted.  Case discussed with attending physician.  Continue care regarding his other chronic comorbid conditions.  We will see the patient on as needed basis please do not hesitate to reach out if any questions or concerns arise.  Discharge instructions are updated for outpatient follow-up.   Total encounter time 85 minutes. *Total Encounter Time as defined by the Centers for Medicare and Medicaid Services includes, in addition to the face-to-face time of a patient visit (documented in the note above) non-face-to-face time: obtaining and reviewing outside history, ordering and reviewing medications, tests or procedures, care coordination (communications with other health care professionals or caregivers) and documentation in the medical record.  Patient's questions and concerns were addressed to his satisfaction. He voices understanding of the instructions provided during this encounter.   This note was created using a voice recognition software as a result there may be grammatical errors inadvertently enclosed that do not reflect the nature of this encounter. Every attempt is made to correct such errors.  Mechele Claude [facility]  Pager: [phone] Office: [phone] 05/22/2020, 1:22 PM

## 2020-05-22 NOTE — Progress Notes (Signed)
Pt has been verbally abusive bc he states his pain meds are scheduled and I am refusing to give him pain meds. Advised pt , pain meds are PRN but if he needed it I could get some. Pt continues to be verbally abusive. Advised pt to tone his voice or I would have to call security .

## 2020-05-23 ENCOUNTER — Other Ambulatory Visit: Payer: Self-pay | Admitting: Internal Medicine

## 2020-05-23 DIAGNOSIS — J9601 Acute respiratory failure with hypoxia: Secondary | ICD-10-CM | POA: Diagnosis not present

## 2020-05-23 LAB — CBC
HCT: 36 % — ABNORMAL LOW (ref 39.0–52.0)
Hemoglobin: 12.2 g/dL — ABNORMAL LOW (ref 13.0–17.0)
MCH: 28.9 pg (ref 26.0–34.0)
MCHC: 33.9 g/dL (ref 30.0–36.0)
MCV: 85.3 fL (ref 80.0–100.0)
Platelets: 368 10*3/uL (ref 150–400)
RBC: 4.22 MIL/uL (ref 4.22–5.81)
RDW: 15.8 % — ABNORMAL HIGH (ref 11.5–15.5)
WBC: 5.9 10*3/uL (ref 4.0–10.5)
nRBC: 0 % (ref 0.0–0.2)

## 2020-05-23 LAB — BASIC METABOLIC PANEL
Anion gap: 9 (ref 5–15)
BUN: 12 mg/dL (ref 6–20)
CO2: 28 mmol/L (ref 22–32)
Calcium: 8.9 mg/dL (ref 8.9–10.3)
Chloride: 97 mmol/L — ABNORMAL LOW (ref 98–111)
Creatinine, Ser: 1.1 mg/dL (ref 0.61–1.24)
GFR, Estimated: >60 mL/min (ref >60)
Glucose, Bld: 125 mg/dL — ABNORMAL HIGH (ref 70–99)
Potassium: 4.5 mmol/L (ref 3.5–5.1)
Sodium: 134 mmol/L — ABNORMAL LOW (ref 135–145)

## 2020-05-23 LAB — MAGNESIUM: Magnesium: 1.7 mg/dL (ref 1.7–2.4)

## 2020-05-23 MED ORDER — TIZANIDINE HCL 4 MG PO TABS: 6.0000 mg | ORAL_TABLET | Freq: Four times a day (QID) | ORAL | 0 refills | Status: AC

## 2020-05-23 MED ORDER — HYDRALAZINE HCL 25 MG PO TABS: 25.0000 mg | ORAL_TABLET | Freq: Three times a day (TID) | ORAL | 0 refills | Status: DC

## 2020-05-23 MED ORDER — DOXYCYCLINE MONOHYDRATE 100 MG PO TABS: 100.0000 mg | ORAL_TABLET | Freq: Two times a day (BID) | ORAL | 0 refills | Status: AC

## 2020-05-23 MED ORDER — ALBUTEROL SULFATE HFA 108 (90 BASE) MCG/ACT IN AERS: 2.0000 | INHALATION_SPRAY | Freq: Four times a day (QID) | RESPIRATORY_TRACT | 0 refills | Status: AC | PRN

## 2020-05-23 MED ORDER — OXYCODONE HCL 15 MG PO TABS: 15.0000 mg | ORAL_TABLET | Freq: Four times a day (QID) | ORAL | 0 refills | Status: AC | PRN

## 2020-05-23 MED ORDER — SENNOSIDES-DOCUSATE SODIUM 8.6-50 MG PO TABS: 1.0000 | ORAL_TABLET | Freq: Two times a day (BID) | ORAL | 0 refills | Status: AC | PRN

## 2020-05-23 MED ORDER — ALBUTEROL SULFATE HFA 108 (90 BASE) MCG/ACT IN AERS
2.0000 | INHALATION_SPRAY | Freq: Four times a day (QID) | RESPIRATORY_TRACT | 0 refills | Status: DC | PRN
Start: 2020-05-23 — End: 2020-05-23

## 2020-05-23 MED ORDER — AMLODIPINE BESYLATE 5 MG PO TABS: 5.0000 mg | ORAL_TABLET | Freq: Every day | ORAL | 0 refills | Status: AC

## 2020-05-23 NOTE — TOC Transition Note (Signed)
Transition of Care [facility]) - CM/SW Discharge Note   Patient Details  Name: Todd [name] MRN: [id number] Date of Birth: 09-08-1970  [facility] of [facility] CM/SW Contact:  Lorri Frederick, LCSW Phone Number: 05/23/2020, 1:09 PM   Clinical Narrative:   Pt discharging to [facility].  Pt reports he has a room reserved and his mother is sending money to the [facility] across the street.  Medication provided by [facility] pharmacy, copays from petty cash.  Pt informed that cannot provide albuterol inhaler and he will get prescription for this.  Pt signed [place] transportation dept waiver and ride arranged through that service.  No other needs identified.     Final next level of care: Home/Self Care Barriers to Discharge: Barriers Resolved   Patient Goals and CMS Choice Patient states their goals for this hospitalization and ongoing recovery are:: "get back to normal" CMS Medicare.gov Compare Post Acute Care list provided to::  (na)    Discharge Placement                       Discharge Plan and Services In-house Referral: Clinical Social Work   Post Acute Care Choice: NA          DME Arranged: N/A DME Agency: NA       HH Arranged: NA HH Agency: NA        Social Determinants of Health (SDOH) Interventions     Readmission Risk Interventions No flowsheet data found.

## 2020-05-23 NOTE — Discharge Summary (Signed)
Physician Discharge Summary  BRITIAN JENTZ GNF:[id number] DOB: [birth year] DOA: 05/21/2020  PCP: [facility]  Admit date: 05/21/2020 Discharge date: 05/23/2020  Admitted From: Home Disposition: Home  Recommendations for Outpatient Follow-up:  1. Follow up with PCP in 1-2 weeks 2. Please obtain BMP/CBC in one week your next doctors visit.  3. 5-day supply for muscle relaxer and oxycodone has been given.  Otherwise will need to follow-up with outpatient pain management for further medications 4. Discontinue metoprolol.  Norvasc and hydralazine added 5. Follow-up outpatient cardiology in 3-4 weeks 6. Advised to remain compliant with his medications and diet.  Advised to refrain from using illicit drugs, alcohol and tobacco 7. Oral doxycycline for 5 days 8. Albuterol inhaler as needed prescribed 9. Bowel regimen prescribed   Discharge Condition: Stable CODE STATUS: Full code Diet recommendation: Heart healthy  Brief/Interim Summary: 50 y.o.malewithhistory of hypertension, chronic pain, GERD, ADD presents to the ER because of worsening shortness of breath. Patient states over the last 24 hours he had flulike symptoms which acutely worsened last midnight. Patient became short of breath with pleuritic type of chest pain and had some cough nonproductive.  Patient diagnosed with combination of multifocal pneumonia and concern for pericarditis with fluid overload.  Started on IV diuretics and antibiotics.  Cardiology team consulted.  Over the course of 48 hours patient did well in the hospital his breathing symptoms significantly improved.  Cardiology recommended discontinuing metoprolol and started Norvasc and hydralazine.  Tolerated this well.  His antibiotics were transitioned to doxycycline.  Patient is stable for discharge.     Acute hypoxic respiratory failure requiring 10 L nasal cannula Community-acquired pneumonia -No longer requiring oxygen, this is  resolved. -CTA chest is negative for pulmonary embolism. -Continue bronchodilators.  Supportive care. -Transition antibiotics to oral doxycycline for 5 more days Albuterol inhaler prescribed  Atypical chest pain, resolved -Elevated inflammatory marker.  Echocardiogram and EKG overall unremarkable but concerns of acute pericarditis.  Cardiology team consulted-no concerns for pericarditis  Syncope, resolved -Unclear etiology.  CTA chest is negative for PE, echocardiogram is overall unremarkable.  Continue to monitor on telemetry. -Low TSH, mildly elevated T4.  Will need to repeat thyroid studies in 4 to 6 weeks outpatient  Chronic pain -Continue home medications.  Prescription for muscle relaxer and oxycodone given for 5 days.  Thereafter will need to follow-up outpatient pain management  ADD -Continue home meds  Essential hypertension -Norvasc 5 mg orally daily, hydralazine 25 mg twice daily.  Discontinue metoprolol due to concerns of illicit drug use.   Body mass index is 22.11 kg/m.         Discharge Diagnoses:  Principal Problem:   Acute respiratory failure with hypoxia (HCC) Active Problems:   Essential hypertension   CAP (community acquired pneumonia)     Subjective: Feels okay no shortness of breath.  Feels back to his baseline  Discharge Exam: Vitals:   05/23/20 0752 05/23/20 0800  BP:  125/78  Pulse: 67 (!) 59  Resp: (!) 24 18  Temp:  98.2 F (36.8 C)  SpO2: 95% 99%   Vitals:   05/23/20 0016 05/23/20 0450 05/23/20 0752 05/23/20 0800  BP: 121/72 111/62  125/78  Pulse: 60 68 67 (!) 59  Resp: 20 20 (!) 24 18  Temp: 97.8 F (36.6 C) 98.3 F (36.8 C)  98.2 F (36.8 C)  TempSrc: Oral Oral  Oral  SpO2: 93% 95% 95% 99%  Weight:  78.1 kg    Height:  General: Pt is alert, awake, not in acute distress Cardiovascular: RRR, S1/S2 +, no rubs, no gallops Respiratory: CTA bilaterally, no wheezing, no rhonchi Abdominal: Soft, NT, ND, bowel  sounds + Extremities: no edema, no cyanosis  Discharge Instructions   Allergies as of 05/23/2020      Reactions   Ambien [zolpidem Tartrate] Other (See Comments)   Sedation    Nu-iron [polysaccharide Iron Complex] Diarrhea   Fentanyl Other (See Comments)   Patches caused headache      Medication List    STOP taking these medications   metoprolol tartrate 50 MG tablet Commonly known as: LOPRESSOR     TAKE these medications   albuterol 108 (90 Base) MCG/ACT inhaler Commonly known as: VENTOLIN HFA Inhale 2 puffs into the lungs every 6 (six) hours as needed for wheezing or shortness of breath.   amLODipine 5 MG tablet Commonly known as: NORVASC Take 1 tablet (5 mg total) by mouth daily.   busPIRone 10 MG tablet Commonly known as: BUSPAR Take 10 mg by mouth 3 (three) times daily.   dexmethylphenidate 10 MG tablet Commonly known as: FOCALIN Take 10 mg by mouth 2 (two) times daily as needed (for work).   doxycycline 100 MG tablet Commonly known as: ADOXA Take 1 tablet (100 mg total) by mouth 2 (two) times daily for 5 days.   FLUoxetine 40 MG capsule Commonly known as: PROZAC Take 40 mg by mouth daily.   gabapentin 100 MG capsule Commonly known as: NEURONTIN Take 100 mg by mouth at bedtime.   hydrALAZINE 25 MG tablet Commonly known as: APRESOLINE Take 1 tablet (25 mg total) by mouth every 8 (eight) hours.   ibuprofen 200 MG tablet Commonly known as: ADVIL Take 600 mg by mouth every 6 (six) hours as needed for fever, mild pain or headache.   oxyCODONE 15 MG immediate release tablet Commonly known as: ROXICODONE Take 1 tablet (15 mg total) by mouth every 6 (six) hours as needed for up to 5 days. What changed: reasons to take this   pantoprazole 40 MG tablet Commonly known as: PROTONIX Take 40 mg by mouth 2 (two) times daily.   senna-docusate 8.6-50 MG tablet Commonly known as: Senokot-S Take 1 tablet by mouth 2 (two) times daily as needed for moderate  constipation.   testosterone cypionate 200 MG/ML injection Commonly known as: DEPOTESTOSTERONE CYPIONATE Inject 200 mg into the muscle every 14 (fourteen) days.   tiZANidine 4 MG tablet Commonly known as: ZANAFLEX Take 1.5 tablets (6 mg total) by mouth 4 (four) times daily for 5 days.   triamcinolone ointment 0.1 % Commonly known as: KENALOG Apply 1 application topically 2 (two) times daily as needed (psoriasis).       Follow-up Information    Tolia, Sunit, DO Follow up in 1 month(s).   Specialties: Cardiology, Radiology, Vascular Surgery Contact information: [address] [place] [address] [phone]        [facility] Schedule an appointment as soon as possible for a visit in 1 week(s).   Contact information: [address] [place] [address] [phone]              Allergies  Allergen Reactions  . Ambien [Zolpidem Tartrate] Other (See Comments)    Sedation   . Nu-Iron [Polysaccharide Iron Complex] Diarrhea  . Fentanyl Other (See Comments)    Patches caused headache    You were cared for by a hospitalist during your  hospital stay. If you have any questions about your discharge medications or the care you received while you were in the hospital after you are discharged, you can call the unit and asked to speak with the hospitalist on call if the hospitalist that took care of you is not available. Once you are discharged, your primary care physician will handle any further medical issues. Please note that no refills for any discharge medications will be authorized once you are discharged, as it is imperative that you return to your primary care physician (or establish a relationship with a primary care physician if you do not have one) for your aftercare needs so that they can reassess your need for medications and monitor your lab values.   Procedures/Studies: CT Angio Chest PE W and/or Wo  Contrast  Result Date: 05/21/2020 CLINICAL DATA:  High probability for pulmonary embolism. Sudden onset pain EXAM: CT ANGIOGRAPHY CHEST WITH CONTRAST TECHNIQUE: Multidetector CT imaging of the chest was performed using the standard protocol during bolus administration of intravenous contrast. Multiplanar CT image reconstructions and MIPs were obtained to evaluate the vascular anatomy. CONTRAST:  63mL OMNIPAQUE IOHEXOL 350 MG/ML SOLN COMPARISON:  10/20/13 FINDINGS: Cardiovascular: Satisfactory opacification of the pulmonary arteries to the segmental level. No evidence of pulmonary embolism. Normal heart size. No pericardial effusion. Mediastinum/Nodes: Large hiatal hernia which is moderately gas distended. Transverse colon also herniates through the esophageal hiatus. Adjacent transverse colonic loops are gas distended both before and after the herniated segment. Lungs/Pleura: Airspace disease throughout the right lung. The central airways are clear. Trace pleural fluid on the right. Negative for air leak Upper Abdomen: No acute finding Musculoskeletal: No acute finding Review of the MIP images confirms the above findings. IMPRESSION: 1. Negative for pulmonary embolism. 2. Asymmetric right-sided airspace disease compatible with pneumonia. Given a large sliding hiatal hernia, aspiration in the decubitus position is also considered. 3. Transverse colon herniation through the esophageal hiatus. Electronically Signed   By: Marnee Spring M.D.   On: 05/21/2020 06:29   DG Chest Port 1 View  Result Date: 05/21/2020 CLINICAL DATA:  Shortness of breath and fever EXAM: PORTABLE CHEST 1 VIEW COMPARISON:  03/11/2016 FINDINGS: Extensive pulmonary infiltrate on the right. Large hiatal hernia which is gas distended. No visible effusion or pneumothorax. Normal heart size. Remote right rib fractures. IMPRESSION: 1. Extensive pneumonia on the right. 2. Large hiatal hernia which is more gas distended than previously, consider  proximal obstruction and aspiration. Electronically Signed   By: Marnee Spring M.D.   On: 05/21/2020 04:00   ECHOCARDIOGRAM COMPLETE  Result Date: 05/21/2020    ECHOCARDIOGRAM REPORT   Patient Name:   Todd [name] Date of Exam: 05/21/2020 Medical Rec #:  [id number]     Height:       74.0 in Accession #:    [id number]    Weight:       172.3 lb Date of Birth:  25-Nov-1970     BSA:          2.040 m Patient Age:    49 years      BP:           171/117 mmHg Patient Gender: M             HR:           45 bpm. Exam Location:  Inpatient Procedure: 2D Echo, Cardiac Doppler and Color Doppler Indications:    Dyspnea R06.00  History:  Patient has no prior history of Echocardiogram examinations.                 Risk Factors:Hypertension. GERD. Drug addiction.  Sonographer:    Elmarie Shiley Dance Referring Phys: [id number] ARSHAD N KAKRAKANDY IMPRESSIONS  1. Left ventricular ejection fraction, by estimation, is 60 to 65%. The left ventricle has normal function. The left ventricle has no regional wall motion abnormalities. Left ventricular diastolic parameters were normal.  2. Right ventricular systolic function is normal. The right ventricular size is normal. There is normal pulmonary artery systolic pressure.  3. The mitral valve is normal in structure. Mild to moderate mitral valve regurgitation. No evidence of mitral stenosis.  4. The aortic valve is normal in structure. Aortic valve regurgitation is not visualized. No aortic stenosis is present.  5. The inferior vena cava is normal in size with greater than 50% respiratory variability, suggesting right atrial pressure of 3 mmHg. FINDINGS  Left Ventricle: Diastolic function evaluation is limited by marked bradycardia. Annulus tissue Doppler velocities are consistent with normal diastolic function. Left ventricular ejection fraction, by estimation, is 60 to 65%. The left ventricle has normal function. The left ventricle has no regional wall motion abnormalities. The left  ventricular internal cavity size was normal in size. There is no left ventricular hypertrophy. Left ventricular diastolic parameters were normal. Right Ventricle: The right ventricular size is normal. No increase in right ventricular wall thickness. Right ventricular systolic function is normal. There is normal pulmonary artery systolic pressure. The tricuspid regurgitant velocity is 2.51 m/s, and  with an assumed right atrial pressure of 3 mmHg, the estimated right ventricular systolic pressure is 28.2 mmHg. Left Atrium: Left atrial size was normal in size. Right Atrium: Right atrial size was normal in size. Pericardium: There is no evidence of pericardial effusion. Mitral Valve: The mitral valve is normal in structure. Mild to moderate mitral valve regurgitation. No evidence of mitral valve stenosis. Tricuspid Valve: The tricuspid valve is normal in structure. Tricuspid valve regurgitation is trivial. No evidence of tricuspid stenosis. Aortic Valve: The aortic valve is normal in structure. Aortic valve regurgitation is not visualized. No aortic stenosis is present. Pulmonic Valve: The pulmonic valve was normal in structure. Pulmonic valve regurgitation is not visualized. No evidence of pulmonic stenosis. Aorta: The aortic root is normal in size and structure. Venous: The inferior vena cava is normal in size with greater than 50% respiratory variability, suggesting right atrial pressure of 3 mmHg. IAS/Shunts: No atrial level shunt detected by color flow Doppler.  LEFT VENTRICLE PLAX 2D LVIDd:         4.50 cm  Diastology LVIDs:         3.80 cm  LV e' medial:    10.80 cm/s LV PW:         1.20 cm  LV E/e' medial:  12.0 LV IVS:        0.80 cm  LV e' lateral:   9.51 cm/s LVOT diam:     1.80 cm  LV E/e' lateral: 13.7 LV SV:         58 LV SV Index:   28 LVOT Area:     2.54 cm  RIGHT VENTRICLE             IVC RV Basal diam:  3.30 cm     IVC diam: 1.40 cm RV Mid diam:    2.50 cm RV S prime:     13.60 cm/s TAPSE (M-mode):  2.4 cm LEFT ATRIUM  Index       RIGHT ATRIUM           Index LA diam:        3.30 cm 1.62 cm/m  RA Area:     20.60 cm LA Vol (A2C):   96.5 ml 47.30 ml/m RA Volume:   60.40 ml  29.61 ml/m LA Vol (A4C):   62.2 ml 30.49 ml/m LA Biplane Vol: 78.9 ml 38.68 ml/m  AORTIC VALVE LVOT Vmax:   97.40 cm/s LVOT Vmean:  65.900 cm/s LVOT VTI:    0.227 m  AORTA Ao Root diam: 3.30 cm Ao Asc diam:  3.10 cm MITRAL VALVE                TRICUSPID VALVE MV Area (PHT): 3.12 cm     TR Peak grad:   25.2 mmHg MV Decel Time: 243 msec     TR Vmax:        251.00 cm/s MV E velocity: 130.00 cm/s MV A velocity: 54.40 cm/s   SHUNTS MV E/A ratio:  2.39         Systemic VTI:  0.23 m                             Systemic Diam: 1.80 cm Rachelle Hora Croitoru MD Electronically signed by Thurmon Fair MD Signature Date/Time: 05/21/2020/3:36:40 PM    Final       The results of significant diagnostics from this hospitalization (including imaging, microbiology, ancillary and laboratory) are listed below for reference.     Microbiology: Recent Results (from the past 240 hour(s))  Blood culture (routine single)     Status: None (Preliminary result)   Collection Time: 05/21/20  4:25 AM   Specimen: BLOOD RIGHT HAND  Result Value Ref Range Status   Specimen Description BLOOD RIGHT HAND  Final   Special Requests   Final    BOTTLES DRAWN AEROBIC AND ANAEROBIC Blood Culture adequate volume   Culture   Final    NO GROWTH 2 DAYS Performed at [facility], [address] [place], [place] [address]    Report Status PENDING  Incomplete  SARS CORONAVIRUS 2 (TAT 6-24 HRS) Nasopharyngeal Nasopharyngeal Swab     Status: None   Collection Time: 05/21/20  5:41 AM   Specimen: Nasopharyngeal Swab  Result Value Ref Range Status   SARS Coronavirus 2 NEGATIVE NEGATIVE Final    Comment: (NOTE) SARS-CoV-2 target nucleic acids are NOT DETECTED.  The SARS-CoV-2 RNA is generally detectable in upper and lower respiratory specimens during  the acute phase of infection. Negative results do not preclude SARS-CoV-2 infection, do not rule out co-infections with other pathogens, and should not be used as the sole basis for treatment or other patient management decisions. Negative results must be combined with clinical observations, patient history, and epidemiological information. The expected result is Negative.  Fact Sheet for Patients: HairSlick.no  Fact Sheet for Healthcare Providers: quierodirigir.com  This test is not yet approved or cleared by the [place] FDA and  has been authorized for detection and/or diagnosis of SARS-CoV-2 by FDA under an Emergency Use Authorization (EUA). This EUA will remain  in effect (meaning this test can be used) for the duration of the COVID-19 declaration under Se ction 564(b)(1) of the Act, 21 U.S.C. section 360bbb-3(b)(1), unless the authorization is terminated or revoked sooner.  Performed at [facility], [address] [place], [place] [address]  Labs: BNP (last 3 results) Recent Labs    05/21/20 0348 05/22/20 1314  BNP 482.4* 272.6*   Basic Metabolic Panel: Recent Labs  Lab 05/21/20 0409 05/21/20 0419 05/23/20 0346  NA 135  --  134*  K 4.0  --  4.5  CL 101  --  97*  CO2 28  --  28  GLUCOSE 224*  --  125*  BUN 10  --  12  CREATININE 1.20  --  1.10  CALCIUM 8.5*  --  8.9  MG  --  1.9 1.7   Liver Function Tests: Recent Labs  Lab 05/21/20 0409  AST 23  ALT 22  ALKPHOS 94  BILITOT 0.5  PROT 6.4*  ALBUMIN 3.1*   No results for input(s): LIPASE, AMYLASE in the last 168 hours. No results for input(s): AMMONIA in the last 168 hours. CBC: Recent Labs  Lab 05/21/20 0346 05/23/20 0346  WBC 12.8* 5.9  NEUTROABS 11.3*  --   HGB 13.2 12.2*  HCT 41.5 36.0*  MCV 90.0 85.3  PLT 458* 368   Cardiac Enzymes: No results for input(s): CKTOTAL, CKMB, CKMBINDEX, TROPONINI in the last 168  hours. BNP: Invalid input(s): POCBNP CBG: No results for input(s): GLUCAP in the last 168 hours. D-Dimer No results for input(s): DDIMER in the last 72 hours. Hgb A1c No results for input(s): HGBA1C in the last 72 hours. Lipid Profile No results for input(s): CHOL, HDL, LDLCALC, TRIG, CHOLHDL, LDLDIRECT in the last 72 hours. Thyroid function studies Recent Labs    05/21/20 1010  TSH 0.104*   Anemia work up No results for input(s): VITAMINB12, FOLATE, FERRITIN, TIBC, IRON, RETICCTPCT in the last 72 hours. Urinalysis    Component Value Date/Time   COLORURINE YELLOW 06/25/2014 1621   APPEARANCEUR CLEAR 06/25/2014 1621   LABSPEC 1.014 06/25/2014 1621   PHURINE 8.0 06/25/2014 1621   GLUCOSEU NEGATIVE 06/25/2014 1621   HGBUR NEGATIVE 06/25/2014 1621   BILIRUBINUR NEGATIVE 06/25/2014 1621   KETONESUR NEGATIVE 06/25/2014 1621   PROTEINUR NEGATIVE 06/25/2014 1621   UROBILINOGEN 0.2 06/25/2014 1621   NITRITE NEGATIVE 06/25/2014 1621   LEUKOCYTESUR NEGATIVE 06/25/2014 1621   Sepsis Labs Invalid input(s): PROCALCITONIN,  WBC,  LACTICIDVEN Microbiology Recent Results (from the past 240 hour(s))  Blood culture (routine single)     Status: None (Preliminary result)   Collection Time: 05/21/20  4:25 AM   Specimen: BLOOD RIGHT HAND  Result Value Ref Range Status   Specimen Description BLOOD RIGHT HAND  Final   Special Requests   Final    BOTTLES DRAWN AEROBIC AND ANAEROBIC Blood Culture adequate volume   Culture   Final    NO GROWTH 2 DAYS Performed at [facility], [address], [place], [place] [address]    Report Status PENDING  Incomplete  SARS CORONAVIRUS 2 (TAT 6-24 HRS) Nasopharyngeal Nasopharyngeal Swab     Status: None   Collection Time: 05/21/20  5:41 AM   Specimen: Nasopharyngeal Swab  Result Value Ref Range Status   SARS Coronavirus 2 NEGATIVE NEGATIVE Final    Comment: (NOTE) SARS-CoV-2 target nucleic acids are NOT DETECTED.  The SARS-CoV-2 RNA is  generally detectable in upper and lower respiratory specimens during the acute phase of infection. Negative results do not preclude SARS-CoV-2 infection, do not rule out co-infections with other pathogens, and should not be used as the sole basis for treatment or other patient management decisions. Negative results must be combined with clinical observations, patient history, and epidemiological  information. The expected result is Negative.  Fact Sheet for Patients: HairSlick.no  Fact Sheet for Healthcare Providers: quierodirigir.com  This test is not yet approved or cleared by the [place] FDA and  has been authorized for detection and/or diagnosis of SARS-CoV-2 by FDA under an Emergency Use Authorization (EUA). This EUA will remain  in effect (meaning this test can be used) for the duration of the COVID-19 declaration under Se ction 564(b)(1) of the Act, 21 U.S.C. section 360bbb-3(b)(1), unless the authorization is terminated or revoked sooner.  Performed at [facility], [address], [place], [place] [address]      Time coordinating discharge:  I have spent 35 minutes face to face with the patient and on the ward discussing the patients care, assessment, plan and disposition with other care givers. >50% of the time was devoted counseling the patient about the risks and benefits of treatment/Discharge disposition and coordinating care.   SIGNED:   Dimple Nanas, MD  [facility] 05/23/2020, 12:10 PM   If 7PM-7AM, please contact night-coverage

## 2020-05-23 NOTE — Plan of Care (Signed)
  Problem: Education: Goal: Knowledge of General Education information will improve Description: Including pain rating scale, medication(s)/side effects and non-pharmacologic comfort measures Outcome: Adequate for Discharge   

## 2020-05-23 NOTE — Plan of Care (Signed)

## 2020-05-23 NOTE — TOC Progression Note (Addendum)
Transition of Care [facility]) - Progression Note    Patient Details  Name: Todd [name] MRN: [id number] Date of Birth: Jan 09, 1971  [facility] CM/SW Contact  Lorri Frederick, LCSW Phone Number: 05/23/2020, 8:48 AM  Clinical Narrative:   CSW made attempts to contact pt ex wife and both sons regarding housing issues.  Messages left with ex wife and son Ephriam Knuckles.  Son Wilmon Pali mailbox full, text message sent.   CSW spoke with pt in room.  Pt has been unsuccessful in reaching any of the above individuals as well.  Pt did call [employer] but was only able to leave a message.  CSW encouraged him to continue making attempts to contact them.  CSW discussed shelter options in [place] through [facility] and pt continues to report he is not excited about shelter. Pt reports that he thinks he can get his mother to pay for a hotel room tonight at [employer] at [facility] and [address] and he believes this is his best option.  He asked if hospital can provide transportation to that site. He will make attempts to contact mother.     Expected Discharge Plan: Homeless Shelter Barriers to Discharge: Continued Medical Work up  Expected Discharge Plan and Services Expected Discharge Plan: Homeless Shelter In-house Referral: Clinical Social Work   Post Acute Care Choice: NA Living arrangements for the past 2 months: No permanent address Expected Discharge Date: 05/23/20                                     Social Determinants of Health (SDOH) Interventions    Readmission Risk Interventions No flowsheet data found.

## 2020-05-23 NOTE — Progress Notes (Signed)
Discharge instructions (including medications) discussed with and copy provided to patient/caregiver 

## 2020-05-28 LAB — CULTURE, BLOOD (SINGLE)
Culture: NO GROWTH
Special Requests: ADEQUATE

## 2020-06-23 ENCOUNTER — Other Ambulatory Visit: Payer: Self-pay

## 2020-06-23 ENCOUNTER — Emergency Department (HOSPITAL_COMMUNITY)
Admission: EM | Admit: 2020-06-23 | Discharge: 2020-06-23 | Disposition: A | Discharge status: Home / Self Care | Payer: Medicare (Managed Care) | Attending: Emergency Medicine | Admitting: Emergency Medicine

## 2020-06-23 ENCOUNTER — Emergency Department (HOSPITAL_COMMUNITY): Payer: Medicare (Managed Care)

## 2020-06-23 DIAGNOSIS — I10 Essential (primary) hypertension: Secondary | ICD-10-CM | POA: Diagnosis not present

## 2020-06-23 DIAGNOSIS — Z79899 Other long term (current) drug therapy: Secondary | ICD-10-CM | POA: Diagnosis not present

## 2020-06-23 DIAGNOSIS — M25559 Pain in unspecified hip: Secondary | ICD-10-CM | POA: Diagnosis present

## 2020-06-23 DIAGNOSIS — R600 Localized edema: Secondary | ICD-10-CM | POA: Diagnosis not present

## 2020-06-23 DIAGNOSIS — Z96641 Presence of right artificial hip joint: Secondary | ICD-10-CM | POA: Insufficient documentation

## 2020-06-23 DIAGNOSIS — M25552 Pain in left hip: Secondary | ICD-10-CM | POA: Diagnosis not present

## 2020-06-23 MED ORDER — HYDRALAZINE HCL 25 MG PO TABS
25.0000 mg | ORAL_TABLET | Freq: Once | ORAL | Status: AC
  Administered 2020-06-23: 25 mg via ORAL
  Filled 2020-06-23: qty 1

## 2020-06-23 MED ORDER — AMLODIPINE BESYLATE 5 MG PO TABS: 5.0000 mg | ORAL_TABLET | Freq: Every day | ORAL | 0 refills | Status: AC

## 2020-06-23 MED ORDER — PANTOPRAZOLE SODIUM 40 MG PO TBEC
40.0000 mg | DELAYED_RELEASE_TABLET | Freq: Every day | ORAL | Status: DC
  Administered 2020-06-23: 40 mg via ORAL
  Filled 2020-06-23: qty 3

## 2020-06-23 MED ORDER — TIZANIDINE HCL 4 MG PO TABS
6.0000 mg | ORAL_TABLET | Freq: Four times a day (QID) | ORAL | Status: DC
  Administered 2020-06-23: 6 mg via ORAL
  Filled 2020-06-23 (×2): qty 24

## 2020-06-23 MED ORDER — FLUOXETINE HCL 20 MG PO CAPS
40.0000 mg | ORAL_CAPSULE | Freq: Every day | ORAL | Status: DC
  Administered 2020-06-23: 40 mg via ORAL
  Filled 2020-06-23: qty 6

## 2020-06-23 MED ORDER — OXYCODONE HCL 15 MG PO TABS: 15.0000 mg | ORAL_TABLET | Freq: Four times a day (QID) | ORAL | 0 refills | Status: AC | PRN

## 2020-06-23 MED ORDER — PANTOPRAZOLE SODIUM 40 MG PO TBEC: 40.0000 mg | DELAYED_RELEASE_TABLET | Freq: Two times a day (BID) | ORAL | 0 refills | Status: AC

## 2020-06-23 MED ORDER — OXYCODONE HCL 5 MG PO TABS
15.0000 mg | ORAL_TABLET | Freq: Once | ORAL | Status: AC
  Administered 2020-06-23: 15 mg via ORAL
  Filled 2020-06-23: qty 3

## 2020-06-23 MED ORDER — HYDRALAZINE HCL 25 MG PO TABS
25.0000 mg | ORAL_TABLET | Freq: Three times a day (TID) | ORAL | Status: DC
  Administered 2020-06-23: 25 mg via ORAL
  Filled 2020-06-23: qty 9

## 2020-06-23 MED ORDER — AMLODIPINE BESYLATE 5 MG PO TABS
5.0000 mg | ORAL_TABLET | Freq: Every day | ORAL | Status: DC
  Administered 2020-06-23: 5 mg via ORAL
  Filled 2020-06-23: qty 3

## 2020-06-23 MED ORDER — TIZANIDINE HCL 6 MG PO CAPS: 6.0000 mg | ORAL_CAPSULE | Freq: Four times a day (QID) | ORAL | 0 refills | Status: AC

## 2020-06-23 MED ORDER — HYDRALAZINE HCL 25 MG PO TABS: 25.0000 mg | ORAL_TABLET | Freq: Three times a day (TID) | ORAL | 0 refills | Status: AC

## 2020-06-23 MED ORDER — GABAPENTIN 100 MG PO CAPS: 100.0000 mg | ORAL_CAPSULE | Freq: Every day | ORAL | 0 refills | Status: AC

## 2020-06-23 MED ORDER — GABAPENTIN 100 MG PO CAPS
100.0000 mg | ORAL_CAPSULE | Freq: Every day | ORAL | Status: DC
  Filled 2020-06-23: qty 3

## 2020-06-23 MED ORDER — FLUOXETINE HCL 20 MG PO TABS: 40.0000 mg | ORAL_TABLET | Freq: Every day | ORAL | 0 refills | Status: AC

## 2020-06-23 MED ORDER — AMLODIPINE BESYLATE 5 MG PO TABS
5.0000 mg | ORAL_TABLET | Freq: Every day | ORAL | Status: DC
  Administered 2020-06-23: 5 mg via ORAL
  Filled 2020-06-23: qty 1

## 2020-06-23 NOTE — ED Notes (Signed)
Patient requesting food.this nurse cleared with provider and patient may eat. Writer entered order for cardiac diet, notified secretary to place order , and notified patient.

## 2020-06-23 NOTE — ED Notes (Signed)
Patient states he needs ride home via [place] and wants to speak to SW. SW notified of patients request. Patient demanded pain pill from this nurse. Patient was educated per MD orders, patient will get dose of medications to take at time of discharge, along with refills of non-controlled medications. Controlled pain medications will be sent to pharmacy. Patient becoming agitated and states other hospitals are better than this one.

## 2020-06-23 NOTE — ED Notes (Signed)
Pt medications delivered by pharmacy. Social work aware and to call pt Benedetto Goad for transport from ED.
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
## 2020-06-23 NOTE — ED Notes (Signed)
Writer notified MD of high bp
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
## 2020-06-23 NOTE — Social Work (Signed)
CSW met with Pt at bedside. Pt voiced frustration and anger that his pain medication will not be refilled along with other medications upon d/c.  CSW explained to Pt that because meds are being refilled via inpatient pharmacy there is no access to narcotics for the refill for d/c.   CSW offered to set up [place] ride for Pt to leave but patient states that he refuses leave hospital without being given pain medication to take with him.  EDP updated.
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
## 2020-07-11 ENCOUNTER — Encounter (HOSPITAL_COMMUNITY): Payer: Self-pay | Admitting: Neurosurgery

## 2020-07-11 ENCOUNTER — Emergency Department (HOSPITAL_COMMUNITY): Payer: Medicare (Managed Care)

## 2020-07-11 ENCOUNTER — Observation Stay (HOSPITAL_COMMUNITY)
Admission: EM | Admit: 2020-07-11 | Discharge: 2020-07-12 | Disposition: A | Discharge status: Home / Self Care | Payer: Medicare (Managed Care) | Attending: Neurosurgery | Admitting: Neurosurgery

## 2020-07-11 DIAGNOSIS — I609 Nontraumatic subarachnoid hemorrhage, unspecified: Secondary | ICD-10-CM

## 2020-07-11 DIAGNOSIS — S0990XA Unspecified injury of head, initial encounter: Secondary | ICD-10-CM

## 2020-07-11 DIAGNOSIS — T1490XA Injury, unspecified, initial encounter: Secondary | ICD-10-CM

## 2020-07-11 DIAGNOSIS — Z96641 Presence of right artificial hip joint: Secondary | ICD-10-CM | POA: Insufficient documentation

## 2020-07-11 DIAGNOSIS — Y92410 Unspecified street and highway as the place of occurrence of the external cause: Secondary | ICD-10-CM | POA: Insufficient documentation

## 2020-07-11 DIAGNOSIS — R21 Rash and other nonspecific skin eruption: Secondary | ICD-10-CM | POA: Insufficient documentation

## 2020-07-11 DIAGNOSIS — Z20822 Contact with and (suspected) exposure to covid-19: Secondary | ICD-10-CM | POA: Diagnosis not present

## 2020-07-11 DIAGNOSIS — S066X0A Traumatic subarachnoid hemorrhage without loss of consciousness, initial encounter: Secondary | ICD-10-CM | POA: Diagnosis not present

## 2020-07-11 DIAGNOSIS — R109 Unspecified abdominal pain: Secondary | ICD-10-CM | POA: Diagnosis not present

## 2020-07-11 DIAGNOSIS — S0081XA Abrasion of other part of head, initial encounter: Secondary | ICD-10-CM | POA: Diagnosis present

## 2020-07-11 DIAGNOSIS — Z79899 Other long term (current) drug therapy: Secondary | ICD-10-CM | POA: Diagnosis not present

## 2020-07-11 DIAGNOSIS — Y900 Blood alcohol level of less than 20 mg/100 ml: Secondary | ICD-10-CM | POA: Diagnosis not present

## 2020-07-11 LAB — COMPREHENSIVE METABOLIC PANEL
ALT: 26 U/L (ref 0–44)
AST: 47 U/L — ABNORMAL HIGH (ref 15–41)
Albumin: 3.3 g/dL — ABNORMAL LOW (ref 3.5–5.0)
Alkaline Phosphatase: 63 U/L (ref 38–126)
Anion gap: 6 (ref 5–15)
BUN: 18 mg/dL (ref 6–20)
CO2: 25 mmol/L (ref 22–32)
Calcium: 8.7 mg/dL — ABNORMAL LOW (ref 8.9–10.3)
Chloride: 109 mmol/L (ref 98–111)
Creatinine, Ser: 1.1 mg/dL (ref 0.61–1.24)
GFR, Estimated: >60 mL/min (ref >60)
Glucose, Bld: 106 mg/dL — ABNORMAL HIGH (ref 70–99)
Potassium: 3.6 mmol/L (ref 3.5–5.1)
Sodium: 140 mmol/L (ref 135–145)
Total Bilirubin: 0.5 mg/dL (ref 0.3–1.2)
Total Protein: 6.1 g/dL — ABNORMAL LOW (ref 6.5–8.1)

## 2020-07-11 LAB — I-STAT CHEM 8, ED
BUN: 21 mg/dL — ABNORMAL HIGH (ref 6–20)
Calcium, Ion: 1.12 mmol/L — ABNORMAL LOW (ref 1.15–1.40)
Chloride: 107 mmol/L (ref 98–111)
Creatinine, Ser: 1.1 mg/dL (ref 0.61–1.24)
Glucose, Bld: 104 mg/dL — ABNORMAL HIGH (ref 70–99)
HCT: 34 % — ABNORMAL LOW (ref 39.0–52.0)
Hemoglobin: 11.6 g/dL — ABNORMAL LOW (ref 13.0–17.0)
Potassium: 3.6 mmol/L (ref 3.5–5.1)
Sodium: 141 mmol/L (ref 135–145)
TCO2: 24 mmol/L (ref 22–32)

## 2020-07-11 LAB — PROTIME-INR
INR: 1 (ref 0.8–1.2)
Prothrombin Time: 13.6 seconds (ref 11.4–15.2)

## 2020-07-11 LAB — CBC
HCT: 32.3 % — ABNORMAL LOW (ref 39.0–52.0)
Hemoglobin: 10.4 g/dL — ABNORMAL LOW (ref 13.0–17.0)
MCH: 29 pg (ref 26.0–34.0)
MCHC: 32.2 g/dL (ref 30.0–36.0)
MCV: 90 fL (ref 80.0–100.0)
Platelets: 263 10*3/uL (ref 150–400)
RBC: 3.59 MIL/uL — ABNORMAL LOW (ref 4.22–5.81)
RDW: 13.6 % (ref 11.5–15.5)
WBC: 7.5 10*3/uL (ref 4.0–10.5)
nRBC: 0 % (ref 0.0–0.2)

## 2020-07-11 LAB — RESP PANEL BY RT-PCR (FLU A&B, COVID) ARPGX2
Influenza A by PCR: NEGATIVE
Influenza B by PCR: NEGATIVE
SARS Coronavirus 2 by RT PCR: NEGATIVE

## 2020-07-11 LAB — LACTIC ACID, PLASMA: Lactic Acid, Venous: 1.6 mmol/L (ref 0.5–1.9)

## 2020-07-11 LAB — ETHANOL: Alcohol, Ethyl (B): <10 mg/dL (ref <10)

## 2020-07-11 LAB — HIV ANTIBODY (ROUTINE TESTING W REFLEX): HIV Screen 4th Generation wRfx: NONREACTIVE

## 2020-07-11 MED ORDER — OXYCODONE HCL 5 MG PO TABS
15.0000 mg | ORAL_TABLET | Freq: Four times a day (QID) | ORAL | Status: DC | PRN
  Administered 2020-07-11 – 2020-07-12 (×3): 15 mg via ORAL
  Filled 2020-07-11 (×3): qty 3

## 2020-07-11 MED ORDER — ACETAMINOPHEN 650 MG RE SUPP: 650.0000 mg | Freq: Four times a day (QID) | RECTAL | Status: DC | PRN

## 2020-07-11 MED ORDER — POLYETHYLENE GLYCOL 3350 17 G PO PACK: 17.0000 g | PACK | Freq: Every day | ORAL | Status: DC | PRN

## 2020-07-11 MED ORDER — FENTANYL CITRATE (PF) 100 MCG/2ML IJ SOLN
50.0000 ug | Freq: Once | INTRAMUSCULAR | Status: AC
  Administered 2020-07-11: 50 ug via INTRAVENOUS
  Filled 2020-07-11: qty 2

## 2020-07-11 MED ORDER — SODIUM CHLORIDE 0.9 % IV SOLN: 250.0000 mL | INTRAVENOUS | Status: DC | PRN

## 2020-07-11 MED ORDER — FLEET ENEMA 7-19 GM/118ML RE ENEM: 1.0000 | ENEMA | Freq: Once | RECTAL | Status: DC | PRN

## 2020-07-11 MED ORDER — ONDANSETRON HCL 4 MG PO TABS: 4.0000 mg | ORAL_TABLET | Freq: Four times a day (QID) | ORAL | Status: DC | PRN

## 2020-07-11 MED ORDER — METOPROLOL TARTRATE 5 MG/5ML IV SOLN
5.0000 mg | Freq: Four times a day (QID) | INTRAVENOUS | Status: DC | PRN
  Administered 2020-07-11 – 2020-07-12 (×2): 5 mg via INTRAVENOUS
  Filled 2020-07-11 (×2): qty 5

## 2020-07-11 MED ORDER — SODIUM CHLORIDE 0.9% FLUSH: 3.0000 mL | INTRAVENOUS | Status: DC | PRN

## 2020-07-11 MED ORDER — BISACODYL 10 MG RE SUPP: 10.0000 mg | Freq: Every day | RECTAL | Status: DC | PRN

## 2020-07-11 MED ORDER — HYDROMORPHONE HCL 1 MG/ML IJ SOLN
0.5000 mg | INTRAMUSCULAR | Status: AC | PRN
  Administered 2020-07-11 – 2020-07-12 (×5): 1 mg via INTRAVENOUS
  Filled 2020-07-11 (×5): qty 1

## 2020-07-11 MED ORDER — SODIUM CHLORIDE 0.9% FLUSH
3.0000 mL | Freq: Two times a day (BID) | INTRAVENOUS | Status: DC
Start: 2020-07-11 — End: 2020-07-12

## 2020-07-11 MED ORDER — ACETAMINOPHEN 325 MG PO TABS
650.0000 mg | ORAL_TABLET | Freq: Four times a day (QID) | ORAL | Status: DC | PRN
  Administered 2020-07-12: 650 mg via ORAL
  Filled 2020-07-11: qty 2

## 2020-07-11 MED ORDER — ONDANSETRON HCL 4 MG/2ML IJ SOLN: 4.0000 mg | Freq: Four times a day (QID) | INTRAMUSCULAR | Status: DC | PRN

## 2020-07-11 MED ORDER — DOCUSATE SODIUM 100 MG PO CAPS
100.0000 mg | ORAL_CAPSULE | Freq: Two times a day (BID) | ORAL | Status: DC
  Administered 2020-07-11 – 2020-07-12 (×2): 100 mg via ORAL
  Filled 2020-07-11 (×2): qty 1

## 2020-07-11 MED ORDER — IOHEXOL 300 MG/ML  SOLN
100.0000 mL | Freq: Once | INTRAMUSCULAR | Status: AC | PRN
  Administered 2020-07-11: 100 mL via INTRAVENOUS
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
## 2020-07-12 ENCOUNTER — Encounter (HOSPITAL_COMMUNITY): Payer: Self-pay | Admitting: Registered Nurse
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
## 2020-07-12 NOTE — Discharge Instructions (Signed)
Head Injury, Adult There are many types of head injuries. They can be as minor as a small bump. Some head injuries can be worse. Worse injuries include:  A strong hit to the head that shakes the brain back and forth, causing damage (concussion).  A bruise (contusion) of the brain. This means there is bleeding in the brain that can cause swelling.  A cracked skull (skull fracture).  Bleeding in the brain that gathers, gets thick (makes a clot), and forms a bump (hematoma). Most problems from a head injury come in the first 24 hours. However, you may still have side effects up to 7-10 days after your injury. It is important to watch your condition for any changes. You may need to be watched in the emergency department or urgent care, or you may need to stay in the hospital. What are the causes? There are many possible causes of a head injury. A serious head injury may be caused by:  A car accident.  Bicycle or motorcycle accidents.  Sports injuries.  Falls.  Being hit by an object. What are the signs or symptoms? Symptoms of a head injury include a bruise, bump, or bleeding where the injury happened. Other physical symptoms may include:  Headache.  Feeling like you may vomit (nauseous) or vomiting.  Dizziness.  Blurred or double vision.  Being uncomfortable around bright lights or loud noises.  Shaking movements that you cannot control (seizures).  Feeling tired.  Trouble being woken up.  Fainting or loss of consciousness. Mental or emotional symptoms may include:  Feeling grumpy or cranky.  Confusion and memory problems.  Having trouble paying attention or concentrating.  Changes in eating or sleeping habits.  Feeling worried or nervous (anxious).  Feeling sad (depressed). How is this treated? Treatment for this condition depends on how severe the injury is and the type of injury you have. The main goal is to prevent problems and to allow the brain time to  heal. Mild head injury If you have a mild head injury, you may be sent home, and treatment may include:  Being watched. A responsible adult should stay with you for 24 hours after your injury and check on you often.  Physical rest.  Brain rest.  Pain medicines. Severe head injury If you have a severe head injury, treatment may include:  Being watched closely. This includes staying in the hospital.  Medicines to: ? Help with pain. ? Prevent seizures. ? Help with brain swelling.  Protecting your airway and using a machine that helps you breathe (ventilator).  Treatments to watch for and manage swelling inside the brain.  Brain surgery. This may be needed to: ? Remove a collection of blood or blood clots. ? Stop the bleeding. ? Remove a part of the skull. This allows room for the brain to swell. Follow these instructions at home: Activity  Rest.  Avoid activities that are hard or tiring.  Make sure you get enough sleep.  Let your brain rest. Do this by limiting activities that need a lot of thought or attention, such as: ? Watching TV. ? Playing memory games and puzzles. ? Job-related work or homework. ? Working on the computer, social media, and texting.  Avoid activities that could cause another head injury until your doctor says it is okay. This includes playing sports. Having another head injury, especially before the first one has healed, can be dangerous.  Ask your doctor when it is safe for you to go back to   your normal activities, such as work or school. Ask your doctor for a step-by-step plan for slowly going back to your normal activities.  Ask your doctor when you can drive, ride a bicycle, or use heavy machinery. Do not do these activities if you are dizzy. Lifestyle  Do not drink alcohol until your doctor says it is okay.  Do not use drugs.  If it is harder than usual to remember things, write them down.  If you are easily distracted, try to do one  thing at a time.  Talk with family members or close friends when making important decisions.  Tell your friends, family, a trusted co-worker, and work manager about your injury, symptoms, and limits (restrictions). Have them watch for any problems that are new or getting worse.   General instructions  Take over-the-counter and prescription medicines only as told by your doctor.  Have someone stay with you for 24 hours after your head injury. This person should watch you for any changes in your symptoms and be ready to get help.  Keep all follow-up visits as told by your doctor. This is important. How is this prevented?  Work on your balance and strength. This can help you avoid falls.  Wear a seat belt when you are in a moving vehicle.  Wear a helmet when you: ? Ride a bicycle. ? Ski. ? Do any other sport or activity that has a risk of injury.  If you drink alcohol: ? Limit how much you use to:  0-1 drink a day for nonpregnant women.  0-2 drinks a day for men. ? Be aware of how much alcohol is in your drink. In the [place] one drink equals one 12 oz bottle of beer (355 mL), one 5 oz glass of wine (148 mL), or one 1 oz glass of hard liquor (44 mL).  Make your home safer by: ? Getting rid of clutter from the floors and stairs. This includes things that can make you trip. ? Using grab bars in bathrooms and handrails by stairs. ? Placing non-slip mats on floors and in bathtubs. ? Putting more light in dim areas. Where to find more information  Centers for Disease Control and Prevention: www.cdc.gov Get help right away if:  You have: ? A very bad headache that is not helped by medicine. ? Trouble walking or weakness in your arms and legs. ? Clear or bloody fluid coming from your nose or ears. ? Changes in how you see (vision). ? A seizure. ? More confusion or more grumpy moods.  Your symptoms get worse.  You are sleepier than normal and have trouble staying awake.  You  lose your balance.  The black centers of your eyes (pupils) change in size.  Your speech is slurred.  Your dizziness gets worse.  You vomit. These symptoms may be an emergency. Do not wait to see if the symptoms will go away. Get medical help right away. Call your local emergency services (911 in the [place] Do not drive yourself to the hospital. Summary  Head injuries can be as minor as a small bump. Some head injuries can be worse.  Treatment for this condition depends on how severe the injury is and the type of injury you have.  Have someone stay with you for 24 hours after your head injury.  Ask your doctor when it is safe for you to go back to your normal activities, such as work or school.  To prevent a head injury,   wear a seat belt in a car, wear a helmet when you use a bicycle, limit your alcohol use, and make your home safer. This information is not intended to replace advice given to you by your health care provider. Make sure you discuss any questions you have with your health care provider. Document Revised: 01/06/2019 Document Reviewed: 01/06/2019 Elsevier Patient Education  2021 [employer]  

## 2020-07-12 NOTE — Plan of Care (Signed)
  Problem: Activity: Goal: Risk for activity intolerance will decrease Outcome: Progressing   Problem: Nutrition: Goal: Adequate nutrition will be maintained Outcome: Progressing   Problem: Coping: Goal: Level of anxiety will decrease Outcome: Progressing   Problem: Safety: Goal: Ability to remain free from injury will improve Outcome: Progressing   

## 2020-07-12 NOTE — Progress Notes (Signed)
Discharge instructions (including medications) discussed with and copy provided to patient/caregiver 
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
## 2020-07-27 ENCOUNTER — Encounter (HOSPITAL_COMMUNITY): Payer: Self-pay | Admitting: Emergency Medicine

## 2020-07-27 ENCOUNTER — Other Ambulatory Visit: Payer: Self-pay

## 2020-07-27 ENCOUNTER — Emergency Department (HOSPITAL_COMMUNITY)
Admission: EM | Admit: 2020-07-27 | Discharge: 2020-07-27 | Disposition: A | Discharge status: Home / Self Care | Payer: Medicare (Managed Care) | Attending: Emergency Medicine | Admitting: Emergency Medicine

## 2020-07-27 DIAGNOSIS — I10 Essential (primary) hypertension: Secondary | ICD-10-CM | POA: Insufficient documentation

## 2020-07-27 DIAGNOSIS — S0992XA Unspecified injury of nose, initial encounter: Secondary | ICD-10-CM | POA: Insufficient documentation

## 2020-07-27 DIAGNOSIS — Y92009 Unspecified place in unspecified non-institutional (private) residence as the place of occurrence of the external cause: Secondary | ICD-10-CM | POA: Insufficient documentation

## 2020-07-27 DIAGNOSIS — Z96641 Presence of right artificial hip joint: Secondary | ICD-10-CM | POA: Diagnosis not present

## 2020-07-27 DIAGNOSIS — S0993XA Unspecified injury of face, initial encounter: Secondary | ICD-10-CM

## 2020-07-27 DIAGNOSIS — Z79899 Other long term (current) drug therapy: Secondary | ICD-10-CM | POA: Insufficient documentation

## 2020-07-27 MED ORDER — ACETAMINOPHEN 325 MG PO TABS
650.0000 mg | ORAL_TABLET | Freq: Once | ORAL | Status: AC
  Administered 2020-07-27: 650 mg via ORAL
  Filled 2020-07-27: qty 2
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
## 2020-07-27 NOTE — Discharge Instructions (Signed)
As discussed, I am unable to rule out any life threatening conditions without the CT scans given you just had a brain bleed. It appears like your nose may be broken.  I have included the number of the ear nose and throat doctor.  Please call Monday to schedule an appointment for further evaluation.  You may take over-the-counter Tylenol as needed for pain.  Please follow-up with PCP if symptoms not improved within the next week.  Return to the ER for new or worsening symptoms.

## 2020-08-17 ENCOUNTER — Other Ambulatory Visit: Payer: Self-pay

## 2020-08-17 ENCOUNTER — Emergency Department (HOSPITAL_COMMUNITY)
Admission: EM | Admit: 2020-08-17 | Discharge: 2020-08-17 | Disposition: A | Discharge status: Home / Self Care | Payer: Medicare (Managed Care) | Attending: Emergency Medicine | Admitting: Emergency Medicine

## 2020-08-17 ENCOUNTER — Encounter (HOSPITAL_COMMUNITY): Payer: Self-pay | Admitting: *Deleted

## 2020-08-17 DIAGNOSIS — I10 Essential (primary) hypertension: Secondary | ICD-10-CM | POA: Insufficient documentation

## 2020-08-17 DIAGNOSIS — Z96641 Presence of right artificial hip joint: Secondary | ICD-10-CM | POA: Insufficient documentation

## 2020-08-17 DIAGNOSIS — Z79899 Other long term (current) drug therapy: Secondary | ICD-10-CM | POA: Insufficient documentation

## 2020-08-17 DIAGNOSIS — L02811 Cutaneous abscess of head [any part, except face]: Secondary | ICD-10-CM | POA: Diagnosis present

## 2020-08-17 MED ORDER — LIDOCAINE HCL (PF) 1 % IJ SOLN
2.1000 mL | Freq: Once | INTRAMUSCULAR | Status: AC
  Administered 2020-08-17: 2.1 mL
  Filled 2020-08-17: qty 5

## 2020-08-17 MED ORDER — LIDOCAINE-EPINEPHRINE (PF) 2 %-1:200000 IJ SOLN
20.0000 mL | Freq: Once | INTRAMUSCULAR | Status: AC
  Administered 2020-08-17: 20 mL via INTRADERMAL
  Filled 2020-08-17: qty 20

## 2020-08-17 MED ORDER — IBUPROFEN 800 MG PO TABS
800.0000 mg | ORAL_TABLET | Freq: Once | ORAL | Status: AC
  Administered 2020-08-17: 800 mg via ORAL
  Filled 2020-08-17: qty 1

## 2020-08-17 MED ORDER — CEFTRIAXONE SODIUM 1 G IJ SOLR
1.0000 g | Freq: Once | INTRAMUSCULAR | Status: AC
  Administered 2020-08-17: 1 g via INTRAMUSCULAR
  Filled 2020-08-17: qty 10

## 2020-08-17 MED ORDER — DOXYCYCLINE HYCLATE 100 MG PO CAPS: 100.0000 mg | ORAL_CAPSULE | Freq: Two times a day (BID) | ORAL | 0 refills | Status: DC

## 2020-08-17 NOTE — ED Provider Notes (Signed)
[name] EMERGENCY DEPARTMENT Provider Note   CSN: [id number] Arrival date & time: 08/17/20  0017     History Chief Complaint  Patient presents with   Headache    Todd [name] is a 50 y.o. male  The history is provided by the patient.  Abscess Location:  Head/neck Head/neck abscess location:  Scalp Size:  4 cm Abscess quality: fluctuance, induration and painful   Abscess quality: not draining   Red streaking: no   Duration:  3 days Progression:  Worsening Pain details:    Quality:  Pressure and aching   Severity:  Severe   Timing:  Constant   Progression:  Worsening Chronicity:  New Context: skin injury   Context: not diabetes and not immunosuppression   Relieved by:  Nothing Exacerbated by: pressure. 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   GERD (gastroesophageal reflux disease)    H/O hiatal hernia    Hypertension     Patient Active Problem List   Diagnosis Date Noted   Subarachnoid hemorrhage (HCC) 07/11/2020   Acute respiratory failure with hypoxia (HCC) 05/21/2020   Self-injurious behavior 05/07/2020   Non-suicidal self-harm 05/07/2020   Acute stress reaction causing mixed disturbance of emotion and conduct 05/07/2020   Opioid dependence with opioid-induced mood disorder (HCC)    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 05/14/2019   Closed dislocation of right hip (HCC) 04/29/2019   Hypoxia 10/07/2015   Mechanical complication of internal orthopedic device (HCC) 03/16/2015   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   Anemia 07/16/2014   Altered mental status 10/20/2013   Dislocation of hip prosthesis (HCC)  10/20/2013   CAP (community acquired pneumonia) 10/20/2013   Acute GI bleeding 09/19/2013   Acute blood loss anemia 09/19/2013   Essential hypertension 09/19/2013   Sinus tachycardia 09/19/2013   GASTROESOPHAGEAL REFLUX, NO ESOPHAGITIS 05/06/2006    Past Surgical History:  Procedure Laterality Date   APPENDECTOMY     as child   COLONOSCOPY WITH PROPOFOL  02/17/2012   Procedure: COLONOSCOPY WITH PROPOFOL;  Surgeon: Willis Modena, MD;  Location: [facility] ENDOSCOPY;  Service: Endoscopy;  Laterality: N/A;   ESOPHAGOGASTRODUODENOSCOPY Left 09/19/2013   Procedure: ESOPHAGOGASTRODUODENOSCOPY (EGD);  Surgeon: Willis Modena, MD;  Location: [facility] ENDOSCOPY;  Service: Endoscopy;  Laterality: Left;   ESOPHAGOGASTRODUODENOSCOPY (EGD) WITH PROPOFOL  02/17/2012   Procedure: ESOPHAGOGASTRODUODENOSCOPY (EGD) WITH PROPOFOL;  Surgeon: Willis Modena, MD;  Location: [facility] ENDOSCOPY;  Service: Endoscopy;  Laterality: N/A;   HIP ARTHROPLASTY Right    HIP CLOSED REDUCTION Right 10/20/2013   Procedure: CLOSED REDUCTION HIP;  Surgeon: Eldred Manges, MD;  Location: [facility] OR;  Service: Orthopedics;  Laterality: Right;   HIP CLOSED REDUCTION Right 02/18/2015   Procedure: CLOSED REDUCTION HIP;  Surgeon: Jodi Geralds, MD;  Location: [facility] ORS;  Service: Orthopedics;  Laterality: Right;   HIP CLOSED REDUCTION Right 03/16/2015   Procedure: CLOSED REDUCTION HIP;  Surgeon: Kathryne Hitch, MD;  Location: [facility] OR;  Service: Orthopedics;  Laterality: Right;   HIP CLOSED REDUCTION Right 04/29/2019   Procedure: CLOSED MANIPULATION HIP;  Surgeon: Teryl Lucy, MD;  Location: [facility] ORS;  Service: Orthopedics;  Laterality: Right;  HIP CLOSED REDUCTION Right 05/14/2019   Procedure: CLOSED REDUCTION HIP;  Surgeon: Deeann Saint, MD;  Location: [facility] ORS;  Service: Orthopedics;  Laterality: Right;   HIP CLOSED REDUCTION Right 05/22/2019   Procedure: CLOSED MANIPULATION HIP;  Surgeon: Teryl Lucy, MD;  Location: [facility] ORS;  Service: Orthopedics;   Laterality: Right;   JOINT REPLACEMENT  2005   Right Hip       Family History  Problem Relation Age of Onset   Diabetes Mother     Social History   Tobacco Use   Smoking status: Never   Smokeless tobacco: Never  Vaping Use   Vaping Use: Never used  Substance Use Topics   Alcohol use: No   Drug use: Not Currently    Types: IV    Home Medications Prior to Admission medications   Medication Sig Start Date End Date Taking? Authorizing Provider  albuterol (VENTOLIN HFA) 108 (90 Base) MCG/ACT inhaler Inhale 2 puffs into the lungs every 6 (six) hours as needed for wheezing or shortness of breath. 05/23/20   Amin, Ankit Chirag, MD  albuterol (VENTOLIN HFA) 108 (90 Base) MCG/ACT inhaler INHALE 2 PUFFS INTO THE LUNGS EVERY SIX HOURS AS NEEDED FOR WHEEZING OR SHORTNESS OF BREATH. Patient not taking: Reported on 06/23/2020 05/23/20 05/23/21  Dimple Nanas, MD  amLODipine (NORVASC) 5 MG tablet Take 1 tablet (5 mg total) by mouth daily. 05/23/20   Amin, Ankit Chirag, MD  amLODipine (NORVASC) 5 MG tablet TAKE 1 TABLET (5 MG TOTAL) BY MOUTH DAILY. Patient not taking: Reported on 06/23/2020 05/23/20 05/23/21  Dimple Nanas, MD  amLODipine (NORVASC) 5 MG tablet Take 1 tablet (5 mg total) by mouth daily for 4 days. 06/23/20 06/27/20  Tegeler, Canary Brim, MD  amLODipine (NORVASC) 5 MG tablet Take 1 tablet by mouth daily. 05/23/20   [provider]  dexmethylphenidate (FOCALIN) 10 MG tablet Take 10 mg by mouth 2 (two) times daily. 07/11/20   [provider]  doxycycline (VIBRA-TABS) 100 MG tablet TAKE 1 TABLET (100 MG TOTAL) BY MOUTH TWO TIMES DAILY FOR 5 DAYS. Patient not taking: Reported on 06/23/2020 05/23/20 05/23/21  Dimple Nanas, MD  FLUoxetine (PROZAC) 20 MG tablet Take 2 tablets (40 mg total) by mouth daily for 4 days. 06/23/20 06/27/20  Tegeler, Canary Brim, MD  FLUoxetine (PROZAC) 40 MG capsule Take 40 mg by mouth daily.    [provider]  FLUoxetine  (PROZAC) 40 MG capsule Take 40 mg by mouth daily. 06/09/20   [provider]  gabapentin (NEURONTIN) 100 MG capsule Take 100 mg by mouth at bedtime. 04/03/20   [provider]  gabapentin (NEURONTIN) 100 MG capsule Take 1 capsule (100 mg total) by mouth at bedtime for 4 days. 06/23/20 06/27/20  Tegeler, Canary Brim, MD  gabapentin (NEURONTIN) 100 MG capsule Take 100 mg by mouth daily. 07/11/20   [provider]  hydrALAZINE (APRESOLINE) 25 MG tablet TAKE 1 TABLET (25 MG TOTAL) BY MOUTH EVERY EIGHT HOURS. Patient taking differently: Take 25 mg by mouth 3 (three) times daily. 05/23/20 05/23/21  Amin, Loura Halt, MD  hydrALAZINE (APRESOLINE) 25 MG tablet Take 1 tablet (25 mg total) by mouth 3 (three) times daily for 4 days. 06/23/20 06/27/20  Tegeler, Canary Brim, MD  hydrALAZINE (APRESOLINE) 25 MG tablet Take 25 mg by mouth every 8 (eight) hours. 05/23/20   [provider]  ibuprofen (ADVIL) 200 MG tablet Take 600 mg by mouth every 6 (six) hours as needed for  fever, mild pain or headache.    [provider]  oxyCODONE (ROXICODONE) 15 MG immediate release tablet TAKE 1 TABLET (15 MG TOTAL) BY MOUTH EVERY SIX HOURS AS NEEDED FOR UP TO 5 DAYS. Patient not taking: Reported on 06/23/2020 05/23/20 11/19/20  Dimple Nanas, MD  oxyCODONE (ROXICODONE) 15 MG immediate release tablet Take 15 mg by mouth 4 (four) times daily as needed for pain. 07/11/20   [provider]  pantoprazole (PROTONIX) 40 MG tablet Take 40 mg by mouth 2 (two) times daily.  03/22/19   [provider]  pantoprazole (PROTONIX) 40 MG tablet Take 1 tablet (40 mg total) by mouth 2 (two) times daily for 4 days. 06/23/20 06/27/20  Tegeler, Canary Brim, MD  pantoprazole (PROTONIX) 40 MG tablet Take 1 tablet by mouth 2 (two) times daily. 04/10/20   [provider]  senna-docusate (SENOKOT-S) 8.6-50 MG tablet Take 1 tablet by mouth 2 (two) times daily as needed for moderate constipation.  05/23/20   Amin, Ankit Chirag, MD  senna-docusate (SENOKOT-S) 8.6-50 MG tablet TAKE 1 TABLET BY MOUTH TWO TIMES DAILY AS NEEDED FOR MODERATE CONSTIPATION. Patient not taking: Reported on 06/23/2020 05/23/20 05/23/21  Dimple Nanas, MD  testosterone cypionate (DEPOTESTOSTERONE CYPIONATE) 200 MG/ML injection Inject 200 mg into the muscle every 14 (fourteen) days.  11/11/18   [provider]  tiZANidine (ZANAFLEX) 4 MG tablet TAKE 1 AND 1/2 TABLETS (6 MG TOTAL) BY MOUTH FOUR TIMES DAILY FOR 5 DAYS. Patient not taking: Reported on 06/23/2020 05/23/20 05/23/21  Dimple Nanas, MD  tiZANidine (ZANAFLEX) 4 MG tablet Take 4 mg by mouth 3 (three) times daily as needed for muscle spasms. 06/30/20   [provider]  triamcinolone ointment (KENALOG) 0.1 % Apply 1 application topically 2 (two) times daily as needed (psoriasis).    [provider]    Allergies    Ambien [zolpidem tartrate], Nu-iron [polysaccharide iron complex], and Fentanyl  Review of Systems   Review of Systems  Constitutional:  Negative for fatigue and fever.  Gastrointestinal:  Negative for anorexia, nausea and vomiting.  Skin:  Positive for wound. Negative for rash.  Neurological:  Positive for headaches.   Physical Exam Updated Vital Signs BP (!) 108/91 (BP Location: Left Arm)   Pulse 94   Temp 98.9 F (37.2 C) (Oral)   Resp 20   Ht  (1.88 m)   Wt 81.6 kg   SpO2 100%   BMI 23.10 kg/m   Physical Exam Vitals and nursing note reviewed.  Constitutional:      General: He is not in acute distress.    Appearance: He is well-developed. He is not diaphoretic.  HENT:     Head: Normocephalic.     Comments: Small scab posterior scalp + erythema +induration +fluctuance  Eyes:     General: No scleral icterus.    Conjunctiva/sclera: Conjunctivae normal.  Cardiovascular:     Rate and Rhythm: Normal rate and regular rhythm.     Heart sounds: Normal heart sounds.  Pulmonary:     Effort:  Pulmonary effort is normal. No respiratory distress.     Breath sounds: Normal breath sounds.  Abdominal:     Palpations: Abdomen is soft.     Tenderness: There is no abdominal tenderness.  Musculoskeletal:     Cervical back: Normal range of motion and neck supple.  Skin:    General: Skin is warm and dry.  Neurological:     Mental Status: He is alert.  Psychiatric:        Behavior: Behavior normal.    ED Results / Procedures / Treatments   Labs (all labs ordered are listed, but only abnormal results are displayed) Labs Reviewed - No data to display  EKG None  Radiology No results found.  Procedures .Marland KitchenIncision and Drainage  Date/Time: 08/17/2020 11:32 AM Performed by: Arthor Captain, PA-C Authorized by: Arthor Captain, PA-C   Consent:    Consent obtained:  Verbal   Consent given by:  Patient   Risks discussed:  Bleeding, incomplete drainage, pain and damage to other organs   Alternatives discussed:  No treatment Universal protocol:    Procedure explained and questions answered to patient or proxy's satisfaction: yes     Relevant documents present and verified: yes     Test results available : yes     Imaging studies available: yes     Required blood products, implants, devices, and special equipment available: yes     Site/side marked: yes     Immediately prior to procedure, a time out was called: yes     Patient identity confirmed:  Verbally with patient Location:    Type:  Abscess Pre-procedure details:    Skin preparation:  Betadine Anesthesia:    Anesthesia method:  Local infiltration   Local anesthetic:  Lidocaine 1% WITH epi Procedure type:    Complexity:  Complex Procedure details:    Incision types:  Single straight   Incision depth:  Subcutaneous   Wound management:  Probed and deloculated, irrigated with saline and extensive cleaning   Drainage:  Purulent   Drainage amount:  Moderate   Packing materials:  1/4 in gauze Post-procedure details:     Procedure completion:  Tolerated well, no immediate complications   Medications Ordered in ED Medications  ibuprofen (ADVIL) tablet 800 mg (has no administration in time range)  lidocaine-EPINEPHrine (XYLOCAINE W/EPI) 2 %-1:100000 (with pres) injection 20 mL (has no administration in time range)    ED Course  I have reviewed the triage vital signs and the nursing notes.  Pertinent labs & imaging results that were available during my care of the patient were reviewed by me and considered in my medical decision making (see chart for details).    MDM Rules/Calculators/A&P                          Todd [name] presents with abscess. There is no area of retained pus after procedure. The presentation of Todd [name] is NOT consistent with necrotizing fascitis or osteomyolitis. There is no evidence of retained foreign body, neurovascular or tendon injury. The presentation of Todd [name] is NOT consistent with sepsis and/or bacteremia. Todd [name] meets outpatient criteria for treatment doxycycline and is sent home on empiric antibiotics covering the relevant bacteria.  Strict return and follow-up precautions have been given by me personally or by detailed written instructions verbalized by nursing staff using the teach back method to the patient/family/caregiver(s).  Data Reviewed/Counseling: I have reviewed the patient's vital signs, nursing notes, and other relevant tests/information. I had a detailed discussion regarding the historical points, exam findings, and any diagnostic results supporting the discharge diagnosis. I also discussed the need for outpatient follow-up and the need to return to the ED if symptoms worsen or if there are any questions or concerns that arise at home.  Final Clinical Impression(s) / ED Diagnoses Final diagnoses:  Abscess of scalp    Rx /  DC Orders ED Discharge Orders     None        Arthor CaptainHarris, Keela Rubert, PA-C 08/17/20 1133    Gilda CreasePollina, Christopher  J, MD 08/17/20 [id number]

## 2020-08-17 NOTE — ED Triage Notes (Signed)
[facility]  the pt is c/o a headache for 3 days.  He has ared infected appearing area to the top of his head he last had tylenol or advil yesterday

## 2020-08-17 NOTE — Discharge Instructions (Addendum)
Take your antibiotic 2x daily for 7 days with food.  INCISION AND DRAINAGE WOUND CARE Please have your packing removed in 2 days or sooner if you have concerns. Marland Kitchen  Keep area clean and dry for 24 hours. Do not remove bandage, if applied.  After 24 hours, remove bandage and wash wound gently with mild soap and warm water. Reapply a new bandage after cleaning wound, if directed.  Continue daily cleansing with soap and water   Seek medical careif you experience any of the following signs of infection: Swelling, redness, pus drainage, streaking, fever >101.0 F  Seek care if you experience excessive bleeding that does not stop after 15-20 minutes of constant, firm pressure.

## 2020-08-17 NOTE — ED Notes (Signed)
Patient has been discharged. We are awaiting a ride home at this time

## 2020-08-17 NOTE — ED Notes (Signed)
Jail officer is at the bedside.

## 2020-10-31 ENCOUNTER — Emergency Department (HOSPITAL_COMMUNITY): Payer: Medicare Other

## 2020-10-31 ENCOUNTER — Other Ambulatory Visit: Payer: Self-pay

## 2020-10-31 ENCOUNTER — Emergency Department (HOSPITAL_COMMUNITY)
Admission: EM | Admit: 2020-10-31 | Discharge: 2020-10-31 | Disposition: A | Discharge status: Home / Self Care | Payer: Medicare Other | Attending: Emergency Medicine | Admitting: Emergency Medicine

## 2020-10-31 DIAGNOSIS — M25551 Pain in right hip: Secondary | ICD-10-CM | POA: Diagnosis present

## 2020-10-31 DIAGNOSIS — Z96641 Presence of right artificial hip joint: Secondary | ICD-10-CM | POA: Insufficient documentation

## 2020-10-31 DIAGNOSIS — M25552 Pain in left hip: Secondary | ICD-10-CM | POA: Diagnosis not present

## 2020-10-31 DIAGNOSIS — I10 Essential (primary) hypertension: Secondary | ICD-10-CM | POA: Diagnosis not present

## 2020-10-31 DIAGNOSIS — Z79899 Other long term (current) drug therapy: Secondary | ICD-10-CM | POA: Insufficient documentation

## 2020-10-31 MED ORDER — OXYCODONE-ACETAMINOPHEN 5-325 MG PO TABS
1.0000 | ORAL_TABLET | Freq: Once | ORAL | Status: AC
  Administered 2020-10-31: 1 via ORAL
  Filled 2020-10-31: qty 1

## 2020-10-31 NOTE — ED Notes (Addendum)
Pt began to become verbally aggressive with hx of same and expressed he is not satisfied with his care tonight and threatened to "Call 911 to have an ambulance to take me to another hospital." MD aware.

## 2020-10-31 NOTE — ED Provider Notes (Signed)
[facility]-EMERGENCY DEPT Provider Note   CSN: [id number] Arrival date & time: 10/31/20  0134     History Chief Complaint  Patient presents with   Hip Pain    Todd [name] is a 50 y.o. male.  The history is provided by the patient and medical records.  Todd [name] is a 50 y.o. male who presents to the Emergency Department complaining of hip pain.  He presents to the ED for evaluation of bilateral hip pain. He states that he works [employer] and somehow while he was working he felt like his right hip dislocated. He has a history of recurrent dislocations. He felt it pop around 6 PM and fell to the ground. A coworker helped him up and he had a friend try to reduce his hip and feels like it did go back into place. He presents today because he can't sleep because the hip hurts. He also is out of his oxycodone. He he is scheduled to see a new pain management doctor the first week of September. Pain is from the right hip to the right upper thigh. No reports of fevers, nausea, vomiting.     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   GERD (gastroesophageal reflux disease)    H/O hiatal hernia    Hypertension     Patient Active Problem List   Diagnosis Date Noted   Subarachnoid hemorrhage (HCC) 07/11/2020   Acute respiratory failure with hypoxia (HCC) 05/21/2020   Self-injurious behavior 05/07/2020   Non-suicidal self-harm 05/07/2020   Acute stress reaction causing mixed disturbance of emotion and conduct 05/07/2020   Opioid dependence with opioid-induced mood disorder (HCC)    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 05/14/2019   Closed dislocation of right hip (HCC) 04/29/2019   Hypoxia 10/07/2015   Mechanical complication of internal orthopedic device (HCC) 03/16/2015   Failure of right total hip arthroplasty with dislocation of  hip (HCC) 02/18/2015   Anemia 07/16/2014   Altered mental status 10/20/2013   Dislocation of hip prosthesis (HCC) 10/20/2013   CAP (community acquired pneumonia) 10/20/2013   Acute GI bleeding 09/19/2013   Acute blood loss anemia 09/19/2013   Essential hypertension 09/19/2013   Sinus tachycardia 09/19/2013   GASTROESOPHAGEAL REFLUX, NO ESOPHAGITIS 05/06/2006    Past Surgical History:  Procedure Laterality Date   APPENDECTOMY     as child   COLONOSCOPY WITH PROPOFOL  02/17/2012   Procedure: COLONOSCOPY WITH PROPOFOL;  Surgeon: Willis Modena, MD;  Location: [facility] ENDOSCOPY;  Service: Endoscopy;  Laterality: N/A;   ESOPHAGOGASTRODUODENOSCOPY Left 09/19/2013   Procedure: ESOPHAGOGASTRODUODENOSCOPY (EGD);  Surgeon: Willis Modena, MD;  Location: [facility] ENDOSCOPY;  Service: Endoscopy;  Laterality: Left;   ESOPHAGOGASTRODUODENOSCOPY (EGD) WITH PROPOFOL  02/17/2012   Procedure: ESOPHAGOGASTRODUODENOSCOPY (EGD) WITH PROPOFOL;  Surgeon: Willis Modena, MD;  Location: [facility] ENDOSCOPY;  Service: Endoscopy;  Laterality: N/A;   HIP ARTHROPLASTY Right    HIP CLOSED REDUCTION Right 10/20/2013   Procedure: CLOSED REDUCTION HIP;  Surgeon: Eldred Manges, MD;  Location: [facility] OR;  Service: Orthopedics;  Laterality: Right;   HIP CLOSED REDUCTION Right 02/18/2015   Procedure: CLOSED REDUCTION HIP;  Surgeon: Jodi Geralds, MD;  Location: [facility] ORS;  Service: Orthopedics;  Laterality: Right;   HIP CLOSED REDUCTION Right 03/16/2015   Procedure: CLOSED REDUCTION HIP;  Surgeon: Kathryne Hitch, MD;  Location: [facility] OR;  Service: Orthopedics;  Laterality: Right;   HIP CLOSED REDUCTION Right 04/29/2019   Procedure: CLOSED MANIPULATION HIP;  Surgeon: Teryl Lucy, MD;  Location: [facility] ORS;  Service: Orthopedics;  Laterality: Right;   HIP CLOSED REDUCTION Right 05/14/2019   Procedure: CLOSED REDUCTION HIP;  Surgeon: Deeann Saint, MD;  Location: [facility] ORS;  Service: Orthopedics;  Laterality: Right;   HIP CLOSED REDUCTION Right  05/22/2019   Procedure: CLOSED MANIPULATION HIP;  Surgeon: Teryl Lucy, MD;  Location: [facility] ORS;  Service: Orthopedics;  Laterality: Right;   JOINT REPLACEMENT  2005   Right Hip       Family History  Problem Relation Age of Onset   Diabetes Mother     Social History   Tobacco Use   Smoking status: Never   Smokeless tobacco: Never  Vaping Use   Vaping Use: Never used  Substance Use Topics   Alcohol use: No   Drug use: Not Currently    Types: IV    Home Medications Prior to Admission medications   Medication Sig Start Date End Date Taking? Authorizing Provider  albuterol (VENTOLIN HFA) 108 (90 Base) MCG/ACT inhaler Inhale 2 puffs into the lungs every 6 (six) hours as needed for wheezing or shortness of breath. 05/23/20   Amin, Ankit Chirag, MD  albuterol (VENTOLIN HFA) 108 (90 Base) MCG/ACT inhaler INHALE 2 PUFFS INTO THE LUNGS EVERY SIX HOURS AS NEEDED FOR WHEEZING OR SHORTNESS OF BREATH. Patient not taking: Reported on 06/23/2020 05/23/20 05/23/21  Dimple Nanas, MD  amLODipine (NORVASC) 5 MG tablet Take 1 tablet (5 mg total) by mouth daily. 05/23/20   Amin, Ankit Chirag, MD  amLODipine (NORVASC) 5 MG tablet TAKE 1 TABLET (5 MG TOTAL) BY MOUTH DAILY. Patient not taking: Reported on 06/23/2020 05/23/20 05/23/21  Dimple Nanas, MD  amLODipine (NORVASC) 5 MG tablet Take 1 tablet (5 mg total) by mouth daily for 4 days. 06/23/20 06/27/20  Tegeler, Canary Brim, MD  amLODipine (NORVASC) 5 MG tablet Take 1 tablet by mouth daily. 05/23/20   [provider]  dexmethylphenidate (FOCALIN) 10 MG tablet Take 10 mg by mouth 2 (two) times daily. 07/11/20   [provider]  doxycycline (VIBRAMYCIN) 100 MG capsule Take 1 capsule (100 mg total) by mouth 2 (two) times daily. One po bid x 7 days 08/17/20   Arthor Captain, PA-C  FLUoxetine (PROZAC) 20 MG tablet Take 2 tablets (40 mg total) by mouth daily for 4 days. 06/23/20 06/27/20  Tegeler, Canary Brim, MD  FLUoxetine (PROZAC)  40 MG capsule Take 40 mg by mouth daily.    [provider]  FLUoxetine (PROZAC) 40 MG capsule Take 40 mg by mouth daily. 06/09/20   [provider]  gabapentin (NEURONTIN) 100 MG capsule Take 100 mg by mouth at bedtime. 04/03/20   [provider]  gabapentin (NEURONTIN) 100 MG capsule Take 1 capsule (100 mg total) by mouth at bedtime for 4 days. 06/23/20 06/27/20  Tegeler, Canary Brim, MD  gabapentin (NEURONTIN) 100 MG capsule Take 100 mg by mouth daily. 07/11/20   [provider]  hydrALAZINE (APRESOLINE) 25 MG tablet TAKE 1 TABLET (25 MG TOTAL) BY MOUTH EVERY EIGHT HOURS. Patient taking differently: Take 25 mg by mouth 3 (three) times daily. 05/23/20 05/23/21  Amin, Loura Halt, MD  hydrALAZINE (APRESOLINE) 25 MG tablet Take 1 tablet (25 mg total) by mouth 3 (three) times daily for 4 days. 06/23/20 06/27/20  Tegeler, Canary Brim, MD  hydrALAZINE (APRESOLINE) 25 MG tablet Take  25 mg by mouth every 8 (eight) hours. 05/23/20   [provider]  ibuprofen (ADVIL) 200 MG tablet Take 600 mg by mouth every 6 (six) hours as needed for fever, mild pain or headache.    [provider]  oxyCODONE (ROXICODONE) 15 MG immediate release tablet TAKE 1 TABLET (15 MG TOTAL) BY MOUTH EVERY SIX HOURS AS NEEDED FOR UP TO 5 DAYS. Patient not taking: Reported on 06/23/2020 05/23/20 11/19/20  Dimple Nanas, MD  oxyCODONE (ROXICODONE) 15 MG immediate release tablet Take 15 mg by mouth 4 (four) times daily as needed for pain. 07/11/20   [provider]  pantoprazole (PROTONIX) 40 MG tablet Take 40 mg by mouth 2 (two) times daily.  03/22/19   [provider]  pantoprazole (PROTONIX) 40 MG tablet Take 1 tablet (40 mg total) by mouth 2 (two) times daily for 4 days. 06/23/20 06/27/20  Tegeler, Canary Brim, MD  pantoprazole (PROTONIX) 40 MG tablet Take 1 tablet by mouth 2 (two) times daily. 04/10/20   [provider]  senna-docusate (SENOKOT-S) 8.6-50 MG  tablet Take 1 tablet by mouth 2 (two) times daily as needed for moderate constipation. 05/23/20   Amin, Ankit Chirag, MD  senna-docusate (SENOKOT-S) 8.6-50 MG tablet TAKE 1 TABLET BY MOUTH TWO TIMES DAILY AS NEEDED FOR MODERATE CONSTIPATION. Patient not taking: Reported on 06/23/2020 05/23/20 05/23/21  Dimple Nanas, MD  testosterone cypionate (DEPOTESTOSTERONE CYPIONATE) 200 MG/ML injection Inject 200 mg into the muscle every 14 (fourteen) days.  11/11/18   [provider]  tiZANidine (ZANAFLEX) 4 MG tablet TAKE 1 AND 1/2 TABLETS (6 MG TOTAL) BY MOUTH FOUR TIMES DAILY FOR 5 DAYS. Patient not taking: Reported on 06/23/2020 05/23/20 05/23/21  Dimple Nanas, MD  tiZANidine (ZANAFLEX) 4 MG tablet Take 4 mg by mouth 3 (three) times daily as needed for muscle spasms. 06/30/20   [provider]  triamcinolone ointment (KENALOG) 0.1 % Apply 1 application topically 2 (two) times daily as needed (psoriasis).    [provider]    Allergies    Ambien [zolpidem tartrate], Nu-iron [polysaccharide iron complex], and Fentanyl  Review of Systems   Review of Systems  All other systems reviewed and are negative.  Physical Exam Updated Vital Signs BP (!) 143/76   Pulse 94   Temp 98.2 F (36.8 C) (Oral)   Resp 18   Ht 6\' 2"  (1.88 m)   Wt 81.7 kg   SpO2 100%   BMI 23.13 kg/m   Physical Exam Vitals and nursing note reviewed.  Constitutional:      Appearance: He is well-developed.  HENT:     Head: Normocephalic and atraumatic.  Cardiovascular:     Rate and Rhythm: Normal rate and regular rhythm.     Heart sounds: No murmur heard. Pulmonary:     Effort: Pulmonary effort is normal. No respiratory distress.     Breath sounds: Normal breath sounds.  Abdominal:     Palpations: Abdomen is soft.     Tenderness: There is no abdominal tenderness. There is no guarding or rebound.  Musculoskeletal:        General: No tenderness.     Comments: 1+ nonpitting edema to BLE.  2+  DP pulses bilaterally.  There is mild tenderness to palpation over the right hip.  Able to lift BLE off the stretcher.    Skin:    General: Skin is warm and dry.  Neurological:     Mental Status: He is alert  and oriented to person, place, and time.     Comments: 5/5 strength in BLE with sensation to light touch intact in BLE.   Psychiatric:        Behavior: Behavior normal.    ED Results / Procedures / Treatments   Labs (all labs ordered are listed, but only abnormal results are displayed) Labs Reviewed - No data to display  EKG None  Radiology DG Hip Unilat W or Wo Pelvis 2-3 Views Right  Result Date: 10/31/2020 CLINICAL DATA:  Right hip dislocation, right hip pain EXAM: DG HIP (WITH OR WITHOUT PELVIS) 2-3V RIGHT COMPARISON:  None. FINDINGS: Single view radiograph of the pelvis and two view radiograph of the right hip demonstrates surgical changes of right hip long-stem arthroplasty. The distal aspect of the femoral stem component is not included on this examination. Cerclage wires surround the proximal femoral diaphysis. Normal alignment. No fracture or dislocation. Left hip joint space is preserved. Multiple phleboliths are seen within the pelvis. IMPRESSION: No acute fracture or dislocation. Electronically Signed   By: Helyn NumbersAshesh  Parikh M.D.   On: 10/31/2020 02:48    Procedures Procedures   Medications Ordered in ED Medications  oxyCODONE-acetaminophen (PERCOCET/ROXICET) 5-325 MG per tablet 1 tablet (1 tablet Oral Given 10/31/20 0207)    ED Course  I have reviewed the triage vital signs and the nursing notes.  Pertinent labs & imaging results that were available during my care of the patient were reviewed by me and considered in my medical decision making (see chart for details).    MDM Rules/Calculators/A&P                          Pt with hx/o prior hip arthroplasty with recurrent dislocation here for evaluation of right hip pain. He states that it dislocated and a coworker  reduced it earlier today. He is also out of his pain medications in his requesting pain medication refill until his next pain management appointment. On examination he is non-toxic appearing and well perfused. No evidence of acute infectious process. Imaging is negative for hardware failure, dislocation. He is able to ambulate. He was treated with pain medication the emergency department. Discussed that chronic pain medications cannot be refilled in the emergency department and this will have to happen by his primary provider. On record review patient was kicked out of his pain management center due to positive drug screen. Do not feel at this time that is appropriate for medication refill. Discussed that patient may take Tylenol or ibuprofen, available over-the-counter according to label instructions as needed for pain.  Final Clinical Impression(s) / ED Diagnoses Final diagnoses:  Right hip pain    Rx / DC Orders ED Discharge Orders     None        Tilden Fossaees, Makyle Eslick, MD 10/31/20 [phone]

## 2020-10-31 NOTE — ED Notes (Addendum)
Pt refused to let me obtain new set of vitals, pt refused temp.

## 2020-10-31 NOTE — Discharge Instructions (Addendum)
You may take tylenol or ibuprofen, available over the counter according to label instructions.

## 2020-10-31 NOTE — ED Notes (Signed)
Pt ambulatory without any assistance. Pt requires no assistance with ambulation.

## 2020-10-31 NOTE — ED Triage Notes (Signed)
Pt arrived to ED via EMS for bilateral hip pain, reports right hip hurts more so than the left.  Pt reports this is chronic pain and has been ongoing for 5 years.  Pt has had right hip replaced in the past and reports his left one needs a replacement.  Pt reports he is homeless.

## 2021-03-07 ENCOUNTER — Emergency Department (HOSPITAL_COMMUNITY): Payer: Medicare Other

## 2021-03-07 ENCOUNTER — Other Ambulatory Visit: Payer: Self-pay

## 2021-03-07 ENCOUNTER — Emergency Department (HOSPITAL_COMMUNITY)
Admission: EM | Admit: 2021-03-07 | Discharge: 2021-03-08 | Disposition: A | Discharge status: Home / Self Care | Payer: Medicare Other | Attending: Emergency Medicine | Admitting: Emergency Medicine

## 2021-03-07 DIAGNOSIS — M79643 Pain in unspecified hand: Secondary | ICD-10-CM | POA: Diagnosis present

## 2021-03-07 DIAGNOSIS — Z96641 Presence of right artificial hip joint: Secondary | ICD-10-CM | POA: Diagnosis not present

## 2021-03-07 DIAGNOSIS — Z79899 Other long term (current) drug therapy: Secondary | ICD-10-CM | POA: Diagnosis not present

## 2021-03-07 DIAGNOSIS — I10 Essential (primary) hypertension: Secondary | ICD-10-CM | POA: Diagnosis not present

## 2021-03-07 DIAGNOSIS — L03011 Cellulitis of right finger: Secondary | ICD-10-CM | POA: Diagnosis not present

## 2021-03-07 MED ORDER — LIDOCAINE HCL (PF) 1 % IJ SOLN
5.0000 mL | Freq: Once | INTRAMUSCULAR | Status: AC
  Administered 2021-03-07: 23:00:00 5 mL
  Filled 2021-03-07: qty 30

## 2021-03-07 MED ORDER — AMOXICILLIN-POT CLAVULANATE 875-125 MG PO TABS: 1.0000 | ORAL_TABLET | Freq: Two times a day (BID) | ORAL | 0 refills | Status: AC

## 2021-03-07 NOTE — Discharge Instructions (Signed)
Advised warm soapy water soaks twice daily for 5 to 10 minutes for the next week.  Advised outpatient follow-up with a primary care doctor within the week.  Advised immediate return for signs of infection redness purulent discharge or worsening pain or any additional concerns.

## 2021-03-07 NOTE — ED Triage Notes (Signed)
Patient reports finger swelling in right middle finger. Denies injury. Finger appears swollen, reddened around nail base. 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 Pain 10/10

## 2021-03-07 NOTE — ED Provider Notes (Signed)
[facility]-EMERGENCY DEPT Provider Note   CSN: [id number] Arrival date & time: 03/07/21  2048     History Chief Complaint  Patient presents with   Hand Pain    Todd [name] is a 50 y.o. male.  Patient presents with pain and swelling to right hand middle finger ulnar aspect of the distal tip.  Symptoms ongoing for 2 days.  Works in [employer] is concerned that he may have happened to it.  However he does not recall any specific event.  No reports of fevers or cough or vomiting or diarrhea.      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   GERD (gastroesophageal reflux disease)    H/O hiatal hernia    Hypertension     Patient Active Problem List   Diagnosis Date Noted   Subarachnoid hemorrhage (HCC) 07/11/2020   Acute respiratory failure with hypoxia (HCC) 05/21/2020   Self-injurious behavior 05/07/2020   Non-suicidal self-harm (HCC) 05/07/2020   Acute stress reaction causing mixed disturbance of emotion and conduct 05/07/2020   Opioid dependence with opioid-induced mood disorder (HCC)    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 05/14/2019   Closed dislocation of right hip (HCC) 04/29/2019   Hypoxia 10/07/2015   Mechanical complication of internal orthopedic device (HCC) 03/16/2015   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   Anemia 07/16/2014   Altered mental status 10/20/2013   Dislocation of hip prosthesis (HCC) 10/20/2013   CAP (community acquired pneumonia) 10/20/2013   Acute GI bleeding 09/19/2013   Acute blood loss anemia 09/19/2013   Essential hypertension 09/19/2013   Sinus tachycardia 09/19/2013   GASTROESOPHAGEAL REFLUX, NO ESOPHAGITIS 05/06/2006    Past Surgical History:  Procedure Laterality Date   APPENDECTOMY     as child   COLONOSCOPY WITH PROPOFOL   02/17/2012   Procedure: COLONOSCOPY WITH PROPOFOL;  Surgeon: Willis Modena, MD;  Location: [facility] ENDOSCOPY;  Service: Endoscopy;  Laterality: N/A;   ESOPHAGOGASTRODUODENOSCOPY Left 09/19/2013   Procedure: ESOPHAGOGASTRODUODENOSCOPY (EGD);  Surgeon: Willis Modena, MD;  Location: [facility] ENDOSCOPY;  Service: Endoscopy;  Laterality: Left;   ESOPHAGOGASTRODUODENOSCOPY (EGD) WITH PROPOFOL  02/17/2012   Procedure: ESOPHAGOGASTRODUODENOSCOPY (EGD) WITH PROPOFOL;  Surgeon: Willis Modena, MD;  Location: [facility] ENDOSCOPY;  Service: Endoscopy;  Laterality: N/A;   HIP ARTHROPLASTY Right    HIP CLOSED REDUCTION Right 10/20/2013   Procedure: CLOSED REDUCTION HIP;  Surgeon: Eldred Manges, MD;  Location: [facility] OR;  Service: Orthopedics;  Laterality: Right;   HIP CLOSED REDUCTION Right 02/18/2015   Procedure: CLOSED REDUCTION HIP;  Surgeon: Jodi Geralds, MD;  Location: [facility] ORS;  Service: Orthopedics;  Laterality: Right;   HIP CLOSED REDUCTION Right 03/16/2015   Procedure: CLOSED REDUCTION HIP;  Surgeon: Kathryne Hitch, MD;  Location: [facility] OR;  Service: Orthopedics;  Laterality: Right;   HIP CLOSED REDUCTION Right 04/29/2019   Procedure: CLOSED MANIPULATION HIP;  Surgeon: Teryl Lucy, MD;  Location: [facility] ORS;  Service: Orthopedics;  Laterality: Right;   HIP CLOSED REDUCTION Right 05/14/2019   Procedure: CLOSED REDUCTION HIP;  Surgeon: Deeann Saint, MD;  Location: [facility] ORS;  Service: Orthopedics;  Laterality: Right;   HIP CLOSED REDUCTION Right 05/22/2019   Procedure: CLOSED MANIPULATION HIP;  Surgeon: Teryl Lucy, MD;  Location: [facility] ORS;  Service: Orthopedics;  Laterality: Right;   JOINT REPLACEMENT  2005  Right Hip       Family History  Problem Relation Age of Onset   Diabetes Mother     Social History   Tobacco Use   Smoking status: Never   Smokeless tobacco: Never  Vaping Use   Vaping Use: Never used  Substance Use Topics   Alcohol use: No   Drug use: Not Currently    Types: IV    Home  Medications Prior to Admission medications   Medication Sig Start Date End Date Taking? Authorizing Provider  albuterol (VENTOLIN HFA) 108 (90 Base) MCG/ACT inhaler Inhale 2 puffs into the lungs every 6 (six) hours as needed for wheezing or shortness of breath. 05/23/20   Amin, Ankit Chirag, MD  albuterol (VENTOLIN HFA) 108 (90 Base) MCG/ACT inhaler INHALE 2 PUFFS INTO THE LUNGS EVERY SIX HOURS AS NEEDED FOR WHEEZING OR SHORTNESS OF BREATH. Patient not taking: Reported on 06/23/2020 05/23/20 05/23/21  Dimple Nanas, MD  amLODipine (NORVASC) 5 MG tablet Take 1 tablet (5 mg total) by mouth daily. 05/23/20   Amin, Ankit Chirag, MD  amLODipine (NORVASC) 5 MG tablet TAKE 1 TABLET (5 MG TOTAL) BY MOUTH DAILY. Patient not taking: Reported on 06/23/2020 05/23/20 05/23/21  Dimple Nanas, MD  amLODipine (NORVASC) 5 MG tablet Take 1 tablet (5 mg total) by mouth daily for 4 days. 06/23/20 06/27/20  Tegeler, Canary Brim, MD  amLODipine (NORVASC) 5 MG tablet Take 1 tablet by mouth daily. 05/23/20   [provider]  dexmethylphenidate (FOCALIN) 10 MG tablet Take 10 mg by mouth 2 (two) times daily. 07/11/20   [provider]  doxycycline (VIBRAMYCIN) 100 MG capsule Take 1 capsule (100 mg total) by mouth 2 (two) times daily. One po bid x 7 days 08/17/20   Arthor Captain, PA-C  FLUoxetine (PROZAC) 20 MG tablet Take 2 tablets (40 mg total) by mouth daily for 4 days. 06/23/20 06/27/20  Tegeler, Canary Brim, MD  FLUoxetine (PROZAC) 40 MG capsule Take 40 mg by mouth daily.    [provider]  FLUoxetine (PROZAC) 40 MG capsule Take 40 mg by mouth daily. 06/09/20   [provider]  gabapentin (NEURONTIN) 100 MG capsule Take 100 mg by mouth at bedtime. 04/03/20   [provider]  gabapentin (NEURONTIN) 100 MG capsule Take 1 capsule (100 mg total) by mouth at bedtime for 4 days. 06/23/20 06/27/20  Tegeler, Canary Brim, MD  gabapentin (NEURONTIN) 100 MG capsule Take 100 mg by mouth  daily. 07/11/20   [provider]  hydrALAZINE (APRESOLINE) 25 MG tablet TAKE 1 TABLET (25 MG TOTAL) BY MOUTH EVERY EIGHT HOURS. Patient taking differently: Take 25 mg by mouth 3 (three) times daily. 05/23/20 05/23/21  Amin, Loura Halt, MD  hydrALAZINE (APRESOLINE) 25 MG tablet Take 1 tablet (25 mg total) by mouth 3 (three) times daily for 4 days. 06/23/20 06/27/20  Tegeler, Canary Brim, MD  hydrALAZINE (APRESOLINE) 25 MG tablet Take 25 mg by mouth every 8 (eight) hours. 05/23/20   [provider]  ibuprofen (ADVIL) 200 MG tablet Take 600 mg by mouth every 6 (six) hours as needed for fever, mild pain or headache.    [provider]  oxyCODONE (ROXICODONE) 15 MG immediate release tablet Take 15 mg by mouth 4 (four) times daily as needed for pain. 07/11/20   [provider]  pantoprazole (PROTONIX) 40 MG tablet Take 40 mg by mouth 2 (two) times daily.  03/22/19   [provider]  pantoprazole (PROTONIX) 40 MG  tablet Take 1 tablet (40 mg total) by mouth 2 (two) times daily for 4 days. 06/23/20 06/27/20  Tegeler, Canary Brim, MD  pantoprazole (PROTONIX) 40 MG tablet Take 1 tablet by mouth 2 (two) times daily. 04/10/20   [provider]  senna-docusate (SENOKOT-S) 8.6-50 MG tablet Take 1 tablet by mouth 2 (two) times daily as needed for moderate constipation. 05/23/20   Amin, Ankit Chirag, MD  senna-docusate (SENOKOT-S) 8.6-50 MG tablet TAKE 1 TABLET BY MOUTH TWO TIMES DAILY AS NEEDED FOR MODERATE CONSTIPATION. Patient not taking: Reported on 06/23/2020 05/23/20 05/23/21  Dimple Nanas, MD  testosterone cypionate (DEPOTESTOSTERONE CYPIONATE) 200 MG/ML injection Inject 200 mg into the muscle every 14 (fourteen) days.  11/11/18   [provider]  tiZANidine (ZANAFLEX) 4 MG tablet TAKE 1 AND 1/2 TABLETS (6 MG TOTAL) BY MOUTH FOUR TIMES DAILY FOR 5 DAYS. Patient not taking: Reported on 06/23/2020 05/23/20 05/23/21  Dimple Nanas, MD  tiZANidine  (ZANAFLEX) 4 MG tablet Take 4 mg by mouth 3 (three) times daily as needed for muscle spasms. 06/30/20   [provider]  triamcinolone ointment (KENALOG) 0.1 % Apply 1 application topically 2 (two) times daily as needed (psoriasis).    [provider]    Allergies    Ambien [zolpidem tartrate], Nu-iron [polysaccharide iron complex], and Fentanyl  Review of Systems   Review of Systems  Constitutional:  Negative for fever.  HENT:  Negative for ear pain and sore throat.   Eyes:  Negative for pain.  Respiratory:  Negative for cough.   Cardiovascular:  Negative for chest pain.  Gastrointestinal:  Negative for abdominal pain.  Genitourinary:  Negative for flank pain.  Musculoskeletal:  Negative for back pain.  Skin:  Negative for color change and rash.  Neurological:  Negative for syncope.  All other systems reviewed and are negative.  Physical Exam Updated Vital Signs BP (!) 146/91 (BP Location: Right Arm)    Pulse (!) 107    Temp 98.6 F (37 C) (Oral)    Resp 19    Ht 6\' 2"  (1.88 m)    SpO2 100%    BMI 23.13 kg/m   Physical Exam Constitutional:      Appearance: He is well-developed.  HENT:     Head: Normocephalic.     Nose: Nose normal.  Eyes:     Extraocular Movements: Extraocular movements intact.  Cardiovascular:     Rate and Rhythm: Normal rate.  Pulmonary:     Effort: Pulmonary effort is normal.  Musculoskeletal:     Comments: Swelling induration and tenderness to the right hand middle finger ulnar aspect nail fold.  Consistent with paronychia.  Skin:    Coloration: Skin is not jaundiced.  Neurological:     Mental Status: He is alert. Mental status is at baseline.    ED Results / Procedures / Treatments   Labs (all labs ordered are listed, but only abnormal results are displayed) Labs Reviewed - No data to display  EKG None  Radiology DG Hand Complete Right  Result Date: 03/07/2021 CLINICAL DATA:  Right third digit swelling and pain EXAM:  RIGHT HAND - COMPLETE 3+ VIEW COMPARISON:  05/04/2010 FINDINGS: Frontal, oblique, lateral views of the right hand are obtained. No acute or destructive bony lesions. Alignment is anatomic. Joint spaces are well preserved. Prominent soft tissue swelling dorsal aspect distal margin third digit. No radiopaque foreign body or subcutaneous gas. IMPRESSION: 1. Soft tissue swelling distal aspect third digit. 2. No  fracture or radiopaque foreign body. Electronically Signed   By: Sharlet Salina M.D.   On: 03/07/2021 22:15    Procedures .Marland KitchenIncision and Drainage  Date/Time: 03/07/2021 11:48 PM Performed by: Cheryll Cockayne, MD Authorized by: Cheryll Cockayne, MD   Comments:     Digital block performed with lidocaine 1% epinephrine total of 8 cc instilled for on the ulnar aspect of for on the radial aspect and 1 across the top.  Appropriate anesthesia achieved.  11 blade used to make a small incision along the middle finger ulnar nail fold.  Approximately 0.5 cc of purulent material expressed.  Patient tolerated procedure well. Celedonio Miyamoto Block  Date/Time: 03/07/2021 11:48 PM Performed by: Cheryll Cockayne, MD Authorized by: Cheryll Cockayne, MD   Universal protocol:    Patient identity confirmed:  Verbally with patient Indications:    Indications:  Pain relief and procedural anesthesia Location:    Body area:  Upper extremity   Laterality:  Right Pre-procedure details:    Skin preparation:  Alcohol Comments:     Digital block performed with lidocaine 1% epinephrine total of 8 cc instilled for on the ulnar aspect of for on the radial aspect and 1 across the top.  Appropriate anesthesia achieved.   Medications Ordered in ED Medications  lidocaine (PF) (XYLOCAINE) 1 % injection 5 mL (5 mLs Infiltration Given 03/07/21 2305)    ED Course  I have reviewed the triage vital signs and the nursing notes.  Pertinent labs & imaging results that were available during my care of the patient were reviewed by me and  considered in my medical decision making (see chart for details).    MDM Rules/Calculators/A&P                         Digital block performed with lidocaine 1% epinephrine total of 8 cc instilled for on the ulnar aspect of for on the radial aspect and 1 across the top.  Appropriate anesthesia achieved.  11 blade used to make a small incision along the middle finger ulnar nail fold.  Approximately 0.5 cc of purulent material expressed.  Patient tolerated procedure well.     Final Clinical Impression(s) / ED Diagnoses Final diagnoses:  Paronychia of finger of right hand    Rx / DC Orders ED Discharge Orders     None        Cheryll Cockayne, MD 03/07/21 [phone]

## 2021-03-07 NOTE — ED Provider Notes (Signed)
Emergency Medicine Provider Triage Evaluation Note  Todd [name] , a 50 y.o. male  was evaluated in triage.  Pt complains of finger pain.  Patient states over the past few days he has noticed some redness and swelling to the distal right middle finger.  He states that it has been red around the nail base and his pain is severe, 10 out of 10 and sharp.  He denies any fevers.  He does do [employer] for his work and is unknown if he had any injuries.  Review of Systems  Positive: See above          Negative:   Physical Exam  BP (!) 146/91 (BP Location: Right Arm)    Pulse (!) 107    Temp 98.6 F (37 C) (Oral)    Resp 19    Ht 6\' 2"  (1.88 m)    SpO2 100%    BMI 23.13 kg/m  Gen:   Awake, no distress                                   Resp:  Normal effort  MSK:   Moves extremities without difficulty  Other:  Right middle finger with paronychia  Medical Decision Making  Medically screening exam initiated at 9:46 PM.  Appropriate orders placed.  was informed that the remainder of the evaluation will be completed by another provider, this initial triage assessment does not replace that evaluation, and the importance of remaining in the ED until their evaluation is complete.     [name], PA-C 03/07/21 2147    2148, MD 03/07/21 [phone]

## 2021-03-28 ENCOUNTER — Emergency Department (HOSPITAL_COMMUNITY)
Admission: EM | Admit: 2021-03-28 | Discharge: 2021-03-29 | Disposition: A | Discharge status: Home / Self Care | Payer: Medicare Other | Attending: Emergency Medicine | Admitting: Emergency Medicine

## 2021-03-28 ENCOUNTER — Encounter (HOSPITAL_COMMUNITY): Payer: Self-pay | Admitting: Emergency Medicine

## 2021-03-28 DIAGNOSIS — M25552 Pain in left hip: Secondary | ICD-10-CM | POA: Insufficient documentation

## 2021-03-28 DIAGNOSIS — G8929 Other chronic pain: Secondary | ICD-10-CM | POA: Insufficient documentation
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
## 2021-03-28 NOTE — ED Provider Triage Note (Signed)
Emergency Medicine Provider Triage Evaluation Note  EON BRAKE , a 51 y.o. male  was evaluated in triage.  Pt complains of hip pain.  Pt reports he needs a hip replacement  Review of Systems  Positive: Left hip pain  Negative: injury  Physical Exam  BP (!) 144/132 (BP Location: Left Arm)    Pulse 100    Temp 98.3 F (36.8 C) (Oral)    Resp 16    Ht 6\' 2"  (1.88 m)    Wt 82 kg    SpO2 99%    BMI 23.21 kg/m  Gen:   Awake, no distress   Resp:  Normal effort  MSK:   Pt report unable to tolerate pain today, pain with walking Other:    Medical Decision Making  Medically screening exam initiated at 3:59 PM.  Appropriate orders placed.  Candee Furbish was informed that the remainder of the evaluation will be completed by another provider, this initial triage assessment does not replace that evaluation, and the importance of remaining in the ED until their evaluation is complete.     Fransico Meadow, [place] 03/28/21 1601

## 2021-03-29 ENCOUNTER — Emergency Department (HOSPITAL_COMMUNITY): Payer: Medicare Other

## 2021-03-29 MED ORDER — KETOROLAC TROMETHAMINE 30 MG/ML IJ SOLN
30.0000 mg | Freq: Once | INTRAMUSCULAR | Status: AC
  Administered 2021-03-29: 30 mg via INTRAMUSCULAR
  Filled 2021-03-29: qty 1

## 2021-03-29 NOTE — ED Provider Notes (Signed)
[facility] EMERGENCY DEPARTMENT Provider Note   CSN: [id number] Arrival date & time: 03/28/21  1536     History  Chief Complaint  Patient presents with   Hip Pain    OSA CAMPOLI is a 51 y.o. male.  The history is provided by the patient.  Hip Pain This is a chronic problem. The current episode started more than 1 week ago. The problem occurs daily. The problem has been gradually worsening. The symptoms are aggravated by walking. The symptoms are relieved by rest.  Patient with long history of bilateral hip pain.  He reports he has had his right hip replaced and had multiple revisions.  He reports has had ongoing left hip pain.  No trauma.  He reports it hurts to walk.   No previous left hip surgery.  He denies IVDA No new back pain Home Medications Prior to Admission medications   Medication Sig Start Date End Date Taking? Authorizing Provider  albuterol (VENTOLIN HFA) 108 (90 Base) MCG/ACT inhaler Inhale 2 puffs into the lungs every 6 (six) hours as needed for wheezing or shortness of breath. 05/23/20   Amin, Ankit Chirag, MD  albuterol (VENTOLIN HFA) 108 (90 Base) MCG/ACT inhaler INHALE 2 PUFFS INTO THE LUNGS EVERY SIX HOURS AS NEEDED FOR WHEEZING OR SHORTNESS OF BREATH. Patient not taking: Reported on 06/23/2020 05/23/20 05/23/21  Dimple Nanas, MD  amLODipine (NORVASC) 5 MG tablet Take 1 tablet (5 mg total) by mouth daily. 05/23/20   Amin, Ankit Chirag, MD  amLODipine (NORVASC) 5 MG tablet TAKE 1 TABLET (5 MG TOTAL) BY MOUTH DAILY. Patient not taking: Reported on 06/23/2020 05/23/20 05/23/21  Dimple Nanas, MD  amLODipine (NORVASC) 5 MG tablet Take 1 tablet (5 mg total) by mouth daily for 4 days. 06/23/20 06/27/20  Tegeler, Canary Brim, MD  amLODipine (NORVASC) 5 MG tablet Take 1 tablet by mouth daily. 05/23/20   [provider]  amoxicillin-clavulanate (AUGMENTIN) 875-125 MG tablet Take 1 tablet by mouth every 12 (twelve) hours. 03/07/21   Cheryll Cockayne, MD  dexmethylphenidate (FOCALIN) 10 MG tablet Take 10 mg by mouth 2 (two) times daily. 07/11/20   [provider]  FLUoxetine (PROZAC) 20 MG tablet Take 2 tablets (40 mg total) by mouth daily for 4 days. 06/23/20 06/27/20  Tegeler, Canary Brim, MD  FLUoxetine (PROZAC) 40 MG capsule Take 40 mg by mouth daily.    [provider]  FLUoxetine (PROZAC) 40 MG capsule Take 40 mg by mouth daily. 06/09/20   [provider]  gabapentin (NEURONTIN) 100 MG capsule Take 100 mg by mouth at bedtime. 04/03/20   [provider]  gabapentin (NEURONTIN) 100 MG capsule Take 1 capsule (100 mg total) by mouth at bedtime for 4 days. 06/23/20 06/27/20  Tegeler, Canary Brim, MD  gabapentin (NEURONTIN) 100 MG capsule Take 100 mg by mouth daily. 07/11/20   [provider]  hydrALAZINE (APRESOLINE) 25 MG tablet TAKE 1 TABLET (25 MG TOTAL) BY MOUTH EVERY EIGHT HOURS. Patient taking differently: Take 25 mg by mouth 3 (three) times daily. 05/23/20 05/23/21  Amin, Loura Halt, MD  hydrALAZINE (APRESOLINE) 25 MG tablet Take 1 tablet (25 mg total) by mouth 3 (three) times daily for 4 days. 06/23/20 06/27/20  Tegeler, Canary Brim, MD  hydrALAZINE (APRESOLINE) 25 MG tablet Take 25 mg by mouth every 8 (eight) hours. 05/23/20   [provider]  ibuprofen (ADVIL) 200 MG tablet Take 600 mg by mouth every 6 (six)  hours as needed for fever, mild pain or headache.    [provider]  oxyCODONE (ROXICODONE) 15 MG immediate release tablet Take 15 mg by mouth 4 (four) times daily as needed for pain. 07/11/20   [provider]  pantoprazole (PROTONIX) 40 MG tablet Take 40 mg by mouth 2 (two) times daily.  03/22/19   [provider]  pantoprazole (PROTONIX) 40 MG tablet Take 1 tablet (40 mg total) by mouth 2 (two) times daily for 4 days. 06/23/20 06/27/20  Tegeler, Canary Brimhristopher J, MD  pantoprazole (PROTONIX) 40 MG tablet Take 1 tablet by mouth 2 (two) times daily. 04/10/20    [provider]  senna-docusate (SENOKOT-S) 8.6-50 MG tablet Take 1 tablet by mouth 2 (two) times daily as needed for moderate constipation. 05/23/20   Amin, Ankit Chirag, MD  senna-docusate (SENOKOT-S) 8.6-50 MG tablet TAKE 1 TABLET BY MOUTH TWO TIMES DAILY AS NEEDED FOR MODERATE CONSTIPATION. Patient not taking: Reported on 06/23/2020 05/23/20 05/23/21  Dimple NanasAmin, Ankit Chirag, MD  testosterone cypionate (DEPOTESTOSTERONE CYPIONATE) 200 MG/ML injection Inject 200 mg into the muscle every 14 (fourteen) days.  11/11/18   [provider]  tiZANidine (ZANAFLEX) 4 MG tablet TAKE 1 AND 1/2 TABLETS (6 MG TOTAL) BY MOUTH FOUR TIMES DAILY FOR 5 DAYS. Patient not taking: Reported on 06/23/2020 05/23/20 05/23/21  Dimple NanasAmin, Ankit Chirag, MD  tiZANidine (ZANAFLEX) 4 MG tablet Take 4 mg by mouth 3 (three) times daily as needed for muscle spasms. 06/30/20   [provider]  triamcinolone ointment (KENALOG) 0.1 % Apply 1 application topically 2 (two) times daily as needed (psoriasis).    [provider]      Allergies    Ambien [zolpidem tartrate], Nu-iron [polysaccharide iron complex], and Fentanyl    Review of Systems   Review of Systems  Constitutional:  Negative for fever.  Musculoskeletal:  Positive for arthralgias.   Physical Exam Updated Vital Signs BP (!) 163/104 (BP Location: Right Arm)    Pulse 76    Temp 98.3 F (36.8 C) (Oral)    Resp 20    Ht 1.88 m (6\' 2" )    Wt 82 kg    SpO2 99%    BMI 23.21 kg/m  Physical Exam CONSTITUTIONAL: Disheveled, lying on his right side HEAD: Normocephalic/atraumatic EYES: EOMI/PERRL ENMT: Mucous membranes moist, poor dentition NECK: supple no meningeal signs SPINE/BACK:entire spine nontender ABDOMEN: soft, nontender GU:no cva tenderness NEURO: Pt is awake/alert/appropriate, moves all extremitiesx4.  No facial droop.   EXTREMITIES: pulses normal/equal, full ROM Tenderness with range of motion of left hip. No deformities.  No warmth or  erythema noted of the left hip.  Distal pulses equal and intact SKIN: warm, color normal PSYCH: Anxious  ED Results / Procedures / Treatments   Labs (all labs ordered are listed, but only abnormal results are displayed) Labs Reviewed - No data to display  EKG None  Radiology DG Hip Unilat W or Wo Pelvis 2-3 Views Left  Result Date: 03/29/2021 CLINICAL DATA:  Left hip pain EXAM: DG HIP (WITH OR WITHOUT PELVIS) 2-3V LEFT COMPARISON:  06/23/2020 FINDINGS: Normal alignment. No acute fracture or dislocation. 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 No evidence of a articular collapse. Left hip joint space is preserved. Right total hip arthroplasty has been performed. Soft tissues are unremarkable. IMPRESSION: Left hip AVN. No evidence of articular collapse. Preserved left hip joint space. Electronically Signed   By: Helyn NumbersAshesh  Parikh  M.D.   On: 03/29/2021 01:46    Procedures Procedures    Medications Ordered in ED Medications  ketorolac (TORADOL) 30 MG/ML injection 30 mg (30 mg Intramuscular Given 03/29/21 0122)    ED Course/ Medical Decision Making/ A&P                           Medical Decision Making Amount and/or Complexity of Data Reviewed Radiology: ordered.  Risk Prescription drug management.   Patient presented with ongoing chronic pain of his hips.  He has had previous surgery to his right hip and now reported left hip pain  Patient has been seen multiple times by outpatient providers including pain management as well as orthopedics He was resting comfortably ER.  X-ray was reviewed and shows chronic AVN but no acute finding There is no indication for admission or further ongoing ER management.  When I discussed discharge, patient became very agitated and started yelling.  He demanded pain medications and to be admitted.  He brought up previous pain management visits and reports he was lied to and they stopped all  of his pain medications.  Advised patient he can follow-up with orthopedics as an outpatient        Final Clinical Impression(s) / ED Diagnoses Final diagnoses:  Chronic left hip pain    Rx / DC Orders ED Discharge Orders     None         Zadie Rhine, MD 03/29/21 [phone]
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
## 2021-03-29 NOTE — ED Notes (Signed)
Patient transported to X-ray 

## 2021-05-24 ENCOUNTER — Encounter (HOSPITAL_COMMUNITY): Payer: Self-pay | Admitting: *Deleted

## 2021-05-24 ENCOUNTER — Other Ambulatory Visit: Payer: Self-pay

## 2021-05-24 ENCOUNTER — Emergency Department (HOSPITAL_COMMUNITY)
Admission: EM | Admit: 2021-05-24 | Discharge: 2021-05-25 | Disposition: A | Discharge status: Home / Self Care | Payer: Medicare (Managed Care) | Attending: Emergency Medicine | Admitting: Emergency Medicine

## 2021-05-24 DIAGNOSIS — R7989 Other specified abnormal findings of blood chemistry: Secondary | ICD-10-CM | POA: Diagnosis not present

## 2021-05-24 DIAGNOSIS — Z0279 Encounter for issue of other medical certificate: Secondary | ICD-10-CM | POA: Diagnosis not present

## 2021-05-24 DIAGNOSIS — F1410 Cocaine abuse, uncomplicated: Secondary | ICD-10-CM | POA: Diagnosis present

## 2021-05-24 DIAGNOSIS — R4182 Altered mental status, unspecified: Secondary | ICD-10-CM | POA: Diagnosis not present

## 2021-05-24 DIAGNOSIS — F29 Unspecified psychosis not due to a substance or known physiological condition: Secondary | ICD-10-CM | POA: Diagnosis present

## 2021-05-24 DIAGNOSIS — F1999 Other psychoactive substance use, unspecified with unspecified psychoactive substance-induced disorder: Secondary | ICD-10-CM | POA: Diagnosis present

## 2021-05-24 LAB — COMPREHENSIVE METABOLIC PANEL
ALT: 18 U/L (ref 0–44)
AST: 16 U/L (ref 15–41)
Albumin: 4.4 g/dL (ref 3.5–5.0)
Alkaline Phosphatase: 90 U/L (ref 38–126)
Anion gap: 12 (ref 5–15)
BUN: 24 mg/dL — ABNORMAL HIGH (ref 6–20)
CO2: 22 mmol/L (ref 22–32)
Calcium: 9.2 mg/dL (ref 8.9–10.3)
Chloride: 104 mmol/L (ref 98–111)
Creatinine, Ser: 1.28 mg/dL — ABNORMAL HIGH (ref 0.61–1.24)
GFR, Estimated: >60 mL/min (ref >60)
Glucose, Bld: 136 mg/dL — ABNORMAL HIGH (ref 70–99)
Potassium: 3.7 mmol/L (ref 3.5–5.1)
Sodium: 138 mmol/L (ref 135–145)
Total Bilirubin: 1.1 mg/dL (ref 0.3–1.2)
Total Protein: 8 g/dL (ref 6.5–8.1)

## 2021-05-24 LAB — CBC
HCT: 50.1 % (ref 39.0–52.0)
Hemoglobin: 16.6 g/dL (ref 13.0–17.0)
MCH: 28.7 pg (ref 26.0–34.0)
MCHC: 33.1 g/dL (ref 30.0–36.0)
MCV: 86.5 fL (ref 80.0–100.0)
Platelets: 483 10*3/uL — ABNORMAL HIGH (ref 150–400)
RBC: 5.79 MIL/uL (ref 4.22–5.81)
RDW: 14.8 % (ref 11.5–15.5)
WBC: 13.4 10*3/uL — ABNORMAL HIGH (ref 4.0–10.5)
nRBC: 0 % (ref 0.0–0.2)

## 2021-05-24 LAB — RAPID URINE DRUG SCREEN, HOSP PERFORMED
Amphetamines: NOT DETECTED
Barbiturates: NOT DETECTED
Benzodiazepines: NOT DETECTED
Cocaine: POSITIVE — AB
Opiates: NOT DETECTED
Tetrahydrocannabinol: NOT DETECTED

## 2021-05-24 LAB — ETHANOL: Alcohol, Ethyl (B): <10 mg/dL (ref <10)

## 2021-05-24 LAB — ACETAMINOPHEN LEVEL: Acetaminophen (Tylenol), Serum: <10 ug/mL — ABNORMAL LOW (ref 10–30)

## 2021-05-24 LAB — SALICYLATE LEVEL: Salicylate Lvl: <7 mg/dL — ABNORMAL LOW (ref 7.0–30.0)

## 2021-05-24 MED ORDER — OLANZAPINE 10 MG PO TBDP: 10.0000 mg | ORAL_TABLET | Freq: Three times a day (TID) | ORAL | Status: DC | PRN

## 2021-05-24 MED ORDER — LORAZEPAM 1 MG PO TABS
1.0000 mg | ORAL_TABLET | ORAL | Status: DC | PRN
  Filled 2021-05-24: qty 1

## 2021-05-24 MED ORDER — ZIPRASIDONE MESYLATE 20 MG IM SOLR: 20.0000 mg | INTRAMUSCULAR | Status: DC | PRN

## 2021-05-24 MED ORDER — OLANZAPINE 10 MG PO TBDP
10.0000 mg | ORAL_TABLET | Freq: Every day | ORAL | Status: DC
  Administered 2021-05-24 – 2021-05-25 (×2): 10 mg via ORAL
  Filled 2021-05-24 (×2): qty 1

## 2021-05-24 MED ORDER — LORAZEPAM 2 MG/ML IJ SOLN
2.0000 mg | Freq: Once | INTRAMUSCULAR | Status: AC
  Administered 2021-05-24: 2 mg via INTRAMUSCULAR
  Filled 2021-05-24: qty 1

## 2021-05-24 MED ORDER — GABAPENTIN 300 MG PO CAPS
300.0000 mg | ORAL_CAPSULE | Freq: Three times a day (TID) | ORAL | Status: DC
  Administered 2021-05-24 – 2021-05-25 (×2): 300 mg via ORAL
  Filled 2021-05-24 (×2): qty 1

## 2021-05-24 MED ORDER — LORAZEPAM 2 MG/ML IJ SOLN
1.0000 mg | Freq: Once | INTRAMUSCULAR | Status: AC
  Administered 2021-05-24: 1 mg via INTRAVENOUS
  Filled 2021-05-24: qty 1
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
## 2021-05-24 NOTE — ED Notes (Signed)
Pt resting comfortably at this time.

## 2021-05-24 NOTE — ED Notes (Signed)
Pt. Belongings are: Belongings are in cabinet 16-18 RES A ? ?1-black belt ?1-pair of black socks ?1-pair of black slides ?1-black wallet ?1-silver cross necklace ?3-silver rings ?1-nasal spray ?1-black underwear ?1-grey joggers ?1-blue jeans ?1-grey pullover hoodie ?

## 2021-05-24 NOTE — ED Notes (Signed)
Patient resting quietly in bed at this time.  Eyes closed.  Respirations even and unlabored.  ?

## 2021-05-24 NOTE — ED Notes (Signed)
Restraints not needed at this time.  Patient was cooperative with medication administration.  Restraints not applied.  Order d/c ?

## 2021-05-24 NOTE — BH Assessment (Signed)
Comprehensive Clinical Assessment (CCA) Note ? ?05/24/2021 ?Todd [name] ?[id number] ? ?Chief Complaint:  ?Chief Complaint  ?Patient presents with  ? Medical Clearance  ? ?Visit Diagnosis:  ?Unspecified Psychosis ? ?Disposition: ?Per Maxie Barb NP pt meets inpatient criteria.  Informed RN and EDP.   ? ?Flowsheet Row ED from 05/24/2021 in [facility] from 03/28/2021 in [facility] EMERGENCY DEPARTMENT ED from 03/07/2021 in [facility]  ?C-SSRS RISK CATEGORY No Risk No Risk No Risk  ? ?  ? ? ?The patient demonstrates the following risk factors for suicide: Chronic risk factors for suicide include: substance use disorder. Acute risk factors for suicide include: social withdrawal/isolation. Protective factors for this patient include: religious beliefs against suicide. Considering these factors, the overall suicide risk at this point appears to be low. Patient is not appropriate for outpatient follow up. ?  ?Todd [name] is a 51 yo male reporting to [facility] for evaluation of altered mental status. Pt states that he lives in a shed with his dog currently "but my dog left me too".  Pt is presenting with a very irritable affect, and is talking about [employer]. Pt states that Jesus and Jesus's son (meaning the Son of God Jesus and Jesus's biological son) is on [employer] and pt is being directed by the messages.Pt at one point told clinician that he would stop talking to her unless his information is being put into FB messenger. Assured pt tht FB messenger is not HIPPA compliant so all communication would have to be done through our secure internal network. Pt states that he died last night and today is preoccupied with the "Jesus" messages.  Pt also fixated several times throughout assessment on numbers and number patterns. Pt feels that there is a spiritual connection with all the numbers that he is seeing everywhere, even  in the hospital. Pt states that his RN is administering Adderall to him.  Pt denies ANY history of SI (historical review of record contradicts this).  Pt denies HI or AVH.  Pt denies current substance use. ? ?Pt denies any psychiatric meds or tx. ? ?CCA Screening, Triage and Referral (STR) ? ?Patient Reported Information ?How did you hear about [place]? No data recorded ?What Is the Reason for Your Visit/Call Today? Todd [name] is a 51 yo male reporting to [facility] for evaluation of altered mental status. Pt states that he lives in a shed with his dog currently "but my dog left me too".  Pt is presenting with a very irritable affect, and is talking about [employer]. Pt states that Jesus and Jesus's son (meaning the Son of God Jesus and Jesus's biological son) is on [employer] and pt is being directed by the messages.Pt at one point told clinician that he would stop talking to her unless his information is being put into FB messenger. Assured pt tht FB messenger is not HIPPA compliant so all communication would have to be done through our secure internal network. Pt states that he died last night and today is preoccupied with the "Jesus" messages.  Pt also fixated several times throughout assessment on numbers and number patterns. Pt feels that there is a spiritual connection with all the numbers that he is seeing everywhere, even in the hospital. Pt states that his RN is administering Adderall to him.  Pt denies ANY history of SI (historical review of record contradicts this).  Pt denies HI or AVH.  Pt denies current  substance use. ? ?How Long Has This Been Causing You Problems? <Week ? ?What Do You Feel Would Help You the Most Today? Treatment for Depression or other mood problem ? ? ?Have You Recently Had Any Thoughts About Hurting Yourself? No ? ?Are You Planning to Commit Suicide/Harm Yourself At This time? No ? ? ?Have you Recently Had Thoughts About Hurting Someone Karolee Ohslse? No ? ?Are You Planning to Harm Someone  at This Time? No ? ?Explanation: No data recorded ? ?Have You Used Any Alcohol or Drugs in the Past 24 Hours? No (pt denies) ? ?How Long Ago Did You Use Drugs or Alcohol? No data recorded ?What Did You Use and How Much? No data recorded ? ?Do You Currently Have a Therapist/Psychiatrist? No (pt denies) ? ?Name of Therapist/Psychiatrist: No data recorded ? ?Have You Been Recently Discharged From Any Office Practice or Programs? No ? ?Explanation of Discharge From Practice/Program: No data recorded ? ?  ?CCA Screening Triage Referral Assessment ?Type of Contact: Tele-Assessment ? ?Telemedicine Service Delivery: Telemedicine service delivery: This service was provided via telemedicine using a 2-way, interactive audio and video technology ? ?Is this Initial or Reassessment? Initial Assessment ? ?Date Telepsych consult ordered in [facility]:  05/24/21 ? ?Time Telepsych consult ordered in [facility]:  1214 ? ?Location of Assessment: [facility] ? ?[facility] ? ? ?Collateral Involvement: none ? ? ?Does Patient Have a Automotive engineerCourt Appointed Legal Guardian? No data recorded ?Name and Contact of Legal Guardian: No data recorded ?If Minor and Not Living with Parent(s), Who has Custody? No data recorded ?Is CPS involved or ever been involved? Never ? ?Is APS involved or ever been involved? Never ? ? ?Patient Determined To Be At Risk for Harm To Self or Others Based on Review of Patient Reported Information or Presenting Complaint? No ? ?Method: No data recorded ?Availability of Means: No data recorded ?Intent: No data recorded ?Notification Required: No data recorded ?Additional Information for Danger to Others Potential: No data recorded ?Additional Comments for Danger to Others Potential: No data recorded ?Are There Guns or Other Weapons in Your Home? No data recorded ?Types of Guns/Weapons: No data recorded ?Are These Weapons Safely Secured?                            No data recorded ?Who Could Verify You Are Able To  Have These Secured: No data recorded ?Do You Have any Outstanding Charges, Pending Court Dates, Parole/Probation? No data recorded ?Contacted To Inform of Risk of Harm To Self or Others: -- (na) ? ? ? ?Does Patient Present under Involuntary Commitment? No ? ?IVC Papers Initial File Date: No data recorded ? [place] ? ? ?Patient Currently Receiving the Following Services: Not Receiving Services ? ? ?Determination of Need: Emergent (2 hours) ? ? ?Options For Referral: Inpatient Hospitalization ? ? ? ? ?CCA Biopsychosocial ?Patient Reported Schizophrenia/Schizoaffective Diagnosis in Past: No ? ? ?Strengths: uta ? ? ?Mental Health Symptoms ?Depression:  None ?  ?Duration of Depressive symptoms:    ?Mania:  Recklessness; Racing thoughts; Increased Energy ?  ?Anxiety:   Irritability; Tension ?  ?Psychosis:  Delusions (pt fixated that he has died/come back, thinks Jesus is on FB messenger, and that there are patterns in numbers) ?  ?Duration of Psychotic symptoms: Duration of Psychotic Symptoms: Greater than six months ?  ?Trauma:  None ?  ?Obsessions:  Poor insight ?  ?Compulsions:  Absent insight/delusional ?  ?Inattention:  None ?  ?Hyperactivity/Impulsivity:  None ?  ?Oppositional/Defiant Behaviors:  Argumentative; Aggression towards people/animals ?  ?Emotional Irregularity:  Mood lability ?  ?Other Mood/Personality Symptoms:  No data recorded  ? ?Mental Status Exam ?Appearance and self-care  ?Stature:  Average ?  ?Weight:  Average weight ?  ?Clothing:  Neat/clean ?  ?Grooming:  Normal ?  ?Cosmetic use:  None ?  ?Posture/gait:  Normal ?  ?Motor activity:  Not Remarkable ?  ?Sensorium  ?Attention:  Confused; Vigilant; Persistent ?  ?Concentration:  Preoccupied ?  ?Orientation:  Place; Person; Situation; Object ?  ?Recall/memory:  Defective in Immediate ?  ?Affect and Mood  ?Affect:  Anxious; Labile ?  ?Mood:  Anxious; Irritable ?  ?Relating  ?Eye contact:  Fleeting ?  ?Facial expression:  Anxious;  Tense ?  ?Attitude toward examiner:  Irritable; Defensive ?  ?Thought and Language  ?Speech flow: Pressured; Loud ?  ?Thought content:  Delusions; Suspicious ?  ?Preoccupation:  Ruminations (death,

## 2021-05-24 NOTE — ED Notes (Signed)
Patient awake and asking about plan of care.  Reports "I have been here since this morning and I was told I needed to wait to talk to a psychologist.  I did that and I am still here."  Patient informed that MD and Psychologist are discussing care and he would be updated  ?

## 2021-05-24 NOTE — ED Notes (Signed)
Security remained at bedside w/ pt.  Pt agreeing to give over phone to security.  Pt's cell phone placed back into bag of belongings in cupboard.  Pt awaiting TTS eval ?

## 2021-05-24 NOTE — ED Provider Notes (Signed)
?[facility]-EMERGENCY DEPT ?Provider Note ? ? ?CSN: [id number] ?Arrival date & time: 05/24/21  1021 ? ?  ? ?History ? ?Chief Complaint  ?Patient presents with  ? Medical Clearance  ? ? ?Todd [name] is a 51 y.o. male presenting with altered mental status.  He was reportedly dropped off by a friend.  He denies drug or alcohol use.  Denies AVH.  Says that he is here because he "died."  He is rambling about seeing multiple complaints and multiple people all around.  Says that he does not take any medications for psychiatric conditions. ? ?Home Medications ?Prior to Admission medications   ?Medication Sig Start Date End Date Taking? Authorizing Provider  ?albuterol (VENTOLIN HFA) 108 (90 Base) MCG/ACT inhaler Inhale 2 puffs into the lungs every 6 (six) hours as needed for wheezing or shortness of breath. 05/23/20   Amin, Loura HaltAnkit Chirag, MD  ?albuterol (VENTOLIN HFA) 108 (90 Base) MCG/ACT inhaler INHALE 2 PUFFS INTO THE LUNGS EVERY SIX HOURS AS NEEDED FOR WHEEZING OR SHORTNESS OF BREATH. ?Patient not taking: Reported on 06/23/2020 05/23/20 05/23/21  Dimple NanasAmin, Ankit Chirag, MD  ?amLODipine (NORVASC) 5 MG tablet Take 1 tablet (5 mg total) by mouth daily. 05/23/20   Amin, Loura HaltAnkit Chirag, MD  ?amLODipine (NORVASC) 5 MG tablet TAKE 1 TABLET (5 MG TOTAL) BY MOUTH DAILY. ?Patient not taking: Reported on 06/23/2020 05/23/20 05/23/21  Dimple NanasAmin, Ankit Chirag, MD  ?amLODipine (NORVASC) 5 MG tablet Take 1 tablet (5 mg total) by mouth daily for 4 days. 06/23/20 06/27/20  Tegeler, Canary Brimhristopher J, MD  ?amLODipine (NORVASC) 5 MG tablet Take 1 tablet by mouth daily. 05/23/20   [provider]  ?amoxicillin-clavulanate (AUGMENTIN) 875-125 MG tablet Take 1 tablet by mouth every 12 (twelve) hours. 03/07/21   Cheryll CockayneHong, Joshua S, MD  ?dexmethylphenidate (FOCALIN) 10 MG tablet Take 10 mg by mouth 2 (two) times daily. 07/11/20   [provider]  ?FLUoxetine (PROZAC) 20 MG tablet Take 2 tablets (40 mg total) by mouth daily for 4 days.  06/23/20 06/27/20  Tegeler, Canary Brimhristopher J, MD  ?FLUoxetine (PROZAC) 40 MG capsule Take 40 mg by mouth daily.    [provider]  ?FLUoxetine (PROZAC) 40 MG capsule Take 40 mg by mouth daily. 06/09/20   [provider]  ?gabapentin (NEURONTIN) 100 MG capsule Take 100 mg by mouth at bedtime. 04/03/20   [provider]  ?gabapentin (NEURONTIN) 100 MG capsule Take 1 capsule (100 mg total) by mouth at bedtime for 4 days. 06/23/20 06/27/20  Tegeler, Canary Brimhristopher J, MD  ?gabapentin (NEURONTIN) 100 MG capsule Take 100 mg by mouth daily. 07/11/20   [provider]  ?hydrALAZINE (APRESOLINE) 25 MG tablet TAKE 1 TABLET (25 MG TOTAL) BY MOUTH EVERY EIGHT HOURS. ?Patient taking differently: Take 25 mg by mouth 3 (three) times daily. 05/23/20 05/23/21  Dimple NanasAmin, Ankit Chirag, MD  ?hydrALAZINE (APRESOLINE) 25 MG tablet Take 1 tablet (25 mg total) by mouth 3 (three) times daily for 4 days. 06/23/20 06/27/20  Tegeler, Canary Brimhristopher J, MD  ?hydrALAZINE (APRESOLINE) 25 MG tablet Take 25 mg by mouth every 8 (eight) hours. 05/23/20   [provider]  ?ibuprofen (ADVIL) 200 MG tablet Take 600 mg by mouth every 6 (six) hours as needed for fever, mild pain or headache.    [provider]  ?oxyCODONE (ROXICODONE) 15 MG immediate release tablet Take 15 mg by mouth 4 (four) times daily as needed for pain. 07/11/20   [provider]  ?pantoprazole (PROTONIX) 40  MG tablet Take 40 mg by mouth 2 (two) times daily.  03/22/19   [provider]  ?pantoprazole (PROTONIX) 40 MG tablet Take 1 tablet (40 mg total) by mouth 2 (two) times daily for 4 days. 06/23/20 06/27/20  Tegeler, Canary Brim, MD  ?pantoprazole (PROTONIX) 40 MG tablet Take 1 tablet by mouth 2 (two) times daily. 04/10/20   [provider]  ?senna-docusate (SENOKOT-S) 8.6-50 MG tablet Take 1 tablet by mouth 2 (two) times daily as needed for moderate constipation. 05/23/20   Amin, Loura Halt, MD  ?testosterone cypionate  (DEPOTESTOSTERONE CYPIONATE) 200 MG/ML injection Inject 200 mg into the muscle every 14 (fourteen) days.  11/11/18   [provider]  ?tiZANidine (ZANAFLEX) 4 MG tablet Take 4 mg by mouth 3 (three) times daily as needed for muscle spasms. 06/30/20   [provider]  ?triamcinolone ointment (KENALOG) 0.1 % Apply 1 application topically 2 (two) times daily as needed (psoriasis).    [provider]  ?   ? ?Allergies    ?Ambien [zolpidem tartrate], Nu-iron [polysaccharide iron complex], and Fentanyl   ? ?Review of Systems   ?Review of Systems ?Level 5 caveat due to AMS ? ?Physical Exam ?Updated Vital Signs ?BP 129/84 (BP Location: Right Arm)   Pulse (!) 118   Temp 99 ?F (37.2 ?C) (Oral)   Resp 17   Ht 6\' 2"  (1.88 m)   Wt 68 kg   SpO2 94%   BMI 19.26 kg/m?  ?Physical Exam ?Vitals and nursing note reviewed.  ?Constitutional:   ?   Appearance: Normal appearance.  ?HENT:  ?   Head: Normocephalic and atraumatic.  ?Eyes:  ?   General: No scleral icterus. ?   Conjunctiva/sclera: Conjunctivae normal.  ?Pulmonary:  ?   Effort: Pulmonary effort is normal. No respiratory distress.  ?Skin: ?   Findings: No rash.  ?Neurological:  ?   Mental Status: He is alert.  ?Psychiatric:     ?   Mood and Affect: Mood normal.  ? ? ?ED Results / Procedures / Treatments   ?Labs ?(all labs ordered are listed, but only abnormal results are displayed) ?Labs Reviewed  ?COMPREHENSIVE METABOLIC PANEL - Abnormal; Notable for the following components:  ?    Result Value  ? Glucose, Bld 136 (*)   ? BUN 24 (*)   ? Creatinine, Ser 1.28 (*)   ? All other components within normal limits  ?SALICYLATE LEVEL - Abnormal; Notable for the following components:  ? Salicylate Lvl <7.0 (*)   ? All other components within normal limits  ?ACETAMINOPHEN LEVEL - Abnormal; Notable for the following components:  ? Acetaminophen (Tylenol), Serum <10 (*)   ? All other components within normal limits  ?CBC - Abnormal; Notable for the following  components:  ? WBC 13.4 (*)   ? Platelets 483 (*)   ? All other components within normal limits  ?ETHANOL  ?RAPID URINE DRUG SCREEN, HOSP PERFORMED  ? ? ?EKG ?None ? ?Radiology ?No results found. ? ?Procedures ?Procedures  ? ?Medications Ordered in ED ?Medications  ?LORazepam (ATIVAN) injection 1 mg (1 mg Intravenous Given 05/24/21 1159)  ? ? ?ED Course/ Medical Decision Making/ A&P ?  ?                        ?Medical Decision Making ?Amount and/or Complexity of Data Reviewed ?Labs: ordered. ? ?Risk ?Prescription drug management. ? ? ?51 year old male presenting with AMS.  Per chart review  he has a history of opioid-induced mood disorder.  In 04/2020 patient presented with SI and large laceration to his arm. ? ?Work-up: Medical clearance reviewed by me.  Patient with a creatinine of 1.28, nonconcerning.  Vital signs stable.  Medically clear at this time ? ?I am unable to get any history out of him due to his psychosis.  We will medically clear him for TTS evaluation.  Patient is not aggressive, denying SI/HI.  He will be treated with Ativan and remain voluntary at this time. ? ?Final Clinical Impression(s) / ED Diagnoses ?Final diagnoses:  ?Psychosis, unspecified psychosis type (HCC)  ? ? ?Rx / DC Orders ?Medically cleared at this time.  TTS to dispo. ?  ?Saddie Benders, PA-C ?05/24/21 1259 ? ?  ?Mancel Bale, MD ?05/24/21 1610 ? ?

## 2021-05-24 NOTE — ED Notes (Addendum)
Pt continues to ask for phone.  Pt informed that he cannot have phone by ed rn.  Pt able to hear phone ringing in cupboard, unhooked himself from monitor, climbed over ed stretcher railing, pushed ED RN out of way and walked behind nursing station and got his cell phone out of cupboard. Pt ambulated back to ed stretcher w/ personal phone. Security called by ed staff.  Security presents to bedside to help with verbal de-escalation of pt.  Pt refusing to hand over phone.  Pt states "I want to call my son and then I will give it back".  ED RN and security agree to let pt call son and then attempt to get phone from pt.  ?

## 2021-05-24 NOTE — ED Notes (Signed)
Pt acting out at this time. Pt threatening to leave.  Pt out of bed. Ambulated to cupboard where belongings are stored.  Pt grabbed belongings and back to bed w/ belongings.  Security to bedside to try and de-escalate pt and obtain belongings from pt.  Security able to obtain belongings from pt and belonging bags replaced in cupboard.   ?

## 2021-05-24 NOTE — ED Notes (Signed)
Patient provided with apple juice.  Continues to remain calm and cooperative ?

## 2021-05-24 NOTE — ED Notes (Signed)
Patient provided with apple and grape juice per requesting.  Patient is calm and resting quietly on stretcher at this time.  Will continue to monitor  ?

## 2021-05-25 DIAGNOSIS — F1999 Other psychoactive substance use, unspecified with unspecified psychoactive substance-induced disorder: Secondary | ICD-10-CM

## 2021-05-25 DIAGNOSIS — F1410 Cocaine abuse, uncomplicated: Secondary | ICD-10-CM | POA: Diagnosis present

## 2021-05-25 NOTE — ED Notes (Addendum)
Patient given discharge instructions and encouraged to follow up with [facility]. Patient reports he has no way home and requested the hospital to call him an [place]. No bus passes in the department, encouraged to call a ride. Security at bedside to walk patient to the lobby.  ?

## 2021-05-25 NOTE — ED Notes (Signed)
Patient remains asleep.  Respirations even and unlabored.  Will intermittently wake and ask about "why am I still here."  Reassurance provided and patient returns to sleep  ?

## 2021-05-25 NOTE — Consult Note (Signed)
Telepsych Consultation  ? ?Reason for Consult:  psychotic, AMS ?Referring Physician:  Georgeanna Lea PA-C ?Location of Patient: [facility] ?Location of Provider: [facility] TTS Department ? ?Patient Identification: Todd [name] ?MRN:  [id number] ?Principal Diagnosis: Substance-induced disorder (HCC) ?Diagnosis:  Principal Problem: ?  Substance-induced disorder (HCC) ?Active Problems: ?  Altered mental status ?  Psychosis (HCC) ?  Cocaine use disorder (HCC) ? ? ?Total Time spent with patient: 20 minutes ? ?Subjective:   ?Todd [name] is a 51 y.o. male patient admitted with psychosis. ? ?Patient presents laying in bed. "My buddy dropped me off because for some reason he thinks I'm all screwed up in the head. I was just trying to get ahold of these people on Messenger to let them know where I was". States he currently lives [address] in [place] with his previously stated buddy in his shed. He currently receives disability related to physical injury. He is alert and oriented to person, place, and situation; partial to time. He denies any illicit substance abuse; provider discussed his positive UDS and possible effects on mental health. Patient was receptive; says he was previously receiving care from [facility] but was discharged last July. He requested pain medication in which provider directed him to seek from his primary care. He denies any suicidal or homicidal ideations, auditory or visual hallucinations. He appears calm and cooperative, answers questions appropriately. Substance induced disorder cannot be ruled out.  ? ?HPI:  Todd [name] is a 51 year old male patient with past psychiatric history of altered mental status, polysubstance abuse, psychosis who presented to [facility] voluntarily after allegedly being dropped off by a friend because he "died". Patient was noted to be rambling, seeing things that weren't there, and escalating. UDS+cocaine, BAL<10. PDMP reviewed, Testosterone CY 2,000 mg filled  05/07/21.  ? ?Past Psychiatric History: altered mental status, polysubstance abuse, psychosis ? ?Risk to Self:   ?Risk to Others:   ?Prior Inpatient Therapy:   ?Prior Outpatient Therapy:   ? ?Past Medical History:  ?Past Medical History:  ?Diagnosis Date  ? Drug addiction (HCC)   ? 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 GERD (gastroesophageal reflux disease)   ? H/O hiatal hernia   ? Hypertension   ?  ?Past Surgical History:  ?Procedure Laterality Date  ? APPENDECTOMY    ? as child  ? COLONOSCOPY WITH PROPOFOL  02/17/2012  ? Procedure: COLONOSCOPY WITH PROPOFOL;  Surgeon: Willis Modena, MD;  Location: [facility] ENDOSCOPY;  Service: Endoscopy;  Laterality: N/A;  ? ESOPHAGOGASTRODUODENOSCOPY Left 09/19/2013  ? Procedure: ESOPHAGOGASTRODUODENOSCOPY (EGD);  Surgeon: Willis Modena, MD;  Location: [facility] ENDOSCOPY;  Service: Endoscopy;  Laterality: Left;  ? ESOPHAGOGASTRODUODENOSCOPY (EGD) WITH PROPOFOL  02/17/2012  ? Procedure: ESOPHAGOGASTRODUODENOSCOPY (EGD) WITH PROPOFOL;  Surgeon: Willis Modena, MD;  Location: [facility] ENDOSCOPY;  Service: Endoscopy;  Laterality: N/A;  ? HIP ARTHROPLASTY Right   ? HIP CLOSED REDUCTION Right 10/20/2013  ? Procedure: CLOSED REDUCTION HIP;  Surgeon: Eldred Manges, MD;  Location[facility] OR;  Service: Orthopedics;  Laterality: Right;  ? HIP CLOSED REDUCTION Right 02/18/2015  ? Procedure: CLOSED REDUCTION HIP;  Surgeon: Jodi Geralds, MD;  Location: [facility] ORS;  Service: Orthopedics;  Laterality: Right;  ? HIP CLOSED REDUCTION Right 03/16/2015  ? Procedure: CLOSED REDUCTION HIP;  Surgeon: Kathryne Hitch, MD;  Location: [facility] OR;  Service: Orthopedics;  Laterality: Right;  ? HIP CLOSED REDUCTION Right 04/29/2019  ? Procedure: CLOSED MANIPULATION HIP;  Surgeon: Teryl Lucy, MD;  Location: [facility] ORS;  Service: Orthopedics;  Laterality: Right;  ? HIP CLOSED REDUCTION Right 05/14/2019  ? Procedure: CLOSED REDUCTION HIP;  Surgeon: Deeann Saint, MD;  Location: [facility] ORS;   Service: Orthopedics;  Laterality: Right;  ? HIP CLOSED REDUCTION Right 05/22/2019  ? Procedure: CLOSED MANIPULATION HIP;  Surgeon: Teryl Lucy, MD;  Location: [facility] ORS;  Service: Orthopedics;  Laterality: Right;  ? JOINT REPLACEMENT  2005  ? Right Hip  ? ?Family History:  ?Family History  ?Problem Relation Age of Onset  ? Diabetes Mother   ? ?Family Psychiatric  History: not noted ?Social History:  ?Social History  ? ?Substance and Sexual Activity  ?Alcohol Use No  ?   ?Social History  ? ?Substance and Sexual Activity  ?Drug Use Not Currently  ? Types: IV  ?  ?Social History  ? ?Socioeconomic History  ? Marital status: Married  ?  Spouse name: Not on file  ? Number of children: Not on file  ? Years of education: Not on file  ? Highest education level: Not on file  ?Occupational History  ? Not on file  ?Tobacco Use  ? Smoking status: Never  ? Smokeless tobacco: Never  ?Vaping Use  ? Vaping Use: Never used  ?Substance and Sexual Activity  ? Alcohol use: No  ? Drug use: Not Currently  ?  Types: IV  ? Sexual activity: Yes  ?Other Topics Concern  ? Not on file  ?Social History Narrative  ? ** Merged History Encounter **  ?    ? ?Social Determinants of Health  ? ?Financial Resource Strain: Not on file  ?Food Insecurity: Not on file  ?Transportation Needs: Not on file  ?Physical Activity: Not on file  ?Stress: Not on file  ?Social Connections: Not on file  ? ?Additional Social History: ?  ? ?Allergies:   ?Allergies  ?Allergen Reactions  ? Ambien [Zolpidem Tartrate] Other (See Comments)  ?  Sedation   ? Nu-Iron [Polysaccharide Iron Complex] Diarrhea  ? Fentanyl Other (See Comments)  ?  Patches caused headache  ? ? ?Labs:  ?Results for orders placed or performed during the hospital encounter of 05/24/21 (from the past 48 hour(s))  ?Comprehensive metabolic panel     Status: Abnormal  ? Collection Time: 05/24/21 10:32 AM  ?Result Value Ref Range  ? Sodium 138 135 - 145 mmol/L  ? Potassium 3.7 3.5 - 5.1 mmol/L  ? Chloride 104  98 - 111 mmol/L  ? CO2 22 22 - 32 mmol/L  ? Glucose, Bld 136 (H) 70 - 99 mg/dL  ?  Comment: Glucose reference range applies only to samples taken after fasting for at least 8 hours.  ? BUN 24 (H) 6 - 20 mg/dL  ? Creatinine, Ser 1.28 (H) 0.61 - 1.24 mg/dL  ? Calcium 9.2 8.9 - 10.3 mg/dL  ? Total Protein 8.0 6.5 - 8.1 g/dL  ? Albumin 4.4 3.5 - 5.0 g/dL  ? AST 16 15 - 41 U/L  ? ALT 18 0 - 44 U/L  ? Alkaline Phosphatase 90 38 - 126 U/L  ? Total Bilirubin 1.1 0.3 - 1.2 mg/dL  ? GFR, Estimated >60 >60 mL/min  ?  Comment: (NOTE) ?Calculated using the CKD-EPI Creatinine Equation (2021) ?  ? Anion gap 12 5 - 15  ?  Comment: Performed at [facility], [address] [place], [place] [address]  ?Ethanol     Status: None  ? Collection Time:  05/24/21 10:32 AM  ?Result Value Ref Range  ? Alcohol, Ethyl (B) <10 <10 mg/dL  ?  Comment: (NOTE) ?Lowest detectable limit for serum alcohol is 10 mg/dL. ? ?For medical purposes only. ?Performed at [facility], [address] ?[place], [place] [address] ?  ?Salicylate level     Status: Abnormal  ? Collection Time: 05/24/21 10:32 AM  ?Result Value Ref Range  ? Salicylate Lvl <7.0 (L) 7.0 - 30.0 mg/dL  ?  Comment: Performed at [facility], [address] [place], [place] [address]  ?Acetaminophen level     Status: Abnormal  ? Collection Time: 05/24/21 10:32 AM  ?Result Value Ref Range  ? Acetaminophen (Tylenol), Serum <10 (L) 10 - 30 ug/mL  ?  Comment: (NOTE) ?Therapeutic concentrations vary significantly. A range of 10-30 ug/mL  ?may be an effective concentration for many patients. However, some  ?are best treated at concentrations outside of this range. ?Acetaminophen concentrations >150 ug/mL at 4 hours after ingestion  ?and >50 ug/mL at 12 hours after ingestion are often associated with  ?toxic reactions. ? ?Performed at [facility], [address] ?[place], [place] [address] ?  ?cbc     Status: Abnormal  ?  Collection Time: 05/24/21 10:32 AM  ?Result Value Ref Range  ? WBC 13.4 (H) 4.0 - 10.5 K/uL  ? RBC 5.79 4.22 - 5.81 MIL/uL  ? Hemoglobin 16.6 13.0 - 17.0 g/dL  ? HCT 50.1 39.0 - 52.0 %  ? MCV 86.5 80.0 -

## 2021-05-25 NOTE — ED Provider Notes (Signed)
Emergency Medicine Observation Re-evaluation Note ? ?Todd [name] is a 51 y.o. male, seen on rounds today.  Pt initially presented to the ED for complaints of Medical Clearance ?Currently, the patient is resting comfortably. ? ?Physical Exam  ?BP 97/64   Pulse 90   Temp 99 ?F (37.2 ?C) (Oral)   Resp 18   Ht 6\' 2"  (1.88 m)   Wt 68 kg   SpO2 100%   BMI 19.26 kg/m?  ?Physical Exam ?General: Nontoxic appearing ?Cardiac: Normal heart rate ?Lungs: Normal respiratory rate ?Psych: No internal responsiveness ? ?ED Course / MDM  ?EKG:  ? ?I have reviewed the labs performed to date as well as medications administered while in observation.  Recent changes in the last 24 hours include he has remained cooperative and has been seen by TTS. ? ?Plan  ?Current plan is for discharge. ?Todd [name] is under involuntary commitment. ?  ? ?  ?Todd Hight, MD ?05/25/21 1354 ? ?

## 2021-05-25 NOTE — ED Notes (Signed)
Pt states he will fight staff if he cannot have his phone. Is currently arguing with security. ?

## 2021-05-25 NOTE — Discharge Instructions (Signed)
Follow-up at the behavioral health urgent care, or with your psychiatric provider as needed for problems. ?

## 2023-03-11 DIAGNOSIS — E785 Hyperlipidemia, unspecified: Secondary | ICD-10-CM | POA: Diagnosis not present

## 2023-03-11 DIAGNOSIS — I10 Essential (primary) hypertension: Secondary | ICD-10-CM | POA: Diagnosis not present

## 2023-03-11 DIAGNOSIS — Z008 Encounter for other general examination: Secondary | ICD-10-CM | POA: Diagnosis not present

## 2023-04-01 DIAGNOSIS — Z1331 Encounter for screening for depression: Secondary | ICD-10-CM | POA: Diagnosis not present

## 2023-04-01 DIAGNOSIS — F1120 Opioid dependence, uncomplicated: Secondary | ICD-10-CM | POA: Diagnosis not present

## 2023-04-01 DIAGNOSIS — G8929 Other chronic pain: Secondary | ICD-10-CM | POA: Diagnosis not present

## 2023-04-01 DIAGNOSIS — F411 Generalized anxiety disorder: Secondary | ICD-10-CM | POA: Diagnosis not present

## 2023-04-01 DIAGNOSIS — F5102 Adjustment insomnia: Secondary | ICD-10-CM | POA: Diagnosis not present

## 2023-04-29 DIAGNOSIS — Z1331 Encounter for screening for depression: Secondary | ICD-10-CM | POA: Diagnosis not present

## 2023-04-29 DIAGNOSIS — F1510 Other stimulant abuse, uncomplicated: Secondary | ICD-10-CM | POA: Diagnosis not present

## 2023-04-29 DIAGNOSIS — F411 Generalized anxiety disorder: Secondary | ICD-10-CM | POA: Diagnosis not present

## 2023-04-29 DIAGNOSIS — F1120 Opioid dependence, uncomplicated: Secondary | ICD-10-CM | POA: Diagnosis not present

## 2023-04-29 DIAGNOSIS — F5102 Adjustment insomnia: Secondary | ICD-10-CM | POA: Diagnosis not present

## 2023-04-29 DIAGNOSIS — G8929 Other chronic pain: Secondary | ICD-10-CM | POA: Diagnosis not present

## 2023-05-27 DIAGNOSIS — F1121 Opioid dependence, in remission: Secondary | ICD-10-CM | POA: Diagnosis not present

## 2023-05-27 DIAGNOSIS — F411 Generalized anxiety disorder: Secondary | ICD-10-CM | POA: Diagnosis not present

## 2023-05-27 DIAGNOSIS — F5102 Adjustment insomnia: Secondary | ICD-10-CM | POA: Diagnosis not present

## 2023-10-04 DIAGNOSIS — N528 Other male erectile dysfunction: Secondary | ICD-10-CM | POA: Diagnosis not present

## 2023-10-04 DIAGNOSIS — E291 Testicular hypofunction: Secondary | ICD-10-CM | POA: Diagnosis not present
